# Patient Record
Sex: Female | Born: 1955 | Race: White | Hispanic: No | Smoking: Former smoker
Health system: Southern US, Community
[De-identification: ages and names within clinical notes are randomized; demographics above are authoritative.]

## PROBLEM LIST (undated history)

## (undated) DIAGNOSIS — I10 Essential (primary) hypertension: Secondary | ICD-10-CM

## (undated) DIAGNOSIS — K219 Gastro-esophageal reflux disease without esophagitis: Secondary | ICD-10-CM

## (undated) DIAGNOSIS — E538 Deficiency of other specified B group vitamins: Secondary | ICD-10-CM

## (undated) DIAGNOSIS — I5181 Takotsubo syndrome: Secondary | ICD-10-CM

## (undated) DIAGNOSIS — C539 Malignant neoplasm of cervix uteri, unspecified: Secondary | ICD-10-CM

## (undated) DIAGNOSIS — M1990 Unspecified osteoarthritis, unspecified site: Secondary | ICD-10-CM

## (undated) DIAGNOSIS — G43909 Migraine, unspecified, not intractable, without status migrainosus: Secondary | ICD-10-CM

## (undated) DIAGNOSIS — F419 Anxiety disorder, unspecified: Secondary | ICD-10-CM

## (undated) DIAGNOSIS — K5090 Crohn's disease, unspecified, without complications: Secondary | ICD-10-CM

## (undated) DIAGNOSIS — F329 Major depressive disorder, single episode, unspecified: Secondary | ICD-10-CM

## (undated) DIAGNOSIS — G459 Transient cerebral ischemic attack, unspecified: Secondary | ICD-10-CM

## (undated) DIAGNOSIS — F32A Depression, unspecified: Secondary | ICD-10-CM

## (undated) DIAGNOSIS — A630 Anogenital (venereal) warts: Secondary | ICD-10-CM

## (undated) DIAGNOSIS — L409 Psoriasis, unspecified: Secondary | ICD-10-CM

## (undated) DIAGNOSIS — K760 Fatty (change of) liver, not elsewhere classified: Secondary | ICD-10-CM

## (undated) DIAGNOSIS — Z8619 Personal history of other infectious and parasitic diseases: Secondary | ICD-10-CM

## (undated) DIAGNOSIS — Z720 Tobacco use: Secondary | ICD-10-CM

## (undated) DIAGNOSIS — Z862 Personal history of diseases of the blood and blood-forming organs and certain disorders involving the immune mechanism: Secondary | ICD-10-CM

## (undated) DIAGNOSIS — H903 Sensorineural hearing loss, bilateral: Secondary | ICD-10-CM

## (undated) HISTORY — DX: Transient cerebral ischemic attack, unspecified: G45.9

## (undated) HISTORY — DX: Unspecified osteoarthritis, unspecified site: M19.90

## (undated) HISTORY — DX: Anxiety disorder, unspecified: F41.9

## (undated) HISTORY — DX: Anogenital (venereal) warts: A63.0

## (undated) HISTORY — DX: Psoriasis, unspecified: L40.9

## (undated) HISTORY — DX: Personal history of other infectious and parasitic diseases: Z86.19

## (undated) HISTORY — DX: Sensorineural hearing loss, bilateral: H90.3

## (undated) HISTORY — DX: Malignant neoplasm of cervix uteri, unspecified: C53.9

## (undated) HISTORY — DX: Major depressive disorder, single episode, unspecified: F32.9

## (undated) HISTORY — DX: Deficiency of other specified B group vitamins: E53.8

## (undated) HISTORY — DX: Essential (primary) hypertension: I10

## (undated) HISTORY — DX: Migraine, unspecified, not intractable, without status migrainosus: G43.909

## (undated) HISTORY — DX: Fatty (change of) liver, not elsewhere classified: K76.0

## (undated) HISTORY — DX: Gastro-esophageal reflux disease without esophagitis: K21.9

## (undated) HISTORY — DX: Takotsubo syndrome: I51.81

## (undated) HISTORY — PX: KNEE SURGERY: SHX244

## (undated) HISTORY — DX: Personal history of diseases of the blood and blood-forming organs and certain disorders involving the immune mechanism: Z86.2

## (undated) HISTORY — DX: Depression, unspecified: F32.A

## (undated) HISTORY — DX: Crohn's disease, unspecified, without complications: K50.90

---

## 1996-08-29 HISTORY — PX: APPENDECTOMY: SHX54

## 1998-08-29 DIAGNOSIS — K5090 Crohn's disease, unspecified, without complications: Secondary | ICD-10-CM

## 1998-08-29 HISTORY — PX: RIGHT COLECTOMY: SHX853

## 1998-08-29 HISTORY — DX: Crohn's disease, unspecified, without complications: K50.90

## 2006-06-21 ENCOUNTER — Emergency Department (HOSPITAL_COMMUNITY): Admission: EM | Admit: 2006-06-21 | Discharge: 2006-06-22 | Payer: Self-pay | Admitting: Emergency Medicine

## 2007-08-10 ENCOUNTER — Encounter: Admission: RE | Admit: 2007-08-10 | Discharge: 2007-08-10 | Payer: Self-pay | Admitting: Gastroenterology

## 2007-09-30 ENCOUNTER — Emergency Department (HOSPITAL_COMMUNITY): Admission: EM | Admit: 2007-09-30 | Discharge: 2007-09-30 | Payer: Self-pay | Admitting: Emergency Medicine

## 2007-10-04 ENCOUNTER — Encounter: Admission: RE | Admit: 2007-10-04 | Discharge: 2007-10-04 | Payer: Self-pay | Admitting: Gastroenterology

## 2007-12-31 ENCOUNTER — Inpatient Hospital Stay (HOSPITAL_COMMUNITY): Admission: EM | Admit: 2007-12-31 | Discharge: 2008-01-06 | Payer: Self-pay | Admitting: Emergency Medicine

## 2008-04-03 ENCOUNTER — Encounter: Admission: RE | Admit: 2008-04-03 | Discharge: 2008-04-03 | Payer: Self-pay | Admitting: Family Medicine

## 2008-10-27 HISTORY — PX: COLONOSCOPY: SHX174

## 2008-10-27 HISTORY — PX: CERVICAL BIOPSY  W/ LOOP ELECTRODE EXCISION: SUR135

## 2008-11-20 ENCOUNTER — Ambulatory Visit: Payer: Self-pay | Admitting: Gynecology

## 2008-11-26 ENCOUNTER — Ambulatory Visit: Payer: Self-pay | Admitting: Gynecology

## 2008-11-26 ENCOUNTER — Encounter: Payer: Self-pay | Admitting: Gynecology

## 2008-12-09 ENCOUNTER — Ambulatory Visit: Payer: Self-pay | Admitting: Gynecology

## 2008-12-23 ENCOUNTER — Ambulatory Visit: Payer: Self-pay | Admitting: Gynecology

## 2008-12-26 ENCOUNTER — Ambulatory Visit: Payer: Self-pay | Admitting: Gynecology

## 2008-12-27 DIAGNOSIS — C539 Malignant neoplasm of cervix uteri, unspecified: Secondary | ICD-10-CM

## 2008-12-27 HISTORY — PX: VAGINAL HYSTERECTOMY: SUR661

## 2008-12-27 HISTORY — DX: Malignant neoplasm of cervix uteri, unspecified: C53.9

## 2009-01-02 ENCOUNTER — Ambulatory Visit: Payer: Self-pay | Admitting: Gynecology

## 2009-01-06 ENCOUNTER — Ambulatory Visit: Payer: Self-pay | Admitting: Gynecology

## 2009-01-06 ENCOUNTER — Encounter: Payer: Self-pay | Admitting: Gynecology

## 2009-01-06 ENCOUNTER — Ambulatory Visit (HOSPITAL_BASED_OUTPATIENT_CLINIC_OR_DEPARTMENT_OTHER): Admission: RE | Admit: 2009-01-06 | Discharge: 2009-01-07 | Payer: Self-pay | Admitting: Gynecology

## 2009-01-21 ENCOUNTER — Ambulatory Visit: Payer: Self-pay | Admitting: Gynecology

## 2009-02-19 ENCOUNTER — Ambulatory Visit: Payer: Self-pay | Admitting: Gynecology

## 2009-03-17 ENCOUNTER — Ambulatory Visit: Payer: Self-pay | Admitting: Gynecology

## 2009-05-20 ENCOUNTER — Encounter: Admission: RE | Admit: 2009-05-20 | Discharge: 2009-05-20 | Payer: Self-pay | Admitting: Internal Medicine

## 2009-09-21 ENCOUNTER — Emergency Department (HOSPITAL_COMMUNITY): Admission: EM | Admit: 2009-09-21 | Discharge: 2009-09-21 | Payer: Self-pay | Admitting: Family Medicine

## 2010-01-08 ENCOUNTER — Ambulatory Visit: Payer: Self-pay | Admitting: Gynecology

## 2010-05-29 DIAGNOSIS — G459 Transient cerebral ischemic attack, unspecified: Secondary | ICD-10-CM

## 2010-05-29 HISTORY — DX: Transient cerebral ischemic attack, unspecified: G45.9

## 2010-05-29 HISTORY — PX: OTHER SURGICAL HISTORY: SHX169

## 2010-05-29 HISTORY — PX: US ECHOCARDIOGRAPHY: HXRAD669

## 2010-06-09 ENCOUNTER — Inpatient Hospital Stay (HOSPITAL_COMMUNITY): Admission: EM | Admit: 2010-06-09 | Discharge: 2010-06-11 | Payer: Self-pay | Admitting: Emergency Medicine

## 2010-06-10 ENCOUNTER — Encounter (INDEPENDENT_AMBULATORY_CARE_PROVIDER_SITE_OTHER): Payer: Self-pay | Admitting: Internal Medicine

## 2010-08-25 ENCOUNTER — Other Ambulatory Visit
Admission: RE | Admit: 2010-08-25 | Discharge: 2010-08-25 | Payer: Self-pay | Source: Home / Self Care | Admitting: Gynecology

## 2010-08-25 ENCOUNTER — Ambulatory Visit: Payer: Self-pay | Admitting: Gynecology

## 2010-09-16 ENCOUNTER — Ambulatory Visit: Admit: 2010-09-16 | Payer: Self-pay | Admitting: Gynecology

## 2010-11-11 LAB — CBC
HCT: 32.7 % — ABNORMAL LOW (ref 36.0–46.0)
Hemoglobin: 11.3 g/dL — ABNORMAL LOW (ref 12.0–15.0)
Hemoglobin: 11.3 g/dL — ABNORMAL LOW (ref 12.0–15.0)
Hemoglobin: 11.3 g/dL — ABNORMAL LOW (ref 12.0–15.0)
MCH: 31.9 pg (ref 26.0–34.0)
MCH: 32.1 pg (ref 26.0–34.0)
MCHC: 33.7 g/dL (ref 30.0–36.0)
MCHC: 34.1 g/dL (ref 30.0–36.0)
MCV: 92.9 fL (ref 78.0–100.0)
Platelets: 146 10*3/uL — ABNORMAL LOW (ref 150–400)
Platelets: 151 10*3/uL (ref 150–400)
Platelets: 167 10*3/uL (ref 150–400)
RBC: 3.45 MIL/uL — ABNORMAL LOW (ref 3.87–5.11)
RBC: 3.52 MIL/uL — ABNORMAL LOW (ref 3.87–5.11)
WBC: 3.9 10*3/uL — ABNORMAL LOW (ref 4.0–10.5)

## 2010-11-11 LAB — RETICULOCYTES: Retic Ct Pct: 0.9 % (ref 0.4–3.1)

## 2010-11-11 LAB — BASIC METABOLIC PANEL
CO2: 23 mEq/L (ref 19–32)
CO2: 26 mEq/L (ref 19–32)
Calcium: 8.3 mg/dL — ABNORMAL LOW (ref 8.4–10.5)
Chloride: 109 mEq/L (ref 96–112)
Chloride: 110 mEq/L (ref 96–112)
Creatinine, Ser: 0.72 mg/dL (ref 0.4–1.2)
Creatinine, Ser: 0.78 mg/dL (ref 0.4–1.2)
GFR calc Af Amer: >60 mL/min (ref >60)
GFR calc non Af Amer: >60 mL/min (ref >60)
Potassium: 3.5 mEq/L (ref 3.5–5.1)
Potassium: 4.1 mEq/L (ref 3.5–5.1)

## 2010-11-11 LAB — COMPREHENSIVE METABOLIC PANEL
ALT: 26 U/L (ref 0–35)
AST: 29 U/L (ref 0–37)
Albumin: 3 g/dL — ABNORMAL LOW (ref 3.5–5.2)
Albumin: 3 g/dL — ABNORMAL LOW (ref 3.5–5.2)
Alkaline Phosphatase: 62 U/L (ref 39–117)
BUN: 6 mg/dL (ref 6–23)
Calcium: 8 mg/dL — ABNORMAL LOW (ref 8.4–10.5)
Creatinine, Ser: 0.69 mg/dL (ref 0.4–1.2)
Creatinine, Ser: 0.7 mg/dL (ref 0.4–1.2)
GFR calc Af Amer: >60 mL/min (ref >60)
Glucose, Bld: 90 mg/dL (ref 70–99)
Sodium: 139 mEq/L (ref 135–145)
Total Bilirubin: 0.6 mg/dL (ref 0.3–1.2)
Total Protein: 5.7 g/dL — ABNORMAL LOW (ref 6.0–8.3)
Total Protein: 5.8 g/dL — ABNORMAL LOW (ref 6.0–8.3)

## 2010-11-11 LAB — TROPONIN I: Troponin I: 0.01 ng/mL (ref 0.00–0.06)

## 2010-11-11 LAB — URINALYSIS, ROUTINE W REFLEX MICROSCOPIC
Glucose, UA: NEGATIVE mg/dL
Hgb urine dipstick: NEGATIVE
Ketones, ur: NEGATIVE mg/dL
Protein, ur: NEGATIVE mg/dL
pH: 7.5 (ref 5.0–8.0)

## 2010-11-11 LAB — IRON AND TIBC
Saturation Ratios: 30 % (ref 20–55)
UIBC: 191 ug/dL

## 2010-11-11 LAB — DIFFERENTIAL
Eosinophils Absolute: 0.1 10*3/uL (ref 0.0–0.7)
Eosinophils Relative: 1 % (ref 0–5)
Lymphocytes Relative: 35 % (ref 12–46)
Lymphs Abs: 1.4 10*3/uL (ref 0.7–4.0)
Monocytes Absolute: 0.4 10*3/uL (ref 0.1–1.0)
Monocytes Relative: 10 % (ref 3–12)

## 2010-11-11 LAB — PROTIME-INR: Prothrombin Time: 14.4 seconds (ref 11.6–15.2)

## 2010-11-11 LAB — LIPID PANEL
LDL Cholesterol: 88 mg/dL (ref 0–99)
Total CHOL/HDL Ratio: 3.1 RATIO
Triglycerides: 65 mg/dL (ref <150)
VLDL: 13 mg/dL (ref 0–40)

## 2010-11-11 LAB — FOLATE: Folate: 15.5 ng/mL

## 2010-11-11 LAB — CK TOTAL AND CKMB (NOT AT ARMC)
CK, MB: 1.1 ng/mL (ref 0.3–4.0)
Total CK: 88 U/L (ref 7–177)

## 2010-11-11 LAB — HEMOGLOBIN A1C
Hgb A1c MFr Bld: 5.2 % (ref <5.7)
Mean Plasma Glucose: 103 mg/dL (ref <117)

## 2010-11-11 LAB — VITAMIN B12: Vitamin B-12: 634 pg/mL (ref 211–911)

## 2010-11-11 LAB — FERRITIN: Ferritin: 31 ng/mL (ref 10–291)

## 2010-11-11 LAB — MAGNESIUM: Magnesium: 1.7 mg/dL (ref 1.5–2.5)

## 2010-11-11 LAB — APTT: aPTT: 32 seconds (ref 24–37)

## 2010-11-11 LAB — HEMOCCULT GUIAC POC 1CARD (OFFICE): Fecal Occult Bld: NEGATIVE

## 2010-12-07 LAB — CBC
MCV: 100.8 fL — ABNORMAL HIGH (ref 78.0–100.0)
RBC: 3 MIL/uL — ABNORMAL LOW (ref 3.87–5.11)
WBC: 3.8 10*3/uL — ABNORMAL LOW (ref 4.0–10.5)

## 2010-12-07 LAB — DIFFERENTIAL
Eosinophils Absolute: 0 10*3/uL (ref 0.0–0.7)
Lymphocytes Relative: 24 % (ref 12–46)
Lymphs Abs: 0.9 10*3/uL (ref 0.7–4.0)
Monocytes Relative: 7 % (ref 3–12)
Neutrophils Relative %: 68 % (ref 43–77)

## 2011-01-10 ENCOUNTER — Other Ambulatory Visit: Payer: Self-pay | Admitting: Gastroenterology

## 2011-01-10 DIAGNOSIS — K5090 Crohn's disease, unspecified, without complications: Secondary | ICD-10-CM

## 2011-01-11 NOTE — Discharge Summary (Signed)
Sharon [name], Sharon [name]              ACCOUNT NO.:  [id number]   MEDICAL RECORD NO.:  [id number]          PATIENT TYPE:  INP   LOCATION:  2703                         FACILITY:  [facility]   PHYSICIAN:  Barbette Merino, M.D.      DATE OF BIRTH:  1956-08-13   DATE OF ADMISSION:  12/31/2007  DATE OF DISCHARGE:  01/06/2008                               DISCHARGE SUMMARY   PRIMARY CARE PHYSICIAN:  Chipper Herb, MD   GASTROENTEROLOGIST:  Joyice Faster. [name], MD   DISCHARGE DIAGNOSES:  1. Crohn disease exacerbation.  2. Hypertension.  3. Anxiety disorder.  4. Depression.  5. Gastroesophageal reflux disease.  6. Transient hypoglycemia, steroid induced.   DISCHARGE MEDICATIONS:  1. Asacol 1200 mg p.o. b.i.d.  2. Celexa 40 mg daily.  3. Prevacid 30 mg daily.  4. Entocort 9 mg tablet, 1 tablet daily.  5. 6-mercaptopurine 1/2 tablets daily.  6. MiraLax 17 grams in 8 ounces of water daily.  7. Ativan 0.5 mg q.8 h. p.r.n.   DISPOSITION:  The patient will be discharged home.  She is to follow  with Dr. Laurence Spates as an outpatient and further treatment will be  instituted then.   PROCEDURES PERFORMED:  An abdominal x-ray performed on May 4 shows no  active cardiopulmonary disease, abdominal gas pattern most consistent  with nonobstructive ileus.  CT abdomen and pelvis performed on May 4  consistent with wall thickening involving the distal small bowel and  duodenal bulb findings, probably representing an exacerbation of Crohn  disease but no evidence for obstruction.  There is a tiny amount of  fluid in the right lower quadrant of the abdomen.  A small-bowel follow-  through on Jan 04, 2008 showed findings consistent with Crohn disease  involving distal small bowel without fistula identified.  A KUB  performed on May 9 showed contrast administered prior to this all the  way to the rectum.  An EGD performed by Dr. Teena Irani on Jan 02, 2008  showed a normal study.   CONSULTATIONS:  John C.  Amedeo Plenty, MD, gastroenterologist.   BRIEF HISTORY AND PHYSICAL:  Please refer the dictated history and  physical on admission by Dr. Donia Ast.  In short, however, this is a  55 year old female with known history of Crohn disease status post  resection.  She is being followed by Tanna Furry given with abdominal  pain, nausea, and vomiting for about a day.  The patient was sent to the  ED.  At that time, her blood pressure was 171/86.  She has guaiac  positive stools but her hemoglobin was 10.4.  Otherwise, no other  significant findings.   HOSPITAL COURSE:  1. Acute flare of Crohn's.  The patient's evaluation was consistent      with Crohn disease exacerbation.  She was started on high-dose IV      steroids and studies were conducted as indicated above.      Ultimately, the diagnosis of Crohn's flare was made.  She improved      on IV steroids.  Later on, she was titrated and switched back  to      oral steroids and Entocort which she will continue on.  Other      medications included 6-mercaptopurine that were continued as well.  2. Constipation.  The patient had transient constipation after her      Crohn's flare was resolved.  Currently, that has resolved.  3. Hypertension.  Blood pressure has improved on home regimen      therapies.  She will probably require further treatment as an      outpatient, may be with an ACE inhibitor.  4. Hypoglycemia.  This was transient also after IV steroids were      withdrawn.  The patient's sugar is currently stable.  Otherwise,      the patient is doing okay and we will proceed with discharge home.      Barbette Merino, M.D.  Electronically Signed     LG/MEDQ  D:  01/06/2008  T:  01/06/2008  Job:  [id number]

## 2011-01-11 NOTE — H&P (Signed)
Sharon [name], Sharon [name]              ACCOUNT NO.:  [id number]   MEDICAL RECORD NO.:  [id number]          PATIENT TYPE:  AMB   LOCATION:  [facility]                         FACILITY:  [facility]   PHYSICIAN:  Juan H. Toney Rakes, M.D.DATE OF BIRTH:  1956/08/01   DATE OF ADMISSION:  01/06/2009  DATE OF DISCHARGE:                              HISTORY & PHYSICAL   The patient is scheduled for surgery Tuesday, May 11th, at 7:30 a.m., at  [facility]   CHIEF COMPLAINT:  1. Postmenopausal bleeding.  2. 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   HISTORY:  The patient is a 55 year old gravida 4, para 3, AB 1 who was  evaluated because of an abnormal Pap smear that had been done in her  family nurse practitioner's office whereby her Pap smear had  demonstrated atypical squamous cells of undetermined significance.  Her  Pap smear had been repeated on November 03, 2008, demonstrated low-grade  squamous epithelial lesion with HPV viral changes.  The patient had  subsequently undergone a colposcopic evaluation in the office on March  25th with no gross lesion noted, an ECC had demonstrated atypical  squamous cells and endocervical cells with acute inflammation.  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  The patient was quite anxious as a result of this high-grade dysplasia  extending into the cervical canal and also a few days later had  postmenopausal bleeding and underwent an endometrial biopsy which  demonstrated benign endometrium with extensive breakdown and difficult  to evaluate for the presence of hyperplasia.  The patient is scheduled  to undergo a  laparoscopic-assisted hysterectomy with bilateral salpingo-  oophorectomy and the laparoscopic approach is being undertaken due to  the fact that she has a history of regional enteritis of the small  intestine, with large intestine as well, and has a history of pernicious  anemia.  She also has had Crohn's disease and resulting in ileocecal  resection and had been followed by her gastroenterologist, Dr. [name],  who had performed a colonoscopy in March 2010.  He had noted on his  report that the anastomotic site looked a bit inflamed but biopsies that  were undertaken had shown no evidence of active disease.  So, because of  her history of bowel resection and appendectomy in the past and history  of Crohn's disease, this is the reason that we are proceeding with a  laparoscopic-assisted vaginal hysterectomy and bilateral salpingo-  oophorectomy.   PAST MEDICAL HISTORY:  1. She is allergic to:      a.     COD.      b.     Flagyl.  2. She has a history of herpes type 2.  3. She had history of CIN-1, CIN-2.  4. Crohn's disease.   SURGERIES:  Have consisted of:  1. Appendectomy  in 1988.  2. Bowel resection in 2000.  3. LEEP cervical conization in March 2010.   MEDICATIONS:  Consist of Asacol, Celexa, Entocort, lorazepam, Valtrex,  Ambien p.r.n. and 6-MP.   FAMILY HISTORY:  Mother with hypertension and sister with either ovarian  or uterine cancer reported.   PHYSICAL EXAMINATION:  The patient weighs 134 pounds, she is 5 feet 2  inches tall.  Blood pressure 130/80.  HEENT:  Unremarkable.  NECK:  Supple.  Trachea midline.  No carotid bruits, no thyromegaly.  LUNGS:  Clear to auscultation.  No rhonchi or wheezes.  HEART:  Regular rate and rhythm.  No murmurs or gallop.  BREASTS;  Done March of this year with no palpable masses or tenderness,  there was no supraclavicular or axillary lymphadenopathy.  ABDOMEN:  Soft, nontender.  No rebound or guarding.  PELVIC:  Bartholin, urethra,  Skene's within normal limits.  VAGINA/CERVIX:  No gross lesions on inspection.  Evidence of prior  cervical conization was noted.  Uterus anteverted, upper limits of  normal.  Adnexa without any palpable masses or tenderness.  RECTAL:  Deferred.   ASSESSMENT:  A 55 year old gravida 4, para 3, AB 1 with postmenopausal  bleeding and high-grade dysplasia extending into the underlying  endocervical canal.  The patient with a history of Crohn's disease,  history of bowel resection in the past and appendectomy.  Scheduled to  undergo a laparoscopic-assisted hysterectomy with bilateral salpingo-  oophorectomy.  The risks, benefits and pros and cons of the operation  were discussed with the patient.  She will have PSA stockings for deep  vein thrombosis prophylaxis.  We discussed the risk of infection and for  this reason she will have prophylaxis antibiotic as well.  Also, there  is risk of trauma or injury to internal organs requiring open laparotomy  and emergency exploration and correction of any injury to the bowel,  bladder or blood vessels.  Also in the event of any technical difficulty  that the operation cannot be able to be completed laparoscopically, we  will proceed then with an open abdominal approach.  The patient is fully  aware of all of these risks in the event of any uncontrollable  hemorrhage, if she were to need blood or blood products.  She is fully  aware also of the risk of anaphylactic reaction, hepatitis and AIDS from  donor blood.  All of these issues were discussed in detail with the  patient, all questions were answered and will follow accordingly.   The patient is scheduled for laparoscopic-assisted vaginal hysterectomy  Tuesday, May 11th, at 7:30 a.m., at [facility]. Please  have history and physical available.      Juan H. Toney Rakes, M.D.  Electronically Signed     JHF/MEDQ  D:  01/05/2009  T:  01/05/2009  Job:  [id number]

## 2011-01-11 NOTE — Op Note (Signed)
Sharon [name], Sharon [name]              ACCOUNT NO.:  [id number]   MEDICAL RECORD NO.:  [id number]          PATIENT TYPE:  INP   LOCATION:  5531                         FACILITY:  [facility]   PHYSICIAN:  John C. Amedeo Plenty, M.D.    DATE OF BIRTH:  1956/07/30   DATE OF PROCEDURE:  01/02/2008  DATE OF DISCHARGE:                               OPERATIVE REPORT   INDICATIONS FOR PROCEDURE:  This is a patient with Crohn disease who has  had a flare of abdominal pain, nausea, vomiting, diarrhea with CT scan  showing thickened duodenum as well as some distal small bowel  thickening.  She has used considerable amounts of nonsteroidal anti-  inflammatory drugs in the past.   PROCEDURE:  The patient was placed in the left lateral decubitus  position and placed on the pulse monitor with continuous low-flow oxygen  delivered via nasal cannula.  She was sedated with 100 mcg IV fentanyl  and 10 mg IV Versed.  The Olympus video endoscope was advanced under  direct vision into the oropharynx and esophagus.  The esophagus was  straight of normal caliber with squamocolumnar line at 38 cm.  There was  no visible hiatal hernia, ring, stricture, or other abnormalities of the  GE junction.  The stomach was entered and a small amount of liquid  secretions were suctioned from the fundus.  Retroflex view of the cardia  was unremarkable.  The fundus, body, antrum, and pylorus all appeared  normal.  The duodenum was entered.  Both bulb and second portion well  inspected and appeared to be within normal limits.  Scope was then  withdrawn and the patient returned to the recovery room in stable  condition.  She tolerated the procedure well.  There were no immediate  complications.   IMPRESSION:  Normal study.   PLAN:  We will try advancing diet and if symptoms do not improve, we  will consider small bowel series or CT enterography at the end of the  course of corticosteroids.           ______________________________  Elyse Jarvis Amedeo Plenty, M.D.     JCH/MEDQ  D:  01/02/2008  T:  01/03/2008  Job:  [id number]   cc:   Jeneen Rinks L. Rolla Flatten., M.D.

## 2011-01-11 NOTE — Op Note (Signed)
[name], [name]              ACCOUNT NO.:  [id number]   MEDICAL RECORD NO.:  [id number]          PATIENT TYPE:  AMB   LOCATION:  [facility]                         FACILITY:  [facility]   PHYSICIAN:  Sharon [name], M.D.DATE OF BIRTH:  19-Aug-1956   DATE OF PROCEDURE:  DATE OF DISCHARGE:                               OPERATIVE REPORT   FIRST ASSISTANT:  Timothy P. Fontaine, M.D.   INDICATIONS FOR OPERATION:  This 56 year old gravida 4, para 3, AB 1  with postmenopausal bleeding and high-grade cervical dysplasia extending  into the endocervical canal.   PREOPERATIVE DIAGNOSES:  1. Postmenopausal bleeding.  2. 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   POSTOPERATIVE DIAGNOSIS:  1. Postmenopausal bleeding.  2. 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   ANESTHESIA:  General endotracheal anesthesia.   PROCEDURE PERFORMED:  Laparoscopic-assisted vaginal hysterectomy  bilateral salpingo-oophorectomy.   FINDINGS:  Normal uterus, cervix, tubes, ovary, and normal-appearing  pelvis.   DESCRIPTION OF OPERATION:  After the patient was adequately counseled,  she was taken to the operating room where she underwent successful  general endotracheal anesthesia for DVT prophylaxis.  The patient had  PSA stockings and also for infection prophylaxis she received a gram of  cefoxitin IV.  The abdomen and vagina were prepped and draped in usual  sterile fashion.  Bimanual examination demonstrated anteverted uterus.  A Hulka tenaculum was placed for manipulation of the uterus to the  laparoscopic portion of the procedure and a Foley catheter was inserted  to monitor urinary output.  After the drapes were in place, a small  vertical incision was made in the umbilicus followed by insertion of the  10-mm OptiVu trocar a pneumoperitoneum was established with  approximately 2.5 to 3 liters of carbon dioxide.  The patient was placed  in Trendelenburg position to  additional 5-mm portion of placed in the  right and left respectively of the patient's lower abdomen under  laparoscopic guidance.  Attention was placed in the entire pelvis for a  systematic inspection and the anterior procedure cul-de-sac with no  endometriosis or adhesions.  Both tube and ovaries appeared to be  normal, although slightly atrophic.  No other abnormality was noted.  The right ureter was identified.  The right tube and ovary were placed  on detention.  The right infundibulopelvic ligament was coapted in two  areas close to the ovary and then transected with the harmonic scalpel.  This was undertaken all the way to completely transect the right  infundibulopelvic ligament through the mesosalpinx and coapting and  transecting the round ligament.  The broad ligament was coapted and  transected all the way down to the cardinal ligament.  The bladder flap  was established again using the sharp end of the harmonic scalpel to the  midline at the lower uterine segment.  Similar procedure was carried out  on the contralateral side.  Attention was then focused on the second  part, the operation where we went transvaginally.  The patient's legs  were placed in high lithotomy position and the Hulka tenaculum was  removed.  The short weighted  speculum was placed in the vaginal vault.  Two Lahey clamps were placed, one on the anterior and one at posterior  cervical lip, and 2% lidocaine with 1:100,000 epinephrine was  infiltrated cervicovaginal fold circumferentially for approximately 15  mL in a circumferential fashion through the cervicovaginal fold.  An  incision was made circumferentially.  The posterior cul-de-sac was  entered.  The short weighted bill speculum was changed for a long-  weighted bill speculum.  Both uterosacral ligaments were serially  clamped, cut, and suture ligated with 6-0 Vicryl suture intact.  The  peritoneum was peeled off the lower uterine segment and the  anterior cul-  de-sac was entered safely.  The deeper retractor was inserted.  The  remaining cardinal and broad ligaments were serially clamped, cut, and  suture ligated with 0 Vicryl suture and the remaining pedicles on each  side were clamped and cut and the uterus, cervix, tubes, and ovaries  were passed off the operative field.  The remaining pedicles were free  tied with 0 Vicryl suture.  The pelvis was copiously irrigated with  normal saline solution.  The posterior vaginal cuff was run with a  running stitch of 0 Vicryl suture and the vaginal cuff was then closed  with interrupted sutures of 0 Vicryl suture.  The vagina was copiously  irrigated with normal saline solution once again.  The legs were then  brought down and the pneumoperitoneum was reestablished.  Inspection of  the pelvic cavity demonstrated both infundibulopelvic ligament pedicles  were dry.  The pelvic cavity was copiously irrigated with normal saline  solution.  Adequate hemostasis was evident for additional hemostasis.  Surgicel was placed at the vaginal cuff.  Pre and post procedure  pictures were obtained.  The pneumoperitoneum was removed.  No  additional depression.  No bleeding was noted.  Once the  pneumoperitoneum was removed, the subumbilical fascia was closed with a  figure-of-eight of 0 Vicryl suture and all three port site was  reapproximated with Dermabond glue and for postoperative analgesia 0.25%  Marcaine was infiltrated all three port for a total 10 mL.  The patient  was extubated and transferred to recovery with stable vital signs.  Blood loss was 100 mL.  IV fluids 1500 mL of lactated Ringer's.  Urine  output 100 mL.      Sharon [name], M.D.  Electronically Signed     JHF/MEDQ  D:  01/06/2009  T:  01/07/2009  Job:  [id number]

## 2011-01-11 NOTE — H&P (Signed)
Sharon [name], RABON              ACCOUNT NO.:  [id number]   MEDICAL RECORD NO.:  [id number]          PATIENT TYPE:  INP   LOCATION:  5009                         FACILITY:  [facility]   PHYSICIAN:  Hind I Elsaid, MD      DATE OF BIRTH:  1956-05-31   DATE OF ADMISSION:  12/31/2007  DATE OF DISCHARGE:                              HISTORY & PHYSICAL   PRIMARY CARE PHYSICIAN:  Chipper Herb, M.D.   PRIMARY GASTROENTEROLOGIST:  Joyice Faster. [name], M.D.   CHIEF COMPLAINT:  Abdominal pain, nausea, and vomiting for 1 day.   HISTORY OF PRESENT ILLNESS:  This is a 55 year old Caucasian female who  has a history of Crohn disease since 1999, Crohn's being followed by Dr.  Laurence Spates, presented today with crampy abdominal pain mainly at the  epigastric area.  Pain was 9/10, crampy in nature, not radiating,  associated with vomiting and diarrhea.  The patient almost vomits 6  times mainly yellow bile fluid.  Condition also associated with watery  diarrhea about 6 times.  The patient denies any tenesmus, but denies any  bloody diarrhea and denies any hematemesis or hemoptysis.  The patient  denies any fever.  The patient denies any chest pain or shortness of  breath.   PAST MEDICAL HISTORY:  1. Crohn disease.  2. Anxiety disorder.  3. Hypertension.   ALLERGIES:  CODEINE.   MEDICATIONS:  1. Celexa 40 mg p.o. daily.  2. Asacol 1200 mg twice daily.  3. Prevacid 30 mg daily.  4. Entocort, dose unknown, but every other day.  5. Mercaptopurine 1-1/2 dose.  Placed on medication reconciliation.      Tried to contact the patient's pharmacy, but pharmacy closed at      this time.   PAST SURGICAL HISTORY:  1. Status post bowel resection.  2. Appendectomy.   REVIEW OF SYSTEMS:  The patient denies any chest pain or palpitation or  shortness of breath.  The patient complains of headache.  No numbness or  weakness of extremity.  No burning micturition.  No abnormal skin rash.   SOCIAL HISTORY:   The patient is single.  Has 3 grown children.  She  works as a Pharmacist, hospital.  Denies any smoking, denies any alcohol abuse, and  denies any IV drug abuse.   PHYSICAL EXAMINATION:  VITAL SIGNS:  Temperature 98.8, blood pressure  171/86, pulse rate 92, respiratory rate 20, and saturating 100% on room  air.  HEENT:  Normocephalic, atraumatic.  Pupils equal, reactive to light and  accommodation, and extraocular muscle movements within normal.  NECK:  Supple.  No JVD.  No thyromegaly.  HEART:  S1 and S2 with no added sound.  LUNG:  Normal with a clear breathing with equal air entry.  ABDOMEN:  Soft, tenderness at the epigastric area and the mid umbilicus  area.  Bowel sounds positive.  No organomegaly.  No rebound tenderness  or guarding.  EXTREMITIES:  Peripheral pulses intact.  No cyanosis.  No lower  extremity edema.  CNS:  The patient is alert and oriented x3.  No focal neurological  findings.   LABORATORY DATA:  CT abdomen and pelvis shows wall thickening involving  the distal small bowel and duodenal bulb to present exacerbation of  Crohn disease.  No evidence of obstruction.  There is slight amount of  fluid in the right lower quadrant of the abdomen.  No acute pelvic  findings.   Lipase 27, CMP:  Sodium 142, potassium 3.3, chloride 108, CO2 23,  glucose 119, BUN 10, and creatinine 0.67.  Total bilirubin 1.1, alkaline  phosphatase 66, AST 28, ALT 16, total protein 6.9, albumin is 3.8, and  calcium 9.1.  White blood cell 7.4, hemoglobin 13.4, hematocrit 37.9,  and platelets 250.   ASSESSMENT:  1. Crohn exacerbation.  2. Hypokalemia.   PLAN:  1. The patient would be admitted to med/surg floor, keep n.p.o. as an      outpatient on IV fluid.  We will start the patient on IV Solu-      Medrol and Asacol.  We will consult Gastroenterology in the      morning.  We will start the patient on IV Protonix and Carafate.  2. Hypokalemia replacement, DVT and GI prophylaxis.   Further  recommendation to be addressed as hospital course progresses.      Hind Franco Collet, MD  Electronically Signed     HIE/MEDQ  D:  12/31/2007  T:  01/01/2008  Job:  [id number]

## 2011-01-14 ENCOUNTER — Other Ambulatory Visit: Payer: Self-pay

## 2011-01-14 ENCOUNTER — Ambulatory Visit
Admission: RE | Admit: 2011-01-14 | Discharge: 2011-01-14 | Disposition: A | Discharge status: Home / Self Care | Payer: BC Managed Care – PPO | Source: Ambulatory Visit | Attending: Gastroenterology | Admitting: Gastroenterology

## 2011-01-14 DIAGNOSIS — K5090 Crohn's disease, unspecified, without complications: Secondary | ICD-10-CM

## 2011-01-14 MED ORDER — IOHEXOL 300 MG/ML  SOLN
100.0000 mL | Freq: Once | INTRAMUSCULAR | Status: AC | PRN
  Administered 2011-01-14: 100 mL via INTRAVENOUS

## 2011-03-09 ENCOUNTER — Emergency Department (HOSPITAL_COMMUNITY): Payer: BC Managed Care – PPO

## 2011-03-09 ENCOUNTER — Emergency Department (HOSPITAL_COMMUNITY)
Admission: EM | Admit: 2011-03-09 | Discharge: 2011-03-09 | Disposition: A | Discharge status: Home / Self Care | Payer: BC Managed Care – PPO | Attending: Emergency Medicine | Admitting: Emergency Medicine

## 2011-03-09 DIAGNOSIS — J40 Bronchitis, not specified as acute or chronic: Secondary | ICD-10-CM | POA: Insufficient documentation

## 2011-03-09 DIAGNOSIS — Z79899 Other long term (current) drug therapy: Secondary | ICD-10-CM | POA: Insufficient documentation

## 2011-03-09 DIAGNOSIS — F411 Generalized anxiety disorder: Secondary | ICD-10-CM | POA: Insufficient documentation

## 2011-03-09 DIAGNOSIS — K5090 Crohn's disease, unspecified, without complications: Secondary | ICD-10-CM | POA: Insufficient documentation

## 2011-03-09 DIAGNOSIS — F17200 Nicotine dependence, unspecified, uncomplicated: Secondary | ICD-10-CM | POA: Insufficient documentation

## 2011-03-09 DIAGNOSIS — R109 Unspecified abdominal pain: Secondary | ICD-10-CM | POA: Insufficient documentation

## 2011-03-09 LAB — BASIC METABOLIC PANEL
BUN: 7 mg/dL (ref 6–23)
CO2: 28 mEq/L (ref 19–32)
Chloride: 101 mEq/L (ref 96–112)
Glucose, Bld: 83 mg/dL (ref 70–99)
Potassium: 3.3 mEq/L — ABNORMAL LOW (ref 3.5–5.1)

## 2011-03-09 LAB — DIFFERENTIAL
Eosinophils Relative: 1 % (ref 0–5)
Lymphocytes Relative: 18 % (ref 12–46)
Lymphs Abs: 1 10*3/uL (ref 0.7–4.0)
Monocytes Absolute: 0.4 10*3/uL (ref 0.1–1.0)
Monocytes Relative: 7 % (ref 3–12)

## 2011-03-09 LAB — CBC
HCT: 32.7 % — ABNORMAL LOW (ref 36.0–46.0)
Hemoglobin: 11.8 g/dL — ABNORMAL LOW (ref 12.0–15.0)
MCHC: 36.1 g/dL — ABNORMAL HIGH (ref 30.0–36.0)
MCV: 94 fL (ref 78.0–100.0)
RDW: 15.6 % — ABNORMAL HIGH (ref 11.5–15.5)

## 2011-03-09 LAB — URINALYSIS, ROUTINE W REFLEX MICROSCOPIC
Glucose, UA: NEGATIVE mg/dL
Hgb urine dipstick: NEGATIVE
Ketones, ur: NEGATIVE mg/dL
Protein, ur: NEGATIVE mg/dL
pH: 6.5 (ref 5.0–8.0)

## 2011-03-09 MED ORDER — IOHEXOL 300 MG/ML  SOLN: 80.0000 mL | Freq: Once | INTRAMUSCULAR | Status: DC | PRN

## 2011-04-30 DIAGNOSIS — K760 Fatty (change of) liver, not elsewhere classified: Secondary | ICD-10-CM | POA: Insufficient documentation

## 2011-04-30 HISTORY — DX: Fatty (change of) liver, not elsewhere classified: K76.0

## 2011-04-30 HISTORY — PX: OTHER SURGICAL HISTORY: SHX169

## 2011-05-16 ENCOUNTER — Encounter: Payer: Self-pay | Admitting: Family Medicine

## 2011-05-16 ENCOUNTER — Ambulatory Visit (INDEPENDENT_AMBULATORY_CARE_PROVIDER_SITE_OTHER): Payer: BC Managed Care – PPO | Admitting: Family Medicine

## 2011-05-16 DIAGNOSIS — I6350 Cerebral infarction due to unspecified occlusion or stenosis of unspecified cerebral artery: Secondary | ICD-10-CM

## 2011-05-16 DIAGNOSIS — Z0000 Encounter for general adult medical examination without abnormal findings: Secondary | ICD-10-CM

## 2011-05-16 DIAGNOSIS — F329 Major depressive disorder, single episode, unspecified: Secondary | ICD-10-CM | POA: Insufficient documentation

## 2011-05-16 DIAGNOSIS — I10 Essential (primary) hypertension: Secondary | ICD-10-CM | POA: Insufficient documentation

## 2011-05-16 DIAGNOSIS — F32A Depression, unspecified: Secondary | ICD-10-CM

## 2011-05-16 DIAGNOSIS — K5090 Crohn's disease, unspecified, without complications: Secondary | ICD-10-CM

## 2011-05-16 DIAGNOSIS — IMO0001 Reserved for inherently not codable concepts without codable children: Secondary | ICD-10-CM

## 2011-05-16 DIAGNOSIS — Z87891 Personal history of nicotine dependence: Secondary | ICD-10-CM | POA: Insufficient documentation

## 2011-05-16 DIAGNOSIS — G43909 Migraine, unspecified, not intractable, without status migrainosus: Secondary | ICD-10-CM

## 2011-05-16 DIAGNOSIS — F17200 Nicotine dependence, unspecified, uncomplicated: Secondary | ICD-10-CM

## 2011-05-16 DIAGNOSIS — F341 Dysthymic disorder: Secondary | ICD-10-CM

## 2011-05-16 DIAGNOSIS — G459 Transient cerebral ischemic attack, unspecified: Secondary | ICD-10-CM | POA: Insufficient documentation

## 2011-05-16 DIAGNOSIS — I639 Cerebral infarction, unspecified: Secondary | ICD-10-CM

## 2011-05-16 DIAGNOSIS — F419 Anxiety disorder, unspecified: Secondary | ICD-10-CM | POA: Insufficient documentation

## 2011-05-16 DIAGNOSIS — R030 Elevated blood-pressure reading, without diagnosis of hypertension: Secondary | ICD-10-CM

## 2011-05-16 MED ORDER — TRAMADOL HCL 50 MG PO TABS: 50.0000 mg | ORAL_TABLET | Freq: Four times a day (QID) | ORAL | Status: AC | PRN

## 2011-05-16 MED ORDER — ASPIRIN EC 81 MG PO TBEC
81.0000 mg | DELAYED_RELEASE_TABLET | Freq: Every day | ORAL | Status: DC
Start: 2011-05-16 — End: 2012-05-15

## 2011-05-16 NOTE — Patient Instructions (Addendum)
Call to schedule mammogram. Start baby aspirin, enteric coated.  Restart celexa. Good to meet you today, call us with questions. I'd like you to return fasting for blood work and afterwards for a physical. Look into counseling or being able to talk with someone about anxiety. Get flu shot and let us know if you receive it. return for physical at your convenience in next few weeks. If any stroke symptoms, go to the hospital.

## 2011-05-16 NOTE — Assessment & Plan Note (Addendum)
Stable today. Continue to monitor. Await records.

## 2011-05-16 NOTE — Progress Notes (Signed)
Subjective:    Patient ID: Sharon [name], female    DOB: [birth year], 55 y.o.   MRN: [id number]  HPI CC: new pt establish  Previously saw Delia Chimes, wanted change.  H/o anxiety and depression - on celexa since 1999, significant improvement since starting.  Also on lorazepam.  Anxiety worsening.  Worsening headaches.  Started in last several weeks.  Off celexa for last 3 days.  Pt is a Research officer, trade [employer].  Significant amt stress recently - work, Museum/gallery curator, son and daughter live with her, granddaughter who she is caring for a large amt. H/o crohn's, followed by [facility]. 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  Was started on aspirin regimen which upset stomach.  Not currently on.  Also stopped estrogen afterwards. Smoker - 1/4-1/2 ppd.  Restarted 3 yrs ago, started due to anxiety.  Has been placed on diuretic in past, but didn't help.  Last CPE - unsure.  Does get well woman exams with OBGYN Dr. Toney Rakes, has had one in last year.  Due for mammogram.  Does see GI.  Medications and allergies reviewed and updated in chart.  Past histories reviewed and updated if relevant as below. There is no problem list on file for this patient.  Past Medical History  Diagnosis Date  . Elevated blood pressure   . Migraines     and frequent other headaches (sinus or stress)  . Crohn's   . Cervical cancer 12/2008    s/p hysterectomy  . History of chicken pox   . Anxiety and depression   . History of anemia     attributed to crohn's  . Arthritis   . Genital warts   . History of syphilis 1980s  . Smoker    Past Surgical History  Procedure Date  . Appendectomy 1998  . Bowel resection 2000    crohn's disease  . Vaginal hysterectomy 2010    complete, ovaries out   History  Substance Use Topics  . Smoking status: Current Everyday Smoker -- 0.5 packs/day    Types: Cigarettes  .  Smokeless tobacco: Never Used  . Alcohol Use: No     remote EtOh use   Family History  Problem Relation Age of Onset  . Crohn's disease Daughter     crohn's, ulcerative colitis  . Stroke Mother   . Hypertension Mother   . Hyperlipidemia Mother   . Hypertension Father   . Crohn's disease Father   . Crohn's disease Sister   . Cancer Sister     ovarian  . Crohn's disease Paternal Aunt   . Crohn's disease Paternal Uncle   . Diabetes Neg Hx    Allergies  Allergen Reactions  . Codeine Nausea And Vomiting   No current outpatient prescriptions on file prior to visit.   Review of Systems  Constitutional: Positive for appetite change and unexpected weight change (weight down, appetite down). Negative for fever, chills, activity change and fatigue.  HENT: Negative for hearing loss and neck pain.   Eyes: Negative for visual disturbance.  Respiratory: Positive for cough, chest tightness and shortness of breath. Negative for wheezing.   Cardiovascular: Negative for chest pain, palpitations and leg swelling.  Gastrointestinal: Positive for nausea, vomiting, abdominal pain and diarrhea. Negative for constipation, blood in stool and abdominal distention.  Genitourinary: Negative for hematuria and difficulty urinating.  Musculoskeletal: Negative for myalgias and arthralgias.  Skin: Negative for  rash.  Neurological: Positive for dizziness, light-headedness and headaches. Negative for seizures and syncope.  Hematological: Does not bruise/bleed easily.  Psychiatric/Behavioral: Positive for dysphoric mood. The patient is nervous/anxious.        Objective:   Physical Exam  Nursing note and vitals reviewed. Constitutional: She is oriented to person, place, and time. She appears well-developed and well-nourished. No distress.  HENT:  Head: Normocephalic and atraumatic.  Right Ear: External ear normal.  Left Ear: External ear normal.  Nose: Nose normal.  Mouth/Throat: Oropharynx is clear and  moist.  Eyes: Conjunctivae and EOM are normal. Pupils are equal, round, and reactive to light.  Neck: Normal range of motion. Neck supple. No thyromegaly present.  Cardiovascular: Normal rate, regular rhythm, normal heart sounds and intact distal pulses.   No murmur heard. Pulses:      Radial pulses are 2+ on the right side, and 2+ on the left side.  Pulmonary/Chest: Effort normal and breath sounds normal. No respiratory distress. She has no wheezes. She has no rales.  Abdominal: Soft. Bowel sounds are normal. She exhibits no distension and no mass. There is no tenderness. There is no rebound and no guarding.  Musculoskeletal: Normal range of motion. She exhibits no edema.  Lymphadenopathy:    She has no cervical adenopathy.  Neurological: She is alert and oriented to person, place, and time.       CN grossly intact, station and gait intact  Skin: Skin is warm and dry. No rash noted.  Psychiatric: She has a normal mood and affect. Her behavior is normal. Judgment and thought content normal.          Assessment & Plan:

## 2011-05-16 NOTE — Assessment & Plan Note (Addendum)
Discussed worsening anxiety. Discussed importance of healthy coping strategies for anxiety. No changes for now, continue lorazepam and celexa. stopped celexa cold [place], possibly contributing to ill feeling recently, rec restart and reassess. Await records

## 2011-05-16 NOTE — Assessment & Plan Note (Signed)
Followed by GI.

## 2011-05-16 NOTE — Assessment & Plan Note (Signed)
Encouraged smoking cessation

## 2011-05-16 NOTE — Assessment & Plan Note (Signed)
Recommended start enteric coated baby aspirin.

## 2011-05-17 ENCOUNTER — Other Ambulatory Visit: Payer: Self-pay | Admitting: Family Medicine

## 2011-05-17 ENCOUNTER — Other Ambulatory Visit: Payer: Self-pay | Admitting: Gastroenterology

## 2011-05-17 DIAGNOSIS — Z1231 Encounter for screening mammogram for malignant neoplasm of breast: Secondary | ICD-10-CM

## 2011-05-18 ENCOUNTER — Other Ambulatory Visit (INDEPENDENT_AMBULATORY_CARE_PROVIDER_SITE_OTHER): Payer: BC Managed Care – PPO

## 2011-05-18 ENCOUNTER — Encounter: Payer: Self-pay | Admitting: Family Medicine

## 2011-05-18 DIAGNOSIS — K5090 Crohn's disease, unspecified, without complications: Secondary | ICD-10-CM

## 2011-05-18 DIAGNOSIS — Z0000 Encounter for general adult medical examination without abnormal findings: Secondary | ICD-10-CM

## 2011-05-18 LAB — COMPREHENSIVE METABOLIC PANEL
ALT: 20 U/L (ref 0–35)
AST: 34 U/L (ref 0–37)
CO2: 27 mEq/L (ref 19–32)
Chloride: 107 mEq/L (ref 96–112)
GFR: 77.37 mL/min (ref >60.00)
Sodium: 141 mEq/L (ref 135–145)
Total Bilirubin: 0.8 mg/dL (ref 0.3–1.2)
Total Protein: 7 g/dL (ref 6.0–8.3)

## 2011-05-18 LAB — LIPID PANEL
LDL Cholesterol: 86 mg/dL (ref 0–99)
Total CHOL/HDL Ratio: 3

## 2011-05-18 LAB — CBC WITH DIFFERENTIAL/PLATELET
Basophils Absolute: 0 10*3/uL (ref 0.0–0.1)
Basophils Relative: 1 % (ref 0.0–3.0)
Eosinophils Absolute: 0 10*3/uL (ref 0.0–0.7)
Hemoglobin: 11.8 g/dL — ABNORMAL LOW (ref 12.0–15.0)
Lymphocytes Relative: 27.6 % (ref 12.0–46.0)
Lymphs Abs: 1 10*3/uL (ref 0.7–4.0)
MCHC: 33.5 g/dL (ref 30.0–36.0)
MCV: 102.6 fl — ABNORMAL HIGH (ref 78.0–100.0)
Monocytes Absolute: 0.1 10*3/uL (ref 0.1–1.0)
Neutro Abs: 2.6 10*3/uL (ref 1.4–7.7)
RBC: 3.43 Mil/uL — ABNORMAL LOW (ref 3.87–5.11)
RDW: 14.4 % (ref 11.5–14.6)

## 2011-05-19 ENCOUNTER — Ambulatory Visit: Payer: BC Managed Care – PPO

## 2011-05-19 LAB — FOLATE: Folate: 12.4 ng/mL (ref >5.9)

## 2011-05-20 ENCOUNTER — Other Ambulatory Visit: Payer: BC Managed Care – PPO

## 2011-05-20 LAB — URINALYSIS, ROUTINE W REFLEX MICROSCOPIC
Glucose, UA: NEGATIVE
Protein, ur: 30 — AB
Specific Gravity, Urine: 1.029
Urobilinogen, UA: 1

## 2011-05-20 LAB — DIFFERENTIAL
Basophils Relative: 1
Lymphocytes Relative: 8 — ABNORMAL LOW
Monocytes Absolute: 0.3
Monocytes Relative: 3
Neutro Abs: 10.2 — ABNORMAL HIGH
Neutrophils Relative %: 88 — ABNORMAL HIGH

## 2011-05-20 LAB — COMPREHENSIVE METABOLIC PANEL
Albumin: 4.3
Alkaline Phosphatase: 88
BUN: 12
Calcium: 9.8
Creatinine, Ser: 0.9
Glucose, Bld: 127 — ABNORMAL HIGH
Potassium: 3.4 — ABNORMAL LOW
Total Protein: 8

## 2011-05-20 LAB — URINE MICROSCOPIC-ADD ON

## 2011-05-20 LAB — CBC
HCT: 45.5
Hemoglobin: 15.1 — ABNORMAL HIGH
MCHC: 33.2
MCV: 92.4
Platelets: 327
RDW: 16.1 — ABNORMAL HIGH

## 2011-05-22 ENCOUNTER — Encounter: Payer: Self-pay | Admitting: Family Medicine

## 2011-05-23 ENCOUNTER — Ambulatory Visit
Admission: RE | Admit: 2011-05-23 | Discharge: 2011-05-23 | Disposition: A | Discharge status: Home / Self Care | Payer: BC Managed Care – PPO | Source: Ambulatory Visit | Attending: Family Medicine | Admitting: Family Medicine

## 2011-05-23 DIAGNOSIS — Z1231 Encounter for screening mammogram for malignant neoplasm of breast: Secondary | ICD-10-CM

## 2011-05-24 ENCOUNTER — Ambulatory Visit
Admission: RE | Admit: 2011-05-24 | Discharge: 2011-05-24 | Disposition: A | Discharge status: Home / Self Care | Payer: BC Managed Care – PPO | Source: Ambulatory Visit | Attending: Gastroenterology | Admitting: Gastroenterology

## 2011-05-24 ENCOUNTER — Encounter: Payer: Self-pay | Admitting: *Deleted

## 2011-05-26 ENCOUNTER — Encounter: Payer: BC Managed Care – PPO | Admitting: Family Medicine

## 2011-05-26 DIAGNOSIS — Z0289 Encounter for other administrative examinations: Secondary | ICD-10-CM

## 2011-05-30 HISTORY — PX: COLONOSCOPY: SHX174

## 2011-05-30 HISTORY — PX: ESOPHAGOGASTRODUODENOSCOPY: SHX1529

## 2011-06-05 ENCOUNTER — Encounter: Payer: Self-pay | Admitting: Family Medicine

## 2011-06-05 ENCOUNTER — Emergency Department (HOSPITAL_COMMUNITY)
Admission: EM | Admit: 2011-06-05 | Discharge: 2011-06-05 | Disposition: A | Discharge status: Home / Self Care | Payer: BC Managed Care – PPO | Attending: Emergency Medicine | Admitting: Emergency Medicine

## 2011-06-05 ENCOUNTER — Telehealth: Payer: Self-pay | Admitting: Family Medicine

## 2011-06-05 ENCOUNTER — Emergency Department (HOSPITAL_COMMUNITY): Payer: BC Managed Care – PPO

## 2011-06-05 DIAGNOSIS — M545 Low back pain, unspecified: Secondary | ICD-10-CM | POA: Insufficient documentation

## 2011-06-05 DIAGNOSIS — K5900 Constipation, unspecified: Secondary | ICD-10-CM | POA: Insufficient documentation

## 2011-06-05 DIAGNOSIS — Z79899 Other long term (current) drug therapy: Secondary | ICD-10-CM | POA: Insufficient documentation

## 2011-06-05 DIAGNOSIS — K921 Melena: Secondary | ICD-10-CM | POA: Insufficient documentation

## 2011-06-05 DIAGNOSIS — R109 Unspecified abdominal pain: Secondary | ICD-10-CM | POA: Insufficient documentation

## 2011-06-05 DIAGNOSIS — K5090 Crohn's disease, unspecified, without complications: Secondary | ICD-10-CM | POA: Insufficient documentation

## 2011-06-05 DIAGNOSIS — Z7982 Long term (current) use of aspirin: Secondary | ICD-10-CM | POA: Insufficient documentation

## 2011-06-05 DIAGNOSIS — K644 Residual hemorrhoidal skin tags: Secondary | ICD-10-CM | POA: Insufficient documentation

## 2011-06-05 DIAGNOSIS — R112 Nausea with vomiting, unspecified: Secondary | ICD-10-CM | POA: Insufficient documentation

## 2011-06-05 LAB — URINALYSIS, ROUTINE W REFLEX MICROSCOPIC
Bilirubin Urine: NEGATIVE
Hgb urine dipstick: NEGATIVE
Ketones, ur: NEGATIVE mg/dL
Specific Gravity, Urine: 1.007 (ref 1.005–1.030)
pH: 5.5 (ref 5.0–8.0)

## 2011-06-05 LAB — DIFFERENTIAL
Lymphocytes Relative: 42 % (ref 12–46)
Lymphs Abs: 1 10*3/uL (ref 0.7–4.0)
Monocytes Absolute: 0.4 10*3/uL (ref 0.1–1.0)
Monocytes Relative: 17 % — ABNORMAL HIGH (ref 3–12)
Neutro Abs: 0.9 10*3/uL — ABNORMAL LOW (ref 1.7–7.7)

## 2011-06-05 LAB — LIPASE, BLOOD: Lipase: 40 U/L (ref 11–59)

## 2011-06-05 LAB — CBC
HCT: 33.4 % — ABNORMAL LOW (ref 36.0–46.0)
Hemoglobin: 11.9 g/dL — ABNORMAL LOW (ref 12.0–15.0)
MCH: 34 pg (ref 26.0–34.0)
MCV: 95.4 fL (ref 78.0–100.0)
Platelets: 240 10*3/uL (ref 150–400)
RBC: 3.5 MIL/uL — ABNORMAL LOW (ref 3.87–5.11)
WBC: 2.3 10*3/uL — ABNORMAL LOW (ref 4.0–10.5)

## 2011-06-05 LAB — COMPREHENSIVE METABOLIC PANEL
Albumin: 4 g/dL (ref 3.5–5.2)
Alkaline Phosphatase: 119 U/L — ABNORMAL HIGH (ref 39–117)
BUN: 10 mg/dL (ref 6–23)
CO2: 27 mEq/L (ref 19–32)
Chloride: 109 mEq/L (ref 96–112)
GFR calc non Af Amer: >90 mL/min (ref >90)
Glucose, Bld: 89 mg/dL (ref 70–99)
Potassium: 3.4 mEq/L — ABNORMAL LOW (ref 3.5–5.1)
Total Bilirubin: 0.4 mg/dL (ref 0.3–1.2)

## 2011-06-05 LAB — OCCULT BLOOD, POC DEVICE: Fecal Occult Bld: POSITIVE

## 2011-06-05 NOTE — Telephone Encounter (Signed)
Can we call to notify of recent blood work - pt missed CPE appt. Folate normal. Chol levels all good. Kidneys, liver, sugar, thyroid, ferritin normal. Slight anemia, slightly low white count, borderline B12. May recommend supplementation.

## 2011-06-06 NOTE — Telephone Encounter (Signed)
Patient notified and CPX rescheduled.

## 2011-06-06 NOTE — Telephone Encounter (Signed)
Message left for patient to return my call.  

## 2011-06-22 ENCOUNTER — Encounter: Payer: BC Managed Care – PPO | Admitting: Family Medicine

## 2011-06-29 ENCOUNTER — Other Ambulatory Visit: Payer: Self-pay | Admitting: Gastroenterology

## 2011-07-13 ENCOUNTER — Encounter: Payer: Self-pay | Admitting: Family Medicine

## 2011-07-14 ENCOUNTER — Other Ambulatory Visit: Payer: Self-pay | Admitting: Family Medicine

## 2011-07-25 ENCOUNTER — Encounter: Payer: BC Managed Care – PPO | Admitting: Family Medicine

## 2011-07-25 DIAGNOSIS — Z0289 Encounter for other administrative examinations: Secondary | ICD-10-CM

## 2011-07-26 ENCOUNTER — Encounter: Payer: Self-pay | Admitting: Family Medicine

## 2011-08-11 ENCOUNTER — Other Ambulatory Visit: Payer: Self-pay | Admitting: Gastroenterology

## 2011-08-11 ENCOUNTER — Ambulatory Visit
Admission: RE | Admit: 2011-08-11 | Discharge: 2011-08-11 | Disposition: A | Discharge status: Home / Self Care | Payer: BC Managed Care – PPO | Source: Ambulatory Visit | Attending: Gastroenterology | Admitting: Gastroenterology

## 2011-08-11 DIAGNOSIS — Z111 Encounter for screening for respiratory tuberculosis: Secondary | ICD-10-CM

## 2011-08-15 ENCOUNTER — Other Ambulatory Visit: Payer: Self-pay | Admitting: Family Medicine

## 2011-08-16 ENCOUNTER — Encounter: Payer: Self-pay | Admitting: Family Medicine

## 2011-08-16 ENCOUNTER — Ambulatory Visit (INDEPENDENT_AMBULATORY_CARE_PROVIDER_SITE_OTHER): Payer: BC Managed Care – PPO | Admitting: Family Medicine

## 2011-08-16 VITALS — BP 170/104 | HR 79 | Temp 98.2°F | Resp 16 | Ht 61.0 in | Wt 132.8 lb

## 2011-08-16 DIAGNOSIS — D72819 Decreased white blood cell count, unspecified: Secondary | ICD-10-CM | POA: Insufficient documentation

## 2011-08-16 DIAGNOSIS — F341 Dysthymic disorder: Secondary | ICD-10-CM

## 2011-08-16 DIAGNOSIS — E538 Deficiency of other specified B group vitamins: Secondary | ICD-10-CM

## 2011-08-16 DIAGNOSIS — F419 Anxiety disorder, unspecified: Secondary | ICD-10-CM

## 2011-08-16 DIAGNOSIS — I639 Cerebral infarction, unspecified: Secondary | ICD-10-CM

## 2011-08-16 DIAGNOSIS — Z0000 Encounter for general adult medical examination without abnormal findings: Secondary | ICD-10-CM

## 2011-08-16 DIAGNOSIS — F32A Depression, unspecified: Secondary | ICD-10-CM

## 2011-08-16 DIAGNOSIS — I10 Essential (primary) hypertension: Secondary | ICD-10-CM

## 2011-08-16 DIAGNOSIS — I6350 Cerebral infarction due to unspecified occlusion or stenosis of unspecified cerebral artery: Secondary | ICD-10-CM

## 2011-08-16 DIAGNOSIS — K5090 Crohn's disease, unspecified, without complications: Secondary | ICD-10-CM

## 2011-08-16 DIAGNOSIS — D72829 Elevated white blood cell count, unspecified: Secondary | ICD-10-CM | POA: Insufficient documentation

## 2011-08-16 MED ORDER — CYANOCOBALAMIN 1000 MCG/ML IJ SOLN
1000.0000 ug | Freq: Once | INTRAMUSCULAR | Status: AC
  Administered 2011-08-16: 1000 ug via INTRAMUSCULAR

## 2011-08-16 MED ORDER — MIRTAZAPINE 15 MG PO TABS: 15.0000 mg | ORAL_TABLET | Freq: Every day | ORAL | Status: DC

## 2011-08-16 MED ORDER — METOPROLOL SUCCINATE ER 25 MG PO TB24: 25.0000 mg | ORAL_TABLET | Freq: Every day | ORAL | Status: DC

## 2011-08-16 NOTE — Assessment & Plan Note (Signed)
Deteriorating. On max dose celexa. rec start remeron to hopefully help with sleep. rtc 1 mo for f/u. Continue lorazepam.

## 2011-08-16 NOTE — Patient Instructions (Addendum)
Check on when last tetanus shot was. Start metoprolol once a day for blood pressure. Check at home a few times a week and keep log for next visit. Return in 1 month for follow up blood pressure. b12 shot today - once a week for 1 month then monthly indefinitely, indication crohn's and B12 deficiency We will recheck white blood count in 2-3 months to ensure coming up. Check with GI about pneumonia shot.   We will discuss checking bone density for osteoporosis if you stay on entocort. Start remeron for mood.

## 2011-08-16 NOTE — Assessment & Plan Note (Signed)
Recently increased activity, followed by GI, considering biological agent, continued entocort and 6MP

## 2011-08-16 NOTE — Progress Notes (Signed)
Subjective:    Patient ID: Sharon [name], female    DOB: 06-04-1956, 55 y.o.   MRN: [id number]  HPI CC: CPE today  Has had issues with crohn's disease.  Continued inflammation s/p colonoscopy recently.  Started on entocort by GI.  Considering biological agent.  Not feeling well for last several months.  Nauseated (improved on entocort), shakey, headaches.  Some confusion intermittently (calling daughter wrong name).  Increased anxiety recently.  No motivation, no energy.  No SI/HI.  + anhedonia.  Doesn't feel celexa controlling depression/anxiety.  BP elevated - states elevated in past, but every time rechecked has been back to normal.  Endorsing headaches. BP Readings from Last 3 Encounters:  08/16/11 160/110  05/16/11 122/86   Preventative: Last CPE - unsure.  Does get well woman exams with OBGYN Dr. Toney Rakes, has had one in last year (breast and pap).  H/o cervical cancer s/p hysterectomy. Mammogram WNL 04/2010 Flu at work. Will check at school about tetanus. Never had PNA shot - will check with GI. Thinks has had dexa scan, told normal.  Will check with GI  Medications and allergies reviewed and updated in chart.  Past histories reviewed and updated if relevant as below. Patient Active Problem List  Diagnoses  . HTN (hypertension)  . Migraines  . Crohn's  . Anxiety and depression  . Smoker  . CVA (cerebral infarction)   Past Medical History  Diagnosis Date  . Elevated blood pressure   . Migraines     and frequent other headaches (sinus or stress)  . Crohn's disease     h/o stenotic crohn's, active started entocort, considering biological. PPD neg ([facility] Dr. [name])  . Cervical cancer 12/2008    s/p hysterectomy  . History of chicken pox   . Anxiety and depression   . History of anemia     attributed to crohn's  . Arthritis   . Genital warts   . History of syphilis 1980s  . Smoker   . CVA (cerebral infarction) 05/2010    TIA vs complex migraine  .  Pancytopenia     mild, found in hospital  . Hepatic steatosis 04/2011    diffuse on CT, 52m gallbladder polyp   Past Surgical History  Procedure Date  . Appendectomy 1998  . Right colectomy 2000    crohn's disease, ileocecal resection  . Vaginal hysterectomy 2010    complete, ovaries out  . Hospitalization 05/2010    TIA vs complex migraine - w/u negative - head CT, MRI/MRA, carotid dopplers, echo.  treated with ASA and tramadol  . [place] 05/[place]    nl LV fxn ,EF 60%  . Ct abd wo & pelvis w cm 02/2011    nothing acute, chronic terminal ileitis  . Abd u[place]9/[place]    diffuse hepatic steatosis, 564mpolyp in gallbaldder fundus, no stones, mod abd ath  . Colonoscopy 10/2008    ileo-colonic anastomosis, ielocolonic crohn's  . Colonoscopy 05/2011    ileo-colonic anastomosis normal, normal mucosa  . Esophagogastroduodenoscopy 05/2011    nl esophagus, gastritis, nl duodenum   History  Substance Use Topics  . Smoking status: Current Everyday Smoker -- 0.5 packs/day    Types: Cigarettes  . Smokeless tobacco: Never Used  . Alcohol Use: No     remote EtOh use   Family History  Problem Relation Age of Onset  . Crohn's disease Daughter     crohn's, ulcerative colitis  . Stroke Mother   . Hypertension  Mother   . Hyperlipidemia Mother   . Hypertension Father   . Crohn's disease Father   . Crohn's disease Sister   . Cancer Sister     ovarian  . Crohn's disease Paternal Aunt   . Crohn's disease Paternal Uncle   . Diabetes Neg Hx    Allergies  Allergen Reactions  . Codeine Nausea And Vomiting   Current Outpatient Prescriptions on File Prior to Visit  Medication Sig Dispense Refill  . aspirin EC 81 MG tablet Take 1 tablet (81 mg total) by mouth daily.      . citalopram (CELEXA) 40 MG tablet TAKE 1 TABLET BY MOUTH DAILY  30 tablet  3  . LORazepam (ATIVAN) 1 MG tablet Take 1 mg by mouth 2 (two) times daily as needed.        . mercaptopurine (PURINETHOL) 50 MG tablet Take  1.5 mg/kg by mouth daily. Give on an empty stomach 1 hour before or 2 hours after meals. Caution: Chemotherapy.       Marland Kitchen omeprazole (PRILOSEC) 40 MG capsule Take 40 mg by mouth 2 (two) times daily.        . valACYclovir (VALTREX) 500 MG tablet Take 500 mg by mouth daily.         No current facility-administered medications on file prior to visit.   Review of Systems  Constitutional: Positive for chills. Negative for fever, activity change, appetite change, fatigue and unexpected weight change.  HENT: Negative for hearing loss and neck pain.   Eyes: Negative for visual disturbance.  Respiratory: Positive for shortness of breath. Negative for cough, chest tightness and wheezing.   Cardiovascular: Negative for chest pain, palpitations and leg swelling.  Gastrointestinal: Positive for nausea, diarrhea, constipation and blood in stool. Negative for vomiting, abdominal pain and abdominal distention.  Genitourinary: Negative for hematuria and difficulty urinating.  Musculoskeletal: Negative for myalgias and arthralgias.  Skin: Negative for rash.  Neurological: Positive for headaches. Negative for dizziness, seizures and syncope.  Hematological: Does not bruise/bleed easily.  Psychiatric/Behavioral: Negative for dysphoric mood. The patient is not nervous/anxious.        Objective:   Physical Exam  Nursing note and vitals reviewed. Constitutional: She is oriented to person, place, and time. She appears well-developed and well-nourished. No distress.  HENT:  Head: Normocephalic and atraumatic.  Right Ear: External ear normal.  Left Ear: External ear normal.  Nose: Nose normal.  Mouth/Throat: Oropharynx is clear and moist. No oropharyngeal exudate.  Eyes: Conjunctivae and EOM are normal. Pupils are equal, round, and reactive to light. No scleral icterus.  Neck: Normal range of motion. Neck supple. No thyromegaly present.  Cardiovascular: Normal rate, regular rhythm, normal heart sounds and  intact distal pulses.   No murmur heard. Pulses:      Radial pulses are 2+ on the right side, and 2+ on the left side.  Pulmonary/Chest: Effort normal and breath sounds normal. No respiratory distress. She has no wheezes. She has no rales.  Abdominal: Soft. Bowel sounds are normal. She exhibits no distension and no mass. There is no tenderness. There is no rebound and no guarding.  Musculoskeletal: Normal range of motion. She exhibits no edema.  Lymphadenopathy:    She has no cervical adenopathy.  Neurological: She is alert and oriented to person, place, and time.       CN grossly intact, station and gait intact  Skin: Skin is warm and dry. No rash noted.  Psychiatric: She has a normal mood and  affect. Her behavior is normal. Judgment and thought content normal.      Assessment & Plan:

## 2011-08-16 NOTE — Assessment & Plan Note (Signed)
Compliant with baby aspirin daily.

## 2011-08-16 NOTE — Assessment & Plan Note (Signed)
Reviewed preventative protocols. Pt will check at work about tetanus. Receives well woman at [facility]. Will discuss rpt dexa if stays on entocort. utd mammo 04/2010 Birads 1.

## 2011-08-16 NOTE — Assessment & Plan Note (Signed)
Could account for some of her sxs.  Will need longterm replacement given h/o crohn's. Start weekly B12 shots x 1 month then monthly indefinitely. Recheck CBC after a few months of replacement.

## 2011-08-16 NOTE — Assessment & Plan Note (Signed)
Has had this for prolonged period of time, replace vit B12 and if not improving, consider referral to hematology.

## 2011-08-16 NOTE — Assessment & Plan Note (Addendum)
Several elevated readings in past, rpt reading today elevated. Start toprol XL generic. Discussed to monitor HR and if running low to stop and let me know if running <60 consistently. EKG- NSR at rate 64, normal axis, intervals, no ST/T changes.

## 2011-08-24 ENCOUNTER — Ambulatory Visit: Payer: BC Managed Care – PPO

## 2011-08-24 DIAGNOSIS — E538 Deficiency of other specified B group vitamins: Secondary | ICD-10-CM

## 2011-08-24 MED ORDER — CYANOCOBALAMIN 1000 MCG/ML IJ SOLN
1000.0000 ug | Freq: Once | INTRAMUSCULAR | Status: AC
  Administered 2011-08-24: 1000 ug via INTRAMUSCULAR

## 2011-08-31 ENCOUNTER — Ambulatory Visit: Payer: BC Managed Care – PPO

## 2011-09-01 ENCOUNTER — Ambulatory Visit (INDEPENDENT_AMBULATORY_CARE_PROVIDER_SITE_OTHER): Payer: BC Managed Care – PPO | Admitting: *Deleted

## 2011-09-01 DIAGNOSIS — E538 Deficiency of other specified B group vitamins: Secondary | ICD-10-CM

## 2011-09-01 MED ORDER — CYANOCOBALAMIN 1000 MCG/ML IJ SOLN
1000.0000 ug | Freq: Once | INTRAMUSCULAR | Status: AC
  Administered 2011-09-01: 1000 ug via INTRAMUSCULAR

## 2011-09-08 ENCOUNTER — Ambulatory Visit (INDEPENDENT_AMBULATORY_CARE_PROVIDER_SITE_OTHER): Payer: BC Managed Care – PPO

## 2011-09-08 DIAGNOSIS — E538 Deficiency of other specified B group vitamins: Secondary | ICD-10-CM

## 2011-09-08 MED ORDER — CYANOCOBALAMIN 1000 MCG/ML IJ SOLN
1000.0000 ug | Freq: Once | INTRAMUSCULAR | Status: AC
  Administered 2011-09-08: 1000 ug via INTRAMUSCULAR

## 2011-09-15 ENCOUNTER — Other Ambulatory Visit: Payer: Self-pay

## 2011-09-15 MED ORDER — LORAZEPAM 1 MG PO TABS: 1.0000 mg | ORAL_TABLET | Freq: Two times a day (BID) | ORAL | Status: DC | PRN

## 2011-09-15 NOTE — Telephone Encounter (Signed)
Pt is going out of town and has Lorazepam to last until Sat. Pt had been getting med from Dr Manuela Schwartz Apple until pt started coming to Dr Danise Mina. Pt said she needs Lorazepam 1 mg taking 1 tab twice a day as needed called to [facility]. Pt can be reached at [phone].

## 2011-09-15 NOTE — Telephone Encounter (Signed)
plz phone in and notify pt.

## 2011-09-15 NOTE — Telephone Encounter (Signed)
Rx called in as directed and message left notifying patient.

## 2011-10-11 LAB — DIFFERENTIAL
Basophil #: 0 10*3/uL (ref 0.0–0.1)
Basophil %: 0.4 %
Eosinophil #: 0.2 10*3/uL (ref 0.0–0.7)
Eosinophil %: 3.5 %
Lymphocyte #: 1.5 10*3/uL (ref 1.0–3.6)
Monocyte #: 0.3 10*3/uL (ref 0.0–0.7)
Monocyte %: 6.8 %
Neutrophil #: 2.8 10*3/uL (ref 1.4–6.5)

## 2011-10-11 LAB — BASIC METABOLIC PANEL
Anion Gap: 11 (ref 7–16)
BUN: 7 mg/dL (ref 7–18)
EGFR (African American): >60
EGFR (Non-African Amer.): >60
Glucose: 104 mg/dL — ABNORMAL HIGH (ref 65–99)
Osmolality: 283 (ref 275–301)
Potassium: 3.9 mmol/L (ref 3.5–5.1)
Sodium: 143 mmol/L (ref 136–145)

## 2011-10-11 LAB — CBC
HGB: 11.8 g/dL — ABNORMAL LOW (ref 12.0–16.0)
MCH: 35.8 pg — ABNORMAL HIGH (ref 26.0–34.0)
MCV: 105 fL — ABNORMAL HIGH (ref 80–100)
Platelet: 266 10*3/uL (ref 150–440)
RBC: 3.31 10*6/uL — ABNORMAL LOW (ref 3.80–5.20)
WBC: 4.8 10*3/uL (ref 3.6–11.0)

## 2011-10-11 LAB — RAPID INFLUENZA A&B ANTIGENS

## 2011-10-12 ENCOUNTER — Inpatient Hospital Stay: Payer: Self-pay | Admitting: Internal Medicine

## 2011-10-13 ENCOUNTER — Other Ambulatory Visit: Payer: Self-pay

## 2011-10-13 ENCOUNTER — Ambulatory Visit (INDEPENDENT_AMBULATORY_CARE_PROVIDER_SITE_OTHER): Payer: BC Managed Care – PPO | Admitting: Family Medicine

## 2011-10-13 ENCOUNTER — Encounter: Payer: Self-pay | Admitting: Family Medicine

## 2011-10-13 VITALS — BP 118/84 | HR 76 | Temp 98.4°F | Wt 140.8 lb

## 2011-10-13 DIAGNOSIS — G459 Transient cerebral ischemic attack, unspecified: Secondary | ICD-10-CM

## 2011-10-13 DIAGNOSIS — F32A Depression, unspecified: Secondary | ICD-10-CM

## 2011-10-13 DIAGNOSIS — F341 Dysthymic disorder: Secondary | ICD-10-CM

## 2011-10-13 DIAGNOSIS — F419 Anxiety disorder, unspecified: Secondary | ICD-10-CM

## 2011-10-13 DIAGNOSIS — R51 Headache: Secondary | ICD-10-CM

## 2011-10-13 DIAGNOSIS — F410 Panic disorder [episodic paroxysmal anxiety] without agoraphobia: Secondary | ICD-10-CM

## 2011-10-13 DIAGNOSIS — R519 Headache, unspecified: Secondary | ICD-10-CM | POA: Insufficient documentation

## 2011-10-13 DIAGNOSIS — E538 Deficiency of other specified B group vitamins: Secondary | ICD-10-CM

## 2011-10-13 MED ORDER — AMITRIPTYLINE HCL 25 MG PO TABS: 25.0000 mg | ORAL_TABLET | Freq: Every day | ORAL | Status: DC

## 2011-10-13 MED ORDER — CYANOCOBALAMIN 1000 MCG/ML IJ SOLN
1000.0000 ug | Freq: Once | INTRAMUSCULAR | Status: AC
  Administered 2011-10-13: 1000 ug via INTRAMUSCULAR

## 2011-10-13 MED ORDER — LORAZEPAM 1 MG PO TABS: 1.0000 mg | ORAL_TABLET | Freq: Three times a day (TID) | ORAL | Status: DC | PRN

## 2011-10-13 NOTE — Assessment & Plan Note (Signed)
b12 shot today - will need lifelong, h/o crohn's

## 2011-10-13 NOTE — Assessment & Plan Note (Addendum)
A total of 30 minutes were spent face-to-face with the patient during this encounter and over half of that time was spent on counseling and coordination of care Does sound consistent with anxiety attacks worsening headaches. Reviewed records from [facility] as well as Dr. Carlis Abbott neuro consult note - asked to scan note into chart. Discussed effect of stress on recent issues - discussed importance of healthy stress-coping strategies. Pt interested in referral for counseling - will begin here. Placed referral for counseling which will help with anxity. Did not change current meds - continue celexa 51m and lorazepam 152mprn (with increase to TID).

## 2011-10-13 NOTE — Assessment & Plan Note (Addendum)
Dx in hospital by neurology (Dr. Carlis Abbott) was stress reaction with muscle spasm and mild L cervical radiculitis Given experiencing more frequent headaches, h/o migraines, I wonder if complex migraine picture in effect here. Discussed ppx medication with pt, she is interested in starting this - will start amitriptyline 62m 1/2 to 1 pill nightly for prophylaxis of headaches. rtc 1 mo for f/u. Consider addition of muscle relaxant as abortive therapy.

## 2011-10-13 NOTE — Telephone Encounter (Signed)
Pt left v/m that her Lorazepam had not been called in to [facility] and a call back # of [phone].Marland Kitchen Maudie Mercury said she would call in now. I tried to call pt at [phone] to let her know med was being called in  And I got recording person at [phone] could not accept calls at this time.

## 2011-10-13 NOTE — Patient Instructions (Signed)
B12 shot today. Start amitriptyline 69m nightly to help prevent headaches. [facility] today. We need to work on healthy stress relieving mechanisms. Return in 1 month for follow up. Pass by Marion's office for referral to psychology/counseling.

## 2011-10-13 NOTE — Telephone Encounter (Signed)
[facility]

## 2011-10-13 NOTE — Progress Notes (Signed)
  Subjective:    Patient ID: Sharon [name], female    DOB: [birth year], 56 y.o.   MRN: [id number]  HPI CC: not feeling well  Had anxiety attack 2d ago, went to [facility] ER.  Has been having worsening headaches last few months.  Tylenol would ease them but then would return at work.  HA described as throbbing bilateral temples as well as frontal pain.  Tried tylenol and tramadol at home but tramadol makes her nauseated and feel loopy.  Trouble focusing at work, felt anxiety peaking at work.  Shaking and HA when awakening, mind racing and body tingling.  Felt this was anxiety but then HA worsening and pain going down left neck.  Some trouble saying words she wanted to say.  L sided body weakness as well as chest pain (pressure and tightness in chest).  Went to [facility] ER - have requested records.  Per pt, normal EKG.  ?small lesion R brain on CT, admitted and had MRI done.  Still feeling some weak on left side.  Saw neurologist Dr. Carlis Abbott at [facility].  Thought stress related and possible migraines.  Dilaudid and zofran helped but in 2 hours pain would return.  Lorazepam tid prn which helped.  Tried cutting back on 3rd lorazepam but this worsened anxiety.  H/o TIA vs complex migraines 05/2010 with admission to [facility] that presented similarly.  Felt better this morning, but still shaky.  Prior h/o migraines, about 6-7 yrs ago, was on ppx med but unsure what this was.  --> RECORDS RECEIVED AND REVIEWED: EKG - NSR 62, Hgb 11.8, MCV 105, plt 266, BMP WNL x glu 104, TnI <0.02 CXR - WNL Head CT w/o contrast - ?62m lesion R post parieto-occipital lobe, changes of sinusitis MRI  w/ w/o contrast - no focal abnormality eval by neuro Dr. CCarlis Abbott- consistent with stress reaction with muscle spasm and L cervical radiculitis.  Review of Systems Per HPI    Objective:   Physical Exam  Nursing note and vitals reviewed. Constitutional: She is oriented to person, place, and time. She appears well-developed and  well-nourished. No distress.  HENT:  Head: Normocephalic and atraumatic.  Mouth/Throat: Oropharynx is clear and moist. No oropharyngeal exudate.  Eyes: Conjunctivae and EOM are normal. Pupils are equal, round, and reactive to light. No scleral icterus.  Neck: Normal range of motion. Neck supple. Carotid bruit is not present.  Cardiovascular: Normal rate, regular rhythm, normal heart sounds and intact distal pulses.   No murmur heard. Pulmonary/Chest: Effort normal and breath sounds normal. No respiratory distress. She has no wheezes. She has no rales.  Lymphadenopathy:    She has no cervical adenopathy.  Neurological: She is alert and oriented to person, place, and time. She has normal strength. No cranial nerve deficit or sensory deficit. She exhibits normal muscle tone. Coordination and gait normal.       Mildly positive romberg. Normal finger to nose, heel to shin.  No dysdiadokokinesia  Skin: Skin is warm and dry. No rash noted.       Assessment & Plan:

## 2011-10-14 ENCOUNTER — Ambulatory Visit: Payer: BC Managed Care – PPO

## 2011-10-17 ENCOUNTER — Telehealth: Payer: Self-pay

## 2011-10-17 NOTE — Telephone Encounter (Signed)
Patient was seen on Thursday for a problem she was having.  She missed work  On Friday and needs a doctors excuse.  Is this okay to give?

## 2011-10-17 NOTE — Telephone Encounter (Signed)
Ok to do. Thanks.  

## 2011-10-18 ENCOUNTER — Encounter: Payer: Self-pay | Admitting: *Deleted

## 2011-10-18 NOTE — Telephone Encounter (Signed)
Letter printed and placed up front for patient to pick up. Patient notified.

## 2011-10-24 LAB — CBC: platelet count: 217

## 2011-10-24 LAB — COMPREHENSIVE METABOLIC PANEL
ALT: 119 U/L — AB (ref 7–35)
AST: 85 U/L
Creat: 0.82
Potassium: 5 mmol/L
Sodium: 142 mmol/L (ref 137–147)
Total Protein: 7.6 g/dL

## 2011-10-25 ENCOUNTER — Encounter: Payer: Self-pay | Admitting: Family Medicine

## 2011-10-26 ENCOUNTER — Ambulatory Visit (INDEPENDENT_AMBULATORY_CARE_PROVIDER_SITE_OTHER): Payer: BC Managed Care – PPO | Admitting: Psychology

## 2011-10-26 DIAGNOSIS — F331 Major depressive disorder, recurrent, moderate: Secondary | ICD-10-CM

## 2011-11-01 ENCOUNTER — Encounter: Payer: Self-pay | Admitting: Family Medicine

## 2011-11-09 ENCOUNTER — Ambulatory Visit (INDEPENDENT_AMBULATORY_CARE_PROVIDER_SITE_OTHER): Payer: BC Managed Care – PPO | Admitting: Psychology

## 2011-11-09 DIAGNOSIS — F331 Major depressive disorder, recurrent, moderate: Secondary | ICD-10-CM

## 2011-11-10 ENCOUNTER — Encounter: Payer: Self-pay | Admitting: Family Medicine

## 2011-11-10 ENCOUNTER — Ambulatory Visit (INDEPENDENT_AMBULATORY_CARE_PROVIDER_SITE_OTHER): Payer: BC Managed Care – PPO | Admitting: Family Medicine

## 2011-11-10 ENCOUNTER — Telehealth: Payer: Self-pay | Admitting: *Deleted

## 2011-11-10 VITALS — BP 126/84 | HR 72 | Temp 98.0°F | Wt 143.8 lb

## 2011-11-10 DIAGNOSIS — E538 Deficiency of other specified B group vitamins: Secondary | ICD-10-CM

## 2011-11-10 DIAGNOSIS — F341 Dysthymic disorder: Secondary | ICD-10-CM

## 2011-11-10 DIAGNOSIS — F32A Depression, unspecified: Secondary | ICD-10-CM

## 2011-11-10 DIAGNOSIS — F17200 Nicotine dependence, unspecified, uncomplicated: Secondary | ICD-10-CM

## 2011-11-10 DIAGNOSIS — Z23 Encounter for immunization: Secondary | ICD-10-CM

## 2011-11-10 DIAGNOSIS — F419 Anxiety disorder, unspecified: Secondary | ICD-10-CM

## 2011-11-10 DIAGNOSIS — R51 Headache: Secondary | ICD-10-CM

## 2011-11-10 MED ORDER — CYANOCOBALAMIN 1000 MCG/ML IJ SOLN
1000.0000 ug | Freq: Once | INTRAMUSCULAR | Status: AC
  Administered 2011-11-10: 1000 ug via INTRAMUSCULAR

## 2011-11-10 NOTE — Progress Notes (Signed)
Addended by: Royann Shivers A on: 11/10/2011 09:58 AM   Modules accepted: Orders

## 2011-11-10 NOTE — Assessment & Plan Note (Signed)
encourage cessation

## 2011-11-10 NOTE — Assessment & Plan Note (Signed)
Stress rxn with muscle spasm/mild L cervical radiculitis. Continue amitriptylin 12.63m nightly. update [place] HA worsening

## 2011-11-10 NOTE — Assessment & Plan Note (Signed)
On cimzia biological. Tdap and pneumonia shot today. Has f/u with GI next week. ?nonhealing ulcer left nare - continue neosporin.  rec quit smoking.

## 2011-11-10 NOTE — Assessment & Plan Note (Signed)
b12 shot today.

## 2011-11-10 NOTE — Patient Instructions (Addendum)
No changes today.  Good to see you, call us with questions. Return as needed or in 6 months. Tdap and pneumonia today. Work on quitting smoking!

## 2011-11-10 NOTE — Telephone Encounter (Signed)
Spoke with patient and advised to see how she felt tomorrow and if not able to go to work, then will provide note. She verbalized understanding. She did request the phenergan to have on hand just in case she needs it. Called into [facility].

## 2011-11-10 NOTE — Progress Notes (Signed)
  Subjective:    Patient ID: Sharon [name], female    DOB: Sep 19, 1955, 56 y.o.   MRN: [id number]  HPI CC: 1 mo f/u HA and anxiety/stress  Seen here last month as hosp f/u for ?complex migraine vs stress reaction with muscle spasm and mild L cervical radiculitis with normal MRI during hospitalization.  Last visit did not change meds but did refer for CBT as well as started ppx amitriptyline for more frequent headaches.  Has seen Dr. Rexene Edison twice.  Feels doing well with anxiety.  Takes celexa 68m daily as well as lorazepam pretty regularly tid.  On cimzia for crohn's, controlling crohn's but notes spots on skin, face swelling with injections, elevated liver function.  Has appt with GI next week.  Amitriptyline 278mmade too groggy next am so changed to 1/2 pill nightly however has only been taking prn.  When gets headaches, takes tylenol which does help as well.  Has tramadol but that makes her feel off - discussed interaction with tramadol and SSRI.  Smoking - 1/2 pack/day.  Review of Systems Per HPI    Objective:   Physical Exam  Nursing note and vitals reviewed. Constitutional: She appears well-developed and well-nourished. No distress.  HENT:  Mouth/Throat: Oropharynx is clear and moist. No oropharyngeal exudate.       Left lateral ala with nonealing sore, nose  Eyes: Conjunctivae and EOM are normal. Pupils are equal, round, and reactive to light.  Neck: Normal range of motion. Neck supple.  Cardiovascular: Normal rate, regular rhythm, normal heart sounds and intact distal pulses.   No murmur heard. Pulmonary/Chest: Effort normal and breath sounds normal. No respiratory distress. She has no wheezes. She has no rales.  Musculoskeletal: She exhibits no edema.  Skin: Skin is warm and dry.  Psychiatric: She has a normal mood and affect.       Assessment & Plan:

## 2011-11-10 NOTE — Telephone Encounter (Signed)
Ok to do but I'd like to wait and see if she's feeling better tomorrow first.  If feels better, can go to work.  If not, may write out of work. Does she need medicine for nausea?  If so can send in phenergan 74m 1 PO Q6 PRN nausea, #30, RF0

## 2011-11-10 NOTE — Telephone Encounter (Signed)
Patient called and said she has been feeling nauseated off and on this week and now especially since the vaccines this morning. She said she feels like she needs a day off work and is asking for a note to cover her tomorrow. Is this ok?

## 2011-11-10 NOTE — Assessment & Plan Note (Signed)
Stable. No changes today. Continue to f/u with Dr. Rexene Edison. Discussed trying to back off lorazepam. rtc 6 mo

## 2011-11-11 ENCOUNTER — Telehealth: Payer: Self-pay | Admitting: *Deleted

## 2011-11-11 ENCOUNTER — Other Ambulatory Visit: Payer: Self-pay | Admitting: *Deleted

## 2011-11-11 MED ORDER — LORAZEPAM 1 MG PO TABS: 1.0000 mg | ORAL_TABLET | Freq: Three times a day (TID) | ORAL | Status: DC | PRN

## 2011-11-11 NOTE — Telephone Encounter (Signed)
Rx called in as directed.   

## 2011-11-11 NOTE — Telephone Encounter (Signed)
Patient called to request a doctors note for today because she didn't go to work.  Please advise.

## 2011-11-11 NOTE — Telephone Encounter (Signed)
Spoke with Dr. Darnell Level about this yesterday. Work note provided and message left notifying patient. Note placed up front for pick up.

## 2011-11-11 NOTE — Telephone Encounter (Signed)
plz phone in. 

## 2011-11-14 ENCOUNTER — Encounter (HOSPITAL_COMMUNITY): Payer: Self-pay | Admitting: Emergency Medicine

## 2011-11-14 ENCOUNTER — Emergency Department (HOSPITAL_COMMUNITY)
Admission: EM | Admit: 2011-11-14 | Discharge: 2011-11-15 | Disposition: A | Discharge status: Home / Self Care | Payer: BC Managed Care – PPO | Attending: Emergency Medicine | Admitting: Emergency Medicine

## 2011-11-14 DIAGNOSIS — M129 Arthropathy, unspecified: Secondary | ICD-10-CM | POA: Insufficient documentation

## 2011-11-14 DIAGNOSIS — Z8541 Personal history of malignant neoplasm of cervix uteri: Secondary | ICD-10-CM | POA: Insufficient documentation

## 2011-11-14 DIAGNOSIS — R109 Unspecified abdominal pain: Secondary | ICD-10-CM | POA: Insufficient documentation

## 2011-11-14 DIAGNOSIS — F341 Dysthymic disorder: Secondary | ICD-10-CM | POA: Insufficient documentation

## 2011-11-14 DIAGNOSIS — Z7982 Long term (current) use of aspirin: Secondary | ICD-10-CM | POA: Insufficient documentation

## 2011-11-14 DIAGNOSIS — Z79899 Other long term (current) drug therapy: Secondary | ICD-10-CM | POA: Insufficient documentation

## 2011-11-14 DIAGNOSIS — Z8673 Personal history of transient ischemic attack (TIA), and cerebral infarction without residual deficits: Secondary | ICD-10-CM | POA: Insufficient documentation

## 2011-11-14 DIAGNOSIS — K5090 Crohn's disease, unspecified, without complications: Secondary | ICD-10-CM | POA: Insufficient documentation

## 2011-11-14 DIAGNOSIS — R112 Nausea with vomiting, unspecified: Secondary | ICD-10-CM | POA: Insufficient documentation

## 2011-11-14 LAB — CBC
Hemoglobin: 15.3 g/dL — ABNORMAL HIGH (ref 12.0–15.0)
MCH: 34.7 pg — ABNORMAL HIGH (ref 26.0–34.0)
MCV: 94.1 fL (ref 78.0–100.0)
RBC: 4.41 MIL/uL (ref 3.87–5.11)

## 2011-11-14 MED ORDER — HYDROMORPHONE HCL PF 1 MG/ML IJ SOLN
1.0000 mg | Freq: Once | INTRAMUSCULAR | Status: AC
  Administered 2011-11-14: 1 mg via INTRAVENOUS
  Filled 2011-11-14: qty 1

## 2011-11-14 MED ORDER — SODIUM CHLORIDE 0.9 % IV BOLUS (SEPSIS)
1000.0000 mL | Freq: Once | INTRAVENOUS | Status: AC
  Administered 2011-11-14: 1000 mL via INTRAVENOUS

## 2011-11-14 MED ORDER — ONDANSETRON HCL 4 MG/2ML IJ SOLN
4.0000 mg | Freq: Once | INTRAMUSCULAR | Status: AC
  Administered 2011-11-14: 4 mg via INTRAVENOUS
  Filled 2011-11-14: qty 2

## 2011-11-14 NOTE — ED Notes (Addendum)
Pt to ED for eval abd pain described as stabbing a/w n/v started this evening about 6pm; pain is located in RLQ and LUQ; pt also reports SOB, dizziness, headache, and difficult time having BM; pt has hx of crohns and symptoms are consistent with her severe flare ups which she reports has been a few years; pt reports that he PCP checked liver enzymes and they were elevated

## 2011-11-15 ENCOUNTER — Emergency Department (HOSPITAL_COMMUNITY): Payer: BC Managed Care – PPO

## 2011-11-15 LAB — COMPREHENSIVE METABOLIC PANEL
CO2: 22 mEq/L (ref 19–32)
Calcium: 9.9 mg/dL (ref 8.4–10.5)
Creatinine, Ser: 0.69 mg/dL (ref 0.50–1.10)
GFR calc Af Amer: >90 mL/min (ref >90)
GFR calc non Af Amer: >90 mL/min (ref >90)
Glucose, Bld: 120 mg/dL — ABNORMAL HIGH (ref 70–99)

## 2011-11-15 LAB — URINALYSIS, ROUTINE W REFLEX MICROSCOPIC
Protein, ur: NEGATIVE mg/dL
Urobilinogen, UA: 1 mg/dL (ref 0.0–1.0)

## 2011-11-15 LAB — URINE MICROSCOPIC-ADD ON

## 2011-11-15 MED ORDER — PROMETHAZINE HCL 25 MG PO TABS: 25.0000 mg | ORAL_TABLET | Freq: Four times a day (QID) | ORAL | Status: DC | PRN

## 2011-11-15 MED ORDER — OXYCODONE-ACETAMINOPHEN 5-325 MG PO TABS: 2.0000 | ORAL_TABLET | ORAL | Status: AC | PRN

## 2011-11-15 MED ORDER — ONDANSETRON HCL 4 MG/2ML IJ SOLN
4.0000 mg | Freq: Once | INTRAMUSCULAR | Status: AC
  Administered 2011-11-15: 4 mg via INTRAVENOUS
  Filled 2011-11-15: qty 2

## 2011-11-15 MED ORDER — IOHEXOL 300 MG/ML  SOLN
80.0000 mL | Freq: Once | INTRAMUSCULAR | Status: AC | PRN
  Administered 2011-11-15: 80 mL via INTRAVENOUS

## 2011-11-15 NOTE — ED Notes (Signed)
Pt attempt to call daughter for a ride, no responses, pt stated " I  Might have to call cab for a ride".

## 2011-11-15 NOTE — ED Notes (Signed)
Drinking second cup of contrast now, tolerating it well.

## 2011-11-15 NOTE — ED Notes (Signed)
Patient returned from CT

## 2011-11-15 NOTE — Discharge Instructions (Signed)

## 2011-11-15 NOTE — ED Notes (Signed)
Patient resting with NAD at this time.

## 2011-11-15 NOTE — ED Provider Notes (Signed)
History     CSN: [id number]  Arrival date & time 11/14/11  2201   First MD Initiated Contact with Patient 11/14/11 2301      Chief Complaint  Patient presents with  . Abdominal Pain    (Consider location/radiation/quality/duration/timing/severity/associated sxs/prior treatment) HPI Abdominal pain located left upper quadrant and right lower quadrant. No radiation of pain. Quality is sharp in nature. Onset today and feels like a typical Crohn's flare with history of same. Has had multiple abdominal surgeries. Denies any diarrhea or bloody bowel movements. Has had associated nausea and vomiting without bloody emesis or bilious emesis. No fevers or chills. Moderate in severity. No known aggravating or alleviating factors. Past Medical History  Diagnosis Date  . Elevated blood pressure   . Migraines     and frequent other headaches (sinus or stress)  . Crohn's disease     h/o stenotic crohn's, active in TI, started [place], PPD neg ([facility] Dr. [name])  . Cervical cancer 12/2008    s/p hysterectomy  . History of chicken pox   . Anxiety and depression   . History of anemia     attributed to crohn's  . Arthritis   . Genital warts   . History of syphilis 1980s  . Smoker   . TIA (transient ischemic attack) 05/2010    TIA vs complex migraine (w/u negative - carotids, echo, TC doppler, and MRI normal)  . Pancytopenia     mild, found in hospital  . Hepatic steatosis 04/2011    diffuse on CT, 81m gallbladder polyp  . Vitamin B12 deficiency     Past Surgical History  Procedure Date  . Appendectomy 1998  . Right colectomy 2000    crohn's disease, ileocecal resection  . Vaginal hysterectomy 2010    complete, ovaries out  . Hospitalization 05/2010    TIA vs complex migraine - w/u negative - head CT, MRI/MRA, carotid dopplers, echo.  treated with ASA and tramadol  . [place] 05/2010    nl LV fxn ,EF 60%  . Ct abd wo & pelvis w cm 02/2011    nothing acute, chronic  terminal ileitis  . [place]    diffuse hepatic steatosis, 53mpolyp in gallbaldder fundus, no stones, mod abd ath  . Colonoscopy 10/2008    ileo-colonic anastomosis, ielocolonic crohn's  . Colonoscopy 05/2011    ileo-colonic anastomosis normal, normal mucosa  . Esophagogastroduodenoscopy 05/2011    nl esophagus, gastritis, nl duodenum    Family History  Problem Relation Age of Onset  . Crohn's disease Daughter     crohn's, ulcerative colitis  . Stroke Mother   . Hypertension Mother   . Hyperlipidemia Mother   . Hypertension Father   . Crohn's disease Father   . Crohn's disease Sister   . Cancer Sister     ovarian  . Crohn's disease Paternal Aunt   . Crohn's disease Paternal Uncle   . Diabetes Neg Hx     History  Substance Use Topics  . Smoking status: Current Everyday Smoker -- 0.5 packs/day    Types: Cigarettes  . Smokeless tobacco: Never Used  . Alcohol Use: No     remote EtOh use    OB History    Grav Para Term Preterm Abortions TAB SAB Ect Mult Living                  Review of Systems  Constitutional: Negative for fever and chills.  HENT: Negative for neck  pain and neck stiffness.   Eyes: Negative for pain.  Respiratory: Negative for shortness of breath.   Cardiovascular: Negative for chest pain.  Gastrointestinal: Positive for nausea, vomiting and abdominal pain.  Genitourinary: Negative for dysuria.  Musculoskeletal: Negative for back pain.  Skin: Negative for rash.  Neurological: Negative for headaches.  All other systems reviewed and are negative.    Allergies  Codeine and Remeron  Home Medications   Current Outpatient Rx  Name Route Sig Dispense Refill  . ACETAMINOPHEN 500 MG PO TABS Oral Take 1,000 mg by mouth every 6 (six) hours as needed. For pain    . AMITRIPTYLINE HCL 25 MG PO TABS Oral Take 12.5 mg by mouth at bedtime. For headache prevention    . ASPIRIN EC 81 MG PO TBEC Oral Take 1 tablet (81 mg total) by mouth daily.    Marland Kitchen  CIMZIA [place] Subcutaneous Inject into the skin every 30 (thirty) days.    Marland Kitchen CITALOPRAM HYDROBROMIDE 40 MG PO TABS Oral Take 40 mg by mouth daily.    . CYANOCOBALAMIN 1000 MCG/ML IJ SOLN Intramuscular Inject 1,000 mcg into the muscle every 30 (thirty) days.      Marland Kitchen LORAZEPAM 1 MG PO TABS Oral Take 1 tablet (1 mg total) by mouth 3 (three) times daily as needed for anxiety. 90 tablet 0  . METOPROLOL SUCCINATE ER 25 MG PO TB24 Oral Take 1 tablet (25 mg total) by mouth daily. 30 tablet 6  . OMEPRAZOLE 40 MG PO CPDR Oral Take 40 mg by mouth 2 (two) times daily.      Marland Kitchen PROMETHAZINE HCL 25 MG PO TABS Oral Take 25 mg by mouth every 6 (six) hours as needed. For upset stomach    . VALACYCLOVIR HCL 500 MG PO TABS Oral Take 500 mg by mouth daily.        BP 111/74  Pulse 83  Temp(Src) 97.9 F (36.6 C) (Oral)  Resp 22  SpO2 100%  Physical Exam  Constitutional: She is oriented to person, place, and time. She appears well-developed and well-nourished.  HENT:  Head: Normocephalic and atraumatic.  Eyes: Conjunctivae and EOM are normal. Pupils are equal, round, and reactive to light.  Neck: Trachea normal. Neck supple. No thyromegaly present.  Cardiovascular: Normal rate, regular rhythm, S1 normal, S2 normal and normal pulses.     No systolic murmur is present   No diastolic murmur is present  Pulses:      Radial pulses are 2+ on the right side, and 2+ on the left side.  Pulmonary/Chest: Effort normal and breath sounds normal. She has no wheezes. She has no rhonchi. She has no rales. She exhibits no tenderness.  Abdominal: Soft. Normal appearance and bowel sounds are normal. She exhibits no distension. There is no rebound, no guarding, no CVA tenderness and negative Murphy's sign.       Diffuse tenderness more so right lower quadrant without peritonitis  Musculoskeletal:       BLE:s Calves nontender, no cords or erythema, negative Homans sign  Neurological: She is alert and oriented to person, place, and  time. She has normal strength. No cranial nerve deficit or sensory deficit. GCS eye subscore is 4. GCS verbal subscore is 5. GCS motor subscore is 6.  Skin: Skin is warm and dry. No rash noted. She is not diaphoretic.  Psychiatric: Her speech is normal.       Cooperative and appropriate    ED Course  Procedures (including critical care time)  Labs Reviewed  CBC - Abnormal; Notable for the following:    WBC 12.5 (*)    Hemoglobin 15.3 (*)    MCH 34.7 (*)    MCHC 36.9 (*)    All other components within normal limits  COMPREHENSIVE METABOLIC PANEL - Abnormal; Notable for the following:    Glucose, Bld 120 (*)    Total Protein 8.4 (*)    AST 57 (*) HEMOLYSIS AT THIS LEVEL MAY AFFECT RESULT   All other components within normal limits  URINALYSIS, ROUTINE W REFLEX MICROSCOPIC - Abnormal; Notable for the following:    Color, Urine AMBER (*) BIOCHEMICALS MAY BE AFFECTED BY COLOR   Hgb urine dipstick TRACE (*)    Bilirubin Urine SMALL (*)    Ketones, ur 15 (*)    Leukocytes, UA SMALL (*)    All other components within normal limits  LIPASE, BLOOD  URINE MICROSCOPIC-ADD ON   Ct Abdomen Pelvis W Contrast  11/15/2011  *RADIOLOGY REPORT*  Clinical Data: Stabbing abdominal pain; right lower quadrant and left upper quadrant pain.  Nausea and vomiting.  Shortness of breath, dizziness and headache.  History of Crohn's disease.  CT ABDOMEN AND PELVIS WITH CONTRAST  Technique:  Multidetector CT imaging of the abdomen and pelvis was performed following the standard protocol during bolus administration of intravenous contrast.  Contrast: 57m OMNIPAQUE IOHEXOL 300 MG/ML IJ SOLN  Comparison: CT of the abdomen and pelvis performed 03/09/2011, and abdominal ultrasound performed 05/24/2011  Findings: Minimal bibasilar atelectasis is noted.  The liver and spleen are unremarkable in appearance.  The gallbladder is within normal limits.  The pancreas and adrenal glands are unremarkable.  The kidneys are  unremarkable in appearance.  There is no evidence of hydronephrosis.  No renal or ureteral stones are seen.  No perinephric stranding is appreciated.  There is mild diffuse inflammation about the mid to distal small bowel, which is filled with fluid.  This is particularly prominent along the remaining distal ileum, with associated wall thickening along the distal ileum just proximal to the ileocolic anastomosis. Trace associated free fluid is noted tracking about small bowel loops.  The duodenum and the jejunum are grossly unremarkable in appearance.  Findings are compatible with exacerbation of the patient's Crohn's disease.  The stomach is filled with contrast and is within normal limits. No acute vascular abnormalities are seen.  Scattered calcification is noted along the abdominal aorta and its branches.  The patient is status post resection of the cecum and appendix. The remaining colon is unremarkable in appearance.  The bladder is decompressed and not well assessed.  The patient is status post hysterectomy.  No suspicious adnexal masses are seen. Trace free fluid is noted within the pelvis.  No inguinal lymphadenopathy is seen.  No acute osseous abnormalities are identified.  IMPRESSION:  1.  Mild diffuse inflammation about the mid to distal small bowel, which is filled with fluid; this is particularly prominent along the remaining distal ileum, with mild associated ileal wall thickening.  Trace free fluid tracking about small bowel loops. Findings compatible with exacerbation of the patient's Crohn's disease. 2.  Scattered calcification along the abdominal aorta and its branches. 3.  Ileocolic anastomosis grossly unremarkable in appearance. 4.  Trace free fluid noted within the pelvis.  Original Report Authenticated By: JSanta Lighter M.D.    IV fluids. Dilaudid IV. Zofran IV. Labs obtained and reviewed as above. CT scan obtained and reviewed as above.  Recheck at 3:05 AM feels much improved  and is  requesting to be discharged home. MDM   Abdominal pain with Crohn's flare and workup as above. Patient is followed by GI Dr. [name] and plan discharge home with pain medications and close GI followup for further recommendations and evaluation. Patient stable for discharge home at this time. She is a reliable historian and verbalizes understanding abdominal pain precautions.        Teressa Lower, MD 11/15/11 [phone]

## 2011-11-23 ENCOUNTER — Ambulatory Visit: Payer: BC Managed Care – PPO | Admitting: Psychology

## 2011-12-09 ENCOUNTER — Ambulatory Visit (INDEPENDENT_AMBULATORY_CARE_PROVIDER_SITE_OTHER): Payer: BC Managed Care – PPO | Admitting: *Deleted

## 2011-12-09 DIAGNOSIS — E538 Deficiency of other specified B group vitamins: Secondary | ICD-10-CM

## 2011-12-09 MED ORDER — CYANOCOBALAMIN 1000 MCG/ML IJ SOLN
1000.0000 ug | Freq: Once | INTRAMUSCULAR | Status: AC
  Administered 2011-12-09: 1000 ug via INTRAMUSCULAR

## 2011-12-12 ENCOUNTER — Other Ambulatory Visit: Payer: Self-pay | Admitting: Family Medicine

## 2011-12-15 ENCOUNTER — Encounter: Payer: Self-pay | Admitting: Gynecology

## 2011-12-15 ENCOUNTER — Ambulatory Visit (INDEPENDENT_AMBULATORY_CARE_PROVIDER_SITE_OTHER): Payer: BC Managed Care – PPO | Admitting: Gynecology

## 2011-12-15 ENCOUNTER — Other Ambulatory Visit (HOSPITAL_COMMUNITY)
Admission: RE | Admit: 2011-12-15 | Discharge: 2011-12-15 | Disposition: A | Discharge status: Home / Self Care | Payer: BC Managed Care – PPO | Source: Ambulatory Visit | Attending: Gynecology | Admitting: Gynecology

## 2011-12-15 VITALS — BP 130/78 | Ht 61.75 in | Wt 147.0 lb

## 2011-12-15 DIAGNOSIS — N951 Menopausal and female climacteric states: Secondary | ICD-10-CM

## 2011-12-15 DIAGNOSIS — Z01419 Encounter for gynecological examination (general) (routine) without abnormal findings: Secondary | ICD-10-CM

## 2011-12-15 DIAGNOSIS — N898 Other specified noninflammatory disorders of vagina: Secondary | ICD-10-CM

## 2011-12-15 LAB — WET PREP FOR TRICH, YEAST, CLUE: Clue Cells Wet Prep HPF POC: NONE SEEN

## 2011-12-15 MED ORDER — CLINDAMYCIN PHOSPHATE 2 % VA CREA: 1.0000 | TOPICAL_CREAM | Freq: Every day | VAGINAL | Status: AC

## 2011-12-15 NOTE — Progress Notes (Signed)
Ashia Dehner Mcnease [birth year] [id number]   History:    56 y.o.  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 She has a history of LAVH BSO in 2010 with no evidence of invasive disease on final pathology report. Patient had been on ethinyl estradiol 1.25 mg daily in the past and is no longer on hormonal replacement therapy and is having minimal vasomotor symptoms. She does have history of chronic smoking where she smokes one pack cigarette per day and stated that she has used different treatment options and support groups in the past but that family members that live with her daily smoke as well making it difficult for her to quit. 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 She also has a past history of syphilis. Patient is long overdue for her bone density study as well. Previous studies done at another facility. Patient does not recall the result. Patient has history of Crohn's disease and her last colonoscopy was in 2012 by Dr. [name]. She was complaining of slight vaginal discharge today. Patient not sexually active.  Past medical history,surgical history, family history and social history were all reviewed and documented in the EPIC chart.  Gynecologic History No LMP recorded. Patient has had a hysterectomy. Contraception: none Last Pap: 2011. Results were: normal Last mammogram: September 2012. Results were: normal  Obstetric History OB History    Grav Para Term Preterm Abortions TAB SAB Ect Mult Living   4 3   1  1   3      # Outc Date GA Lbr Len/2nd Wgt Sex Del Anes PTL Lv   1 PAR            2 PAR            3 PAR            4 SAB                ROS:  Was performed and pertinent positives and negatives are included in the history.  Exam: chaperone present  BP 130/78  Ht 5' 1.75" (1.568 m)  Wt 147 lb (66.679 kg)  BMI 27.10 kg/m2  Body mass index is 27.10 kg/(m^2).  General appearance : Well developed well nourished female. No  acute distress HEENT: Neck supple, trachea midline, no carotid bruits, no thyroidmegaly Lungs: Clear to auscultation, no rhonchi or wheezes, or rib retractions  Heart: Regular rate and rhythm, no murmurs or gallops Breast:Examined in sitting and supine position were symmetrical in appearance, no palpable masses or tenderness,  no skin retraction, no nipple inversion, no nipple discharge, no skin discoloration, no axillary or supraclavicular lymphadenopathy Abdomen: no palpable masses or tenderness, no rebound or guarding Extremities: no edema or skin discoloration or tenderness  Pelvic:  Bartholin, Urethra, Skene Glands: Within normal limits             Vagina: No gross lesions or discharge  Cervix: Absent  Uterus absent Adnexa  Without masses or tenderness  Anus and perineum  normal   Rectovaginal  normal sphincter tone without palpated masses or tenderness             Hemoccult cards presented to the patient to submit to the office for testing.   Wet prep: Few WBCs many bacteria  Assessment/Plan:  56 y.o. female for annual exam with past history of CIN-3 no invasion on pathology report from hysterectomy specimen. Patient has had normal Pap smear  since then. Patient no longer hormone replacement therapy. She will continue to have Pap smears on a yearly basis because of her past history of high-grade dysplasia. She was instructed to do her monthly self breast examination and to schedule her mammogram this September. A requisition was provided for her to schedule her bone density study here in the office in the next few weeks. For her BV she will be given a prescription Cleocin vaginal cream to apply each bedtime for 5-7 nights. Her primary physician has done her lab work within a year so no blood work be drawn today. We discussed importance of calcium and vitamin D for osteoporosis prevention as well as regular exercise program. Literature information on anti-smoking techniques and support groups  was provided as well.    Terrance Mass MD, 3:41 PM 12/15/2011

## 2011-12-15 NOTE — Patient Instructions (Addendum)
Smoking Cessation This document explains the best ways for you to quit smoking and new treatments to help. It lists new medicines that can double or triple your chances of quitting and quitting for good. It also considers ways to avoid relapses and concerns you may have about quitting, including weight gain. NICOTINE: A POWERFUL ADDICTION If you have tried to quit smoking, you know how hard it can be. It is hard because nicotine is a very addictive drug. For some people, it can be as addictive as heroin or cocaine. Usually, people make 2 or 3 tries, or more, before finally being able to quit. Each time you try to quit, you can learn about what helps and what hurts. Quitting takes hard work and a lot of effort, but you can quit smoking. QUITTING SMOKING IS ONE OF THE MOST IMPORTANT THINGS YOU WILL EVER DO.  You will live longer, feel better, and live better.   The impact on your body of quitting smoking is felt almost immediately:   Within 20 minutes, blood pressure decreases. Pulse returns to its normal level.   After 8 hours, carbon monoxide levels in the blood return to normal. Oxygen level increases.   After 24 hours, chance of heart attack starts to decrease. Breath, hair, and body stop smelling like smoke.   After 48 hours, damaged nerve endings begin to recover. Sense of taste and smell improve.   After 72 hours, the body is virtually free of nicotine. Bronchial tubes relax and breathing becomes easier.   After 2 to 12 weeks, lungs can hold more air. Exercise becomes easier and circulation improves.   Quitting will reduce your risk of having a heart attack, stroke, cancer, or lung disease:   After 1 year, the risk of coronary heart disease is cut in half.   After 5 years, the risk of stroke falls to the same as a nonsmoker.   After 10 years, the risk of lung cancer is cut in half and the risk of other cancers decreases significantly.   After 15 years, the risk of coronary heart  disease drops, usually to the level of a nonsmoker.   If you are pregnant, quitting smoking will improve your chances of having a healthy baby.   The people you live with, especially your children, will be healthier.   You will have extra money to spend on things other than cigarettes.  FIVE KEYS TO QUITTING Studies have shown that these 5 steps will help you quit smoking and quit for good. You have the best chances of quitting if you use them together: 1. Get ready.  2. Get support and encouragement.  3. Learn new skills and behaviors.  4. Get medicine to reduce your nicotine addiction and use it correctly.  5. Be prepared for relapse or difficult situations. Be determined to continue trying to quit, even if you do not succeed at first.  1. GET READY  Set a quit date.   Change your environment.   Get rid of ALL cigarettes, ashtrays, matches, and lighters in your home, car, and place of work.   Do not let people smoke in your home.   Review your past attempts to quit. Think about what worked and what did not.   Once you quit, do not smoke. NOT EVEN A PUFF!  2. GET SUPPORT AND ENCOURAGEMENT Studies have shown that you have a better chance of being successful if you have help. You can get support in many ways.  Tell  your family, friends, and coworkers that you are going to quit and need their support. Ask them not to smoke around you.   Talk to your caregivers (doctor, dentist, nurse, pharmacist, psychologist, and/or smoking counselor).   Get individual, group, or telephone counseling and support. The more counseling you have, the better your chances are of quitting. Programs are available at [employer] and health centers. Call your local health department for information about programs in your area.   Spiritual beliefs and practices may help some smokers quit.   Quit meters are Insurance underwriter that keep track of quit statistics, such as amount  of "quit-time," cigarettes not smoked, and money saved.   Many smokers find one or more of the many self-help books available useful in helping them quit and stay off tobacco.  3. LEARN NEW SKILLS AND BEHAVIORS  Try to distract yourself from urges to smoke. Talk to someone, go for a walk, or occupy your time with a task.   When you first try to quit, change your routine. Take a different route to work. Drink tea instead of coffee. Eat breakfast in a different place.   Do something to reduce your stress. Take a hot bath, exercise, or read a book.   Plan something enjoyable to do every day. Reward yourself for not smoking.   Explore interactive web-based programs that specialize in helping you quit.  4. GET MEDICINE AND USE IT CORRECTLY Medicines can help you stop smoking and decrease the urge to smoke. Combining medicine with the above behavioral methods and support can quadruple your chances of successfully quitting smoking. The [place] Food and Drug Administration (FDA) has approved 7 medicines to help you quit smoking. These medicines fall into 3 categories.  Nicotine replacement therapy (delivers nicotine to your body without the negative effects and risks of smoking):   Nicotine gum: Available over-the-counter.   Nicotine lozenges: Available over-the-counter.   Nicotine inhaler: Available by prescription.   Nicotine nasal spray: Available by prescription.   Nicotine skin patches (transdermal): Available by prescription and over-the-counter.   Antidepressant medicine (helps people abstain from smoking, but how this works is unknown):   Bupropion sustained-release (SR) tablets: Available by prescription.   Nicotinic receptor partial agonist (simulates the effect of nicotine in your brain):   Varenicline tartrate tablets: Available by prescription.   Ask your caregiver for advice about which medicines to use and how to use them. Carefully read the information on the package.    Everyone who is trying to quit may benefit from using a medicine. If you are pregnant or trying to become pregnant, nursing an infant, you are under age 74, or you smoke fewer than 10 cigarettes per day, talk to your caregiver before taking any nicotine replacement medicines.   You should stop using a nicotine replacement product and call your caregiver if you experience nausea, dizziness, weakness, vomiting, fast or irregular heartbeat, mouth problems with the lozenge or gum, or redness or swelling of the skin around the patch that does not go away.   Do not use any other product containing nicotine while using a nicotine replacement product.   Talk to your caregiver before using these products if you have diabetes, heart disease, asthma, stomach ulcers, you had a recent heart attack, you have high blood pressure that is not controlled with medicine, a history of irregular heartbeat, or you have been prescribed medicine to help you quit smoking.  5. BE PREPARED FOR RELAPSE OR  DIFFICULT SITUATIONS  Most relapses occur within the first 3 months after quitting. Do not be discouraged if you start smoking again. Remember, most people try several times before they finally quit.   You may have symptoms of withdrawal because your body is used to nicotine. You may crave cigarettes, be irritable, feel very hungry, cough often, get headaches, or have difficulty concentrating.   The withdrawal symptoms are only temporary. They are strongest when you first quit, but they will go away within 10 to 14 days.  Here are some difficult situations to watch for:  Alcohol. Avoid drinking alcohol. Drinking lowers your chances of successfully quitting.   Caffeine. Try to reduce the amount of caffeine you consume. It also lowers your chances of successfully quitting.   Other smokers. Being around smoking can make you want to smoke. Avoid smokers.   Weight gain. Many smokers will gain weight when they quit, usually  less than 10 pounds. Eat a healthy diet and stay active. Do not let weight gain distract you from your main goal, quitting smoking. Some medicines that help you quit smoking may also help delay weight gain. You can always lose the weight gained after you quit.   Bad mood or depression. There are a lot of ways to improve your mood other than smoking.  If you are having problems with any of these situations, talk to your caregiver. SPECIAL SITUATIONS AND CONDITIONS Studies suggest that everyone can quit smoking. Your situation or condition can give you a special reason to quit.  Pregnant women/new mothers: By quitting, you protect your baby's health and your own.   Hospitalized patients: By quitting, you reduce health problems and help healing.   Heart attack patients: By quitting, you reduce your risk of a second heart attack.   Lung, head, and neck cancer patients: By quitting, you reduce your chance of a second cancer.   Parents of children and adolescents: By quitting, you protect your children from illnesses caused by secondhand smoke.  QUESTIONS TO THINK ABOUT Think about the following questions before you try to stop smoking. You may want to talk about your answers with your caregiver.  Why do you want to quit?   If you tried to quit in the past, what helped and what did not?   What will be the most difficult situations for you after you quit? How will you plan to handle them?   Who can help you through the tough times? Your family? Friends? Caregiver?   What pleasures do you get from smoking? What ways can you still get pleasure if you quit?  Here are some questions to ask your caregiver:  How can you help me to be successful at quitting?   What medicine do you think would be best for me and how should I take it?   What should I do if I need more help?   What is smoking withdrawal like? How can I get information on withdrawal?  Quitting takes hard work and a lot of effort,  but you can quit smoking. FOR MORE INFORMATION  Smokefree.gov (Inrails.tn) provides free, accurate, evidence-based information and professional assistance to help support the immediate and long-term needs of people trying to quit smoking. Document Released: 08/09/2001 Document Revised: 08/04/2011 Document Reviewed: 06/01/2009 [facility] Patient Information 2012 [place].  Bacterial Vaginosis Bacterial vaginosis (BV) is a vaginal infection where the normal balance of bacteria in the vagina is disrupted. The normal balance is then replaced by an overgrowth of  certain bacteria. There are several different kinds of bacteria that can cause BV. BV is the most common vaginal infection in women of childbearing age. CAUSES   The cause of BV is not fully understood. BV develops when there is an increase or imbalance of harmful bacteria.   Some activities or behaviors can upset the normal balance of bacteria in the vagina and put women at increased risk including:   Having a new sex partner or multiple sex partners.   Douching.   Using an intrauterine device (IUD) for contraception.   It is not clear what role sexual activity plays in the development of BV. However, women that have never had sexual intercourse are rarely infected with BV.  Women do not get BV from toilet seats, bedding, swimming pools or from touching objects around them.  SYMPTOMS   Grey vaginal discharge.   A fish-like odor with discharge, especially after sexual intercourse.   Itching or burning of the vagina and vulva.   Burning or pain with urination.   Some women have no signs or symptoms at all.  DIAGNOSIS  Your caregiver must examine the vagina for signs of BV. Your caregiver will perform lab tests and look at the sample of vaginal fluid through a microscope. They will look for bacteria and abnormal cells (clue cells), a pH test higher than 4.5, and a positive amine test all associated with BV.    RISKS AND COMPLICATIONS   Pelvic inflammatory disease (PID).   Infections following gynecology surgery.   Developing HIV.   Developing herpes virus.  TREATMENT  Sometimes BV will clear up without treatment. However, all women with symptoms of BV should be treated to avoid complications, especially if gynecology surgery is planned. Female partners generally do not need to be treated. However, BV may spread between female sex partners so treatment is helpful in preventing a recurrence of BV.   BV may be treated with antibiotics. The antibiotics come in either pill or vaginal cream forms. Either can be used with nonpregnant or pregnant women, but the recommended dosages differ. These antibiotics are not harmful to the baby.   BV can recur after treatment. If this happens, a second round of antibiotics will often be prescribed.   Treatment is important for pregnant women. If not treated, BV can cause a premature delivery, especially for a pregnant woman who had a premature birth in the past. All pregnant women who have symptoms of BV should be checked and treated.   For chronic reoccurrence of BV, treatment with a type of prescribed gel vaginally twice a week is helpful.  HOME CARE INSTRUCTIONS   Finish all medication as directed by your caregiver.   Do not have sex until treatment is completed.   Tell your sexual partner that you have a vaginal infection. They should see their caregiver and be treated if they have problems, such as a mild rash or itching.   Practice safe sex. Use condoms. Only have 1 sex partner.  PREVENTION  Basic prevention steps can help reduce the risk of upsetting the natural balance of bacteria in the vagina and developing BV:  Do not have sexual intercourse (be abstinent).   Do not douche.   Use all of the medicine prescribed for treatment of BV, even if the signs and symptoms go away.   Tell your sex partner if you have BV. That way, they can be treated, if  needed, to prevent reoccurrence.  SEEK MEDICAL CARE IF:  Your symptoms are not improving after 3 days of treatment.   You have increased discharge, pain, or fever.  MAKE SURE YOU:   Understand these instructions.   Will watch your condition.   Will get help right away if you are not doing well or get worse.  FOR MORE INFORMATION  Division of STD Prevention (DSTDP), Centers for Disease Control and Prevention: [employer] [place] (ASHA): www.ashastd.org  Document Released: 08/15/2005 Document Revised: 08/04/2011 Document Reviewed: 02/05/2009 [facility] Patient Information 2012 [employer].

## 2011-12-16 ENCOUNTER — Ambulatory Visit: Payer: BC Managed Care – PPO

## 2011-12-16 ENCOUNTER — Other Ambulatory Visit: Payer: Self-pay | Admitting: *Deleted

## 2011-12-16 MED ORDER — LORAZEPAM 1 MG PO TABS: 1.0000 mg | ORAL_TABLET | Freq: Three times a day (TID) | ORAL | Status: DC | PRN

## 2011-12-16 NOTE — Telephone Encounter (Signed)
Ok to call in

## 2011-12-16 NOTE — Telephone Encounter (Signed)
Faxed request from [facility] [address], no last filled date given.

## 2011-12-16 NOTE — Telephone Encounter (Signed)
Rx called in as directed.   

## 2011-12-22 ENCOUNTER — Telehealth: Payer: Self-pay | Admitting: *Deleted

## 2011-12-22 NOTE — Telephone Encounter (Signed)
Pt informed of normal recent pap results.

## 2012-01-03 ENCOUNTER — Ambulatory Visit (INDEPENDENT_AMBULATORY_CARE_PROVIDER_SITE_OTHER): Payer: BC Managed Care – PPO

## 2012-01-03 DIAGNOSIS — N951 Menopausal and female climacteric states: Secondary | ICD-10-CM

## 2012-01-03 DIAGNOSIS — Z13820 Encounter for screening for osteoporosis: Secondary | ICD-10-CM

## 2012-01-12 ENCOUNTER — Other Ambulatory Visit: Payer: Self-pay | Admitting: Family Medicine

## 2012-01-13 ENCOUNTER — Ambulatory Visit: Payer: BC Managed Care – PPO

## 2012-01-13 ENCOUNTER — Ambulatory Visit (INDEPENDENT_AMBULATORY_CARE_PROVIDER_SITE_OTHER): Payer: BC Managed Care – PPO | Admitting: Family Medicine

## 2012-01-13 ENCOUNTER — Inpatient Hospital Stay (HOSPITAL_COMMUNITY)
Admission: EM | Admit: 2012-01-13 | Discharge: 2012-01-16 | DRG: 552 | Disposition: A | Discharge status: Home / Self Care | Payer: BC Managed Care – PPO | Attending: Internal Medicine | Admitting: Internal Medicine

## 2012-01-13 ENCOUNTER — Emergency Department (HOSPITAL_COMMUNITY): Payer: BC Managed Care – PPO

## 2012-01-13 ENCOUNTER — Encounter: Payer: Self-pay | Admitting: Family Medicine

## 2012-01-13 ENCOUNTER — Encounter: Payer: Self-pay | Admitting: *Deleted

## 2012-01-13 ENCOUNTER — Ambulatory Visit (INDEPENDENT_AMBULATORY_CARE_PROVIDER_SITE_OTHER)
Admission: RE | Admit: 2012-01-13 | Discharge: 2012-01-13 | Disposition: A | Discharge status: Home / Self Care | Payer: BC Managed Care – PPO | Source: Ambulatory Visit | Attending: Family Medicine | Admitting: Family Medicine

## 2012-01-13 ENCOUNTER — Encounter (HOSPITAL_COMMUNITY): Payer: Self-pay

## 2012-01-13 ENCOUNTER — Telehealth: Payer: Self-pay | Admitting: Family Medicine

## 2012-01-13 VITALS — BP 118/78 | HR 88 | Temp 98.6°F | Wt 153.4 lb

## 2012-01-13 DIAGNOSIS — K5010 Crohn's disease of large intestine without complications: Principal | ICD-10-CM | POA: Diagnosis present

## 2012-01-13 DIAGNOSIS — J189 Pneumonia, unspecified organism: Secondary | ICD-10-CM | POA: Diagnosis present

## 2012-01-13 DIAGNOSIS — B951 Streptococcus, group B, as the cause of diseases classified elsewhere: Secondary | ICD-10-CM | POA: Diagnosis present

## 2012-01-13 DIAGNOSIS — K56600 Partial intestinal obstruction, unspecified as to cause: Secondary | ICD-10-CM

## 2012-01-13 DIAGNOSIS — Z8673 Personal history of transient ischemic attack (TIA), and cerebral infarction without residual deficits: Secondary | ICD-10-CM

## 2012-01-13 DIAGNOSIS — K5090 Crohn's disease, unspecified, without complications: Secondary | ICD-10-CM

## 2012-01-13 DIAGNOSIS — R109 Unspecified abdominal pain: Secondary | ICD-10-CM | POA: Insufficient documentation

## 2012-01-13 DIAGNOSIS — F17200 Nicotine dependence, unspecified, uncomplicated: Secondary | ICD-10-CM | POA: Diagnosis present

## 2012-01-13 DIAGNOSIS — M129 Arthropathy, unspecified: Secondary | ICD-10-CM | POA: Diagnosis present

## 2012-01-13 DIAGNOSIS — Z87891 Personal history of nicotine dependence: Secondary | ICD-10-CM | POA: Diagnosis present

## 2012-01-13 DIAGNOSIS — R112 Nausea with vomiting, unspecified: Secondary | ICD-10-CM

## 2012-01-13 DIAGNOSIS — Z7982 Long term (current) use of aspirin: Secondary | ICD-10-CM

## 2012-01-13 DIAGNOSIS — E538 Deficiency of other specified B group vitamins: Secondary | ICD-10-CM | POA: Diagnosis present

## 2012-01-13 DIAGNOSIS — R1031 Right lower quadrant pain: Secondary | ICD-10-CM | POA: Diagnosis present

## 2012-01-13 DIAGNOSIS — N390 Urinary tract infection, site not specified: Secondary | ICD-10-CM | POA: Diagnosis present

## 2012-01-13 DIAGNOSIS — Z8541 Personal history of malignant neoplasm of cervix uteri: Secondary | ICD-10-CM

## 2012-01-13 DIAGNOSIS — F341 Dysthymic disorder: Secondary | ICD-10-CM | POA: Diagnosis present

## 2012-01-13 DIAGNOSIS — K5669 Other intestinal obstruction: Secondary | ICD-10-CM | POA: Diagnosis present

## 2012-01-13 DIAGNOSIS — Z79899 Other long term (current) drug therapy: Secondary | ICD-10-CM

## 2012-01-13 LAB — CBC WITH DIFFERENTIAL/PLATELET
Basophils Absolute: 0 10*3/uL (ref 0.0–0.1)
Eosinophils Absolute: 0 10*3/uL (ref 0.0–0.7)
Hemoglobin: 14.1 g/dL (ref 12.0–15.0)
Lymphocytes Relative: 19.5 % (ref 12.0–46.0)
Lymphs Abs: 2.1 10*3/uL (ref 0.7–4.0)
MCHC: 33.3 g/dL (ref 30.0–36.0)
MCV: 97.9 fl (ref 78.0–100.0)
Monocytes Absolute: 0.8 10*3/uL (ref 0.1–1.0)
Neutro Abs: 7.7 10*3/uL (ref 1.4–7.7)
RDW: 13.6 % (ref 11.5–14.6)

## 2012-01-13 LAB — URINALYSIS, ROUTINE W REFLEX MICROSCOPIC
Bilirubin Urine: NEGATIVE
Protein, ur: NEGATIVE mg/dL
Urobilinogen, UA: 0.2 mg/dL (ref 0.0–1.0)

## 2012-01-13 LAB — URINE MICROSCOPIC-ADD ON

## 2012-01-13 LAB — COMPREHENSIVE METABOLIC PANEL
ALT: 21 U/L (ref 0–35)
ALT: 21 U/L (ref 0–35)
AST: 29 U/L (ref 0–37)
AST: 24 U/L (ref 0–37)
Alkaline Phosphatase: 89 U/L (ref 39–117)
CO2: 28 mEq/L (ref 19–32)
Calcium: 8.9 mg/dL (ref 8.4–10.5)
Chloride: 104 mEq/L (ref 96–112)
Chloride: 102 mEq/L (ref 96–112)
Creatinine, Ser: 0.8 mg/dL (ref 0.4–1.2)
GFR calc non Af Amer: 82 mL/min — ABNORMAL LOW (ref >90)
Potassium: 4.2 mEq/L (ref 3.5–5.1)
Sodium: 140 mEq/L (ref 135–145)
Total Bilirubin: 0.6 mg/dL (ref 0.3–1.2)

## 2012-01-13 LAB — DIFFERENTIAL
Basophils Absolute: 0 10*3/uL (ref 0.0–0.1)
Lymphocytes Relative: 26 % (ref 12–46)
Neutro Abs: 5.9 10*3/uL (ref 1.7–7.7)

## 2012-01-13 LAB — CBC
HCT: 40.6 % (ref 36.0–46.0)
Platelets: 203 10*3/uL (ref 150–400)
RDW: 12.9 % (ref 11.5–15.5)
WBC: 8.9 10*3/uL (ref 4.0–10.5)

## 2012-01-13 MED ORDER — ALUM & MAG HYDROXIDE-SIMETH 200-200-20 MG/5ML PO SUSP: 30.0000 mL | Freq: Four times a day (QID) | ORAL | Status: DC | PRN

## 2012-01-13 MED ORDER — ONDANSETRON HCL 4 MG PO TABS: 4.0000 mg | ORAL_TABLET | Freq: Four times a day (QID) | ORAL | Status: DC | PRN

## 2012-01-13 MED ORDER — ACETAMINOPHEN 325 MG PO TABS: 650.0000 mg | ORAL_TABLET | Freq: Four times a day (QID) | ORAL | Status: DC | PRN

## 2012-01-13 MED ORDER — SODIUM CHLORIDE 0.9 % IV SOLN
INTRAVENOUS | Status: DC
  Administered 2012-01-13 – 2012-01-15 (×2): via INTRAVENOUS

## 2012-01-13 MED ORDER — ONDANSETRON HCL 4 MG/2ML IJ SOLN
4.0000 mg | Freq: Once | INTRAMUSCULAR | Status: AC
  Administered 2012-01-13: 4 mg via INTRAVENOUS
  Filled 2012-01-13: qty 2

## 2012-01-13 MED ORDER — IOHEXOL 300 MG/ML  SOLN
80.0000 mL | Freq: Once | INTRAMUSCULAR | Status: AC | PRN
  Administered 2012-01-13: 80 mL via INTRAVENOUS

## 2012-01-13 MED ORDER — ENOXAPARIN SODIUM 40 MG/0.4ML ~~LOC~~ SOLN
40.0000 mg | SUBCUTANEOUS | Status: DC
  Administered 2012-01-13 – 2012-01-15 (×3): 40 mg via SUBCUTANEOUS
  Filled 2012-01-13 (×4): qty 0.4

## 2012-01-13 MED ORDER — ONDANSETRON HCL 4 MG/2ML IJ SOLN
4.0000 mg | Freq: Four times a day (QID) | INTRAMUSCULAR | Status: DC | PRN
  Administered 2012-01-13 – 2012-01-16 (×7): 4 mg via INTRAVENOUS
  Filled 2012-01-13 (×7): qty 2

## 2012-01-13 MED ORDER — METRONIDAZOLE IN NACL 5-0.79 MG/ML-% IV SOLN
500.0000 mg | Freq: Three times a day (TID) | INTRAVENOUS | Status: DC
  Administered 2012-01-13 – 2012-01-16 (×8): 500 mg via INTRAVENOUS
  Filled 2012-01-13 (×12): qty 100

## 2012-01-13 MED ORDER — METHYLPREDNISOLONE SODIUM SUCC 125 MG IJ SOLR
125.0000 mg | Freq: Two times a day (BID) | INTRAMUSCULAR | Status: AC
  Administered 2012-01-13 – 2012-01-14 (×2): 125 mg via INTRAVENOUS
  Filled 2012-01-13 (×2): qty 2

## 2012-01-13 MED ORDER — PROMETHAZINE HCL 25 MG/ML IJ SOLN
25.0000 mg | Freq: Once | INTRAMUSCULAR | Status: AC
  Administered 2012-01-13: 25 mg via INTRAMUSCULAR

## 2012-01-13 MED ORDER — ACETAMINOPHEN 650 MG RE SUPP: 650.0000 mg | Freq: Four times a day (QID) | RECTAL | Status: DC | PRN

## 2012-01-13 MED ORDER — HYDROMORPHONE HCL PF 1 MG/ML IJ SOLN
1.0000 mg | Freq: Once | INTRAMUSCULAR | Status: AC
  Administered 2012-01-13: 1 mg via INTRAVENOUS
  Filled 2012-01-13: qty 1

## 2012-01-13 MED ORDER — SODIUM CHLORIDE 0.9 % IV SOLN
INTRAVENOUS | Status: AC
  Administered 2012-01-13: 22:00:00 via INTRAVENOUS

## 2012-01-13 MED ORDER — HYDROMORPHONE HCL PF 1 MG/ML IJ SOLN
0.5000 mg | INTRAMUSCULAR | Status: DC | PRN
  Administered 2012-01-14 – 2012-01-16 (×7): 1 mg via INTRAVENOUS
  Filled 2012-01-13 (×7): qty 1

## 2012-01-13 MED ORDER — SODIUM CHLORIDE 0.9 % IV SOLN: INTRAVENOUS | Status: DC

## 2012-01-13 MED ORDER — CYANOCOBALAMIN 1000 MCG/ML IJ SOLN
1000.0000 ug | Freq: Once | INTRAMUSCULAR | Status: AC
  Administered 2012-01-13: 1000 ug via INTRAMUSCULAR

## 2012-01-13 MED ORDER — CIPROFLOXACIN IN D5W 400 MG/200ML IV SOLN
400.0000 mg | Freq: Two times a day (BID) | INTRAVENOUS | Status: DC
  Administered 2012-01-13 – 2012-01-16 (×6): 400 mg via INTRAVENOUS
  Filled 2012-01-13 (×8): qty 200

## 2012-01-13 MED ORDER — SODIUM CHLORIDE 0.9 % IV BOLUS (SEPSIS)
1000.0000 mL | Freq: Once | INTRAVENOUS | Status: AC
  Administered 2012-01-13: 1000 mL via INTRAVENOUS

## 2012-01-13 MED ORDER — ZOLPIDEM TARTRATE 5 MG PO TABS
5.0000 mg | ORAL_TABLET | Freq: Every evening | ORAL | Status: DC | PRN
  Administered 2012-01-13 – 2012-01-15 (×3): 5 mg via ORAL
  Filled 2012-01-13 (×3): qty 1

## 2012-01-13 NOTE — ED Notes (Signed)
Patient drinking oral contrast at this time, patient states no nausea at this time.

## 2012-01-13 NOTE — ED Provider Notes (Signed)
Patient sent here by PCP to ED to ro bowel obstruction. Pt originally seen by Dr. Eulis Foster and placed in CDU to wait for scan results.   Sharon [name] is a 56 y.o. female with a h/o chron's disease who presents to the Emergency Department complaining of 2 to 3 weeks of RLQ abdominal pain that radiates to all other quadrants with associated 2 weeks of bloating and intermittent chills. She also c/o nausea and emesis that started yesterday. She reports two episodes of emesis described as bile contents today. She reports that she has been having smaller and softer than normal BMs that are occasioanlly diarrhea described as watery and that she is not going for 2 to 3 days at a time. She states that she normally goes 4 to 5 times a time. Pt was sent from her PCP office due to the symptoms being from a possible blockage and pt states that she needs an endoscopy. Pt states that she had one abdomen x-ray done and had blood drawn at her PCP appointment today but was told that the blood results would not be in until Monday. She denies hematochezia, diaphoresis, cough, and chest pain. She has a h/o cervical CA, TIA, arthritis and syphilis. She is a current everyday smoker but denies alcohol use.   Results for orders placed during the hospital encounter of 01/13/12  CBC      Component Value Range   WBC 8.9  4.0 - 10.5 (K/uL)   RBC 4.27  3.87 - 5.11 (MIL/uL)   Hemoglobin 13.8  12.0 - 15.0 (g/dL)   HCT 40.6  36.0 - 46.0 (%)   MCV 95.1  78.0 - 100.0 (fL)   MCH 32.3  26.0 - 34.0 (pg)   MCHC 34.0  30.0 - 36.0 (g/dL)   RDW 12.9  11.5 - 15.5 (%)   Platelets 203  150 - 400 (K/uL)  DIFFERENTIAL      Component Value Range   Neutrophils Relative 66  43 - 77 (%)   Neutro Abs 5.9  1.7 - 7.7 (K/uL)   Lymphocytes Relative 26  12 - 46 (%)   Lymphs Abs 2.3  0.7 - 4.0 (K/uL)   Monocytes Relative 8  3 - 12 (%)   Monocytes Absolute 0.7  0.1 - 1.0 (K/uL)   Eosinophils Relative 0  0 - 5 (%)   Eosinophils Absolute 0.0  0.0 -  0.7 (K/uL)   Basophils Relative 0  0 - 1 (%)   Basophils Absolute 0.0  0.0 - 0.1 (K/uL)  COMPREHENSIVE METABOLIC PANEL      Component Value Range   Sodium 140  135 - 145 (mEq/L)   Potassium 4.3  3.5 - 5.1 (mEq/L)   Chloride 102  96 - 112 (mEq/L)   CO2 28  19 - 32 (mEq/L)   Glucose, Bld 89  70 - 99 (mg/dL)   BUN 9  6 - 23 (mg/dL)   Creatinine, Ser 0.80  0.50 - 1.10 (mg/dL)   Calcium 9.1  8.4 - 10.5 (mg/dL)   Total Protein 7.2  6.0 - 8.3 (g/dL)   Albumin 3.6  3.5 - 5.2 (g/dL)   AST 24  0 - 37 (U/L)   ALT 21  0 - 35 (U/L)   Alkaline Phosphatase 97  39 - 117 (U/L)   Total Bilirubin 0.6  0.3 - 1.2 (mg/dL)   GFR calc non Af Amer 82 (*) >90 (mL/min)   GFR calc Af Amer >90  >90 (mL/min)  OCCULT BLOOD, POC DEVICE      Component Value Range   Fecal Occult Bld NEGATIVE     Dg Abd Acute W/chest  01/13/2012  *RADIOLOGY REPORT*  Clinical Data: Abdominal pain, concern for obstruction  ACUTE ABDOMEN SERIES (ABDOMEN 2 VIEW & CHEST 1 VIEW)  Comparison: 06/05/2011  Findings: Cardiomediastinal silhouette is stable.  No acute infiltrate or pleural effusion.  No pulmonary edema.  Mild distended small bowel loops mid abdomen with air-fluid level suspicious for early bowel obstruction or ileus.  No free abdominal air.  IMPRESSION: Mild distended small bowel loops mid abdomen with air-fluid level suspicious for early bowel obstruction or ileus.  Original Report Authenticated By: Lahoma Crocker, M.D.    Patients labs done at PCP so they were not drawn in ED. No leukocytosis, fecal occult negative. Currently awaiting results of abd/pelv CT for further management and treatment. PT currently in NAD and VSS.    5:27 PM    Patients CT shows Crohn's flair with partial bowel obstruction. Pt is agreeable to being admitted. Still having significant pain. 7:58 PM  Patient admitted to [facility] as an unassigned patient. Team 8, inpatient, medsurg, Dr. Sarajane Jews. IV Flagyl and cipro has been initiated. 8:30PM  Linus Mako, PA 01/13/12 2030

## 2012-01-13 NOTE — H&P (Addendum)
DATE OF ADMISSION:  01/13/2012  PCP:    Ria Bush, MD, MD   Chief Complaint: ABD Pain   HPI: Sharon [name] is an 56 y.o. female with a history of Crohn's Disease since the year 2000 who presents to the Ed with complaints of severe unremitting RLQ ABD Pain for the past 2-3 weeks which worsened over the past 24 hours.  She reports having nausea and vomiting  Today. She denies any bloody stools.  She describes the pain as being crampy, and rates the intensity of the pain as an 8 out of 10.  She denies any fevers or chills.  In the ED a CT scan of the ABD was performed and she was found to have evidence of Crohn's Colitis and a partial SBO.  She was referred for admission.  Her Gastroenterologist is Dr. [name].    Past Medical History  Diagnosis Date  . Elevated blood pressure   . Migraines     and frequent other headaches (sinus or stress)  . Crohn's disease     h/o stenotic crohn's, active in TI, started [place], PPD neg ([facility] Dr. [name])  . Cervical cancer 12/2008    s/p hysterectomy  . History of chicken pox   . Anxiety and depression   . History of anemia     attributed to crohn's  . Arthritis   . Genital warts   . History of syphilis 1980s  . Smoker   . TIA (transient ischemic attack) 05/2010    TIA vs complex migraine (w/u negative - carotids, echo, TC doppler, and MRI normal)  . Pancytopenia     mild, found in hospital  . Hepatic steatosis 04/2011    diffuse on CT, 21m gallbladder polyp  . Vitamin B12 deficiency     Past Surgical History  Procedure Date  . Appendectomy 1998  . Right colectomy 2000    crohn's disease, ileocecal resection  . Hospitalization 05/2010    TIA vs complex migraine - w/u negative - head CT, MRI/MRA, carotid dopplers, echo.  treated with ASA and tramadol  . [place] 05[place]    nl LV fxn ,EF 60%  . Ct abd wo & pelvis w cm 02/2011    nothing acute, chronic terminal ileitis  . [place]    diffuse hepatic steatosis,  561mpolyp in gallbaldder fundus, no stones, mod abd ath  . Colonoscopy 10/2008    ileo-colonic anastomosis, ielocolonic crohn's  . Colonoscopy 05/2011    ileo-colonic anastomosis normal, normal mucosa  . Esophagogastroduodenoscopy 05/2011    nl esophagus, gastritis, nl duodenum  . Cervical biopsy  w/ loop electrode excision 10/2008  . Vaginal hysterectomy 5/`13/2010    LAVH/BSO    Medications:  HOME MEDS: Prior to Admission medications   Medication Sig Start Date End Date Taking? Authorizing Provider  acetaminophen (TYLENOL) 500 MG tablet Take 1,000 mg by mouth every 6 (six) hours as needed. For pain   Yes Historical Provider, MD  amitriptyline (ELAVIL) 25 MG tablet Take 12.5 mg by mouth at bedtime.   Yes Historical Provider, MD  aspirin EC 81 MG tablet Take 1 tablet (81 mg total) by mouth daily. 05/16/11 05/15/12 Yes JaRia BushMD  Certolizumab Pegol (CIMZIA [place]) Inject 200 mg into the skin every 30 (thirty) days. Takes every 4th Saturday.  Next dose due 01/21/2012.   Yes Historical Provider, MD  citalopram (CELEXA) 40 MG tablet Take 40 mg by mouth daily.   Yes Historical Provider, MD  cyanocobalamin (,VITAMIN B-12,) 1000 MCG/ML injection Inject 1,000 mcg into the muscle every 30 (thirty) days. Last dose 01/13/2012.   Yes Historical Provider, MD  LORazepam (ATIVAN) 1 MG tablet Take 1 mg by mouth 3 (three) times daily as needed. For anxiety.   Yes Historical Provider, MD  metoprolol succinate (TOPROL-XL) 25 MG 24 hr tablet Take 25 mg by mouth daily.   Yes Historical Provider, MD  omeprazole (PRILOSEC) 40 MG capsule Take 40 mg by mouth 2 (two) times daily.     Yes Historical Provider, MD  valACYclovir (VALTREX) 500 MG tablet TAKE 1 TABLET BY MOUTH EVERY DAY 01/12/12  Yes Ria Bush, MD    Allergies:  Allergies  Allergen Reactions  . Codeine Nausea And Vomiting  . Remeron (Mirtazapine)     Knocked out    Social History:   reports that she has been smoking Cigarettes.  She has  a 5 pack-year smoking history. She has never used smokeless tobacco. She reports that she does not drink alcohol or use illicit drugs.  Family History: Family History  Problem Relation Age of Onset  . Crohn's disease Daughter     crohn's, ulcerative colitis  . Ulcerative colitis Daughter   . Stroke Mother   . Hypertension Mother   . Hyperlipidemia Mother   . Hypertension Father   . Crohn's disease Father   . Crohn's disease Sister   . Cancer Sister     ovarian  . Crohn's disease Paternal Aunt   . Crohn's disease Paternal Uncle   . Diabetes Neg Hx     Review of Systems:  The patient denies anorexia, fever, weight loss, vision loss, decreased hearing, hoarseness, chest pain, syncope, dyspnea on exertion, peripheral edema, balance deficits, hemoptysis, abdominal pain, melena, hematochezia, severe indigestion/heartburn, hematuria, incontinence, genital sores, muscle weakness, suspicious skin lesions, transient blindness, difficulty walking, depression, unusual weight change, abnormal bleeding, enlarged lymph nodes, angioedema, and breast masses.   Physical Exam:  GEN:  Pleasant 56 year old Obese Caucasian Female examined  and in discomfort but no acute distress; cooperative with exam Filed Vitals:   01/13/12 1358 01/13/12 1835  BP: 125/80 123/54  Pulse: 86 74  Temp: 98.3 F (36.8 C)   TempSrc: Oral   Resp: 20 18  SpO2: 98% 96%   Blood pressure 123/54, pulse 74, temperature 98.3 F (36.8 C), temperature source Oral, resp. rate 18, SpO2 96.00%. PSYCH: She is alert and oriented x4; does not appear anxious does not appear depressed; affect is normal HEENT: Normocephalic and Atraumatic, Mucous membranes pink; PERRLA; EOM intact; Fundi:  Benign;  No scleral icterus, Nares: Patent, Oropharynx: Clear, Fair Dentition, Neck:  FROM, no cervical lymphadenopathy nor thyromegaly or carotid bruit; no JVD; Breasts:: Not examined CHEST WALL: No tenderness CHEST: Normal respiration, clear to  auscultation bilaterally HEART: Regular rate and rhythm; no murmurs rubs or gallops BACK: No kyphosis or scoliosis; no CVA tenderness ABDOMEN: Positive Bowel Sounds, Scaphoid, Obese, soft non-tender; no masses, no organomegaly.   Rectal Exam: Not done EXTREMITIES: No bone or joint deformity; age-appropriate arthropathy of the hands and knees; no cyanosis, clubbing or edema; no ulcerations. Genitalia: not examined PULSES: 2+ and symmetric SKIN: Normal hydration no rash or ulceration CNS: Cranial nerves 2-12 grossly intact no focal neurologic deficit   Labs & Imaging Results for orders placed during the hospital encounter of 01/13/12 (from the past 48 hour(s))  CBC     Status: Normal   Collection Time   01/13/12  3:11 PM  Component Value Range Comment   WBC 8.9  4.0 - 10.5 (K/uL)    RBC 4.27  3.87 - 5.11 (MIL/uL)    Hemoglobin 13.8  12.0 - 15.0 (g/dL)    HCT 40.6  36.0 - 46.0 (%)    MCV 95.1  78.0 - 100.0 (fL)    MCH 32.3  26.0 - 34.0 (pg)    MCHC 34.0  30.0 - 36.0 (g/dL)    RDW 12.9  11.5 - 15.5 (%)    Platelets 203  150 - 400 (K/uL)   DIFFERENTIAL     Status: Normal   Collection Time   01/13/12  3:11 PM      Component Value Range Comment   Neutrophils Relative 66  43 - 77 (%)    Neutro Abs 5.9  1.7 - 7.7 (K/uL)    Lymphocytes Relative 26  12 - 46 (%)    Lymphs Abs 2.3  0.7 - 4.0 (K/uL)    Monocytes Relative 8  3 - 12 (%)    Monocytes Absolute 0.7  0.1 - 1.0 (K/uL)    Eosinophils Relative 0  0 - 5 (%)    Eosinophils Absolute 0.0  0.0 - 0.7 (K/uL)    Basophils Relative 0  0 - 1 (%)    Basophils Absolute 0.0  0.0 - 0.1 (K/uL)   COMPREHENSIVE METABOLIC PANEL     Status: Abnormal   Collection Time   01/13/12  3:11 PM      Component Value Range Comment   Sodium 140  135 - 145 (mEq/L)    Potassium 4.3  3.5 - 5.1 (mEq/L)    Chloride 102  96 - 112 (mEq/L)    CO2 28  19 - 32 (mEq/L)    Glucose, Bld 89  70 - 99 (mg/dL)    BUN 9  6 - 23 (mg/dL)    Creatinine, Ser 0.80  0.50 -  1.10 (mg/dL)    Calcium 9.1  8.4 - 10.5 (mg/dL)    Total Protein 7.2  6.0 - 8.3 (g/dL)    Albumin 3.6  3.5 - 5.2 (g/dL)    AST 24  0 - 37 (U/L)    ALT 21  0 - 35 (U/L)    Alkaline Phosphatase 97  39 - 117 (U/L)    Total Bilirubin 0.6  0.3 - 1.2 (mg/dL)    GFR calc non Af Amer 82 (*) >90 (mL/min)    GFR calc Af Amer >90  >90 (mL/min)   OCCULT BLOOD, POC DEVICE     Status: Normal   Collection Time   01/13/12  3:19 PM      Component Value Range Comment   Fecal Occult Bld NEGATIVE     URINALYSIS, ROUTINE W REFLEX MICROSCOPIC     Status: Abnormal   Collection Time   01/13/12  5:05 PM      Component Value Range Comment   Color, Urine YELLOW  YELLOW     APPearance HAZY (*) CLEAR     Specific Gravity, Urine 1.006  1.005 - 1.030     pH 6.0  5.0 - 8.0     Glucose, UA NEGATIVE  NEGATIVE (mg/dL)    Hgb urine dipstick TRACE (*) NEGATIVE     Bilirubin Urine NEGATIVE  NEGATIVE     Ketones, ur NEGATIVE  NEGATIVE (mg/dL)    Protein, ur NEGATIVE  NEGATIVE (mg/dL)    Urobilinogen, UA 0.2  0.0 - 1.0 (mg/dL)    Nitrite NEGATIVE  NEGATIVE     Leukocytes, UA LARGE (*) NEGATIVE    URINE MICROSCOPIC-ADD ON     Status: Abnormal   Collection Time   01/13/12  5:05 PM      Component Value Range Comment   Squamous Epithelial / LPF MANY (*) RARE     WBC, UA 21-50  <3 (WBC/hpf)    Bacteria, UA FEW (*) RARE     Ct Abdomen Pelvis W Contrast  01/13/2012  *RADIOLOGY REPORT*  Clinical Data: Abdominal pain.  History of Crohn disease.  Status post appendectomy and bowel resections.  History of hysterectomy.  CT ABDOMEN AND PELVIS WITH CONTRAST  Technique:  Multidetector CT imaging of the abdomen and pelvis was performed following the standard protocol during bolus administration of intravenous contrast.  Contrast: 90m OMNIPAQUE IOHEXOL 300 MG/ML  SOLN  Comparison: CT of abdomen and pelvis 11/15/2011.  Findings:  Lung Bases: Minimal dependent atelectasis in the lower lobes of the lungs bilaterally.  Otherwise,  unremarkable.  Abdomen/Pelvis:  The enhanced appearance of the liver, gallbladder, pancreas, spleen, bilateral adrenal glands and bilateral kidneys is unremarkable.  There are numerous mildly dilated loops of small bowel, predominantly in the region of the distal jejunum and distal ileum, measuring up to 3.4 cm in diameter.  These are filled with oral contrast and have air fluid levels present.  The distal ileum appears markedly thickened and there are some surrounding inflammatory changes, likely from an acute exacerbation of the patient's known Crohn disease.  Postoperative changes of appendectomy are noted.  There is a small volume of free fluid within the cul-de-sac, presumably reactive to inflammation.  No larger volume of ascites is noted.  No pneumoperitoneum. Atherosclerotic calcifications are noted throughout the abdominal and pelvic vasculature, without evidence of aneurysm.  The patient is status post hysterectomy.  The ovaries are not well visualized and may either be atrophic or may be surgically removed.  Urinary bladder is unremarkable in appearance.  Musculoskeletal: There are no aggressive appearing lytic or blastic lesions noted in the visualized portions of the skeleton.  IMPRESSION: 1.  Findings, as above, compatible with acute exacerbation of the patient's known Crohn disease with extensive thickening and inflammation of the distal ileum.  At this time, there is mild dilatation of portions of the distal jejunum and ileum, suggesting some partial obstruction related to the inflammatory changes in the distal ileum. 2.  Small volume of free fluid the pelvis is presumably reactive. 3.  Atherosclerosis. 4.  Status post hysterectomy and appendectomy.  Original Report Authenticated By: DEtheleen Mayhew M.D.   Dg Abd Acute W/chest  01/13/2012  *RADIOLOGY REPORT*  Clinical Data: Abdominal pain, concern for obstruction  ACUTE ABDOMEN SERIES (ABDOMEN 2 VIEW & CHEST 1 VIEW)  Comparison: 06/05/2011   Findings: Cardiomediastinal silhouette is stable.  No acute infiltrate or pleural effusion.  No pulmonary edema.  Mild distended small bowel loops mid abdomen with air-fluid level suspicious for early bowel obstruction or ileus.  No free abdominal air.  IMPRESSION: Mild distended small bowel loops mid abdomen with air-fluid level suspicious for early bowel obstruction or ileus.  Original Report Authenticated By: LLahoma Crocker M.D.      Assessment: Present on Admission:  .Crohn's colitis .Abdominal pain, acute, right lower quadrant .Crohn's disease .Smoker Nausea and Vomiting  Plan:      Admit to Med/Surg Bed Pain Control, IV Cipro and Flagyl and IV Steroid Taper, and Clear liquid Diet Pain Control, and Anti-Emetics PRN Reconcile Home medications Tobacco Cessation  counseling.   DVT prophylaxis.   Other plans as per orders.    CODE STATUS:      FULL CODE       Braylon Lemmons C 01/13/2012, 10:02 PM

## 2012-01-13 NOTE — Assessment & Plan Note (Addendum)
H/o stenotic crohn's disease and active disease at terminal ileum. Last BM was Wednesday morning, not able to pass any gas. Blood work today. Checked acute abd series - concern for air fluid levels in small bowels. Significant abd pain and some guarding on exam. Discussed with pt different options - home with Entocort or to hospital.  Pt prefers ER eval if will speed recovery. Will send to ER today for further evaluation of possible early obstruction - may need NGT vs further intervention depending on how process progresses.

## 2012-01-13 NOTE — Progress Notes (Signed)
Subjective:    Patient ID: Sharon [name], female    DOB: [birth year], 56 y.o.   MRN: [id number]  HPI CC: abd pain, diarrhea, bloated  Pleasant 56 yo pt of mine with h/o Crohn's disease (h/o stenotic crohn's, active in TI, started [place], PPD neg ([facility] Dr. [name])) and anxiety presents with concern for crohn's flare.  Sxs started 3d ago - nausea/vomiting, nonbloody, some bile, BM changes over last week - more trouble stooling and when did go - large amt diarrhea, no blood.  + mucous.  Took miralax on Monday with diarrhea afterwards.  Abd pain started yesterday - described as stomach spasm and cramping throughout abdomen.  Very sore LLQ.  Took tylenol #3 at work which did help some but when got home pain worse and tylenol #3 not cutting it.  Felt like prior crohn's flare.  Today pain some better compared to yesterday.  Mainly here requesting stronger pain medicine.  May have had chills last few days.  Feeling nauseated currently.  Able to keep food and fluid down currently.  Thinks staying hydrated.  Last stool was Wednesday morning.  Not passing gas currently.    She is on monthly cimzia for crohn's disease.  Last on entocort 10/2011 for 3 weeks.  Has been off steroids for last 3-4 wks.  Was unable to get in with Dr. [name].  No upcoming appt.  Increased anxiety at work.  Also stressed because starting school on Monday.  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  Taking ativan tid scheduled.  Taking celexa 50m daily.  Taking amitriptyline 12.5646mat bedtime for migraine prevention.  Thinks a lot of this is stress/anxiety.  Medications and allergies reviewed and updated in chart.  Past histories reviewed and updated if relevant as below. Patient Active Problem List  Diagnoses  . HTN (hypertension)  . Migraines  . Crohn's  . Anxiety and depression  . Smoker  . TIA (transient ischemic attack)  . Healthcare maintenance  . Vitamin B12 deficiency  . Leukopenia  . Anxiety attack   . Headache   Past Medical History  Diagnosis Date  . Elevated blood pressure   . Migraines     and frequent other headaches (sinus or stress)  . Crohn's disease     h/o stenotic crohn's, active in [place], [place] ([facility] Dr. EdOletta Lamas . Cervical cancer 12/2008    s/p hysterectomy  . History of chicken pox   . Anxiety and depression   . History of anemia     attributed to crohn's  . Arthritis   . Genital warts   . History of syphilis 1980s  . Smoker   . TIA (transient ischemic attack) 05/2010    TIA vs complex migraine (w/u negative - carotids, echo, TC doppler, and MRI normal)  . Pancytopenia     mild, found in hospital  . Hepatic steatosis 04/2011    diffuse on CT, 46m50mallbladder polyp  . Vitamin B12 deficiency    Past Surgical History  Procedure Date  . Appendectomy 1998  . Right colectomy 2000    crohn's disease, ileocecal resection  . Hospitalization 05/2010    TIA vs complex migraine - w/u negative - head CT, MRI/MRA, carotid dopplers, echo.  treated with ASA and tramadol  . [place] 05[place]    nl LV fxn ,EF 60%  . Ct abd wo & pelvis w cm 02/2011    nothing acute, chronic terminal ileitis  .  Abd [place] 04/2011    diffuse hepatic steatosis, 89m polyp in gallbaldder fundus, no stones, mod abd ath  . Colonoscopy 10/2008    ileo-colonic anastomosis, ielocolonic crohn's  . Colonoscopy 05/2011    ileo-colonic anastomosis normal, normal mucosa  . Esophagogastroduodenoscopy 05/2011    nl esophagus, gastritis, nl duodenum  . Cervical biopsy  w/ loop electrode excision 10/2008  . Vaginal hysterectomy 5/`13/2010    LAVH/BSO   History  Substance Use Topics  . Smoking status: Current Everyday Smoker -- 0.5 packs/day    Types: Cigarettes  . Smokeless tobacco: Never Used  . Alcohol Use: No     remote EtOh use   Family History  Problem Relation Age of Onset  . Crohn's disease Daughter     crohn's, ulcerative colitis  . Ulcerative colitis Daughter     . Stroke Mother   . Hypertension Mother   . Hyperlipidemia Mother   . Hypertension Father   . Crohn's disease Father   . Crohn's disease Sister   . Cancer Sister     ovarian  . Crohn's disease Paternal Aunt   . Crohn's disease Paternal Uncle   . Diabetes Neg Hx    Allergies  Allergen Reactions  . Codeine Nausea And Vomiting  . Remeron (Mirtazapine)     Knocked out   Current Outpatient Prescriptions on File Prior to Visit  Medication Sig Dispense Refill  . acetaminophen (TYLENOL) 500 MG tablet Take 1,000 mg by mouth every 6 (six) hours as needed. For pain      . amitriptyline (ELAVIL) 25 MG tablet Take 12.5 mg by mouth at bedtime. For headache prevention      . Certolizumab Pegol (CIMZIA [place]) Inject into the skin every 30 (thirty) days.      . citalopram (CELEXA) 40 MG tablet TAKE 1 TABLET BY MOUTH DAILY  30 tablet  3  . cyanocobalamin (,VITAMIN B-12,) 1000 MCG/ML injection Inject 1,000 mcg into the muscle every 30 (thirty) days.        .Marland KitchenLORazepam (ATIVAN) 1 MG tablet Take 1 tablet (1 mg total) by mouth 3 (three) times daily as needed for anxiety.  90 tablet  0  . metoprolol succinate (TOPROL-XL) 25 MG 24 hr tablet Take 1 tablet (25 mg total) by mouth daily.  30 tablet  6  . omeprazole (PRILOSEC) 40 MG capsule Take 40 mg by mouth 2 (two) times daily.        . valACYclovir (VALTREX) 500 MG tablet TAKE 1 TABLET BY MOUTH EVERY DAY  30 tablet  6  . aspirin EC 81 MG tablet Take 1 tablet (81 mg total) by mouth daily.         Review of Systems Per HPI    Objective:   Physical Exam  Nursing note and vitals reviewed. Constitutional: She appears well-developed and well-nourished. No distress.  HENT:  Head: Normocephalic and atraumatic.  Mouth/Throat: Oropharynx is clear and moist. No oropharyngeal exudate.       Left mouth ulcer  Eyes: Conjunctivae and EOM are normal. Pupils are equal, round, and reactive to light. No scleral icterus.  Neck: Normal range of motion. Neck supple.   Cardiovascular: Normal rate, regular rhythm, normal heart sounds and intact distal pulses.   No murmur heard. Pulmonary/Chest: Effort normal and breath sounds normal. No respiratory distress. She has no wheezes. She has no rales.  Abdominal: She exhibits distension. She exhibits no mass. Bowel sounds are decreased. There is no hepatosplenomegaly. There is tenderness (moderate)  in the left lower quadrant. There is guarding. There is no rigidity, no rebound, no CVA tenderness and negative Murphy's sign.  Musculoskeletal: She exhibits no edema.  Lymphadenopathy:    She has no cervical adenopathy.  Skin: Skin is warm and dry. No rash noted.  Psychiatric: She has a normal mood and affect.       Assessment & Plan:

## 2012-01-13 NOTE — Patient Instructions (Addendum)
Xray today. Blood work today. We will send you to ER for further evaluation of possible early bowel obstruction. Let us know how you are doing.   Once feeling better, I recommend increasing amitriptyline to 29m daily

## 2012-01-13 NOTE — Telephone Encounter (Signed)
Caller: Sindee/Patient; PCP: Ria Bush; CB#: [phone]; ; ; Call regarding Stomach Cramping, Nausea and Constipated.; states has Crohns disease and believes she is having a flare.  States has appt 1015 for B12 shot, but wants appt with Dr. Danise Mina.  Denies bleeding, severe pain, or other emergent symptoms per protocol; appt changed to 1115 01/13/12 with Dr. Danise Mina; B12 shot appt cancelled in system, as patient may receive her injection at the visit.

## 2012-01-13 NOTE — Telephone Encounter (Signed)
Will see then.

## 2012-01-13 NOTE — ED Notes (Signed)
Pt complains of pain in abd, and bloating and distention, sent from md office for ? Possible blockage and crohns

## 2012-01-14 ENCOUNTER — Encounter (HOSPITAL_COMMUNITY): Payer: Self-pay | Admitting: Gastroenterology

## 2012-01-14 LAB — BASIC METABOLIC PANEL
CO2: 24 mEq/L (ref 19–32)
Chloride: 103 mEq/L (ref 96–112)
Creatinine, Ser: 0.72 mg/dL (ref 0.50–1.10)
GFR calc Af Amer: >90 mL/min (ref >90)
Potassium: 4.3 mEq/L (ref 3.5–5.1)
Sodium: 138 mEq/L (ref 135–145)

## 2012-01-14 LAB — CBC
HCT: 37.6 % (ref 36.0–46.0)
Hemoglobin: 12.6 g/dL (ref 12.0–15.0)
RBC: 3.92 MIL/uL (ref 3.87–5.11)
RDW: 13 % (ref 11.5–15.5)
WBC: 6.4 10*3/uL (ref 4.0–10.5)

## 2012-01-14 MED ORDER — AMITRIPTYLINE HCL 25 MG PO TABS
12.5000 mg | ORAL_TABLET | Freq: Every day | ORAL | Status: DC
  Administered 2012-01-14 – 2012-01-15 (×2): 12.5 mg via ORAL
  Filled 2012-01-14 (×3): qty 0.5

## 2012-01-14 MED ORDER — LORAZEPAM 1 MG PO TABS
1.0000 mg | ORAL_TABLET | Freq: Three times a day (TID) | ORAL | Status: DC | PRN
  Administered 2012-01-14 – 2012-01-16 (×4): 1 mg via ORAL
  Filled 2012-01-14 (×4): qty 1

## 2012-01-14 MED ORDER — VALACYCLOVIR HCL 500 MG PO TABS
500.0000 mg | ORAL_TABLET | Freq: Every day | ORAL | Status: DC
  Administered 2012-01-14 – 2012-01-16 (×3): 500 mg via ORAL
  Filled 2012-01-14 (×3): qty 1

## 2012-01-14 MED ORDER — CITALOPRAM HYDROBROMIDE 40 MG PO TABS
40.0000 mg | ORAL_TABLET | Freq: Every day | ORAL | Status: DC
  Administered 2012-01-14 – 2012-01-16 (×3): 40 mg via ORAL
  Filled 2012-01-14 (×3): qty 1

## 2012-01-14 MED ORDER — METOPROLOL SUCCINATE ER 25 MG PO TB24
25.0000 mg | ORAL_TABLET | Freq: Every day | ORAL | Status: DC
  Administered 2012-01-14 – 2012-01-16 (×3): 25 mg via ORAL
  Filled 2012-01-14 (×3): qty 1

## 2012-01-14 MED ORDER — CYANOCOBALAMIN 1000 MCG/ML IJ SOLN: 1000.0000 ug | INTRAMUSCULAR | Status: DC

## 2012-01-14 MED ORDER — PANTOPRAZOLE SODIUM 40 MG PO TBEC
40.0000 mg | DELAYED_RELEASE_TABLET | Freq: Every day | ORAL | Status: DC
  Administered 2012-01-14 – 2012-01-16 (×3): 40 mg via ORAL
  Filled 2012-01-14 (×3): qty 1

## 2012-01-14 MED ORDER — ASPIRIN EC 81 MG PO TBEC
81.0000 mg | DELAYED_RELEASE_TABLET | Freq: Every day | ORAL | Status: DC
  Administered 2012-01-14 – 2012-01-16 (×3): 81 mg via ORAL
  Filled 2012-01-14 (×3): qty 1

## 2012-01-14 NOTE — Consult Note (Signed)
Reason for Consult: History of Crohn's disease Referring Physician: Hospital team  Sharon [name] is an 56 y.o. female.  HPI: Patient well known to my partner with long-standing Crohn's disease who missed a few appointments lately due to finances which we discussed and she was started on simzia  in December which has helped a little and was on 6-MP in the past but she does not know the dose but that was stopped when she started this new medicine and she had been on Pentasa in the past as well but doesn't remember why that was stopped and was recently put on Entocort because she refuses to take prednisone although I am not sure if she's ever been on prednisolone which might be better tolerated and although she did not have significant side effects on Entocort she's not sure if it help and recently went to her primary who repeated the CT scan and based on some increased abdominal pain she was admitted for further workup and plans both her hospital chart and her office chart were reviewed and I had a long talk with the patient about the options Past Medical History  Diagnosis Date  . Elevated blood pressure   . Migraines     and frequent other headaches (sinus or stress)  . Crohn's disease     h/o stenotic crohn's, active in TI, started [place], PPD neg ([facility] Dr. [name])  . Cervical cancer 12/2008    s/p hysterectomy  . History of chicken pox   . Anxiety and depression   . History of anemia     attributed to crohn's  . Arthritis   . Genital warts   . History of syphilis 1980s  . Smoker   . TIA (transient ischemic attack) 05/2010    TIA vs complex migraine (w/u negative - carotids, echo, TC doppler, and MRI normal)  . Pancytopenia     mild, found in hospital  . Hepatic steatosis 04/2011    diffuse on CT, 29m gallbladder polyp  . Vitamin B12 deficiency     Past Surgical History  Procedure Date  . Appendectomy 1998  . Right colectomy 2000    crohn's disease, ileocecal resection  .  Hospitalization 05/2010    TIA vs complex migraine - w/u negative - head CT, MRI/MRA, carotid dopplers, echo.  treated with ASA and tramadol  . [place] 05/2010    nl LV fxn ,EF 60%  . Ct abd wo & pelvis w cm 02/2011    nothing acute, chronic terminal ileitis  . [place]    diffuse hepatic steatosis, 545mpolyp in gallbaldder fundus, no stones, mod abd ath  . Colonoscopy 10/2008    ileo-colonic anastomosis, ielocolonic crohn's  . Colonoscopy 05/2011    ileo-colonic anastomosis normal, normal mucosa  . Esophagogastroduodenoscopy 05/2011    nl esophagus, gastritis, nl duodenum  . Cervical biopsy  w/ loop electrode excision 10/2008  . Vaginal hysterectomy 5/`13/2010    LAVH/BSO    Family History  Problem Relation Age of Onset  . Crohn's disease Daughter     crohn's, ulcerative colitis  . Ulcerative colitis Daughter   . Stroke Mother   . Hypertension Mother   . Hyperlipidemia Mother   . Hypertension Father   . Crohn's disease Father   . Crohn's disease Sister   . Cancer Sister     ovarian  . Crohn's disease Paternal Aunt   . Crohn's disease Paternal Uncle   . Diabetes Neg Hx  Social History:  reports that she has been smoking Cigarettes.  She has a 5 pack-year smoking history. She has never used smokeless tobacco. She reports that she does not drink alcohol or use illicit drugs.  Allergies:  Allergies  Allergen Reactions  . Codeine Nausea And Vomiting  . Remeron (Mirtazapine)     Knocked out    Medications: I have reviewed the patient's current medications.  Results for orders placed during the hospital encounter of 01/13/12 (from the past 48 hour(s))  CBC     Status: Normal   Collection Time   01/13/12  3:11 PM      Component Value Range Comment   WBC 8.9  4.0 - 10.5 (K/uL)    RBC 4.27  3.87 - 5.11 (MIL/uL)    Hemoglobin 13.8  12.0 - 15.0 (g/dL)    HCT 40.6  36.0 - 46.0 (%)    MCV 95.1  78.0 - 100.0 (fL)    MCH 32.3  26.0 - 34.0 (pg)    MCHC 34.0   30.0 - 36.0 (g/dL)    RDW 12.9  11.5 - 15.5 (%)    Platelets 203  150 - 400 (K/uL)   DIFFERENTIAL     Status: Normal   Collection Time   01/13/12  3:11 PM      Component Value Range Comment   Neutrophils Relative 66  43 - 77 (%)    Neutro Abs 5.9  1.7 - 7.7 (K/uL)    Lymphocytes Relative 26  12 - 46 (%)    Lymphs Abs 2.3  0.7 - 4.0 (K/uL)    Monocytes Relative 8  3 - 12 (%)    Monocytes Absolute 0.7  0.1 - 1.0 (K/uL)    Eosinophils Relative 0  0 - 5 (%)    Eosinophils Absolute 0.0  0.0 - 0.7 (K/uL)    Basophils Relative 0  0 - 1 (%)    Basophils Absolute 0.0  0.0 - 0.1 (K/uL)   COMPREHENSIVE METABOLIC PANEL     Status: Abnormal   Collection Time   01/13/12  3:11 PM      Component Value Range Comment   Sodium 140  135 - 145 (mEq/L)    Potassium 4.3  3.5 - 5.1 (mEq/L)    Chloride 102  96 - 112 (mEq/L)    CO2 28  19 - 32 (mEq/L)    Glucose, Bld 89  70 - 99 (mg/dL)    BUN 9  6 - 23 (mg/dL)    Creatinine, Ser 0.80  0.50 - 1.10 (mg/dL)    Calcium 9.1  8.4 - 10.5 (mg/dL)    Total Protein 7.2  6.0 - 8.3 (g/dL)    Albumin 3.6  3.5 - 5.2 (g/dL)    AST 24  0 - 37 (U/L)    ALT 21  0 - 35 (U/L)    Alkaline Phosphatase 97  39 - 117 (U/L)    Total Bilirubin 0.6  0.3 - 1.2 (mg/dL)    GFR calc non Af Amer 82 (*) >90 (mL/min)    GFR calc Af Amer >90  >90 (mL/min)   OCCULT BLOOD, POC DEVICE     Status: Normal   Collection Time   01/13/12  3:19 PM      Component Value Range Comment   Fecal Occult Bld NEGATIVE     URINALYSIS, ROUTINE W REFLEX MICROSCOPIC     Status: Abnormal   Collection Time   01/13/12  5:05 PM  Component Value Range Comment   Color, Urine YELLOW  YELLOW     APPearance HAZY (*) CLEAR     Specific Gravity, Urine 1.006  1.005 - 1.030     pH 6.0  5.0 - 8.0     Glucose, UA NEGATIVE  NEGATIVE (mg/dL)    Hgb urine dipstick TRACE (*) NEGATIVE     Bilirubin Urine NEGATIVE  NEGATIVE     Ketones, ur NEGATIVE  NEGATIVE (mg/dL)    Protein, ur NEGATIVE  NEGATIVE (mg/dL)     Urobilinogen, UA 0.2  0.0 - 1.0 (mg/dL)    Nitrite NEGATIVE  NEGATIVE     Leukocytes, UA LARGE (*) NEGATIVE    URINE MICROSCOPIC-ADD ON     Status: Abnormal   Collection Time   01/13/12  5:05 PM      Component Value Range Comment   Squamous Epithelial / LPF MANY (*) RARE     WBC, UA 21-50  <3 (WBC/hpf)    Bacteria, UA FEW (*) RARE    BASIC METABOLIC PANEL     Status: Abnormal   Collection Time   01/14/12  5:00 AM      Component Value Range Comment   Sodium 138  135 - 145 (mEq/L)    Potassium 4.3  3.5 - 5.1 (mEq/L)    Chloride 103  96 - 112 (mEq/L)    CO2 24  19 - 32 (mEq/L)    Glucose, Bld 158 (*) 70 - 99 (mg/dL)    BUN 8  6 - 23 (mg/dL)    Creatinine, Ser 0.72  0.50 - 1.10 (mg/dL)    Calcium 8.9  8.4 - 10.5 (mg/dL)    GFR calc non Af Amer >90  >90 (mL/min)    GFR calc Af Amer >90  >90 (mL/min)   CBC     Status: Normal   Collection Time   01/14/12  5:00 AM      Component Value Range Comment   WBC 6.4  4.0 - 10.5 (K/uL)    RBC 3.92  3.87 - 5.11 (MIL/uL)    Hemoglobin 12.6  12.0 - 15.0 (g/dL)    HCT 37.6  36.0 - 46.0 (%)    MCV 95.9  78.0 - 100.0 (fL)    MCH 32.1  26.0 - 34.0 (pg)    MCHC 33.5  30.0 - 36.0 (g/dL)    RDW 13.0  11.5 - 15.5 (%)    Platelets 181  150 - 400 (K/uL)     Ct Abdomen Pelvis W Contrast  01/13/2012  *RADIOLOGY REPORT*  Clinical Data: Abdominal pain.  History of Crohn disease.  Status post appendectomy and bowel resections.  History of hysterectomy.  CT ABDOMEN AND PELVIS WITH CONTRAST  Technique:  Multidetector CT imaging of the abdomen and pelvis was performed following the standard protocol during bolus administration of intravenous contrast.  Contrast: 63m OMNIPAQUE IOHEXOL 300 MG/ML  SOLN  Comparison: CT of abdomen and pelvis 11/15/2011.  Findings:  Lung Bases: Minimal dependent atelectasis in the lower lobes of the lungs bilaterally.  Otherwise, unremarkable.  Abdomen/Pelvis:  The enhanced appearance of the liver, gallbladder, pancreas, spleen, bilateral  adrenal glands and bilateral kidneys is unremarkable.  There are numerous mildly dilated loops of small bowel, predominantly in the region of the distal jejunum and distal ileum, measuring up to 3.4 cm in diameter.  These are filled with oral contrast and have air fluid levels present.  The distal ileum appears markedly thickened and there are some  surrounding inflammatory changes, likely from an acute exacerbation of the patient's known Crohn disease.  Postoperative changes of appendectomy are noted.  There is a small volume of free fluid within the cul-de-sac, presumably reactive to inflammation.  No larger volume of ascites is noted.  No pneumoperitoneum. Atherosclerotic calcifications are noted throughout the abdominal and pelvic vasculature, without evidence of aneurysm.  The patient is status post hysterectomy.  The ovaries are not well visualized and may either be atrophic or may be surgically removed.  Urinary bladder is unremarkable in appearance.  Musculoskeletal: There are no aggressive appearing lytic or blastic lesions noted in the visualized portions of the skeleton.  IMPRESSION: 1.  Findings, as above, compatible with acute exacerbation of the patient's known Crohn disease with extensive thickening and inflammation of the distal ileum.  At this time, there is mild dilatation of portions of the distal jejunum and ileum, suggesting some partial obstruction related to the inflammatory changes in the distal ileum. 2.  Small volume of free fluid the pelvis is presumably reactive. 3.  Atherosclerosis. 4.  Status post hysterectomy and appendectomy.  Original Report Authenticated By: Etheleen Mayhew, M.D.   Dg Abd Acute W/chest  01/13/2012  *RADIOLOGY REPORT*  Clinical Data: Abdominal pain, concern for obstruction  ACUTE ABDOMEN SERIES (ABDOMEN 2 VIEW & CHEST 1 VIEW)  Comparison: 06/05/2011  Findings: Cardiomediastinal silhouette is stable.  No acute infiltrate or pleural effusion.  No pulmonary edema.   Mild distended small bowel loops mid abdomen with air-fluid level suspicious for early bowel obstruction or ileus.  No free abdominal air.  IMPRESSION: Mild distended small bowel loops mid abdomen with air-fluid level suspicious for early bowel obstruction or ileus.  Original Report Authenticated By: Lahoma Crocker, M.D.    ROS able to tolerate clear liquids without nausea or vomiting and passing gas today with a little loose stool but no bleeding but multiple family members have Crohn's disease not mentioned above Blood pressure 123/78, pulse 80, temperature 98 F (36.7 C), temperature source Oral, resp. rate 18, height 5' 1"  (1.549 m), weight 69.6 kg (153 lb 7 oz), SpO2 93.00%. Physical Exam vital signs stable afebrile no acute distress although anxious and opinionated abdomen is soft occasional bowel sounds no guarding or rebound slight tenderness throughout labs reviewed  Assessment/Plan: Long-standing Crohn's disease and multiple other problems Plan: Continue doing what you are doing and probably re\re discharge on Entocort although he should try to convince her to try prednisolone just to see if she can avoid some of the prednisone side effects and she'll need to followup with my partner Dr. [name] within one week of discharge which we discussed and you can call me this weekend if any further question or problem or call Dr. [name] in the office next week if you need any further assistance and I would plan to restart her 6-MP as well and possibly even Pentasa and she may want a second opinion and we discussed [facility] where she's been before or even [facility] and I feel like both of those universities have better inflammatory bowel disease departments then [facility] and she might even want to seek a different gastroenterologist in town [facility] E 01/14/2012, 1:28 PM

## 2012-01-14 NOTE — Progress Notes (Addendum)
Subjective: Patient seen and examined, reported improvement in her abdominal pain however stated that she hadn't had a bowel movement for 3 days. She denies any nausea or vomiting.  Objective: Vital signs in last 24 hours: Temp:  [97.4 F (36.3 C)-98.6 F (37 C)] 98 F (36.7 C) (05/18 1004) Pulse Rate:  [70-88] 80  (05/18 1004) Resp:  [18-20] 18  (05/18 1004) BP: (101-125)/(54-80) 123/78 mmHg (05/18 1004) SpO2:  [91 %-98 %] 93 % (05/18 1004) FiO2 (%):  [21 %] 21 % (05/17 2130) Weight:  [69.599 kg (153 lb 7 oz)-69.6 kg (153 lb 7 oz)] 69.6 kg (153 lb 7 oz) (05/17 2100) Weight change:     Intake/Output from previous day: 05/17 0701 - 05/18 0700 In: 901.7 [P.O.:200; I.V.:701.7] Out: -  Total I/O In: 240 [P.O.:240] Out: -    Physical Exam: General: Alert, awake, oriented x3, in no acute distress. Heart: Regular rate and rhythm, without murmurs, rubs, gallops. Lungs: Clear to auscultation bilaterally. Abdomen: Distended, Soft, right lower quadrant atenderness on deep palpation , sluggish bowel sounds. Extremities: No clubbing cyanosis or edema with positive pedal pulses. Neuro: Grossly intact, nonfocal.    Lab Results: Results for orders placed during the hospital encounter of 01/13/12 (from the past 24 hour(s))  CBC     Status: Normal   Collection Time   01/13/12  3:11 PM      Component Value Range   WBC 8.9  4.0 - 10.5 (K/uL)   RBC 4.27  3.87 - 5.11 (MIL/uL)   Hemoglobin 13.8  12.0 - 15.0 (g/dL)   HCT 40.6  36.0 - 46.0 (%)   MCV 95.1  78.0 - 100.0 (fL)   MCH 32.3  26.0 - 34.0 (pg)   MCHC 34.0  30.0 - 36.0 (g/dL)   RDW 12.9  11.5 - 15.5 (%)   Platelets 203  150 - 400 (K/uL)  DIFFERENTIAL     Status: Normal   Collection Time   01/13/12  3:11 PM      Component Value Range   Neutrophils Relative 66  43 - 77 (%)   Neutro Abs 5.9  1.7 - 7.7 (K/uL)   Lymphocytes Relative 26  12 - 46 (%)   Lymphs Abs 2.3  0.7 - 4.0 (K/uL)   Monocytes Relative 8  3 - 12 (%)   Monocytes  Absolute 0.7  0.1 - 1.0 (K/uL)   Eosinophils Relative 0  0 - 5 (%)   Eosinophils Absolute 0.0  0.0 - 0.7 (K/uL)   Basophils Relative 0  0 - 1 (%)   Basophils Absolute 0.0  0.0 - 0.1 (K/uL)  COMPREHENSIVE METABOLIC PANEL     Status: Abnormal   Collection Time   01/13/12  3:11 PM      Component Value Range   Sodium 140  135 - 145 (mEq/L)   Potassium 4.3  3.5 - 5.1 (mEq/L)   Chloride 102  96 - 112 (mEq/L)   CO2 28  19 - 32 (mEq/L)   Glucose, Bld 89  70 - 99 (mg/dL)   BUN 9  6 - 23 (mg/dL)   Creatinine, Ser 0.80  0.50 - 1.10 (mg/dL)   Calcium 9.1  8.4 - 10.5 (mg/dL)   Total Protein 7.2  6.0 - 8.3 (g/dL)   Albumin 3.6  3.5 - 5.2 (g/dL)   AST 24  0 - 37 (U/L)   ALT 21  0 - 35 (U/L)   Alkaline Phosphatase 97  39 - 117 (U/L)  Total Bilirubin 0.6  0.3 - 1.2 (mg/dL)   GFR calc non Af Amer 82 (*) >90 (mL/min)   GFR calc Af Amer >90  >90 (mL/min)  OCCULT BLOOD, POC DEVICE     Status: Normal   Collection Time   01/13/12  3:19 PM      Component Value Range   Fecal Occult Bld NEGATIVE    URINALYSIS, ROUTINE W REFLEX MICROSCOPIC     Status: Abnormal   Collection Time   01/13/12  5:05 PM      Component Value Range   Color, Urine YELLOW  YELLOW    APPearance HAZY (*) CLEAR    Specific Gravity, Urine 1.006  1.005 - 1.030    pH 6.0  5.0 - 8.0    Glucose, UA NEGATIVE  NEGATIVE (mg/dL)   Hgb urine dipstick TRACE (*) NEGATIVE    Bilirubin Urine NEGATIVE  NEGATIVE    Ketones, ur NEGATIVE  NEGATIVE (mg/dL)   Protein, ur NEGATIVE  NEGATIVE (mg/dL)   Urobilinogen, UA 0.2  0.0 - 1.0 (mg/dL)   Nitrite NEGATIVE  NEGATIVE    Leukocytes, UA LARGE (*) NEGATIVE   URINE MICROSCOPIC-ADD ON     Status: Abnormal   Collection Time   01/13/12  5:05 PM      Component Value Range   Squamous Epithelial / LPF MANY (*) RARE    WBC, UA 21-50  <3 (WBC/hpf)   Bacteria, UA FEW (*) RARE   BASIC METABOLIC PANEL     Status: Abnormal   Collection Time   01/14/12  5:00 AM      Component Value Range   Sodium 138  135  - 145 (mEq/L)   Potassium 4.3  3.5 - 5.1 (mEq/L)   Chloride 103  96 - 112 (mEq/L)   CO2 24  19 - 32 (mEq/L)   Glucose, Bld 158 (*) 70 - 99 (mg/dL)   BUN 8  6 - 23 (mg/dL)   Creatinine, Ser 0.72  0.50 - 1.10 (mg/dL)   Calcium 8.9  8.4 - 10.5 (mg/dL)   GFR calc non Af Amer >90  >90 (mL/min)   GFR calc Af Amer >90  >90 (mL/min)  CBC     Status: Normal   Collection Time   01/14/12  5:00 AM      Component Value Range   WBC 6.4  4.0 - 10.5 (K/uL)   RBC 3.92  3.87 - 5.11 (MIL/uL)   Hemoglobin 12.6  12.0 - 15.0 (g/dL)   HCT 37.6  36.0 - 46.0 (%)   MCV 95.9  78.0 - 100.0 (fL)   MCH 32.1  26.0 - 34.0 (pg)   MCHC 33.5  30.0 - 36.0 (g/dL)   RDW 13.0  11.5 - 15.5 (%)   Platelets 181  150 - 400 (K/uL)    Studies/Results: Ct Abdomen Pelvis W Contrast  01/13/2012  *RADIOLOGY REPORT*  Clinical Data: Abdominal pain.  History of Crohn disease.  Status post appendectomy and bowel resections.  History of hysterectomy.  CT ABDOMEN AND PELVIS WITH CONTRAST  Technique:  Multidetector CT imaging of the abdomen and pelvis was performed following the standard protocol during bolus administration of intravenous contrast.  Contrast: 23m OMNIPAQUE IOHEXOL 300 MG/ML  SOLN  Comparison: CT of abdomen and pelvis 11/15/2011.  Findings:  Lung Bases: Minimal dependent atelectasis in the lower lobes of the lungs bilaterally.  Otherwise, unremarkable.  Abdomen/Pelvis:  The enhanced appearance of the liver, gallbladder, pancreas, spleen, bilateral adrenal glands and bilateral  kidneys is unremarkable.  There are numerous mildly dilated loops of small bowel, predominantly in the region of the distal jejunum and distal ileum, measuring up to 3.4 cm in diameter.  These are filled with oral contrast and have air fluid levels present.  The distal ileum appears markedly thickened and there are some surrounding inflammatory changes, likely from an acute exacerbation of the patient's known Crohn disease.  Postoperative changes of  appendectomy are noted.  There is a small volume of free fluid within the cul-de-sac, presumably reactive to inflammation.  No larger volume of ascites is noted.  No pneumoperitoneum. Atherosclerotic calcifications are noted throughout the abdominal and pelvic vasculature, without evidence of aneurysm.  The patient is status post hysterectomy.  The ovaries are not well visualized and may either be atrophic or may be surgically removed.  Urinary bladder is unremarkable in appearance.  Musculoskeletal: There are no aggressive appearing lytic or blastic lesions noted in the visualized portions of the skeleton.  IMPRESSION: 1.  Findings, as above, compatible with acute exacerbation of the patient's known Crohn disease with extensive thickening and inflammation of the distal ileum.  At this time, there is mild dilatation of portions of the distal jejunum and ileum, suggesting some partial obstruction related to the inflammatory changes in the distal ileum. 2.  Small volume of free fluid the pelvis is presumably reactive. 3.  Atherosclerosis. 4.  Status post hysterectomy and appendectomy.  Original Report Authenticated By: Etheleen Mayhew, M.D.   Dg Abd Acute W/chest  01/13/2012  *RADIOLOGY REPORT*  Clinical Data: Abdominal pain, concern for obstruction  ACUTE ABDOMEN SERIES (ABDOMEN 2 VIEW & CHEST 1 VIEW)  Comparison: 06/05/2011  Findings: Cardiomediastinal silhouette is stable.  No acute infiltrate or pleural effusion.  No pulmonary edema.  Mild distended small bowel loops mid abdomen with air-fluid level suspicious for early bowel obstruction or ileus.  No free abdominal air.  IMPRESSION: Mild distended small bowel loops mid abdomen with air-fluid level suspicious for early bowel obstruction or ileus.  Original Report Authenticated By: Lahoma Crocker, M.D.    Medications:    . sodium chloride   Intravenous STAT  . ciprofloxacin  400 mg Intravenous Q12H  . enoxaparin  40 mg Subcutaneous Q24H  .  HYDROmorphone  (DILAUDID) injection  1 mg Intravenous Once  .  HYDROmorphone (DILAUDID) injection  1 mg Intravenous Once  . methylPREDNISolone (SOLU-MEDROL) injection  125 mg Intravenous Q12H  . metronidazole  500 mg Intravenous Q8H  . ondansetron (ZOFRAN) IV  4 mg Intravenous Once  . ondansetron  4 mg Intravenous Once  . sodium chloride  1,000 mL Intravenous Once    acetaminophen, acetaminophen, alum & mag hydroxide-simeth, HYDROmorphone (DILAUDID) injection, iohexol, ondansetron (ZOFRAN) IV, ondansetron, zolpidem     . sodium chloride    . sodium chloride 100 mL/hr at 01/13/12 2208    Assessment/Plan:  Principal Problem:  *Crohn's colitis/ Partial small bowel obstruction -Continue that with steroids IV, Flagyl and Cipro. Advance diet to clear liquids. She was on Cimzia subcutaneous injections as an outpatient  -- Dr Watt Climes with GI was consulted Active Problems: Hypertension:  Controlled, continue metoprolol.  UTI:  -Continue ciprofloxacin, follow cultures Tobacco abuse:  -counseled on cessation History of depression:  -Stable, Continue meds  DVT prophylaxis:  -Subcutaneous Lovenox      LOS: 1 day   Malaki Koury 01/14/2012, 10:05 AM

## 2012-01-15 ENCOUNTER — Inpatient Hospital Stay (HOSPITAL_COMMUNITY): Payer: BC Managed Care – PPO

## 2012-01-15 LAB — URINE CULTURE
Colony Count: >=100000
Culture  Setup Time: 201305180159

## 2012-01-15 MED ORDER — BUDESONIDE 3 MG PO CP24
9.0000 mg | ORAL_CAPSULE | Freq: Every day | ORAL | Status: DC
  Administered 2012-01-15 – 2012-01-16 (×2): 9 mg via ORAL
  Filled 2012-01-15 (×2): qty 3

## 2012-01-15 NOTE — ED Provider Notes (Addendum)
  Cc: Abdominal pain  History of present illness: Sharon [name] is a 56 y.o. female , who has had abdominal pain, with bloating, and distention for several days. She saw her primary care doctor today, and was sent here for further evaluation. She denies fever, chills, cough, chest pain, back pain, weakness, or dizziness. She's been unable to tolerate her usual medications at home.    past medical history : Reviewed and agree with nursing notes. Pertinent positive is Crohn's disease   past surgical history : Reviewed and agree with nursing notes . Pertinent positive is colectomy    medications : Reviewed and agree with nursing notes. The patient has not tolerated her medications    Allergies: : Noted. Nursing notes.    Social history:: Daily. Smoker, no alcohol, or current drug use.  Physical examination: Admission vital signs are normal.  HEENT: Normocephalic and atraumatic. Pupils equal, round, and to light and accommodation. External ears are normal. Oropharynx is moist without oral lesions. Neck: Supple. Normal range of motion. Heart regular rate and rhythm. No murmur. Lungs clear to auscultation bilaterally. No wheezes, rales, or rhonchi. Abdomen normal bowel sounds, soft. Mild distention. Mild, diffuse tenderness. No palpable mass or organomegaly. No appreciable hernia. Musculoskeletal normal range of motion of arms, and legs. No focal localized tenderness of the back. Lymph exam: No localized adenopathy of the neck or groin.  Neurologic: Alert and oriented x3. Normal strength and sensation in the arms, and legs bilaterally. Cranial nerve 12 are intact. Psychiatric: Normal behavior and affect. Normal. Thought content. Constitutional: Patient is alert, cooperative, nontoxic in appearance.   Patient placed in CDU for testing. Concern is for small bowel obstruction.   Medical screening examination/treatment/procedure(s) were conducted as a shared visit with non-physician  practitioner(s) and myself.  I personally evaluated the patient during the encounter  Richarda Blade, MD 01/15/12 [place], MD 02/27/12 [phone]

## 2012-01-15 NOTE — Progress Notes (Addendum)
Subjective: Patient seen and examined, feeling much better today, denies any vomiting or abdominal pain. She had a bowel movement yesterday  Objective: Vital signs in last 24 hours: Temp:  [97.8 F (36.6 C)-98.6 F (37 C)] 97.8 F (36.6 C) (05/19 0953) Pulse Rate:  [70-88] 70  (05/19 0953) Resp:  [18] 18  (05/19 0953) BP: (99-139)/(50-76) 137/72 mmHg (05/19 0953) SpO2:  [93 %-96 %] 95 % (05/19 0953) Weight change:     Intake/Output from previous day: 05/18 0701 - 05/19 0700 In: 420 [P.O.:420] Out: -  Total I/O In: 240 [P.O.:240] Out: -    Physical Exam: General: Alert, awake, oriented x3, in no acute distress. Heart: Regular rate and rhythm, without murmurs, rubs, gallops. Lungs: Clear to auscultation bilaterally. Abdomen: Soft, mild diffuse tender, nondistended, positive bowel sounds. Extremities: No clubbing cyanosis or edema with positive pedal pulses. Neuro: Grossly intact, nonfocal.    Lab Results: No results found for this or any previous visit (from the past 24 hour(s)).  Studies/Results: Ct Abdomen Pelvis W Contrast  01/13/2012  *RADIOLOGY REPORT*  Clinical Data: Abdominal pain.  History of Crohn disease.  Status post appendectomy and bowel resections.  History of hysterectomy.  CT ABDOMEN AND PELVIS WITH CONTRAST  Technique:  Multidetector CT imaging of the abdomen and pelvis was performed following the standard protocol during bolus administration of intravenous contrast.  Contrast: 15m OMNIPAQUE IOHEXOL 300 MG/ML  SOLN  Comparison: CT of abdomen and pelvis 11/15/2011.  Findings:  Lung Bases: Minimal dependent atelectasis in the lower lobes of the lungs bilaterally.  Otherwise, unremarkable.  Abdomen/Pelvis:  The enhanced appearance of the liver, gallbladder, pancreas, spleen, bilateral adrenal glands and bilateral kidneys is unremarkable.  There are numerous mildly dilated loops of small bowel, predominantly in the region of the distal jejunum and distal ileum,  measuring up to 3.4 cm in diameter.  These are filled with oral contrast and have air fluid levels present.  The distal ileum appears markedly thickened and there are some surrounding inflammatory changes, likely from an acute exacerbation of the patient's known Crohn disease.  Postoperative changes of appendectomy are noted.  There is a small volume of free fluid within the cul-de-sac, presumably reactive to inflammation.  No larger volume of ascites is noted.  No pneumoperitoneum. Atherosclerotic calcifications are noted throughout the abdominal and pelvic vasculature, without evidence of aneurysm.  The patient is status post hysterectomy.  The ovaries are not well visualized and may either be atrophic or may be surgically removed.  Urinary bladder is unremarkable in appearance.  Musculoskeletal: There are no aggressive appearing lytic or blastic lesions noted in the visualized portions of the skeleton.  IMPRESSION: 1.  Findings, as above, compatible with acute exacerbation of the patient's known Crohn disease with extensive thickening and inflammation of the distal ileum.  At this time, there is mild dilatation of portions of the distal jejunum and ileum, suggesting some partial obstruction related to the inflammatory changes in the distal ileum. 2.  Small volume of free fluid the pelvis is presumably reactive. 3.  Atherosclerosis. 4.  Status post hysterectomy and appendectomy.  Original Report Authenticated By: DEtheleen Mayhew M.D.   Dg Abd Acute W/chest  01/13/2012  *RADIOLOGY REPORT*  Clinical Data: Abdominal pain, concern for obstruction  ACUTE ABDOMEN SERIES (ABDOMEN 2 VIEW & CHEST 1 VIEW)  Comparison: 06/05/2011  Findings: Cardiomediastinal silhouette is stable.  No acute infiltrate or pleural effusion.  No pulmonary edema.  Mild distended small bowel loops mid abdomen  with air-fluid level suspicious for early bowel obstruction or ileus.  No free abdominal air.  IMPRESSION: Mild distended small bowel  loops mid abdomen with air-fluid level suspicious for early bowel obstruction or ileus.  Original Report Authenticated By: Lahoma Crocker, M.D.    Medications:    . amitriptyline  12.5 mg Oral QHS  . aspirin EC  81 mg Oral Daily  . ciprofloxacin  400 mg Intravenous Q12H  . citalopram  40 mg Oral Daily  . enoxaparin  40 mg Subcutaneous Q24H  . methylPREDNISolone (SOLU-MEDROL) injection  125 mg Intravenous Q12H  . metoprolol succinate  25 mg Oral Daily  . metronidazole  500 mg Intravenous Q8H  . pantoprazole  40 mg Oral Q1200  . valACYclovir  500 mg Oral Daily  . DISCONTD: cyanocobalamin  1,000 mcg Intramuscular Q30 days    acetaminophen, acetaminophen, alum & mag hydroxide-simeth, HYDROmorphone (DILAUDID) injection, LORazepam, ondansetron (ZOFRAN) IV, ondansetron, zolpidem     . sodium chloride    . sodium chloride 100 mL/hr at 01/13/12 2208    Assessment/Plan:  Principal Problem:  *Crohn's colitis/ Partial small bowel obstruction  -Improving, will switch IV steroids to  Entocort, questionable if she will need to be on Cipro and Flagyl will discuss with GI. We'll advance diet to soft diet. Repeat KUB abdomen today.appreciated Dr Perley Jain input. Active Problems:  Hypertension:  Controlled, continue metoprolol.  UTI:  -Continue ciprofloxacin, follow cultures  Tobacco abuse:  -counseled on cessation  History of depression:  -Stable, Continue meds  DVT prophylaxis:  -Subcutaneous Lovenox     LOS: 2 days   Sharon [name] 01/15/2012, 10:10 AM

## 2012-01-16 DIAGNOSIS — F17200 Nicotine dependence, unspecified, uncomplicated: Secondary | ICD-10-CM

## 2012-01-16 DIAGNOSIS — R112 Nausea with vomiting, unspecified: Secondary | ICD-10-CM

## 2012-01-16 DIAGNOSIS — K5010 Crohn's disease of large intestine without complications: Secondary | ICD-10-CM

## 2012-01-16 DIAGNOSIS — R1031 Right lower quadrant pain: Secondary | ICD-10-CM

## 2012-01-16 MED ORDER — BUDESONIDE 3 MG PO CP24: 9.0000 mg | ORAL_CAPSULE | Freq: Every day | ORAL | Status: DC

## 2012-01-16 MED ORDER — HYDROMORPHONE HCL 2 MG PO TABS: 2.0000 mg | ORAL_TABLET | Freq: Four times a day (QID) | ORAL | Status: AC | PRN

## 2012-01-16 MED ORDER — LEVOFLOXACIN 750 MG PO TABS: 750.0000 mg | ORAL_TABLET | Freq: Every day | ORAL | Status: DC

## 2012-01-16 MED ORDER — ZOLPIDEM TARTRATE 5 MG PO TABS: 5.0000 mg | ORAL_TABLET | Freq: Every evening | ORAL | Status: DC | PRN

## 2012-01-16 MED ORDER — METRONIDAZOLE 500 MG PO TABS: 500.0000 mg | ORAL_TABLET | Freq: Three times a day (TID) | ORAL | Status: DC

## 2012-01-16 NOTE — Care Management Note (Signed)
    Page 1 of 1   01/16/2012     11:19:05 AM   CARE MANAGEMENT NOTE 01/16/2012  Patient:  Sharon [name], Sharon [name]   Account Number:  [id number]  Date Initiated:  01/16/2012  Documentation initiated by:  Tomi Bamberger  Subjective/Objective Assessment:   dx crohn;s colitis  admit- lives with son.     Action/Plan:   Anticipated DC Date:  01/16/2012   Anticipated DC Plan:  [place]  CM consult      Choice offered to / List presented to:             Status of service:  Completed, signed off Medicare Important Message given?   (If response is "NO", the following Medicare IM given date fields will be blank) Date Medicare IM given:   Date Additional Medicare IM given:    Discharge Disposition:  HOME/SELF CARE  Per UR Regulation:  Reviewed for med. necessity/level of care/duration of stay  If discussed at [place] of Stay Meetings, dates discussed:    Comments:  01/16/12 11:14 Tomi Bamberger RN,BSN [phone] patient lives with son, pta independent.  Patient has medication coverage and transportation.  No needs identified.

## 2012-01-16 NOTE — Discharge Summary (Addendum)
Patient ID: Sharon [name] MRN: [id number] DOB/AGE: 1956/08/22 56 y.o.  Admit date: 01/13/2012 Discharge date: 01/16/2012  Primary Care Physician:  Ria Bush, MD, MD   Discharge Diagnoses:    Present on Admission:  .Crohn's colitis .Abdominal pain, acute, right lower quadrant .Pneumonia .UTI Tobacco abuse   Medication List  As of 01/16/2012  9:56 AM   TAKE these medications         acetaminophen 500 MG tablet   Commonly known as: TYLENOL   Take 1,000 mg by mouth every 6 (six) hours as needed. For pain      amitriptyline 25 MG tablet   Commonly known as: ELAVIL   Take 12.5 mg by mouth at bedtime.      aspirin EC 81 MG tablet   Take 1 tablet (81 mg total) by mouth daily.      budesonide 3 MG 24 hr capsule   Commonly known as: ENTOCORT EC   Take 3 capsules (9 mg total) by mouth daily.      CIMZIA [place]   Inject 200 mg into the skin every 30 (thirty) days. Takes every 4th Saturday.  Next dose due 01/21/2012.      citalopram 40 MG tablet   Commonly known as: CELEXA   Take 40 mg by mouth daily.      cyanocobalamin 1000 MCG/ML injection   Commonly known as: (VITAMIN B-12)   Inject 1,000 mcg into the muscle every 30 (thirty) days. Last dose 01/13/2012.      HYDROmorphone 2 MG tablet   Commonly known as: DILAUDID   Take 1 tablet (2 mg total) by mouth every 6 (six) hours as needed for pain.      levofloxacin 750 MG tablet   Commonly known as: LEVAQUIN   Take 1 tablet (750 mg total) by mouth daily.      LORazepam 1 MG tablet   Commonly known as: ATIVAN   Take 1 mg by mouth 3 (three) times daily as needed. For anxiety.      metoprolol succinate 25 MG 24 hr tablet   Commonly known as: TOPROL-XL   Take 25 mg by mouth daily.      metroNIDAZOLE 500 MG tablet   Commonly known as: FLAGYL   Take 1 tablet (500 mg total) by mouth 3 (three) times daily.      omeprazole 40 MG capsule   Commonly known as: PRILOSEC   Take 40 mg by mouth 2 (two) times daily.     valACYclovir 500 MG tablet   Commonly known as: VALTREX   TAKE 1 TABLET BY MOUTH EVERY DAY            Ambien 5 mg tablet                  Take 1 tablet by mouth each bedtime when necessary for sleep      Consults:  Dr Watt Climes Fabienne Bruns     Significant Diagnostic Studies:  Dg Abd 1 View  01/15/2012  *RADIOLOGY REPORT*  Clinical Data: Follow up partial small bowel obstruction  ABDOMEN - 1 VIEW  Comparison: 01/13/2012  Findings: Oral contrast has entered the colon and rectum.  Negative for bowel obstruction.  Small bowel is not dilated.  No air-fluid levels.  Surgical bowel staples are present in the region of the cecum.    There is right lower lobe consolidation.  IMPRESSION: Contrast has entered the colon and rectum.  Negative for bowel obstruction.  Probable mild ileus.   Marland Kitchen  Original Report Authenticated By: Truett Perna, M.D.   Ct Abdomen Pelvis W Contrast  01/13/2012  *RADIOLOGY REPORT*  Clinical Data: Abdominal pain.  History of Crohn disease.  Status post appendectomy and bowel resections.  History of hysterectomy.  CT ABDOMEN AND PELVIS WITH CONTRAST  Technique:  Multidetector CT imaging of the abdomen and pelvis was performed following the standard protocol during bolus administration of intravenous contrast.  Contrast: 60m OMNIPAQUE IOHEXOL 300 MG/ML  SOLN  Comparison: CT of abdomen and pelvis 11/15/2011.  Findings:  Lung Bases: Minimal dependent atelectasis in the lower lobes of the lungs bilaterally.  Otherwise, unremarkable.  Abdomen/Pelvis:  The enhanced appearance of the liver, gallbladder, pancreas, spleen, bilateral adrenal glands and bilateral kidneys is unremarkable.  There are numerous mildly dilated loops of small bowel, predominantly in the region of the distal jejunum and distal ileum, measuring up to 3.4 cm in diameter.  These are filled with oral contrast and have air fluid levels present.  The distal ileum appears markedly thickened and there are some surrounding inflammatory  changes, likely from an acute exacerbation of the patient's known Crohn disease.  Postoperative changes of appendectomy are noted.  There is a small volume of free fluid within the cul-de-sac, presumably reactive to inflammation.  No larger volume of ascites is noted.  No pneumoperitoneum. Atherosclerotic calcifications are noted throughout the abdominal and pelvic vasculature, without evidence of aneurysm.  The patient is status post hysterectomy.  The ovaries are not well visualized and may either be atrophic or may be surgically removed.  Urinary bladder is unremarkable in appearance.  Musculoskeletal: There are no aggressive appearing lytic or blastic lesions noted in the visualized portions of the skeleton.  IMPRESSION: 1.  Findings, as above, compatible with acute exacerbation of the patient's known Crohn disease with extensive thickening and inflammation of the distal ileum.  At this time, there is mild dilatation of portions of the distal jejunum and ileum, suggesting some partial obstruction related to the inflammatory changes in the distal ileum. 2.  Small volume of free fluid the pelvis is presumably reactive. 3.  Atherosclerosis. 4.  Status post hysterectomy and appendectomy.  Original Report Authenticated By: DEtheleen Mayhew M.D.   Dg Abd Acute W/chest  01/13/2012  *RADIOLOGY REPORT*  Clinical Data: Abdominal pain, concern for obstruction  ACUTE ABDOMEN SERIES (ABDOMEN 2 VIEW & CHEST 1 VIEW)  Comparison: 06/05/2011  Findings: Cardiomediastinal silhouette is stable.  No acute infiltrate or pleural effusion.  No pulmonary edema.  Mild distended small bowel loops mid abdomen with air-fluid level suspicious for early bowel obstruction or ileus.  No free abdominal air.  IMPRESSION: Mild distended small bowel loops mid abdomen with air-fluid level suspicious for early bowel obstruction or ileus.  Original Report Authenticated By: LLahoma Crocker M.D.    Brief H and P: For complete details please refer  to admission H and P, but in brief  Sharon [name] is an 56y.o. female with a history of Crohn's Disease since the year 2000 who presents to the Ed with complaints of severe unremitting RLQ ABD Pain for the past 2-3 weeks which worsened over the past 24 hours. She reports having nausea and vomiting Today. She denies any bloody stools. She describes the pain as being crampy, and rates the intensity of the pain as an 8 out of 10. She denies any fevers or chills. In the ED a CT scan of the ABD was performed and she was found to have evidence of  Crohn's Colitis and a partial SBO. She was referred for admission. Her Gastroenterologist is Dr. [name].    Hospital Course:  Principal Problem:  *Crohn's colitis/ Partial small bowel obstruction  Patient was admitted to the medical floor, she was treated with IV ciprofloxacin ,Flagyl and Solu-Medrol. She was placed on clear liquid diet and treated symptomatically for nausea vomiting. Patient was seen by GI who recommended to switch IV steroids to Entocort on discharge. Diet was advanced and patient tolerated that very well .Marland Kitchen  Repeat KUB abdomen showed no bowel obstruction and probably mild ileus as above. Patient had bowel movements and has no nausea or vomiting today. Active Problems:  Hypertension:  Controlled, continued on metoprolol.  UTI:  -Urine cultures grew group B strep, will discharge patient on Levaquin (patient also has pneumonia on x-ray.) Pneumonia:  Patient admitted that had  Occasional  coughing and her cough is productive of yellowish sputum,she denies any shortness of breath, fever or chills.  x-ray showed right lower lobe consolidation. We'll discharge on Levaquin. Tobacco abuse:  -counseled on cessation  History of depression:  -Stable, Continued on outpatient  meds   Subjective: Patient seen and examined, feeling much better denies any abdominal pain, nausea vomiting.   Filed Vitals:   01/16/12 0545  BP: 125/68  Pulse: 62    Temp: 98 F (36.7 C)  Resp: 18    General: Alert, awake, oriented x3, in no acute distress. Heart: Regular rate and rhythm, without murmurs, rubs, gallops. Lungs: Clear to auscultation bilaterally. Abdomen: Soft,  mild tenderness, nondistended, positive bowel sounds. Extremities: No clubbing cyanosis or edema with positive pedal pulses. Neuro: Grossly intact, nonfocal.   Disposition and Follow-up:  To home Follow with Dr. [name] and PCP ASAP   Time spent on Discharge: Approximately 40 minutes   Signed: Hulan Szumski 01/16/2012, 9:56 AM

## 2012-01-16 NOTE — Progress Notes (Signed)
Pt. discharge to floor,verbalized understanding of discharged instruction,medication,restriction,diet and follow up appointment.Baseline Vitals sign stable,Pt comfortable,no sign and symptom of distress.

## 2012-01-17 ENCOUNTER — Telehealth: Payer: Self-pay

## 2012-01-17 NOTE — Telephone Encounter (Signed)
Pt left v/m with no message only left phone #. I called [phone] and left v/m for pt to call back.

## 2012-01-18 ENCOUNTER — Encounter: Payer: Self-pay | Admitting: Family Medicine

## 2012-01-18 ENCOUNTER — Ambulatory Visit (INDEPENDENT_AMBULATORY_CARE_PROVIDER_SITE_OTHER): Payer: BC Managed Care – PPO | Admitting: Family Medicine

## 2012-01-18 VITALS — BP 168/100 | HR 76 | Temp 98.8°F | Wt 153.0 lb

## 2012-01-18 DIAGNOSIS — K5010 Crohn's disease of large intestine without complications: Secondary | ICD-10-CM

## 2012-01-18 DIAGNOSIS — I10 Essential (primary) hypertension: Secondary | ICD-10-CM

## 2012-01-18 DIAGNOSIS — F341 Dysthymic disorder: Secondary | ICD-10-CM

## 2012-01-18 DIAGNOSIS — Z8701 Personal history of pneumonia (recurrent): Secondary | ICD-10-CM | POA: Insufficient documentation

## 2012-01-18 DIAGNOSIS — F329 Major depressive disorder, single episode, unspecified: Secondary | ICD-10-CM

## 2012-01-18 DIAGNOSIS — F419 Anxiety disorder, unspecified: Secondary | ICD-10-CM

## 2012-01-18 DIAGNOSIS — F32A Depression, unspecified: Secondary | ICD-10-CM

## 2012-01-18 DIAGNOSIS — K5090 Crohn's disease, unspecified, without complications: Secondary | ICD-10-CM

## 2012-01-18 DIAGNOSIS — J189 Pneumonia, unspecified organism: Secondary | ICD-10-CM

## 2012-01-18 MED ORDER — METOPROLOL SUCCINATE ER 25 MG PO TB24: 25.0000 mg | ORAL_TABLET | Freq: Every day | ORAL | Status: DC

## 2012-01-18 MED ORDER — LORAZEPAM 1 MG PO TABS: 1.0000 mg | ORAL_TABLET | Freq: Three times a day (TID) | ORAL | Status: DC | PRN

## 2012-01-18 NOTE — Patient Instructions (Addendum)
I've refilled ativan and metoprolol XL. Pass by Marion's office for referral to [facility] for second opinion on Crohn's. Good to see you today, call us with questions. Once feeling better, increase amitriptyline to 4m nightly

## 2012-01-18 NOTE — Progress Notes (Signed)
  Subjective:    Patient ID: Sharon [name], female    DOB: September 28, 1955, 56 y.o.   MRN: [id number]  HPI VQ:XIHW f/u  Pleasant 56 yo pt of mine with h/o Crohn's disease (h/o stenotic crohn's, active in TI, started [place], PPD neg ([facility] Dr. [name])) and anxiety presents as hospital follow up.  Admitted 5/17-20/2013 with crohn's flare and concern for partial SBO.  Her crohn's flare tends to manifest as RLQ pain with bowel changes.  Initially placed on IV steroids as well as flagyl and cipro IV.  Developed cough productive of yellow mucous during hospitalization, never LUTS.  Found to have >100k GBS UTI as well as PNA by CT scan - treated with levaquin x7 days.  Sent home with flagyl tid as well.    Since discharge, still feeling weak, tired.  abd pain improving.  Bowels moving but watery, passing gas.  No blood.  Tolerating soft regular diet but has backed off this as causes bloating.  Some nausea but overall improving.  No vomiting.  Sent home on entocort as well, with discussion by GI of restarting remicade.  Actually would like second opinion on several issues related to crohn's disease including different medical options.  Has been on cimzia for several months now.  Requests referral to [facility].  Past Medical History  Diagnosis Date  . Elevated blood pressure   . Migraines     and frequent other headaches (sinus or stress)  . Crohn's disease     h/o stenotic crohn's, active in TI, started [place], PPD neg ([facility] Dr. [name])  . Cervical cancer 12/2008    s/p hysterectomy  . History of chicken pox   . Anxiety and depression   . History of anemia     attributed to crohn's  . Arthritis   . Genital warts   . History of syphilis 1980s  . Smoker   . TIA (transient ischemic attack) 05/2010    at [facility] - TIA vs complex migraine (w/u negative - carotids, echo, TC doppler, and MRI normal)  . Hepatic steatosis 04/2011    diffuse on CT, 11m gallbladder polyp  . Vitamin B12 deficiency       Objective:   Physical Exam  Nursing note and vitals reviewed. Constitutional: She appears well-developed and well-nourished. No distress.  HENT:  Head: Normocephalic and atraumatic.  Mouth/Throat: Oropharynx is clear and moist. No oropharyngeal exudate.  Eyes: Conjunctivae and EOM are normal. Pupils are equal, round, and reactive to light.  Cardiovascular: Normal rate, regular rhythm, normal heart sounds and intact distal pulses.   No murmur heard. Pulmonary/Chest: Effort normal and breath sounds normal. No respiratory distress. She has no wheezes. She has no rales.  Abdominal: Soft. Bowel sounds are normal. She exhibits distension. She exhibits no mass. There is tenderness (mild diffuse). There is no rebound and no guarding.  Musculoskeletal: She exhibits no edema.  Skin: Skin is warm and dry. No rash noted.       Assessment & Plan:

## 2012-01-18 NOTE — Assessment & Plan Note (Signed)
By CT scan.  Advised finish course of antibiotic.  Minimal cough.

## 2012-01-18 NOTE — Assessment & Plan Note (Signed)
Elevated today.  Given previous values have been normal, advised pt to continue to monitor.

## 2012-01-18 NOTE — Assessment & Plan Note (Signed)
Recent crohn's flare, slowly resolving.  As continued watery diarrhea, did recommend finish Flagyl course. Will refer to [facility] per pt request for second opinion on Crohn's and medication options.

## 2012-01-18 NOTE — Assessment & Plan Note (Signed)
Again recommended backing off ativan as able. On amitriptyline 12.72m at bedtime for headache ppx.   Recommended once feeling better, to increase amitriptyline to 240mat bedtime to see if better control of anxiety as well as headaches. Continue celexa 4047m

## 2012-01-19 ENCOUNTER — Ambulatory Visit: Payer: BC Managed Care – PPO | Admitting: Family Medicine

## 2012-01-19 ENCOUNTER — Ambulatory Visit: Payer: BC Managed Care – PPO | Admitting: Psychology

## 2012-01-20 NOTE — Telephone Encounter (Signed)
Left v/m pt call back.

## 2012-01-23 ENCOUNTER — Encounter: Payer: Self-pay | Admitting: Family Medicine

## 2012-01-25 ENCOUNTER — Telehealth: Payer: Self-pay | Admitting: Family Medicine

## 2012-01-25 ENCOUNTER — Ambulatory Visit: Payer: BC Managed Care – PPO | Admitting: Family Medicine

## 2012-01-25 NOTE — Telephone Encounter (Signed)
Pt dropped off a form to be filled out. I put it in your inbox on your desk.

## 2012-01-25 NOTE — Telephone Encounter (Addendum)
H/o headaches, migraines. crohn's. I don't think she needs ER evaluation.  May see tomorrow, or if significantly bad today and we cannot get her into office this afternoon (i'm out), recommend urgent care evaluation. has she increased amitriptyline nightly?

## 2012-01-25 NOTE — Telephone Encounter (Signed)
Caller: Sharon [name]/Patient; PCP: Sharon [name]; CB#: [phone]; ; ; Call regarding BP Is High; states has been feeling poorly and checked her BP.  Current reading 157/98.  States has "worse than usual" headache today.  States having vision changes/spots, flashing lights.  Per protocol, advised being seen ED/within an hour; patient refuses to see anyone but Dr. Danise [name].  TC to office; advised ED.  Patient states she just returned to work, but will go to ED after work.

## 2012-01-25 NOTE — Telephone Encounter (Signed)
Spoke with patient. She says she felt fine with waiting until tomorrow. She just could tell her BP was slightly elevated and wanted you to be aware and that was why she had called. She had it checked at pharmacy this afternoon and it was 136/98. She will go to ER or [facility] if she feels worse, but right now she feels fine waiting to be seen tomorrow. Appt scheduled.

## 2012-01-26 ENCOUNTER — Ambulatory Visit (INDEPENDENT_AMBULATORY_CARE_PROVIDER_SITE_OTHER): Payer: BC Managed Care – PPO | Admitting: Family Medicine

## 2012-01-26 ENCOUNTER — Encounter: Payer: Self-pay | Admitting: Family Medicine

## 2012-01-26 VITALS — BP 148/100 | HR 72 | Temp 98.1°F | Wt 152.0 lb

## 2012-01-26 DIAGNOSIS — I10 Essential (primary) hypertension: Secondary | ICD-10-CM

## 2012-01-26 DIAGNOSIS — G43909 Migraine, unspecified, not intractable, without status migrainosus: Secondary | ICD-10-CM

## 2012-01-26 NOTE — Telephone Encounter (Signed)
Pt has appt to see Dr Danise Mina today.

## 2012-01-26 NOTE — Progress Notes (Signed)
  Subjective:    Patient ID: Sharon [name], female    DOB: [birth year], 56 y.o.   MRN: [id number]  HPI CC: elevated bp and HA  Sharon [name] presents today with recently elevated blood pressures and headache over last few days.  Noticed increasing headaches and blood pressure since getting out of hospital.  Last night went home, took medicine, and drank tea.  Took ambien and slept really well.  HA resolved but continued elevated blood pressure.  H/o migraines.  HA described as constant dull throbbing ache.  Worse with noise.  Not associated with nausea  Yesterday bp at home 130-150/90s.  Only on metoprolol xl 76m daily for h/o HTN.  BP Readings from Last 3 Encounters:  01/26/12 148/100  01/18/12 168/100  01/16/12 135/72   Decided to stay with [facility].  Past Medical History  Diagnosis Date  . HTN (hypertension)   . Migraines     and frequent other headaches (sinus or stress)  . Crohn's disease     h/o stenotic crohn's, active in TI, PPD neg ([facility] Dr. EOletta Lamas; started cimzia, ?failure given flare despite treatment, consdering remicade  . Cervical cancer 12/2008    s/p hysterectomy  . History of chicken pox   . Anxiety and depression   . History of anemia     attributed to crohn's  . Arthritis   . Genital warts   . History of syphilis 1980s  . Smoker   . TIA (transient ischemic attack) 05/2010    [facility]- [facility] vs complex migraine (w/u negative - carotids, echo, TC doppler, and MRI normal)  . Hepatic steatosis 04/2011    diffuse on CT, 524mgallbladder polyp  . Vitamin B12 deficiency      Review of Systems Per HPI    Objective:   Physical Exam  Nursing note and vitals reviewed. Constitutional: She is oriented to person, place, and time. She appears well-developed and well-nourished. No distress.  HENT:  Head: Normocephalic and atraumatic.  Mouth/Throat: Oropharynx is clear and moist. No oropharyngeal exudate.  Eyes: Conjunctivae and EOM are normal. Pupils are  equal, round, and reactive to light. No scleral icterus.  Neck: Normal range of motion. Neck supple. Carotid bruit is not present.  Cardiovascular: Normal rate, regular rhythm, normal heart sounds and intact distal pulses.   No murmur heard. Pulmonary/Chest: Effort normal and breath sounds normal. No respiratory distress. She has no wheezes. She has no rales.  Lymphadenopathy:    She has no cervical adenopathy.  Neurological: She is alert and oriented to person, place, and time. She has normal strength. No cranial nerve deficit or sensory deficit. Coordination normal.       Normal FTN/HTS  Skin: Skin is warm and dry. No rash noted.  Psychiatric: She has a normal mood and affect.       Assessment & Plan:

## 2012-01-26 NOTE — Patient Instructions (Signed)
Increase amitriptyline to 40m nightly for headache prevnetion Increase metoprolol to 530mnightly (2 pills) new dose will be 5038m1 pill). Call us [place] update on home blood pressures in 2 weeks.

## 2012-01-26 NOTE — Assessment & Plan Note (Signed)
nonfocal exam today. Increase amitriptyline to 50m nightly.

## 2012-01-26 NOTE — Assessment & Plan Note (Signed)
Start with increase in metoprolol XL to 56m daily Call me in 2 wks with home bp update.

## 2012-01-31 ENCOUNTER — Encounter: Payer: Self-pay | Admitting: Family Medicine

## 2012-02-02 ENCOUNTER — Ambulatory Visit: Payer: BC Managed Care – PPO | Admitting: Psychology

## 2012-02-07 ENCOUNTER — Telehealth: Payer: Self-pay

## 2012-02-07 MED ORDER — METOPROLOL SUCCINATE ER 50 MG PO TB24: 50.0000 mg | ORAL_TABLET | Freq: Every day | ORAL | Status: DC

## 2012-02-07 NOTE — Telephone Encounter (Signed)
Patient notified

## 2012-02-07 NOTE — Telephone Encounter (Signed)
Pt request metoprolol 50 mg to [facility]. 02/02/12 BP 154/86 pulse 72;   02/07/12 BP 156/88 pulse 76. Pt feels better; less frequent h/a. Pt not taking Cimzia; will start Remacade on 02/12/12.

## 2012-02-07 NOTE — Telephone Encounter (Signed)
Noted. Thanks.  Sent in. To keep eye on bp, if consistently >140/90, to let me know and will need further titration of med.

## 2012-02-10 ENCOUNTER — Ambulatory Visit: Payer: BC Managed Care – PPO

## 2012-02-10 ENCOUNTER — Other Ambulatory Visit (HOSPITAL_COMMUNITY): Payer: Self-pay | Admitting: *Deleted

## 2012-02-11 ENCOUNTER — Encounter: Payer: Self-pay | Admitting: Family Medicine

## 2012-02-13 ENCOUNTER — Encounter (HOSPITAL_COMMUNITY)
Admission: RE | Admit: 2012-02-13 | Discharge: 2012-02-13 | Disposition: A | Discharge status: Home / Self Care | Payer: BC Managed Care – PPO | Source: Ambulatory Visit | Attending: Gastroenterology | Admitting: Gastroenterology

## 2012-02-13 DIAGNOSIS — K5090 Crohn's disease, unspecified, without complications: Secondary | ICD-10-CM | POA: Insufficient documentation

## 2012-02-13 MED ORDER — SODIUM CHLORIDE 0.9 % IV SOLN
INTRAVENOUS | Status: DC
  Administered 2012-02-13: 11:00:00 via INTRAVENOUS

## 2012-02-13 MED ORDER — ACETAMINOPHEN 500 MG PO TABS
1000.0000 mg | ORAL_TABLET | ORAL | Status: DC
  Administered 2012-02-13: 1000 mg via ORAL
  Filled 2012-02-13: qty 2

## 2012-02-13 MED ORDER — SODIUM CHLORIDE 0.9 % IV SOLN
400.0000 mg | INTRAVENOUS | Status: DC
  Administered 2012-02-13: 400 mg via INTRAVENOUS
  Filled 2012-02-13: qty 40

## 2012-02-20 ENCOUNTER — Other Ambulatory Visit: Payer: Self-pay

## 2012-02-20 NOTE — Telephone Encounter (Signed)
Pt request refill on Lorazepam. [facility] has 3 refills waiting. Pt to contact [facility].

## 2012-02-21 ENCOUNTER — Ambulatory Visit: Payer: BC Managed Care – PPO

## 2012-02-28 ENCOUNTER — Ambulatory Visit (INDEPENDENT_AMBULATORY_CARE_PROVIDER_SITE_OTHER): Payer: BC Managed Care – PPO | Admitting: *Deleted

## 2012-02-28 DIAGNOSIS — E538 Deficiency of other specified B group vitamins: Secondary | ICD-10-CM

## 2012-02-28 MED ORDER — CYANOCOBALAMIN 1000 MCG/ML IJ SOLN
1000.0000 ug | Freq: Once | INTRAMUSCULAR | Status: AC
Start: 2012-02-28 — End: 2012-02-28
  Administered 2012-02-28: 1000 ug via INTRAMUSCULAR

## 2012-03-09 ENCOUNTER — Other Ambulatory Visit: Payer: Self-pay | Admitting: Internal Medicine

## 2012-03-12 ENCOUNTER — Telehealth: Payer: Self-pay | Admitting: Family Medicine

## 2012-03-12 NOTE — Telephone Encounter (Signed)
Spoke with patient. She is still having shoulder pain. No more chest pain-she really felt that was anxiety related. I went ahead and scheduled her for tomorrow to be checked out. She has had some changes in meds and isn't sure if symptoms are coming from that or from something else.

## 2012-03-12 NOTE — Telephone Encounter (Signed)
Triage Record Num: [id number] Operator: Prentice Docker [place] Patient Name: Sharon [name] Call Date & Time: 03/09/2012 7:01:24PM Patient Phone: [phone] PCP: Ria Bush Patient Gender: Female PCP Fax : [phone] Patient DOB: 15-May-1956 Practice Name: [facility] Reason for Call: Caller: Kristi/Patient; PCP: Ria Bush; CB#: [phone]; Call regarding Knee and back pain; Onset not feeling well this week. Yesterday and today worse. States she has fallen yesterday and this morning. Hard to walk, weakness, at times head spinning. Hurts to breathe. Sick to stomach, took omeprazole this am. "Tightness in chest, like she may pass out, May be hyperventilation" " Has felt tightness in chest today". Emergent S&S idetified per Chest pain Protocol Adult, " New or worsening S&S that may indicate shock." Advised 911. Caller [place] and may go there to get her vitals checked. I advised her again to call 911 again. Unknown at this time if she will call 911. Protocol(s) Used: Chest Pain Recommended Outcome per Protocol: Activate EMS 911 Reason for Outcome: New or worsening signs and symptoms that may indicate shock Care Advice: ~ Protect the patient from falling or other harm. ~ An adult should stay with the patient, preferably one trained in CPR. Lay the person down and elevate legs at least 12 inches (30 cm) above level of heart. Cover to help maintain body temperature. ~ ~ IMMEDIATE ACTION Write down provider's name. List or place the following in a bag for transport with the patient: current prescription and/or nonprescription medications; alternative treatments, therapies and medications; and street drugs. ~ 03/09/2012 7:12:07PM Page 1 of 1 [id number]

## 2012-03-12 NOTE — Telephone Encounter (Signed)
Can we call for an update on how she's feeling?  Thanks.

## 2012-03-12 NOTE — Telephone Encounter (Signed)
Will see tomorrow

## 2012-03-13 ENCOUNTER — Encounter: Payer: Self-pay | Admitting: Family Medicine

## 2012-03-13 ENCOUNTER — Ambulatory Visit (INDEPENDENT_AMBULATORY_CARE_PROVIDER_SITE_OTHER): Payer: BC Managed Care – PPO | Admitting: Family Medicine

## 2012-03-13 ENCOUNTER — Ambulatory Visit (INDEPENDENT_AMBULATORY_CARE_PROVIDER_SITE_OTHER)
Admission: RE | Admit: 2012-03-13 | Discharge: 2012-03-13 | Disposition: A | Discharge status: Home / Self Care | Payer: BC Managed Care – PPO | Source: Ambulatory Visit | Attending: Family Medicine | Admitting: Family Medicine

## 2012-03-13 VITALS — BP 110/70 | HR 84 | Temp 98.0°F | Wt 156.8 lb

## 2012-03-13 DIAGNOSIS — M25519 Pain in unspecified shoulder: Secondary | ICD-10-CM

## 2012-03-13 DIAGNOSIS — I10 Essential (primary) hypertension: Secondary | ICD-10-CM

## 2012-03-13 DIAGNOSIS — R3915 Urgency of urination: Secondary | ICD-10-CM

## 2012-03-13 DIAGNOSIS — M25511 Pain in right shoulder: Secondary | ICD-10-CM | POA: Insufficient documentation

## 2012-03-13 MED ORDER — CYCLOBENZAPRINE HCL 10 MG PO TABS: 10.0000 mg | ORAL_TABLET | Freq: Two times a day (BID) | ORAL | Status: AC | PRN

## 2012-03-13 MED ORDER — DICLOFENAC SODIUM 1 % TD GEL: 1.0000 "application " | Freq: Three times a day (TID) | TRANSDERMAL | Status: DC | PRN

## 2012-03-13 NOTE — Progress Notes (Signed)
  Subjective:    Patient ID: Sharon [name], female    DOB: 1956/02/03, 56 y.o.   MRN: [id number]  HPI CC: joint pains  R shoulder pain for last 1 1/2 wks.  Saturday got really bad.  Points to anterior shoulder joint.  Worse with lifting bag or driving.  Tried tylenol for pain as well as narcotic pain pills she had at home (recent tooth pulled).  Tylenol helps some.  Has had recent falls.  Latest walking up stairs about 2 wks ago.  Did hit arm and shoulder with fall.  Denies weakness in hands.  No neck pain.  + numbness in hands.   Lab Results  Component Value Date   VITAMINB12 263 05/18/2011   Anxiety - takes lorazepam 33m tid.  Also with knee pain.  Continues to have lower back pain, urgency, frequency.  No dysuria, hematuria, n/v, fevers/chills, abd pain.  Stopped entocort and cimzia.  Started remicade recently per GI - 02/11/2012 was first infusion.  Feels improved overall with new med.  BP significantly better of cimzia.  Feels retaining fluid.  Wt Readings from Last 3 Encounters:  03/13/12 156 lb 12 oz (71.101 kg)  02/13/12 150 lb (68.04 kg)  01/26/12 152 lb (68.947 kg)    Review of Systems Per HPI    Objective:   Physical Exam  Nursing note and vitals reviewed. Constitutional: She appears well-developed and well-nourished. No distress.  Cardiovascular:  Pulses:      Radial pulses are 2+ on the right side, and 2+ on the left side.  Musculoskeletal:       FROM at neck, neg spurling. No midline spine tenderness. Tight left thoracic paraspinous mm and right horizontal trapezius belly - tender to palpation. Tender to palpation at subacromial bursa as well as with palpation along clavicle and acromion. No significant pain/weakness with testing RTC except positive empty can sign. No significant pain or catching with [facility]. Neg crossover test.  Neurological:       Sensation and strength intact  Skin: Skin is warm and dry. No rash noted. No  erythema.  Psychiatric: She has a normal mood and affect.      Assessment & Plan:

## 2012-03-13 NOTE — Assessment & Plan Note (Signed)
Much better off cimzia, continue to monitor.

## 2012-03-13 NOTE — Patient Instructions (Addendum)
xrays today looked ok. I think you have shoulder bursitis leading to this pain - do stretching exercises provided. I've sent in a topical anti inflammatory to try on shoulder (may need prior authorization). Will also prescribe muscle relaxant for significant muscle tightness you currently have. If not better, we may discuss orthopedic referral.

## 2012-03-13 NOTE — Assessment & Plan Note (Signed)
Anticipate subacromial bursitis.  Given pain worse since fall, will check R shoulder xray to further evaluate sxs - ok on my read, no fracture.  ?mild acromial arthritis, will await radiology read. Unable to tolerate NSAIDs. Will treat with continued tylenol, start flexeril and voltaren gel for R shoulder.   If not better, consider subacromial bursitis injection and/or ortho referral. Provided today with SM pt advisor handout on subacromial bursitis

## 2012-03-13 NOTE — Assessment & Plan Note (Signed)
I did want to check UA given sxs, however pt left prior to collecting sample.  I will ask kim to have pt return to drop off sample.

## 2012-03-19 ENCOUNTER — Telehealth: Payer: Self-pay

## 2012-03-19 NOTE — Telephone Encounter (Signed)
Prior auth needed for Voltaren Gel. Given phone approval Case ID# [id number]. Approval date 02/18/12 thru 03/19/13. Approval letter to be faxed at later time. [facility] notified by phone.

## 2012-03-20 ENCOUNTER — Other Ambulatory Visit: Payer: Self-pay | Admitting: Family Medicine

## 2012-03-20 DIAGNOSIS — R3915 Urgency of urination: Secondary | ICD-10-CM

## 2012-03-20 LAB — POCT URINALYSIS DIPSTICK
Ketones, UA: NEGATIVE
Protein, UA: 30
Spec Grav, UA: 1.025

## 2012-03-20 MED ORDER — CIPROFLOXACIN HCL 250 MG PO TABS: 250.0000 mg | ORAL_TABLET | Freq: Two times a day (BID) | ORAL | Status: AC

## 2012-03-20 NOTE — Addendum Note (Signed)
Addended by: Royann Shivers A on: 03/20/2012 05:28 PM   Modules accepted: Orders

## 2012-03-20 NOTE — Telephone Encounter (Signed)
PA letter of approval received sent to Dr Danise Mina for signature and scanning. Letter faxed [facility].

## 2012-03-21 NOTE — Addendum Note (Signed)
Addended by: Royann Shivers A on: 03/21/2012 09:55 AM   Modules accepted: Orders

## 2012-03-23 LAB — URINE CULTURE: Colony Count: NO GROWTH

## 2012-03-26 ENCOUNTER — Encounter (HOSPITAL_COMMUNITY)
Admission: RE | Admit: 2012-03-26 | Discharge: 2012-03-26 | Disposition: A | Discharge status: Home / Self Care | Payer: BC Managed Care – PPO | Source: Ambulatory Visit | Attending: Gastroenterology | Admitting: Gastroenterology

## 2012-03-26 ENCOUNTER — Ambulatory Visit (INDEPENDENT_AMBULATORY_CARE_PROVIDER_SITE_OTHER): Payer: BC Managed Care – PPO | Admitting: Family Medicine

## 2012-03-26 ENCOUNTER — Ambulatory Visit: Payer: BC Managed Care – PPO | Admitting: Family Medicine

## 2012-03-26 ENCOUNTER — Telehealth: Payer: Self-pay | Admitting: Family Medicine

## 2012-03-26 ENCOUNTER — Encounter: Payer: Self-pay | Admitting: Family Medicine

## 2012-03-26 VITALS — BP 118/64 | HR 84 | Temp 98.2°F | Ht 61.5 in | Wt 158.2 lb

## 2012-03-26 DIAGNOSIS — I10 Essential (primary) hypertension: Secondary | ICD-10-CM | POA: Insufficient documentation

## 2012-03-26 DIAGNOSIS — F341 Dysthymic disorder: Secondary | ICD-10-CM | POA: Insufficient documentation

## 2012-03-26 DIAGNOSIS — G43909 Migraine, unspecified, not intractable, without status migrainosus: Secondary | ICD-10-CM | POA: Insufficient documentation

## 2012-03-26 DIAGNOSIS — R319 Hematuria, unspecified: Secondary | ICD-10-CM

## 2012-03-26 DIAGNOSIS — Z8673 Personal history of transient ischemic attack (TIA), and cerebral infarction without residual deficits: Secondary | ICD-10-CM | POA: Insufficient documentation

## 2012-03-26 DIAGNOSIS — F17200 Nicotine dependence, unspecified, uncomplicated: Secondary | ICD-10-CM | POA: Insufficient documentation

## 2012-03-26 DIAGNOSIS — M25519 Pain in unspecified shoulder: Secondary | ICD-10-CM

## 2012-03-26 DIAGNOSIS — M25511 Pain in right shoulder: Secondary | ICD-10-CM

## 2012-03-26 DIAGNOSIS — K5090 Crohn's disease, unspecified, without complications: Secondary | ICD-10-CM | POA: Insufficient documentation

## 2012-03-26 LAB — POCT URINALYSIS DIPSTICK
Glucose, UA: NEGATIVE
Nitrite, UA: NEGATIVE
Spec Grav, UA: 1.01
Urobilinogen, UA: 0.2

## 2012-03-26 MED ORDER — ACETAMINOPHEN 500 MG PO TABS
1000.0000 mg | ORAL_TABLET | ORAL | Status: DC
  Administered 2012-03-26: 1000 mg via ORAL
  Filled 2012-03-26: qty 2

## 2012-03-26 MED ORDER — SODIUM CHLORIDE 0.9 % IV SOLN
400.0000 mg | INTRAVENOUS | Status: DC
  Administered 2012-03-26: 400 mg via INTRAVENOUS
  Filled 2012-03-26: qty 40

## 2012-03-26 MED ORDER — SODIUM CHLORIDE 0.9 % IV SOLN
INTRAVENOUS | Status: AC
  Administered 2012-03-26: 12:00:00 via INTRAVENOUS

## 2012-03-26 NOTE — Progress Notes (Signed)
  Subjective:    Patient ID: Sharon [name], female    DOB: 20-Oct-1955, 56 y.o.   MRN: [id number]  HPI CC: f/u R shoulder pain  Seen here 03/13/2012 for eval of R shoulder pain after fall with normal R shoulder xrays.  Thought had subacromial bursitis, treated with stretching exercises, tylenol, flexeril and voltaren gel.  Unable to tolerate NSAIDs 2/2 crohn's.   Discussed if not better to return for possible steroid injection. Over weekend worse shoulder pain.  Propped up shoulder with pillows overnight and stopped using arm as much, pain better today.  Tylenol/flexeril not really helping.  Denies weakness in hands.  No neck pain.  Some tingling in hands.  Past Medical History  Diagnosis Date  . HTN (hypertension)   . Migraines     and frequent other headaches (sinus or stress)  . Crohn's disease     h/o stenotic crohn's, active in TI, PPD neg ([facility] Dr. [name]); started cimzia, ?failure given flare despite treatment, consdering remicade  . Cervical cancer 12/2008    s/p hysterectomy  . History of chicken pox   . Anxiety and depression   . History of anemia     attributed to crohn's  . Arthritis   . Genital warts   . History of syphilis 1980s  . Smoker   . TIA (transient ischemic attack) 05/2010    at [facility] - TIA vs complex migraine (w/u negative - carotids, echo, TC doppler, and MRI normal)  . Hepatic steatosis 04/2011    diffuse on CT, 32m gallbladder polyp  . B12 deficiency     pernicious anemia - monthly b12 shots    Review of Systems Per HPI    Objective:   Physical Exam  Nursing note and vitals reviewed. Constitutional: She appears well-developed and well-nourished. No distress.  Musculoskeletal: She exhibits no edema.       No midline spine tenderness. FROM at neck. Tender to palpation right shoulder at acromion, AC joint, and at deltoid muscle as well as at back at right lateral trap muscle.   No significant pain with int/ext rotation at shoulder. FROM  at shoulders although pain present with abduction. Neg empty can sign, neg yergason and speed test. + neer sign on right.  + crossover test on right.  Skin: Skin is warm and dry. No rash noted.  Psychiatric: She has a normal mood and affect.       Assessment & Plan:

## 2012-03-26 NOTE — Telephone Encounter (Signed)
Patient Name: Sharon [name] Call Date & Time: 03/24/2012 2:13:03PM Patient Phone: [phone] PCP: Ria Bush Patient Gender: Female PCP Fax : [phone] Patient DOB: October 26, 1955 Practice Name: [facility] Reason for Call: Caller: Ave/Patient; PCP: Ria Bush; CB#: [phone]; Call regarding Arm pain; onset 2wks ago; seen in the office 03/19/12; by Dr.Gutierrez and dx with bursitis; using Voltaren cream, but pain is increasing; unable to take Ibuprofen; has appt sched for 03/26/12; was told that a shot could be given if pain not resolved and has moved into the elbow; All emergent sxs of Arm Non-Injury protocol r/o except "unbearable pain"; disp see ED; sent to [facility] Protocol(s) Used: Arm Non-Injury Recommended Outcome per Protocol: See ED Immediately Reason for Outcome: Unbearable pain Care Advice: ~ Another adult should drive. ~ Do not give the patient anything to eat or drink. ~ Remove any rings on the fingers of the affected hand, if possible. ~ IMMEDIATE ACTION Write down provider's name. List or place the following in a bag for transport with the patient: current prescription and/or nonprescription medications; alternative treatments, therapies and medications; and street drugs. ~ ~ Support part in position of comfort to reduce pain and swelling; avoid unnecessary movement. 07/

## 2012-03-26 NOTE — Patient Instructions (Signed)
We'll call you with results of urine. Let's give this shoulder more time to heal. This is likely combination of several different things going on - bursitis, possible rotator cuff injury, and AC joint inflammation, all after fall. Continue voltaren gel on right shoulder.  If not better in 3-4 weeks, we will discuss steroid injection.

## 2012-03-27 NOTE — Telephone Encounter (Signed)
I saw pt yesterday.

## 2012-03-27 NOTE — Assessment & Plan Note (Addendum)
Touched base with Dr. Loletha Grayer. Anticipate several issues going on - including subacromial bursitis, RTC tendinitis and possible small tear, and AC injury from fall Recommended continued ROM exercises.  Will give this injury more time to heal. As occurred after fall, I don't think injection would be prudent at this point, but discussed if not better in 3-4 weeks, consider returning for possible subacromial injection.   If worsening, return sooner.

## 2012-03-30 ENCOUNTER — Ambulatory Visit (INDEPENDENT_AMBULATORY_CARE_PROVIDER_SITE_OTHER): Payer: BC Managed Care – PPO | Admitting: *Deleted

## 2012-03-30 DIAGNOSIS — E538 Deficiency of other specified B group vitamins: Secondary | ICD-10-CM

## 2012-03-30 MED ORDER — CYANOCOBALAMIN 1000 MCG/ML IJ SOLN
1000.0000 ug | Freq: Once | INTRAMUSCULAR | Status: AC
  Administered 2012-03-30: 1000 ug via INTRAMUSCULAR

## 2012-04-14 ENCOUNTER — Other Ambulatory Visit: Payer: Self-pay | Admitting: Family Medicine

## 2012-04-16 ENCOUNTER — Telehealth: Payer: Self-pay

## 2012-04-16 MED ORDER — ZOLPIDEM TARTRATE 5 MG PO TABS: 5.0000 mg | ORAL_TABLET | Freq: Every evening | ORAL | Status: DC | PRN

## 2012-04-16 NOTE — Telephone Encounter (Addendum)
Before pt refills metoprolol; pt wants to know if needs to continue taking metoprolol last BP in office 02/2012 was normal range.pt has not taken at home.Please advise.+ Pt also request refill Ambien to [facility]. Please advise.

## 2012-04-16 NOTE — Telephone Encounter (Signed)
i assume she has been taking over last few months. I would continue for now.

## 2012-04-16 NOTE — Telephone Encounter (Signed)
Ok to send in [place].

## 2012-04-16 NOTE — Telephone Encounter (Signed)
Patient notified as instructed by telephone.Medication phoned to [place] as instructed.

## 2012-04-19 ENCOUNTER — Other Ambulatory Visit: Payer: Self-pay | Admitting: Family Medicine

## 2012-04-27 ENCOUNTER — Ambulatory Visit (INDEPENDENT_AMBULATORY_CARE_PROVIDER_SITE_OTHER): Payer: BC Managed Care – PPO | Admitting: Family Medicine

## 2012-04-27 ENCOUNTER — Encounter: Payer: Self-pay | Admitting: Family Medicine

## 2012-04-27 VITALS — BP 122/84 | HR 68 | Temp 98.4°F | Wt 157.2 lb

## 2012-04-27 DIAGNOSIS — M25511 Pain in right shoulder: Secondary | ICD-10-CM

## 2012-04-27 DIAGNOSIS — R21 Rash and other nonspecific skin eruption: Secondary | ICD-10-CM

## 2012-04-27 DIAGNOSIS — M25519 Pain in unspecified shoulder: Secondary | ICD-10-CM

## 2012-04-27 NOTE — Assessment & Plan Note (Addendum)
Anticipate RTC tendinitis as well as subacromial bursitis. Discussed pros/cons of steroid injection including risk of tendon rupture, infection. Pt opts for injection today. If not improved after injection, will refer to ortho for further evaluation.  continue ROM exercises.  shoulder steroid injection:  IC obtained and in chart.  Landmarks palpated and area cleaned with EtOH wipe.  Using posterior lateral approach, steroid injection today - 1cc depo medrol, 4cc lidocaine using 22g 1.5in needle.  Pt tolerated well.

## 2012-04-27 NOTE — Assessment & Plan Note (Signed)
Consistent with psoriatic rash.  ?remicade contributing.  Asked her to check with GI.

## 2012-04-27 NOTE — Progress Notes (Addendum)
Subjective:    Patient ID: Sharon [name], female    DOB: 04-21-1956, 56 y.o.   MRN: [id number]  HPI CC: f/u shoulder, new rash  Seen here 03/13/2012 for eval of R shoulder pain after fall with normal R shoulder xrays.  Seen again on 7/29 with anticipated subacromial bursitis, RTC tendinitis and possible small tear, and AC injury from fall.  Treated with stretching exercises as well as voltaren gel.  Presents today for f/u.  Continuous ache.  Able to function at work.  Continued pain radiating down to elbow.  Lateral shoulder  Rash - on back of legs and on back.  Now a few spots breaking out anterior lower legs.  Rash starting to dry up.  Going on for last month.  Not itchy or tender.  Thinks may be rash from remicade.  No new lotions, detergents, soaps, shampoos.  H/o psoriasis.  No new medicines, foods.  Also having knee aches.  R>L.    Medications and allergies reviewed and updated in chart.  Past histories reviewed and updated if relevant as below. Patient Active Problem List  Diagnosis  . HTN (hypertension)  . Migraines  . Crohn's disease  . Anxiety and depression  . Smoker  . TIA (transient ischemic attack)  . Healthcare maintenance  . Vitamin B12 deficiency  . Leukopenia  . Anxiety attack  . Headache  . Abdominal  pain, other specified site  . Crohn's colitis  . Partial bowel obstruction  . Pneumonia  . Right shoulder pain  . Urgency of urination  . Skin rash   Past Medical History  Diagnosis Date  . HTN (hypertension)   . Migraines     and frequent other headaches (sinus or stress)  . Crohn's disease     h/o stenotic crohn's, active in TI, PPD neg ([facility] Dr. [name]); started cimzia, ?failure given flare despite treatment, consdering remicade  . Cervical cancer 12/2008    s/p hysterectomy  . History of chicken pox   . Anxiety and depression   . History of anemia     attributed to crohn's  . Arthritis   . Genital warts   . History of syphilis 1980s  .  Smoker   . TIA (transient ischemic attack) 05/2010    at [facility] - TIA vs complex migraine (w/u negative - carotids, echo, TC doppler, and MRI normal)  . Hepatic steatosis 04/2011    diffuse on CT, 28m gallbladder polyp  . B12 deficiency     pernicious anemia - monthly b12 shots  . Scalp psoriasis    Past Surgical History  Procedure Date  . Appendectomy 1998  . Right colectomy 2000    crohn's disease, ileocecal resection  . Hospitalization 05/2010    TIA vs complex migraine - w/u negative - head CT, MRI/MRA, carotid dopplers, echo.  treated with ASA and tramadol  . [place] 05/2010    nl LV fxn ,EF 60%  . [place]    diffuse hepatic steatosis, 540mpolyp in gallbaldder fundus, no stones, mod abd ath  . Colonoscopy 10/2008    ileo-colonic anastomosis, ielocolonic crohn's  . Colonoscopy 05/2011    ileo-colonic anastomosis normal, normal mucosa  . Esophagogastroduodenoscopy 05/2011    nl esophagus, gastritis, nl duodenum  . Cervical biopsy  w/ loop electrode excision 10/2008  . Vaginal hysterectomy 12/2008    LAVH/BSO   History  Substance Use Topics  . Smoking status: Current Every Day Smoker -- 0.5 packs/day for 10  years    Types: Cigarettes  . Smokeless tobacco: Never Used  . Alcohol Use: No     remote EtOh use   Family History  Problem Relation Age of Onset  . Crohn's disease Daughter     crohn's, ulcerative colitis  . Ulcerative colitis Daughter   . Stroke Mother   . Hypertension Mother   . Hyperlipidemia Mother   . Hypertension Father   . Crohn's disease Father   . Crohn's disease Sister   . Cancer Sister     ovarian  . Crohn's disease Paternal Aunt   . Crohn's disease Paternal Uncle   . Diabetes Neg Hx    Allergies  Allergen Reactions  . Codeine Nausea And Vomiting  . Remeron (Mirtazapine)     Knocked out   Current Outpatient Prescriptions on File Prior to Visit  Medication Sig Dispense Refill  . acetaminophen (TYLENOL) 500 MG tablet Take  1,000 mg by mouth every 6 (six) hours as needed. For pain      . amitriptyline (ELAVIL) 25 MG tablet TAKE 1 TABLET BY MOUTH AT BEDTIME AS NEEDED FOR HEADACHE PREVENTION  30 tablet  11  . citalopram (CELEXA) 40 MG tablet TAKE 1 TABLET BY MOUTH DAILY  30 tablet  11  . cyanocobalamin (,VITAMIN B-12,) 1000 MCG/ML injection Inject 1,000 mcg into the muscle every 30 (thirty) days. Last dose 01/13/2012.      Marland Kitchen inFLIXimab (REMICADE) 100 MG injection Inject 100 mg into the vein every 6 (six) weeks.      Marland Kitchen LORazepam (ATIVAN) 1 MG tablet Take 1 tablet (1 mg total) by mouth 3 (three) times daily as needed. For anxiety.  90 tablet  3  . metoprolol succinate (TOPROL-XL) 50 MG 24 hr tablet Take 1 tablet (50 mg total) by mouth daily.  30 tablet  11  . omeprazole (PRILOSEC) 40 MG capsule Take 40 mg by mouth 2 (two) times daily.        . valACYclovir (VALTREX) 500 MG tablet TAKE 1 TABLET BY MOUTH EVERY DAY  30 tablet  6  . zolpidem (AMBIEN) 5 MG tablet Take 1 tablet (5 mg total) by mouth at bedtime as needed for sleep (insomnia).  30 tablet  0  . aspirin EC 81 MG tablet Take 1 tablet (81 mg total) by mouth daily.      . diclofenac sodium (VOLTAREN) 1 % GEL Apply 1 application topically 3 (three) times daily as needed.  100 g  1     Review of Systems Per HPI    Objective:   Physical Exam  Nursing note and vitals reviewed. Constitutional: She appears well-developed and well-nourished. No distress.  Musculoskeletal:       FROM at shoulders. Tender to palpation at lateral shoulder joint around deltoid. Tenderness especially with empty can sign and int rotation against resistance at shoulder. Improved neer sign compared to last month.  Skin: Skin is warm and dry. Rash noted.       Small erythematous silver scaly plaques throughout legs, trunk, older lesions just dry scaly       Assessment & Plan:

## 2012-04-27 NOTE — Patient Instructions (Addendum)
Steroid injection into right shoulder today. If worsening pain, redness, or warmth, please seek urgent care. Continue stretching exercises. If not better in next few weeks, please let us know for referral to orthopedist. Check with GI about skin rash.

## 2012-05-03 ENCOUNTER — Ambulatory Visit (INDEPENDENT_AMBULATORY_CARE_PROVIDER_SITE_OTHER): Payer: BC Managed Care – PPO

## 2012-05-03 DIAGNOSIS — E538 Deficiency of other specified B group vitamins: Secondary | ICD-10-CM

## 2012-05-03 MED ORDER — CYANOCOBALAMIN 1000 MCG/ML IJ SOLN
1000.0000 ug | Freq: Once | INTRAMUSCULAR | Status: AC
  Administered 2012-05-03: 1000 ug via INTRAMUSCULAR

## 2012-05-04 ENCOUNTER — Other Ambulatory Visit (HOSPITAL_COMMUNITY): Payer: Self-pay | Admitting: *Deleted

## 2012-05-04 MED ORDER — ACETAMINOPHEN 500 MG PO TABS: 1000.0000 mg | ORAL_TABLET | ORAL | Status: DC

## 2012-05-04 MED ORDER — SODIUM CHLORIDE 0.9 % IV SOLN: INTRAVENOUS | Status: DC

## 2012-05-04 MED ORDER — SODIUM CHLORIDE 0.9 % IV SOLN: 400.0000 mg | INTRAVENOUS | Status: DC

## 2012-05-07 ENCOUNTER — Encounter (HOSPITAL_COMMUNITY)
Admission: RE | Admit: 2012-05-07 | Discharge: 2012-05-07 | Disposition: A | Discharge status: Home / Self Care | Payer: BC Managed Care – PPO | Source: Ambulatory Visit | Attending: Gastroenterology | Admitting: Gastroenterology

## 2012-05-07 DIAGNOSIS — F341 Dysthymic disorder: Secondary | ICD-10-CM | POA: Insufficient documentation

## 2012-05-07 DIAGNOSIS — K5090 Crohn's disease, unspecified, without complications: Secondary | ICD-10-CM | POA: Insufficient documentation

## 2012-05-07 DIAGNOSIS — F17200 Nicotine dependence, unspecified, uncomplicated: Secondary | ICD-10-CM | POA: Insufficient documentation

## 2012-05-07 DIAGNOSIS — Z8673 Personal history of transient ischemic attack (TIA), and cerebral infarction without residual deficits: Secondary | ICD-10-CM | POA: Insufficient documentation

## 2012-05-07 DIAGNOSIS — I10 Essential (primary) hypertension: Secondary | ICD-10-CM | POA: Insufficient documentation

## 2012-05-07 DIAGNOSIS — G43909 Migraine, unspecified, not intractable, without status migrainosus: Secondary | ICD-10-CM | POA: Insufficient documentation

## 2012-05-07 MED ORDER — SODIUM CHLORIDE 0.9 % IV SOLN
5.0000 mg/kg | INTRAVENOUS | Status: DC
  Administered 2012-05-07: 400 mg via INTRAVENOUS
  Filled 2012-05-07: qty 40

## 2012-05-07 MED ORDER — ACETAMINOPHEN 500 MG PO TABS
1000.0000 mg | ORAL_TABLET | ORAL | Status: DC
  Administered 2012-05-07: 1000 mg via ORAL
  Filled 2012-05-07: qty 2

## 2012-05-07 MED ORDER — SODIUM CHLORIDE 0.9 % IV SOLN: 400.0000 mg/kg | INTRAVENOUS | Status: DC

## 2012-05-07 MED ORDER — SODIUM CHLORIDE 0.9 % IV SOLN
INTRAVENOUS | Status: DC
  Administered 2012-05-07: 09:00:00 via INTRAVENOUS

## 2012-05-08 ENCOUNTER — Ambulatory Visit: Payer: BC Managed Care – PPO | Admitting: Family Medicine

## 2012-05-08 DIAGNOSIS — Z0289 Encounter for other administrative examinations: Secondary | ICD-10-CM

## 2012-05-14 ENCOUNTER — Telehealth: Payer: Self-pay | Admitting: *Deleted

## 2012-05-14 DIAGNOSIS — M25511 Pain in right shoulder: Secondary | ICD-10-CM

## 2012-05-14 NOTE — Telephone Encounter (Signed)
Placed order in chart. plz send last 2 office notes and xray report.

## 2012-05-14 NOTE — Telephone Encounter (Signed)
Patient calling asking what should she do about her continued shoulder pain, pt states its not any better and she's still having problems, per pt was told by Dr.G he would send her to a "specialist" please advise on referral.

## 2012-05-15 ENCOUNTER — Encounter: Payer: Self-pay | Admitting: Family Medicine

## 2012-05-15 ENCOUNTER — Ambulatory Visit (INDEPENDENT_AMBULATORY_CARE_PROVIDER_SITE_OTHER): Payer: BC Managed Care – PPO | Admitting: Family Medicine

## 2012-05-15 VITALS — BP 130/90 | HR 84 | Temp 98.1°F | Wt 158.2 lb

## 2012-05-15 DIAGNOSIS — M25519 Pain in unspecified shoulder: Secondary | ICD-10-CM

## 2012-05-15 DIAGNOSIS — M25511 Pain in right shoulder: Secondary | ICD-10-CM

## 2012-05-15 NOTE — Patient Instructions (Signed)
No copay today. Pass by Mairon's office for referral to orthopedist.

## 2012-05-15 NOTE — Telephone Encounter (Signed)
i see pt has made appt for today for arm pain - but I do want to refer to ortho - plz let her know this referral is underway and doesn't need OV, unless pt desires to come in.

## 2012-05-15 NOTE — Telephone Encounter (Signed)
Unfortunately I didn't get this until after patient arrived, so I couldn't cancel her appt. I'm sorry!

## 2012-05-15 NOTE — Progress Notes (Signed)
See prior note for details.  No change in pain med.  Steroid inj did not help.  Will refer to ortho.  Pt did not receive message so made appt today.  Did not need to come in.  No change in therapy.

## 2012-05-16 ENCOUNTER — Other Ambulatory Visit: Payer: Self-pay | Admitting: Dermatology

## 2012-05-22 ENCOUNTER — Telehealth: Payer: Self-pay

## 2012-05-22 NOTE — Telephone Encounter (Signed)
Pt left v/m requesting to speak with Dr Danise Mina CMA. Left v/m for pt to call back.

## 2012-05-23 ENCOUNTER — Emergency Department (HOSPITAL_COMMUNITY)
Admission: EM | Admit: 2012-05-23 | Discharge: 2012-05-23 | Disposition: A | Discharge status: Home / Self Care | Payer: BC Managed Care – PPO | Attending: Emergency Medicine | Admitting: Emergency Medicine

## 2012-05-23 ENCOUNTER — Encounter (HOSPITAL_COMMUNITY): Payer: Self-pay | Admitting: Emergency Medicine

## 2012-05-23 ENCOUNTER — Other Ambulatory Visit: Payer: Self-pay | Admitting: Orthopedic Surgery

## 2012-05-23 ENCOUNTER — Ambulatory Visit
Admission: RE | Admit: 2012-05-23 | Discharge: 2012-05-23 | Disposition: A | Discharge status: Home / Self Care | Payer: BC Managed Care – PPO | Source: Ambulatory Visit | Attending: Orthopedic Surgery | Admitting: Orthopedic Surgery

## 2012-05-23 DIAGNOSIS — Z8673 Personal history of transient ischemic attack (TIA), and cerebral infarction without residual deficits: Secondary | ICD-10-CM | POA: Insufficient documentation

## 2012-05-23 DIAGNOSIS — Z8041 Family history of malignant neoplasm of ovary: Secondary | ICD-10-CM | POA: Insufficient documentation

## 2012-05-23 DIAGNOSIS — F341 Dysthymic disorder: Secondary | ICD-10-CM | POA: Insufficient documentation

## 2012-05-23 DIAGNOSIS — M25519 Pain in unspecified shoulder: Secondary | ICD-10-CM

## 2012-05-23 DIAGNOSIS — Z885 Allergy status to narcotic agent status: Secondary | ICD-10-CM | POA: Insufficient documentation

## 2012-05-23 DIAGNOSIS — M25511 Pain in right shoulder: Secondary | ICD-10-CM

## 2012-05-23 DIAGNOSIS — Z8541 Personal history of malignant neoplasm of cervix uteri: Secondary | ICD-10-CM | POA: Insufficient documentation

## 2012-05-23 DIAGNOSIS — Z8489 Family history of other specified conditions: Secondary | ICD-10-CM | POA: Insufficient documentation

## 2012-05-23 DIAGNOSIS — Z888 Allergy status to other drugs, medicaments and biological substances status: Secondary | ICD-10-CM | POA: Insufficient documentation

## 2012-05-23 DIAGNOSIS — Z823 Family history of stroke: Secondary | ICD-10-CM | POA: Insufficient documentation

## 2012-05-23 DIAGNOSIS — Z8249 Family history of ischemic heart disease and other diseases of the circulatory system: Secondary | ICD-10-CM | POA: Insufficient documentation

## 2012-05-23 DIAGNOSIS — I10 Essential (primary) hypertension: Secondary | ICD-10-CM | POA: Insufficient documentation

## 2012-05-23 DIAGNOSIS — F17200 Nicotine dependence, unspecified, uncomplicated: Secondary | ICD-10-CM | POA: Insufficient documentation

## 2012-05-23 DIAGNOSIS — Z833 Family history of diabetes mellitus: Secondary | ICD-10-CM | POA: Insufficient documentation

## 2012-05-23 MED ORDER — HYDROCODONE-ACETAMINOPHEN 5-325 MG PO TABS
1.0000 | ORAL_TABLET | Freq: Once | ORAL | Status: AC
  Administered 2012-05-23: 1 via ORAL
  Filled 2012-05-23: qty 1

## 2012-05-23 MED ORDER — HYDROCODONE-ACETAMINOPHEN 5-325 MG PO TABS: 1.0000 | ORAL_TABLET | ORAL | Status: DC | PRN

## 2012-05-23 MED ORDER — ONDANSETRON 4 MG PO TBDP
8.0000 mg | ORAL_TABLET | Freq: Once | ORAL | Status: AC
  Administered 2012-05-23: 8 mg via ORAL
  Filled 2012-05-23: qty 2

## 2012-05-23 NOTE — ED Provider Notes (Signed)
History     CSN: [id number]  Arrival date & time 05/23/12  2011   First MD Initiated Contact with Patient 05/23/12 2248      Chief Complaint  Patient presents with  . Shoulder Pain   HPI  History provided by the patient. Patient is a 56 year old female who presents with complaints of relief right shoulder pain. Patient reports having shoulder pains for the past several months that are currently being worked up by her primary care provider and orthopedic specialist, Dr. Tamera Punt. Patient states she was scheduled for an MRI today and while trying to perform an MRI she could not position her arm and shoulder to have an adequate evaluation. She was told by the radiology staff to inform her doctor so she may have treatment for pain and be able to perform the evaluation. Patient attempted a call Dr. Bettina Gavia office and spoke with one of the physician assistants who was unable to prescribe any pain medications over the phone and recommended patient come to urgent care Center or emergency room. Patient has rescheduled MRI for tomorrow and is requesting treatment for her pain symptoms. Pain is worse with any movements. She denies any deformities, skin changes to the shoulder. She denies any neck pains or fever chills and sweats.   Past Medical History  Diagnosis Date  . HTN (hypertension)   . Migraines     and frequent other headaches (sinus or stress)  . Crohn's disease     h/o stenotic crohn's, active in TI, PPD neg ([facility] Dr. [name]); started cimzia, ?failure given flare despite treatment, consdering remicade  . Cervical cancer 12/2008    s/p hysterectomy  . History of chicken pox   . Anxiety and depression   . History of anemia     attributed to crohn's  . Arthritis   . Genital warts   . History of syphilis 1980s  . Smoker   . TIA (transient ischemic attack) 05/2010    at [facility] - TIA vs complex migraine (w/u negative - carotids, echo, TC doppler, and MRI normal)  . Hepatic  steatosis 04/2011    diffuse on CT, 74m gallbladder polyp  . B12 deficiency     pernicious anemia - monthly b12 shots  . Scalp psoriasis     Past Surgical History  Procedure Date  . Appendectomy 1998  . Right colectomy 2000    crohn's disease, ileocecal resection  . Hospitalization 05/2010    TIA vs complex migraine - w/u negative - head CT, MRI/MRA, carotid dopplers, echo.  treated with ASA and tramadol  . [place] 05/[place]    nl LV fxn ,EF 60%  . [place]    diffuse hepatic steatosis, 545mpolyp in gallbaldder fundus, no stones, mod abd ath  . Colonoscopy 10/2008    ileo-colonic anastomosis, ielocolonic crohn's  . Colonoscopy 05/2011    ileo-colonic anastomosis normal, normal mucosa  . Esophagogastroduodenoscopy 05/2011    nl esophagus, gastritis, nl duodenum  . Cervical biopsy  w/ loop electrode excision 10/2008  . Vaginal hysterectomy 12/2008    LAVH/BSO    Family History  Problem Relation Age of Onset  . Crohn's disease Daughter     crohn's, ulcerative colitis  . Ulcerative colitis Daughter   . Stroke Mother   . Hypertension Mother   . Hyperlipidemia Mother   . Hypertension Father   . Crohn's disease Father   . Crohn's disease Sister   . Cancer Sister     ovarian  .  Crohn's disease Paternal Aunt   . Crohn's disease Paternal Uncle   . Diabetes Neg Hx     History  Substance Use Topics  . Smoking status: Current Every Day Smoker -- 0.5 packs/day for 10 years    Types: Cigarettes  . Smokeless tobacco: Never Used  . Alcohol Use: No     remote EtOh use    OB History    Grav Para Term Preterm Abortions TAB SAB Ect Mult Living   4 3   1  1   3       Review of Systems  Constitutional: Negative for fever and chills.  HENT: Negative for neck pain.   Musculoskeletal: Negative for back pain.       Right shoulder pain  Skin: Negative for rash.    Allergies  Codeine; Ibuprofen; and Remeron  Home Medications   Current Outpatient Rx  Name Route  Sig Dispense Refill  . ACETAMINOPHEN 500 MG PO TABS Oral Take 1,000 mg by mouth every 6 (six) hours as needed. For pain    . AMITRIPTYLINE HCL 25 MG PO TABS Oral Take 25 mg by mouth at bedtime.     Marland Kitchen CITALOPRAM HYDROBROMIDE 40 MG PO TABS  TAKE 1 TABLET BY MOUTH DAILY 30 tablet 11  . CYANOCOBALAMIN 1000 MCG/ML IJ SOLN Intramuscular Inject 1,000 mcg into the muscle every 30 (thirty) days. Last dose 01/13/2012.    . INFLIXIMAB 100 MG IV SOLR Intravenous Inject 100 mg into the vein every 6 (six) weeks.    Marland Kitchen LORAZEPAM 1 MG PO TABS Oral Take 1 tablet (1 mg total) by mouth 3 (three) times daily as needed. For anxiety. 90 tablet 3  . METOPROLOL SUCCINATE ER 50 MG PO TB24 Oral Take 1 tablet (50 mg total) by mouth daily. 30 tablet 11  . OMEPRAZOLE 40 MG PO CPDR Oral Take 40 mg by mouth 2 (two) times daily.      Marland Kitchen VALACYCLOVIR HCL 500 MG PO TABS  TAKE 1 TABLET BY MOUTH EVERY DAY 30 tablet 6  . ZOLPIDEM TARTRATE 5 MG PO TABS Oral Take 1 tablet (5 mg total) by mouth at bedtime as needed for sleep (insomnia). 30 tablet 0    BP 130/84  Pulse 87  Temp 98.2 F (36.8 C) (Oral)  Resp 20  SpO2 96%  Physical Exam  Nursing note and vitals reviewed. Constitutional: She is oriented to person, place, and time. She appears well-developed and well-nourished. No distress.  HENT:  Head: Normocephalic.  Cardiovascular: Normal rate and regular rhythm.   Pulmonary/Chest: Effort normal and breath sounds normal. No respiratory distress. She has no rales.  Musculoskeletal:       Reduced range of motion of right shoulder secondary to pain. There is diffuse tenderness over the shoulder especially in the anterior portion. No gross deformities. Normal skin color. Normal distal radial pulses and grip strength in hand. No change in sensation in fingers and hand.  Neurological: She is alert and oriented to person, place, and time.  Skin: Skin is warm and dry. No rash noted. No erythema.  Psychiatric: She has a normal mood and  affect. Her behavior is normal.    ED Course  Procedures      1. Right shoulder pain       MDM  Patient seen and evaluated. Patient appears in moderate discomfort. Patient currently being followed for same symptoms without change. She has rescheduled MRI for tomorrow. Will give prescription for Norco.  Anastasio Champion, [place] 05/23/12 [phone]

## 2012-05-23 NOTE — Progress Notes (Signed)
Orthopedic Tech Progress Note Patient Details:  [name] 12-23-1955 [id number]  Ortho Devices Type of Ortho Device: Sling immobilizer   Katheren Shams 05/23/2012, 11:29 PM

## 2012-05-23 NOTE — ED Notes (Signed)
C/o R shoulder pain x 2 months.  States pain worse over the last week.  Scheduled for MRI tonight but states unable to do MRI due to severe pain.  Pt called PCP and was told to come to ED.

## 2012-05-23 NOTE — ED Notes (Signed)
Alert, NAD, calm, interactive, radial, ulnar and brachial pulse palpable, cap refill <2sec, c/o R shoulder pain with radiation down arm, CMS intact, decreased ROM d/t pain. Pending arrival of orthotech, meds given, pt using phone.

## 2012-05-24 ENCOUNTER — Ambulatory Visit
Admission: RE | Admit: 2012-05-24 | Discharge: 2012-05-24 | Disposition: A | Discharge status: Home / Self Care | Payer: BC Managed Care – PPO | Source: Ambulatory Visit | Attending: Orthopedic Surgery | Admitting: Orthopedic Surgery

## 2012-05-24 NOTE — ED Provider Notes (Signed)
Medical screening examination/treatment/procedure(s) were performed by non-physician practitioner and as supervising physician I was immediately available for consultation/collaboration.   Blanchie Dessert, MD 05/24/12 [phone]

## 2012-05-24 NOTE — Telephone Encounter (Signed)
Spoke with patient. She was needing pain meds for arm to get through MRI, but she was able to get them from ortho.

## 2012-05-25 ENCOUNTER — Other Ambulatory Visit: Payer: Self-pay | Admitting: Orthopedic Surgery

## 2012-05-28 ENCOUNTER — Other Ambulatory Visit: Payer: Self-pay | Admitting: Family Medicine

## 2012-05-28 ENCOUNTER — Encounter (HOSPITAL_BASED_OUTPATIENT_CLINIC_OR_DEPARTMENT_OTHER): Payer: Self-pay | Admitting: *Deleted

## 2012-05-28 NOTE — Telephone Encounter (Signed)
plz phone in. 

## 2012-05-28 NOTE — Telephone Encounter (Signed)
Received refill request electronically from pharmacy. Last office visit 05/15/12; acute. Is it okay to refill medication?

## 2012-05-28 NOTE — Telephone Encounter (Signed)
Rx called to pharmacy as instructed. 

## 2012-05-29 ENCOUNTER — Encounter (HOSPITAL_BASED_OUTPATIENT_CLINIC_OR_DEPARTMENT_OTHER)
Admission: RE | Admit: 2012-05-29 | Discharge: 2012-05-29 | Disposition: A | Discharge status: Home / Self Care | Payer: BC Managed Care – PPO | Source: Ambulatory Visit | Attending: Orthopedic Surgery | Admitting: Orthopedic Surgery

## 2012-05-29 LAB — BASIC METABOLIC PANEL
CO2: 28 mEq/L (ref 19–32)
Calcium: 9.3 mg/dL (ref 8.4–10.5)
GFR calc non Af Amer: >90 mL/min (ref >90)
Potassium: 4.1 mEq/L (ref 3.5–5.1)
Sodium: 138 mEq/L (ref 135–145)

## 2012-05-31 ENCOUNTER — Encounter (HOSPITAL_BASED_OUTPATIENT_CLINIC_OR_DEPARTMENT_OTHER): Payer: Self-pay | Admitting: *Deleted

## 2012-05-31 ENCOUNTER — Encounter (HOSPITAL_BASED_OUTPATIENT_CLINIC_OR_DEPARTMENT_OTHER)
Admission: RE | Disposition: A | Discharge status: Home / Self Care | Payer: Self-pay | Source: Ambulatory Visit | Attending: Orthopedic Surgery

## 2012-05-31 ENCOUNTER — Ambulatory Visit (HOSPITAL_BASED_OUTPATIENT_CLINIC_OR_DEPARTMENT_OTHER): Payer: BC Managed Care – PPO | Admitting: Anesthesiology

## 2012-05-31 ENCOUNTER — Encounter (HOSPITAL_BASED_OUTPATIENT_CLINIC_OR_DEPARTMENT_OTHER): Payer: Self-pay | Admitting: Anesthesiology

## 2012-05-31 ENCOUNTER — Ambulatory Visit (HOSPITAL_BASED_OUTPATIENT_CLINIC_OR_DEPARTMENT_OTHER)
Admission: RE | Admit: 2012-05-31 | Discharge: 2012-05-31 | Disposition: A | Discharge status: Home / Self Care | Payer: BC Managed Care – PPO | Source: Ambulatory Visit | Attending: Orthopedic Surgery | Admitting: Orthopedic Surgery

## 2012-05-31 DIAGNOSIS — M67919 Unspecified disorder of synovium and tendon, unspecified shoulder: Secondary | ICD-10-CM | POA: Insufficient documentation

## 2012-05-31 DIAGNOSIS — M719 Bursopathy, unspecified: Secondary | ICD-10-CM | POA: Insufficient documentation

## 2012-05-31 DIAGNOSIS — Z9889 Other specified postprocedural states: Secondary | ICD-10-CM

## 2012-05-31 DIAGNOSIS — I10 Essential (primary) hypertension: Secondary | ICD-10-CM | POA: Insufficient documentation

## 2012-05-31 HISTORY — PX: SHOULDER ARTHROSCOPY W/ ROTATOR CUFF REPAIR: SHX2400

## 2012-05-31 SURGERY — ARTHROSCOPY, SHOULDER, WITH ROTATOR CUFF REPAIR
Anesthesia: General | Site: Shoulder | Laterality: Right | Wound class: Clean

## 2012-05-31 MED ORDER — OXYCODONE HCL 5 MG/5ML PO SOLN: 5.0000 mg | Freq: Once | ORAL | Status: DC | PRN

## 2012-05-31 MED ORDER — ONDANSETRON HCL 4 MG/2ML IJ SOLN
INTRAMUSCULAR | Status: DC | PRN
  Administered 2012-05-31: 4 mg via INTRAVENOUS

## 2012-05-31 MED ORDER — SODIUM CHLORIDE 0.9 % IR SOLN
Status: DC | PRN
  Administered 2012-05-31: 9000 mL

## 2012-05-31 MED ORDER — BUPIVACAINE-EPINEPHRINE PF 0.5-1:200000 % IJ SOLN
INTRAMUSCULAR | Status: DC | PRN
  Administered 2012-05-31: 25 mL

## 2012-05-31 MED ORDER — HYDROMORPHONE HCL PF 1 MG/ML IJ SOLN
0.2500 mg | INTRAMUSCULAR | Status: DC | PRN
  Administered 2012-05-31: 0.5 mg via INTRAVENOUS

## 2012-05-31 MED ORDER — FENTANYL CITRATE 0.05 MG/ML IJ SOLN
50.0000 ug | INTRAMUSCULAR | Status: DC | PRN
  Administered 2012-05-31: 100 ug via INTRAVENOUS

## 2012-05-31 MED ORDER — CEFAZOLIN SODIUM-DEXTROSE 2-3 GM-% IV SOLR
2.0000 g | INTRAVENOUS | Status: AC
  Administered 2012-05-31: 2 g via INTRAVENOUS

## 2012-05-31 MED ORDER — POVIDONE-IODINE 7.5 % EX SOLN: Freq: Once | CUTANEOUS | Status: DC

## 2012-05-31 MED ORDER — MIDAZOLAM HCL 2 MG/2ML IJ SOLN
1.0000 mg | INTRAMUSCULAR | Status: DC | PRN
  Administered 2012-05-31: 2 mg via INTRAVENOUS

## 2012-05-31 MED ORDER — LACTATED RINGERS IV SOLN
INTRAVENOUS | Status: DC
  Administered 2012-05-31 (×2): via INTRAVENOUS

## 2012-05-31 MED ORDER — PROPOFOL 10 MG/ML IV BOLUS
INTRAVENOUS | Status: DC | PRN
  Administered 2012-05-31: 130 mg via INTRAVENOUS

## 2012-05-31 MED ORDER — EPHEDRINE SULFATE 50 MG/ML IJ SOLN
INTRAMUSCULAR | Status: DC | PRN
  Administered 2012-05-31 (×3): 10 mg via INTRAVENOUS

## 2012-05-31 MED ORDER — OXYCODONE HCL 5 MG PO TABS: 5.0000 mg | ORAL_TABLET | Freq: Once | ORAL | Status: DC | PRN

## 2012-05-31 MED ORDER — DEXAMETHASONE SODIUM PHOSPHATE 4 MG/ML IJ SOLN
INTRAMUSCULAR | Status: DC | PRN
  Administered 2012-05-31: 8 mg via INTRAVENOUS

## 2012-05-31 MED ORDER — LIDOCAINE HCL (CARDIAC) 20 MG/ML IV SOLN
INTRAVENOUS | Status: DC | PRN
  Administered 2012-05-31: 30 mg via INTRAVENOUS

## 2012-05-31 MED ORDER — OXYCODONE-ACETAMINOPHEN 5-325 MG PO TABS: 1.0000 | ORAL_TABLET | ORAL | Status: DC | PRN

## 2012-05-31 MED ORDER — ONDANSETRON HCL 4 MG/2ML IJ SOLN: 4.0000 mg | Freq: Once | INTRAMUSCULAR | Status: DC | PRN

## 2012-05-31 MED ORDER — SUCCINYLCHOLINE CHLORIDE 20 MG/ML IJ SOLN
INTRAMUSCULAR | Status: DC | PRN
  Administered 2012-05-31: 100 mg via INTRAVENOUS

## 2012-05-31 SURGICAL SUPPLY — 97 items
ADH SKN CLS APL DERMABOND .7 (GAUZE/BANDAGES/DRESSINGS)
ANCH SUT 2 FT CRKSW 14.7X5.5 (Anchor) ×1 IMPLANT
ANCH SUT PUSHLCK 24X4.5 STRL (Orthopedic Implant) ×1 IMPLANT
ANCHOR CORKSCREW FIBER 5.5X15 (Anchor) ×1 IMPLANT
APL SKNCLS STERI-STRIP NONHPOA (GAUZE/BANDAGES/DRESSINGS)
BENZOIN TINCTURE PRP APPL 2/3 (GAUZE/BANDAGES/DRESSINGS) IMPLANT
BLADE 4.2CUDA (BLADE) IMPLANT
BLADE CUDA 5.5 (BLADE) IMPLANT
BLADE CUTTER GATOR 3.5 (BLADE) IMPLANT
BLADE GREAT WHITE 4.2 (BLADE) IMPLANT
BLADE SURG 15 STRL LF DISP TIS (BLADE) IMPLANT
BLADE SURG 15 STRL SS (BLADE)
BLADE SURG ROTATE 9660 (MISCELLANEOUS) IMPLANT
BUR 3.5 LG SPHERICAL (BURR) IMPLANT
BUR OVAL 4.0 (BURR) ×2 IMPLANT
BUR OVAL 6.0 (BURR) IMPLANT
BURR 3.5 LG SPHERICAL (BURR)
CANISTER OMNI JUG 16 LITER (MISCELLANEOUS) ×2 IMPLANT
CANISTER SUCTION 2500CC (MISCELLANEOUS) IMPLANT
CANNULA 5.75X71 LONG (CANNULA) ×2 IMPLANT
CANNULA TWIST IN 8.25X7CM (CANNULA) ×1 IMPLANT
CHLORAPREP W/TINT 26ML (MISCELLANEOUS) ×2 IMPLANT
CLOTH BEACON ORANGE TIMEOUT ST (SAFETY) ×2 IMPLANT
DECANTER SPIKE VIAL GLASS SM (MISCELLANEOUS) IMPLANT
DERMABOND ADVANCED (GAUZE/BANDAGES/DRESSINGS)
DERMABOND ADVANCED .7 DNX12 (GAUZE/BANDAGES/DRESSINGS) IMPLANT
DRAPE INCISE IOBAN 66X45 STRL (DRAPES) ×2 IMPLANT
DRAPE STERI 35X30 U-POUCH (DRAPES) ×2 IMPLANT
DRAPE SURG 17X23 STRL (DRAPES) ×2 IMPLANT
DRAPE U 20/CS (DRAPES) ×2 IMPLANT
DRAPE U-SHAPE 47X51 STRL (DRAPES) ×2 IMPLANT
DRAPE U-SHAPE 76X120 STRL (DRAPES) ×4 IMPLANT
DRSG PAD ABDOMINAL 8X10 ST (GAUZE/BANDAGES/DRESSINGS) ×2 IMPLANT
ELECT REM PT RETURN 9FT ADLT (ELECTROSURGICAL)
ELECTRODE REM PT RTRN 9FT ADLT (ELECTROSURGICAL) ×1 IMPLANT
GAUZE SPONGE 4X4 16PLY XRAY LF (GAUZE/BANDAGES/DRESSINGS) IMPLANT
GAUZE XEROFORM 1X8 LF (GAUZE/BANDAGES/DRESSINGS) ×2 IMPLANT
GLOVE BIO SURGEON STRL SZ 6.5 (GLOVE) ×1 IMPLANT
GLOVE BIO SURGEON STRL SZ7 (GLOVE) ×1 IMPLANT
GLOVE BIO SURGEON STRL SZ7.5 (GLOVE) ×4 IMPLANT
GLOVE BIOGEL PI IND STRL 7.0 (GLOVE) IMPLANT
GLOVE BIOGEL PI IND STRL 8 (GLOVE) ×3 IMPLANT
GLOVE BIOGEL PI INDICATOR 7.0 (GLOVE) ×2
GLOVE BIOGEL PI INDICATOR 8 (GLOVE) ×1
GOWN BRE IMP PREV XXLGXLNG (GOWN DISPOSABLE) ×1 IMPLANT
GOWN PREVENTION PLUS XLARGE (GOWN DISPOSABLE) ×5 IMPLANT
NDL 1/2 CIR CATGUT .05X1.09 (NEEDLE) IMPLANT
NDL SCORPION MULTI FIRE (NEEDLE) IMPLANT
NDL SUT 6 .5 CRC .975X.05 MAYO (NEEDLE) IMPLANT
NEEDLE 1/2 CIR CATGUT .05X1.09 (NEEDLE) IMPLANT
NEEDLE MAYO TAPER (NEEDLE)
NEEDLE SCORPION MULTI FIRE (NEEDLE) ×2 IMPLANT
NS IRRIG 1000ML POUR BTL (IV SOLUTION) IMPLANT
PACK ARTHROSCOPY DSU (CUSTOM PROCEDURE TRAY) ×2 IMPLANT
PACK BASIN DAY SURGERY FS (CUSTOM PROCEDURE TRAY) ×2 IMPLANT
PENCIL BUTTON HOLSTER BLD 10FT (ELECTRODE) IMPLANT
PUSHLOCK PEEK 4.5X24 (Orthopedic Implant) ×1 IMPLANT
RESECTOR FULL RADIUS 4.2MM (BLADE) ×2 IMPLANT
RESECTOR FULL RADIUS 4.8MM (BLADE) IMPLANT
SHEET MEDIUM DRAPE 40X70 STRL (DRAPES) ×2 IMPLANT
SLEEVE SCD COMPRESS KNEE MED (MISCELLANEOUS) ×2 IMPLANT
SLING ARM FOAM STRAP LRG (SOFTGOODS) IMPLANT
SLING ARM FOAM STRAP MED (SOFTGOODS) IMPLANT
SLING ARM FOAM STRAP XLG (SOFTGOODS) IMPLANT
SLING ARM IMMOBILIZER LRG (SOFTGOODS) ×1 IMPLANT
SLING ARM IMMOBILIZER MED (SOFTGOODS) IMPLANT
SPONGE GAUZE 4X4 12PLY (GAUZE/BANDAGES/DRESSINGS) ×2 IMPLANT
SPONGE LAP 4X18 X RAY DECT (DISPOSABLE) IMPLANT
STRIP CLOSURE SKIN 1/2X4 (GAUZE/BANDAGES/DRESSINGS) IMPLANT
SUCTION FRAZIER TIP 10 FR DISP (SUCTIONS) IMPLANT
SUPPORT WRAP ARM LG (MISCELLANEOUS) ×1 IMPLANT
SUT 2 FIBERLOOP 20 STRT BLUE (SUTURE)
SUT BONE WAX W31G (SUTURE) IMPLANT
SUT ETHIBOND 2 OS 4 DA (SUTURE) IMPLANT
SUT ETHILON 3 0 PS 1 (SUTURE) ×1 IMPLANT
SUT ETHILON 4 0 PS 2 18 (SUTURE) ×2 IMPLANT
SUT FIBERWIRE #2 38 T-5 BLUE (SUTURE)
SUT FIBERWIRE 2-0 18 17.9 3/8 (SUTURE)
SUT MNCRL AB 3-0 PS2 18 (SUTURE) IMPLANT
SUT MNCRL AB 4-0 PS2 18 (SUTURE) IMPLANT
SUT PDS AB 0 CT 36 (SUTURE) IMPLANT
SUT PROLENE 3 0 PS 2 (SUTURE) ×1 IMPLANT
SUT VIC AB 0 CT1 18XCR BRD 8 (SUTURE) IMPLANT
SUT VIC AB 0 CT1 8-18 (SUTURE)
SUT VIC AB 2-0 SH 18 (SUTURE) IMPLANT
SUTURE 2 FIBERLOOP 20 STRT BLU (SUTURE) IMPLANT
SUTURE FIBERWR #2 38 T-5 BLUE (SUTURE) IMPLANT
SUTURE FIBERWR 2-0 18 17.9 3/8 (SUTURE) IMPLANT
SYR BULB 3OZ (MISCELLANEOUS) IMPLANT
TOWEL OR 17X24 6PK STRL BLUE (TOWEL DISPOSABLE) ×2 IMPLANT
TOWEL OR NON WOVEN STRL DISP B (DISPOSABLE) ×2 IMPLANT
TUBE CONNECTING 20X1/4 (TUBING) ×2 IMPLANT
TUBING ARTHROSCOPY IRRIG 16FT (MISCELLANEOUS) ×2 IMPLANT
VAC PUDDLE GUPPY (MISCELLANEOUS) ×1 IMPLANT
WAND STAR VAC 90 (SURGICAL WAND) ×2 IMPLANT
WATER STERILE IRR 1000ML POUR (IV SOLUTION) IMPLANT
YANKAUER SUCT BULB TIP NO VENT (SUCTIONS) IMPLANT

## 2012-05-31 NOTE — Transfer of Care (Signed)
Immediate Anesthesia Transfer of Care Note  Patient: Sharon [name]  Procedure(s) Performed: Procedure(s) (LRB) with comments: SHOULDER ARTHROSCOPY WITH ROTATOR CUFF REPAIR (Right) - right shoulder arthroscopy with rotator cuff repair and debridement and acromioplasty   Patient Location: PACU  Anesthesia Type: GA combined with regional for post-op pain  Level of Consciousness: awake and alert   Airway & Oxygen Therapy: Patient Spontanous Breathing and Patient connected to face mask oxygen  Post-op Assessment: Report given to PACU RN and Post -op Vital signs reviewed and stable  Post vital signs: Reviewed and stable  Complications: No apparent anesthesia complications

## 2012-05-31 NOTE — Op Note (Signed)
Procedure(s): SHOULDER ARTHROSCOPY WITH ROTATOR CUFF REPAIR Procedure Note  Sharon [name] female 56 y.o. 05/31/2012  Procedure(s) and Anesthesia Type:    * RIGHT SHOULDER ARTHROSCOPY WITH ROTATOR CUFF REPAIR AND SUBACROMIAL DECOMPRESSION, with debridement of partial-thickness upper border subscapularis tear through intra-articular portal- General with preoperative interscalene block  Surgeon(s) and Role:    * Nita Sells, MD - Primary     Surgeon: Nita Sells   Assistants: Jeanmarie Hubert PA-C (Danielle was scrubbed throughout the procedure and was essential in positioning and holding the camera as well as closure)  Anesthesia: General endotracheal anesthesia with preoperative interscalene block    Procedure Detail  SHOULDER ARTHROSCOPY WITH ROTATOR CUFF REPAIR  Estimated Blood Loss: Min         Drains: none  Blood Given: none         Specimens: none        Complications:  * No complications entered in OR log *         Disposition: PACU - hemodynamically stable.         Condition: stable    Procedure:   INDICATIONS FOR SURGERY: The patient is 56 y.o. female who has had greater than a six-month history of right shoulder pain which got worse after a fall about 2 months ago. She is been treated conservatively with injections exercises and topical anti-inflammatories without relief. She had an MRI which showed near full-thickness tear of the supraspinatus tendon. She was indicated for operative treatment to decrease pain restore function. She understood risks benefits alternatives to the procedure including but not limited to the risk of bleeding, infection, damage to neurovascular structures, stiffness, nonhealing, and the potential need for future surgery.  OPERATIVE FINDINGS: Examination under anesthesia: No stiffness or instability. Diagnostic Arthroscopy:  Glenoid articular cartilage: Intact Humeral head articular cartilage:  Intact Labrum: Intact, long head biceps tendon intact Loose bodies: None Synovitis: Minimal Articular sided rotator cuff: 90% high-grade partial tear with 90% of the tuberosity exposed, involving the anterior supraspinatus extending from just posterior to the biceps tendon approximately 1 1/2 cm. She also had partial tearing of the upper border of the subscapularis which was debrided but was not felt necessary for repair. Bursal sided rotator cuff: Intact but thinned Coracoacromial ligament: Frayed, large underlying anterior subacromial spur  DESCRIPTION OF PROCEDURE: The patient was identified in preoperative  holding area where I personally marked the operative site after  verifying site, side, and procedure with the patient. An interscalene block was given by the attending anesthesiologist the holding area.  The patient was taken back to the operating room where general anesthesia was induced without complication and was placed in the beach-chair position with the back  elevated about 60 degrees and all extremities and head and neck carefully padded and  positioned.   The right upper extremity was then prepped and  draped in a standard sterile fashion. The appropriate time-out  procedure was carried out. The patient did receive IV antibiotics  within 30 minutes of incision.   A small posterior portal incision was made and the arthroscope was introduced into the joint. An anterior portal was then established above the subscapularis using needle localization. Small cannula was placed anteriorly. Diagnostic arthroscopy was then carried out with findings as described above.  The subscapularis was probed. There was some partial thickness tearing of a upper border which did not involve more than about 20% of the thickness of the tendon. This was debrided back to healthy appearing tendon.  The remainder the tendon was completely intact and a formal repair was not felt to be necessary. The biceps  tendon was intact. The remainder the joint looked good as described above in findings. Unfortunately she had a  significant high grade partial tear of the articular surface of the supraspinatus with 90% of the greater tuberosity in the footprint of the supraspinatus exposed. There was small amount of tendon still laterally. I used the shaver through the anterior intra-articular portal to debride the undersurface of the supraspinatus and the bare tuberosity. A spinal needle was used percutaneously with a PDS suture to tag the area of concern.  The arthroscope was then introduced into the subacromial space a standard lateral portal was established with needle localization. The shaver was used through the lateral portal to perform extensive bursectomy, she was noted to have extensive hypertrophied bursitis.. Coracoacromial ligament was examined and found to be significantly frayed indicating chronic impingement. The subacromial space was noted to be extremely tight..  The previously placed PDS suture was found on the anterolateral rotator cuff. The cuff in this area was probed and found to be extremely thin. An 11 blade was used through the lateral portal to complete a high grade partial tear for a length of about 1/2 cm. A posterior lateral portal was then established reviewing and the camera was placed here. The shaver and ArthroCare were used to debride the tuberosity and the tendon edge back to healthy tendon. The bur was then used to create a bleeding surface on the tuberosity. A 5.5 mm peak corkscrew anchor was then placed percutaneously off the lateral edge of the acromion just of the the articular margin in the central position of the tear. The sutures were then each passed using a scorpion suture passer in a horizontal mattress configuration through healthy tendon medially. These were each tied down bringing the tendon back down to the prepared tuberosity. Each strand was then placed and one 4.5 mm push  lock anchor which was placed in a lateral row configuration bringing the tendon edge nicely down over the prepared tuberosity. The final repair was viewed from posterior lateral and lateral portals and felt to be excellent.   The coracoacromial ligament was taken down off the anterior acromion with the ArthroCare exposing a large hooked  anterior acromial spur. A high-speed bur was then used through the lateral portal to take down the anterior acromial spur from lateral to medial in a standard acromioplasty.  The acromioplasty was also viewed from the lateral portal and the bur was used as necessary to ensure that the acromion was completely flat from posterior to anterior.  The arthroscopic equipment was removed from the joint and the portals were closed with 3-0 nylon in an interrupted fashion. Sterile dressings were then applied including Xeroform 4 x 4's ABDs and tape. The patient was then allowed to awaken from general anesthesia, placed in a sling, transferred to the stretcher and taken to the recovery room in stable condition.   POSTOPERATIVE PLAN: The patient will be discharged home today and will followup in one week for suture removal and wound check. She will follow the standard cuff protocol.

## 2012-05-31 NOTE — Anesthesia Procedure Notes (Addendum)
Procedure Name: Intubation Performed by: Tawni Millers Pre-anesthesia Checklist: Patient identified, Timeout performed, Emergency Drugs available, Suction available and Patient being monitored Patient Re-evaluated:Patient Re-evaluated prior to inductionOxygen Delivery Method: Circle system utilized Preoxygenation: Pre-oxygenation with 100% oxygen Intubation Type: IV induction Ventilation: Mask ventilation without difficulty Laryngoscope Size: Mac and 3 Grade View: Grade II Tube type: Oral Tube size: 7.0 mm Number of attempts: 1 Airway Equipment and Method: Rigid stylet and LTA kit utilized Placement Confirmation: ETT inserted through vocal cords under direct vision,  breath sounds checked- equal and bilateral and positive ETCO2 Secured at: 18 cm Tube secured with: Tape Dental Injury: Teeth and Oropharynx as per pre-operative assessment    Anesthesia Regional Block:  Interscalene brachial plexus block  Pre-Anesthetic Checklist: ,, timeout performed, Correct Patient, Correct Site, Correct Laterality, Correct Procedure, Correct Position, site marked, Risks and benefits discussed,  Surgical consent,  Pre-op evaluation,  At surgeon's request and post-op pain management  Laterality: Right and Upper  Prep: chloraprep       Needles:  Injection technique: Single-shot  Needle Type: Echogenic Needle     Needle Length: 5cm 5 cm Needle Gauge: 21 and 21 G    Additional Needles:  Procedures: ultrasound guided Interscalene brachial plexus block Narrative:  Start time: 05/31/2012 7:05 AM End time: 05/31/2012 7:17 AM Injection made incrementally with aspirations every 5 mL.  Performed by: Personally  Anesthesiologist: Lorrene Reid  Supraclavicular block

## 2012-05-31 NOTE — Anesthesia Preprocedure Evaluation (Signed)
Anesthesia Evaluation  Patient identified by MRN, date of birth, ID band Patient awake    Reviewed: Allergy & Precautions, H&P , NPO status , Patient's Chart, lab work & pertinent test results, reviewed documented beta blocker date and time   Airway Mallampati: II TM Distance: >3 FB Neck ROM: Full    Dental  (+) Teeth Intact and Dental Advisory Given   Pulmonary  breath sounds clear to auscultation        Cardiovascular hypertension, Rhythm:Regular Rate:Normal     Neuro/Psych    GI/Hepatic   Endo/Other    Renal/GU      Musculoskeletal   Abdominal   Peds  Hematology   Anesthesia Other Findings   Reproductive/Obstetrics                           Anesthesia Physical Anesthesia Plan  ASA: II  Anesthesia Plan: General   Post-op Pain Management:    Induction: Intravenous  Airway Management Planned: Oral ETT  Additional Equipment:   Intra-op Plan:   Post-operative Plan: Extubation in OR  Informed Consent: I have reviewed the patients History and Physical, chart, labs and discussed the procedure including the risks, benefits and alternatives for the proposed anesthesia with the patient or authorized representative who has indicated his/her understanding and acceptance.   Dental advisory given  Plan Discussed with: CRNA, Anesthesiologist and Surgeon  Anesthesia Plan Comments:         Anesthesia Quick Evaluation

## 2012-05-31 NOTE — H&P (Signed)
Sharon [name] is an 56 y.o. female.   Chief Complaint: R shoulder pain HPI: > 17moR shoulder pain with partial thickness RCT failed nonop tx with steroid injections, exercises and topical antiinflammatories.   Past Medical History  Diagnosis Date  . HTN (hypertension)   . Migraines     and frequent other headaches (sinus or stress)  . Crohn's disease     h/o stenotic crohn's, active in TI, PPD neg ([facility] Dr. EOletta [name]; started cimzia, ?failure given flare despite treatment, consdering remicade  . Cervical cancer 12/2008    s/p hysterectomy  . History of chicken pox   . Anxiety and depression   . History of anemia     attributed to crohn's  . Arthritis   . Genital warts   . History of syphilis 1980s  . Smoker   . [facility] (transient ischemic attack) [facility]2010    [facility] (w/u negative - carotids, echo, TC doppler, and MRI normal)  . Hepatic steatosis 04/2011    diffuse on CT, 543mgallbladder polyp  . B12 deficiency     pernicious anemia - monthly b12 shots  . Scalp psoriasis     Past Surgical History  Procedure Date  . Appendectomy 1998  . Right colectomy 2000    crohn's disease, ileocecal resection  . Hospitalization 05/2010    TIA vs complex migraine - w/u negative - head CT, MRI/MRA, carotid dopplers, echo.  treated with ASA and tramadol  . [place] 05/[place]    nl LV fxn ,EF 60%  . Abd [place]    diffuse hepatic steatosis, 34m36molyp in gallbaldder fundus, no stones, mod abd ath  . Colonoscopy 10/2008    ileo-colonic anastomosis, ielocolonic crohn's  . Colonoscopy 05/2011    ileo-colonic anastomosis normal, normal mucosa  . Esophagogastroduodenoscopy 05/2011    nl esophagus, gastritis, nl duodenum  . Cervical biopsy  w/ loop electrode excision 10/2008  . Vaginal hysterectomy 12/2008    LAVH/BSO    Family History  Problem Relation Age of Onset  . Crohn's disease Daughter     crohn's, ulcerative colitis  . Ulcerative colitis  Daughter   . Stroke Mother   . Hypertension Mother   . Hyperlipidemia Mother   . Hypertension Father   . Crohn's disease Father   . Crohn's disease Sister   . Cancer Sister     ovarian  . Crohn's disease Paternal Aunt   . Crohn's disease Paternal Uncle   . Diabetes Neg Hx    Social History:  reports that she has been smoking Cigarettes.  She has a 3.3 pack-year smoking history. She has never used smokeless tobacco. She reports that she does not drink alcohol or use illicit drugs.  Allergies:  Allergies  Allergen Reactions  . Codeine Nausea And Vomiting  . Ibuprofen Other (See Comments)    Pt has crohn's disease  . Remeron (Mirtazapine)     Knocked out    Medications Prior to Admission  Medication Sig Dispense Refill  . amitriptyline (ELAVIL) 25 MG tablet Take 25 mg by mouth at bedtime.       . citalopram (CELEXA) 40 MG tablet TAKE 1 TABLET BY MOUTH DAILY  30 tablet  11  . cyanocobalamin (,VITAMIN B-12,) 1000 MCG/ML injection Inject 1,000 mcg into the muscle every 30 (thirty) days. Last dose 01/13/2012.      . HMarland KitchenDROcodone-acetaminophen (NORCO) 5-325 MG per tablet Take 1-2 tablets by mouth every 4 (four)  hours as needed for pain.  20 tablet  0  . LORazepam (ATIVAN) 1 MG tablet TAKE 1 TABLET BY MOUTH 3 TIMES A DAY AS NEEDED FOR ANXIETY  90 tablet  0  . metoprolol succinate (TOPROL-XL) 50 MG 24 hr tablet Take 1 tablet (50 mg total) by mouth daily.  30 tablet  11  . omeprazole (PRILOSEC) 40 MG capsule Take 40 mg by mouth 2 (two) times daily.       . valACYclovir (VALTREX) 500 MG tablet TAKE 1 TABLET BY MOUTH EVERY DAY  30 tablet  6  . zolpidem (AMBIEN) 5 MG tablet Take 1 tablet (5 mg total) by mouth at bedtime as needed for sleep (insomnia).  30 tablet  0  . acetaminophen (TYLENOL) 500 MG tablet Take 1,000 mg by mouth every 6 (six) hours as needed. For pain      . inFLIXimab (REMICADE) 100 MG injection Inject 100 mg into the vein every 6 (six) weeks.        Results for orders placed  during the hospital encounter of 05/31/12 (from the past 48 hour(s))  BASIC METABOLIC PANEL     Status: Abnormal   Collection Time   05/29/12 10:30 AM      Component Value Range Comment   Sodium 138  135 - 145 mEq/L    Potassium 4.1  3.5 - 5.1 mEq/L    Chloride 100  96 - 112 mEq/L    CO2 28  19 - 32 mEq/L    Glucose, Bld 114 (*) 70 - 99 mg/dL    BUN 8  6 - 23 mg/dL    Creatinine, Ser 0.79  0.50 - 1.10 mg/dL    Calcium 9.3  8.4 - 10.5 mg/dL    GFR calc non Af Amer >90  >90 mL/min    GFR calc Af Amer >90  >90 mL/min    No results found.  Review of Systems  All other systems reviewed and are negative.    Blood pressure 109/55, pulse 63, temperature 98.1 F (36.7 C), temperature source Oral, resp. rate 16, height 5' 1"  (1.549 m), weight 72.122 kg (159 lb), SpO2 99.00%. Physical Exam  Constitutional: She is oriented to person, place, and time. She appears well-nourished.  HENT:  Head: Atraumatic.  Eyes: EOM are normal.  Cardiovascular: Intact distal pulses.   Respiratory: Effort normal.  Musculoskeletal:       Right shoulder: She exhibits decreased range of motion, tenderness and pain.  Neurological: She is alert and oriented to person, place, and time.  Skin: Skin is warm and dry.  Psychiatric: She has a normal mood and affect.     Assessment/Plan  > 85moR shoulder pain  with partial thickness RCT failed nonop tx with steroid injections, exercises and topical antiinflammatories.  Plan arthr debridement vs repair of RCT, acromioplasty Risks / benefits of surgery discussed Consent on chart  NPO for OR Preop antibiotics   Sharon [name] 05/31/2012, 7:15 AM

## 2012-05-31 NOTE — Anesthesia Postprocedure Evaluation (Signed)
  Anesthesia Post-op Note  Patient: Sharon [name]  Procedure(s) Performed: Procedure(s) (LRB) with comments: SHOULDER ARTHROSCOPY WITH ROTATOR CUFF REPAIR (Right) - right shoulder arthroscopy with arthroscopic rotator cuff repair and debridement and acromioplasty and distal clavicle resection  Patient Location: PACU  Anesthesia Type: GA combined with regional for post-op pain  Level of Consciousness: awake, alert  and oriented  Airway and Oxygen Therapy: Patient Spontanous Breathing and Patient connected to face mask oxygen  Post-op Pain: none  Post-op Assessment: Post-op Vital signs reviewed  Post-op Vital Signs: Reviewed  Complications: No apparent anesthesia complications

## 2012-05-31 NOTE — Progress Notes (Signed)
Assisted Dr. Crews with right, ultrasound guided, interscalene  block. Side rails up, monitors on throughout procedure. See vital signs in flow sheet. Tolerated Procedure well. 

## 2012-06-05 ENCOUNTER — Ambulatory Visit (INDEPENDENT_AMBULATORY_CARE_PROVIDER_SITE_OTHER): Payer: BC Managed Care – PPO | Admitting: Family Medicine

## 2012-06-05 ENCOUNTER — Encounter: Payer: Self-pay | Admitting: Family Medicine

## 2012-06-05 VITALS — BP 120/88 | HR 76 | Temp 98.1°F | Resp 20 | Ht 61.0 in | Wt 157.8 lb

## 2012-06-05 DIAGNOSIS — B370 Candidal stomatitis: Secondary | ICD-10-CM | POA: Insufficient documentation

## 2012-06-05 DIAGNOSIS — R319 Hematuria, unspecified: Secondary | ICD-10-CM

## 2012-06-05 DIAGNOSIS — R509 Fever, unspecified: Secondary | ICD-10-CM | POA: Insufficient documentation

## 2012-06-05 LAB — POCT URINALYSIS DIPSTICK
Bilirubin, UA: NEGATIVE
Ketones, UA: NEGATIVE
Spec Grav, UA: 1.015
pH, UA: 7.5

## 2012-06-05 MED ORDER — NYSTATIN 100000 UNIT/ML MT SUSP: 500000.0000 [IU] | Freq: Four times a day (QID) | OROMUCOSAL | Status: DC

## 2012-06-05 MED ORDER — AZITHROMYCIN 250 MG PO TABS: ORAL_TABLET | ORAL | Status: DC

## 2012-06-05 NOTE — Assessment & Plan Note (Signed)
With mild cough.   UA checked - unrevealing. No other focal findings. Wounds looking great, denies increased shoulder pain after surgery. Anticipate bronchitis, possibly viral - monitor for now.  If worsening, provided with script for zpack.  If fever continued despite this, recommended see ortho prior to Friday for wound check.  Pt agrees with plan.

## 2012-06-05 NOTE — Assessment & Plan Note (Signed)
Treat with nystatin swish and swallow.  could be 2/2 remicade, but doubt cauxing current fever.Marland Kitchen

## 2012-06-05 NOTE — Progress Notes (Signed)
Subjective:    Patient ID: Sharon [name], female    DOB: 06/21/1956, 56 y.o.   MRN: [id number]  HPI CC: fever  05/31/2012 - RIGHT SHOULDER ARTHROSCOPY WITH ROTATOR CUFF REPAIR AND SUBACROMIAL DECOMPRESSION, with debridement of partial-thickness upper border subscapularis tear Sharon [name])  Since surgery (6 days ago), running low grade fever and decreased appetite.  Feeling more nauseated as well.  Then yesterday and last night fever up to 101.5.  this am temp 100.6.  Taking pain medicine with tylenol.  Last night called on call physician for ortho, rec take pain med.  Was unable to get through this morning.  Next appt with Dr. Tamera [name] is Friday.  Incisions looking fine, no draining or redness or warmth.  Pain at shoulder getting better each day, not worsening.  Denies congestion, RN, ST, dysuria.  No back pain.  No PNDrainage.  + cough productive of brown sputum for last few days.  Present since surgery, but worsened recently.  Today not as bad.   + mild diarrhea for last 4 days, having abd pain.  + some vomiting recently, NBNB, food.  Past Medical History  Diagnosis Date  . HTN (hypertension)   . Migraines     and frequent other headaches (sinus or stress)  . Crohn's disease     h/o stenotic crohn's, active in TI, PPD neg ([facility] Dr. [name]); started cimzia, ?failure given flare despite treatment, consdering remicade  . Cervical cancer 12/2008    s/p hysterectomy  . History of chicken pox   . Anxiety and depression   . History of anemia     attributed to crohn's  . Arthritis   . Genital warts   . History of syphilis 1980s  . Smoker   . TIA (transient ischemic attack) 05/2010    at [facility] - TIA vs complex migraine (w/u negative - carotids, echo, TC doppler, and MRI normal)  . Hepatic steatosis 04/2011    diffuse on CT, 44m gallbladder polyp  . B12 deficiency     pernicious anemia - monthly b12 shots  . Scalp psoriasis      Review of Systems Per HPI    Objective:   Physical Exam  Nursing note and vitals reviewed. Constitutional: She appears well-developed and well-nourished. No distress.  HENT:  Head: Normocephalic and atraumatic.  Right Ear: Hearing, tympanic membrane, external ear and ear canal normal.  Left Ear: Hearing, tympanic membrane, external ear and ear canal normal.  Nose: No mucosal edema or rhinorrhea. Right sinus exhibits no maxillary sinus tenderness and no frontal sinus tenderness. Left sinus exhibits no maxillary sinus tenderness and no frontal sinus tenderness.  Mouth/Throat: Uvula is midline and oropharynx is clear and moist. No oropharyngeal exudate, posterior oropharyngeal edema, posterior oropharyngeal erythema or tonsillar abscesses.       White patches, not scrapable, on tongue  Eyes: Conjunctivae normal and EOM are normal. Pupils are equal, round, and reactive to light. No scleral icterus.  Neck: Normal range of motion. Neck supple.  Cardiovascular: Normal rate, regular rhythm, normal heart sounds and intact distal pulses.   No murmur heard. Pulmonary/Chest: Effort normal and breath sounds normal. No respiratory distress. She has no wheezes. She has no rales.       Slight coarse  Abdominal: Soft. She exhibits no distension and no mass. There is tenderness (mild diffusely (baseline)). There is no rebound and no guarding.  Musculoskeletal: She exhibits no edema.  Lymphadenopathy:    She has no cervical adenopathy.  Skin:  Incisions on shoulder x4 c/d/i.  2nd anterior incision with slight puffiness surrounding, but no erythema, tenderness, no draining.       Assessment & Plan:

## 2012-06-05 NOTE — Patient Instructions (Addendum)
Urine looking ok today. I'd like for Dr. Tamera Punt to see you tomorrow or the next day if fever continues. I wonder if this is bronchitis causing fever.  Take zpack script and fill of cough worsening or fever continues. Good to see you today, call us with questions. Use nystatin swish and swallow for thrush - use for 7-10 days.

## 2012-06-26 ENCOUNTER — Telehealth: Payer: Self-pay | Admitting: *Deleted

## 2012-06-26 ENCOUNTER — Telehealth: Payer: Self-pay | Admitting: Family Medicine

## 2012-06-26 MED ORDER — PANTOPRAZOLE SODIUM 40 MG PO TBEC: 40.0000 mg | DELAYED_RELEASE_TABLET | Freq: Every day | ORAL | Status: DC

## 2012-06-26 NOTE — Telephone Encounter (Signed)
plz notify that reviewing medications, omeprazole can interact with celexa (citalopram).  I'd like [place] to switch PPI to protonix - sent in.  Try 53m once daily and let me know how reflux sxs are.

## 2012-06-26 NOTE — Telephone Encounter (Signed)
Received prior auth request from pharmacy for Protonix, I called for and received paperwork. Will place in your  inbox.

## 2012-06-26 NOTE — Telephone Encounter (Signed)
Patient notified and will try new medication. She will call with update.

## 2012-06-27 ENCOUNTER — Encounter: Payer: Self-pay | Admitting: *Deleted

## 2012-06-27 ENCOUNTER — Ambulatory Visit (INDEPENDENT_AMBULATORY_CARE_PROVIDER_SITE_OTHER): Payer: BC Managed Care – PPO | Admitting: Family Medicine

## 2012-06-27 ENCOUNTER — Encounter: Payer: Self-pay | Admitting: Family Medicine

## 2012-06-27 VITALS — BP 132/76 | HR 84 | Temp 98.4°F | Wt 158.8 lb

## 2012-06-27 DIAGNOSIS — F419 Anxiety disorder, unspecified: Secondary | ICD-10-CM

## 2012-06-27 DIAGNOSIS — K219 Gastro-esophageal reflux disease without esophagitis: Secondary | ICD-10-CM | POA: Insufficient documentation

## 2012-06-27 DIAGNOSIS — F329 Major depressive disorder, single episode, unspecified: Secondary | ICD-10-CM

## 2012-06-27 DIAGNOSIS — F32A Depression, unspecified: Secondary | ICD-10-CM

## 2012-06-27 DIAGNOSIS — Z8701 Personal history of pneumonia (recurrent): Secondary | ICD-10-CM

## 2012-06-27 DIAGNOSIS — H9193 Unspecified hearing loss, bilateral: Secondary | ICD-10-CM | POA: Insufficient documentation

## 2012-06-27 DIAGNOSIS — E538 Deficiency of other specified B group vitamins: Secondary | ICD-10-CM

## 2012-06-27 DIAGNOSIS — Z0110 Encounter for examination of ears and hearing without abnormal findings: Secondary | ICD-10-CM

## 2012-06-27 DIAGNOSIS — F341 Dysthymic disorder: Secondary | ICD-10-CM

## 2012-06-27 DIAGNOSIS — F410 Panic disorder [episodic paroxysmal anxiety] without agoraphobia: Secondary | ICD-10-CM

## 2012-06-27 DIAGNOSIS — F17200 Nicotine dependence, unspecified, uncomplicated: Secondary | ICD-10-CM

## 2012-06-27 DIAGNOSIS — H9190 Unspecified hearing loss, unspecified ear: Secondary | ICD-10-CM

## 2012-06-27 MED ORDER — CYANOCOBALAMIN 1000 MCG/ML IJ SOLN
1000.0000 ug | Freq: Once | INTRAMUSCULAR | Status: AC
  Administered 2012-06-27: 1000 ug via INTRAMUSCULAR

## 2012-06-27 MED ORDER — AMITRIPTYLINE HCL 50 MG PO TABS: 50.0000 mg | ORAL_TABLET | Freq: Every day | ORAL | Status: DC

## 2012-06-27 NOTE — Assessment & Plan Note (Signed)
Sent in protonix as less interaction than omeprazole with celexa.

## 2012-06-27 NOTE — Telephone Encounter (Signed)
Form faxed

## 2012-06-27 NOTE — Telephone Encounter (Signed)
Form updated by Dr Danise Mina and refaxed as instructed.

## 2012-06-27 NOTE — Assessment & Plan Note (Signed)
12/2011 

## 2012-06-27 NOTE — Assessment & Plan Note (Signed)
Bilateral low frequency hearing loss noted, L>R.  Given sxs endorsed, will refer to audiology for formal hearing evaluation.

## 2012-06-27 NOTE — Assessment & Plan Note (Signed)
Continue to encourage cessation.  Difficult currently 2/2 worsened stress.

## 2012-06-27 NOTE — Assessment & Plan Note (Signed)
A total of 25 minutes were spent face-to-face with the patient during this encounter and over half of that time was spent on counseling and coordination of care Anticipate worsening anxiety with attacks due to work stress. Discussed options, discussed likely will not improve until work situation improved, support provided. Lorazepam seems to be losing effectiveness, concern for tolerance developing. Discussed dependence and tolerability of benzos. Will start with slow titration of TCA to 38m amitriptyline nightly and higher if needed.  If not improved, consider change from lorazepam to klonopin.

## 2012-06-27 NOTE — Telephone Encounter (Signed)
Filled out and placed in Kim's box.

## 2012-06-27 NOTE — Patient Instructions (Addendum)
Let's increase amitriptyline to 46m nightly for next 1-2 weeks to see if any improvement in anxiety. If this isn't helping, let me know to discuss possible switch from lorazepam to clonazepam. Pass by Marion's office to schedule appointment for audiology evaluation. Good to see you today, call [place] questions.  Hang in there!

## 2012-06-27 NOTE — Progress Notes (Signed)
Subjective:    Patient ID: Sharon [name], female    DOB: [birth year], 56 y.o.   MRN: [id number]  HPI CC: anxiety and hearing check  Sharon [name] comes in today to discuss worsening anxiety and attacks - had attack this morning.  Worsening anxiety attacks since school started.  Endorses stress "tripling" at work.  Feeling threatened at work specifically with one co worker since beginning of year.  Last year had similar issues, participated in counseling which did help but had to stop 2/2 finances.    Does not feel supported at work by principal.  Is planning on going to [employer] to further discuss situation.  Finds herself crying at work, heart pounding, headache. Has been sick recently so behind at work, but feels this is manageable stress, however above issues make things even more difficult.  Takes lorazepam 83m regularly bid, occasional 3rd. Also on celexa 452mdaily and amitriptyline at night time 2542m Recently changed omeprazole to pantoprazole 2/2 celexa 28m97mteraction.  Takes PPI for severe GERD, has breakthrough sxs if doesn't take omeprazole 28mg78m.  Hearing loss? - failed hearing screen today.  Noticed worse trouble with hearing at work.  Needs to have ppl looking at her when talking.  She is told she talks loudly at work.  Has to increase volume at TV.  + intermittent tinnitus.  Current Outpatient Prescriptions on File Prior to Visit  Medication Sig Dispense Refill  . acetaminophen (TYLENOL) 500 MG tablet Take 1,000 mg by mouth every 6 (six) hours as needed. For pain      . amitriptyline (ELAVIL) 25 MG tablet Take 25 mg by mouth at bedtime.       . citalopram (CELEXA) 40 MG tablet TAKE 1 TABLET BY MOUTH DAILY  30 tablet  11  . cyanocobalamin (,VITAMIN B-12,) 1000 MCG/ML injection Inject 1,000 mcg into the muscle every 30 (thirty) days. Last dose 01/13/2012.      . inFMarland KitchenIXimab (REMICADE) 100 MG injection Inject 100 mg into the vein every 8 (eight) weeks.       . LORMarland Kitchenzepam  (ATIVAN) 1 MG tablet TAKE 1 TABLET BY MOUTH 3 TIMES A DAY AS NEEDED FOR ANXIETY  90 tablet  0  . metoprolol succinate (TOPROL-XL) 50 MG 24 hr tablet Take 1 tablet (50 mg total) by mouth daily.  30 tablet  11  . nystatin (MYCOSTATIN) 100000 UNIT/ML suspension Take 5 mLs (500,000 Units total) by mouth 4 (four) times daily.  60 mL  0  . oxyCODONE-acetaminophen (ROXICET) 5-325 MG per tablet Take 1-2 tablets by mouth every 4 (four) hours as needed for pain.  60 tablet  0  . valACYclovir (VALTREX) 500 MG tablet TAKE 1 TABLET BY MOUTH EVERY DAY  30 tablet  6  . zolpidem (AMBIEN) 5 MG tablet Take 1 tablet (5 mg total) by mouth at bedtime as needed for sleep (insomnia).  30 tablet  0  . pantoprazole (PROTONIX) 40 MG tablet Take 1 tablet (40 mg total) by mouth daily.  30 tablet  11   No current facility-administered medications on file prior to visit.    Review of Systems Per HPI    Objective:   Physical Exam  Nursing note and vitals reviewed. Constitutional: She appears well-developed and well-nourished. No distress.       R arm in sling  HENT:  Head: Normocephalic and atraumatic.  Right Ear: Hearing, tympanic membrane, external ear and ear canal normal.  Left Ear: Hearing, tympanic membrane, external ear  and ear canal normal.  Neurological:       Normal rinne test. Weber test lateralizes to left  Psychiatric:       Tears with discussion of work stressors       Assessment & Plan:

## 2012-06-28 NOTE — Telephone Encounter (Signed)
Refaxed form as directed.

## 2012-06-28 NOTE — Telephone Encounter (Signed)
Prior auth denied for pantoprazole; letter faxed to [employer]; in Dr Synthia Innocent box for signature and scanning.

## 2012-06-28 NOTE — Telephone Encounter (Addendum)
spoke with pt - has tried H2 blocker in past without success.  Can we re fax this?  Thanks. Placed in [place]' box.

## 2012-06-29 ENCOUNTER — Ambulatory Visit: Payer: BC Managed Care – PPO

## 2012-07-01 ENCOUNTER — Other Ambulatory Visit: Payer: Self-pay | Admitting: Family Medicine

## 2012-07-01 ENCOUNTER — Other Ambulatory Visit: Payer: Self-pay | Admitting: Gynecology

## 2012-07-02 ENCOUNTER — Encounter (HOSPITAL_COMMUNITY)
Admission: RE | Admit: 2012-07-02 | Discharge: 2012-07-02 | Disposition: A | Discharge status: Home / Self Care | Payer: BC Managed Care – PPO | Source: Ambulatory Visit | Attending: Gastroenterology | Admitting: Gastroenterology

## 2012-07-02 ENCOUNTER — Other Ambulatory Visit (HOSPITAL_COMMUNITY): Payer: Self-pay | Admitting: *Deleted

## 2012-07-02 DIAGNOSIS — G43909 Migraine, unspecified, not intractable, without status migrainosus: Secondary | ICD-10-CM | POA: Insufficient documentation

## 2012-07-02 DIAGNOSIS — F341 Dysthymic disorder: Secondary | ICD-10-CM | POA: Insufficient documentation

## 2012-07-02 DIAGNOSIS — K5090 Crohn's disease, unspecified, without complications: Secondary | ICD-10-CM | POA: Insufficient documentation

## 2012-07-02 DIAGNOSIS — Z8673 Personal history of transient ischemic attack (TIA), and cerebral infarction without residual deficits: Secondary | ICD-10-CM | POA: Insufficient documentation

## 2012-07-02 DIAGNOSIS — I10 Essential (primary) hypertension: Secondary | ICD-10-CM | POA: Insufficient documentation

## 2012-07-02 DIAGNOSIS — F17200 Nicotine dependence, unspecified, uncomplicated: Secondary | ICD-10-CM | POA: Insufficient documentation

## 2012-07-02 MED ORDER — PREDNISONE 20 MG PO TABS
40.0000 mg | ORAL_TABLET | Freq: Once | ORAL | Status: AC
  Administered 2012-07-02: 40 mg via ORAL
  Filled 2012-07-02: qty 2

## 2012-07-02 MED ORDER — SODIUM CHLORIDE 0.9 % IV SOLN
INTRAVENOUS | Status: AC
  Administered 2012-07-02: 10:00:00 via INTRAVENOUS

## 2012-07-02 MED ORDER — ACETAMINOPHEN 500 MG PO TABS
ORAL_TABLET | ORAL | Status: AC
  Administered 2012-07-02: 1000 mg via ORAL
  Filled 2012-07-02: qty 2

## 2012-07-02 MED ORDER — SODIUM CHLORIDE 0.9 % IV SOLN
5.0000 mg/kg | INTRAVENOUS | Status: AC
  Administered 2012-07-02: 400 mg via INTRAVENOUS
  Filled 2012-07-02: qty 40

## 2012-07-02 MED ORDER — ACETAMINOPHEN 500 MG PO TABS
1000.0000 mg | ORAL_TABLET | ORAL | Status: AC
  Administered 2012-07-02: 1000 mg via ORAL

## 2012-07-02 NOTE — Telephone Encounter (Deleted)
Pts insurance

## 2012-07-02 NOTE — Telephone Encounter (Signed)
plz phone in. 

## 2012-07-02 NOTE — Telephone Encounter (Signed)
Rx called in as directed.   

## 2012-07-10 ENCOUNTER — Ambulatory Visit: Payer: BC Managed Care – PPO | Admitting: Family Medicine

## 2012-07-11 ENCOUNTER — Ambulatory Visit: Payer: BC Managed Care – PPO | Admitting: Family Medicine

## 2012-07-11 DIAGNOSIS — Z0289 Encounter for other administrative examinations: Secondary | ICD-10-CM

## 2012-07-24 ENCOUNTER — Encounter (HOSPITAL_COMMUNITY): Payer: Self-pay | Admitting: Emergency Medicine

## 2012-07-24 ENCOUNTER — Emergency Department (HOSPITAL_COMMUNITY)
Admission: EM | Admit: 2012-07-24 | Discharge: 2012-07-24 | Disposition: A | Discharge status: Home / Self Care | Payer: BC Managed Care – PPO | Attending: Emergency Medicine | Admitting: Emergency Medicine

## 2012-07-24 ENCOUNTER — Emergency Department (HOSPITAL_COMMUNITY): Payer: BC Managed Care – PPO

## 2012-07-24 DIAGNOSIS — Z79899 Other long term (current) drug therapy: Secondary | ICD-10-CM | POA: Insufficient documentation

## 2012-07-24 DIAGNOSIS — R109 Unspecified abdominal pain: Secondary | ICD-10-CM

## 2012-07-24 DIAGNOSIS — I10 Essential (primary) hypertension: Secondary | ICD-10-CM | POA: Insufficient documentation

## 2012-07-24 DIAGNOSIS — R112 Nausea with vomiting, unspecified: Secondary | ICD-10-CM | POA: Insufficient documentation

## 2012-07-24 DIAGNOSIS — Z862 Personal history of diseases of the blood and blood-forming organs and certain disorders involving the immune mechanism: Secondary | ICD-10-CM | POA: Insufficient documentation

## 2012-07-24 DIAGNOSIS — R1084 Generalized abdominal pain: Secondary | ICD-10-CM | POA: Insufficient documentation

## 2012-07-24 DIAGNOSIS — Z8739 Personal history of other diseases of the musculoskeletal system and connective tissue: Secondary | ICD-10-CM | POA: Insufficient documentation

## 2012-07-24 DIAGNOSIS — F341 Dysthymic disorder: Secondary | ICD-10-CM | POA: Insufficient documentation

## 2012-07-24 DIAGNOSIS — Z8619 Personal history of other infectious and parasitic diseases: Secondary | ICD-10-CM | POA: Insufficient documentation

## 2012-07-24 DIAGNOSIS — K5090 Crohn's disease, unspecified, without complications: Secondary | ICD-10-CM | POA: Insufficient documentation

## 2012-07-24 DIAGNOSIS — Z8673 Personal history of transient ischemic attack (TIA), and cerebral infarction without residual deficits: Secondary | ICD-10-CM | POA: Insufficient documentation

## 2012-07-24 DIAGNOSIS — Z872 Personal history of diseases of the skin and subcutaneous tissue: Secondary | ICD-10-CM | POA: Insufficient documentation

## 2012-07-24 DIAGNOSIS — K219 Gastro-esophageal reflux disease without esophagitis: Secondary | ICD-10-CM | POA: Insufficient documentation

## 2012-07-24 DIAGNOSIS — Z8541 Personal history of malignant neoplasm of cervix uteri: Secondary | ICD-10-CM | POA: Insufficient documentation

## 2012-07-24 DIAGNOSIS — Z8719 Personal history of other diseases of the digestive system: Secondary | ICD-10-CM | POA: Insufficient documentation

## 2012-07-24 DIAGNOSIS — F17200 Nicotine dependence, unspecified, uncomplicated: Secondary | ICD-10-CM | POA: Insufficient documentation

## 2012-07-24 DIAGNOSIS — R197 Diarrhea, unspecified: Secondary | ICD-10-CM | POA: Insufficient documentation

## 2012-07-24 LAB — CBC WITH DIFFERENTIAL/PLATELET
Eosinophils Relative: 2 % (ref 0–5)
Hemoglobin: 13.4 g/dL (ref 12.0–15.0)
Lymphocytes Relative: 34 % (ref 12–46)
Lymphs Abs: 2.5 10*3/uL (ref 0.7–4.0)
MCH: 31.1 pg (ref 26.0–34.0)
MCV: 90.7 fL (ref 78.0–100.0)
Monocytes Relative: 8 % (ref 3–12)
Platelets: 242 10*3/uL (ref 150–400)
RBC: 4.31 MIL/uL (ref 3.87–5.11)
WBC: 7.4 10*3/uL (ref 4.0–10.5)

## 2012-07-24 LAB — COMPREHENSIVE METABOLIC PANEL
ALT: 38 U/L — ABNORMAL HIGH (ref 0–35)
Alkaline Phosphatase: 115 U/L (ref 39–117)
BUN: 7 mg/dL (ref 6–23)
CO2: 21 mEq/L (ref 19–32)
Calcium: 9.2 mg/dL (ref 8.4–10.5)
GFR calc Af Amer: >90 mL/min (ref >90)
GFR calc non Af Amer: >90 mL/min (ref >90)
Glucose, Bld: 94 mg/dL (ref 70–99)
Potassium: 3.4 mEq/L — ABNORMAL LOW (ref 3.5–5.1)
Sodium: 140 mEq/L (ref 135–145)

## 2012-07-24 MED ORDER — MORPHINE SULFATE 4 MG/ML IJ SOLN
4.0000 mg | Freq: Once | INTRAMUSCULAR | Status: AC
  Administered 2012-07-24: 4 mg via INTRAVENOUS
  Filled 2012-07-24: qty 1

## 2012-07-24 MED ORDER — ONDANSETRON HCL 4 MG/2ML IJ SOLN
4.0000 mg | Freq: Once | INTRAMUSCULAR | Status: AC
  Administered 2012-07-24: 4 mg via INTRAVENOUS
  Filled 2012-07-24: qty 2

## 2012-07-24 MED ORDER — ONDANSETRON HCL 4 MG PO TABS: 4.0000 mg | ORAL_TABLET | Freq: Three times a day (TID) | ORAL | Status: DC | PRN

## 2012-07-24 MED ORDER — DEXAMETHASONE SODIUM PHOSPHATE 10 MG/ML IJ SOLN
10.0000 mg | Freq: Once | INTRAMUSCULAR | Status: AC
  Administered 2012-07-24: 10 mg via INTRAVENOUS
  Filled 2012-07-24: qty 1

## 2012-07-24 MED ORDER — PREDNISONE 10 MG PO TABS: 20.0000 mg | ORAL_TABLET | Freq: Two times a day (BID) | ORAL | Status: DC

## 2012-07-24 MED ORDER — OXYCODONE-ACETAMINOPHEN 5-325 MG PO TABS: ORAL_TABLET | ORAL | Status: DC

## 2012-07-24 MED ORDER — SODIUM CHLORIDE 0.9 % IV BOLUS (SEPSIS)
1000.0000 mL | Freq: Once | INTRAVENOUS | Status: AC
  Administered 2012-07-24: 1000 mL via INTRAVENOUS

## 2012-07-24 MED ORDER — IOHEXOL 300 MG/ML  SOLN
100.0000 mL | Freq: Once | INTRAMUSCULAR | Status: AC | PRN
  Administered 2012-07-24: 100 mL via INTRAVENOUS

## 2012-07-24 NOTE — ED Notes (Signed)
Pt tolerated fluids. Drank 3/4 cup of sprite. Reports it hurt her stomach but she is not nauseated.

## 2012-07-24 NOTE — ED Notes (Signed)
Pain and  Rt sided abd pain  And swelling had  Diarrhea x 4 weeks now having vomiting and constipation

## 2012-07-24 NOTE — ED Notes (Signed)
States was sent from dr's office, called dr [name] and told to come here,

## 2012-07-24 NOTE — ED Provider Notes (Signed)
History     CSN: [id number]  Arrival date & time 07/24/12  1435   First MD Initiated Contact with Patient 07/24/12 1522      No chief complaint on file.   (Consider location/radiation/quality/duration/timing/severity/associated sxs/prior treatment) HPI  Sharon [name] is a 56 y.o. female with past medical history significant for Crohn's taking Remicade complaining of 4 weeks of diarrhea, intermittent nausea and vomiting. Patient has newly developed abdominal pain rated at 6/10 it is diffuse and nonlocalized this started 24 hours ago. Pain feels like prior Crohn's flareups. Patient has not had a bowel movement in 2 days. Patient has had one prior bowel resection and an appendectomy. She is followed by [facility] Dr. [name]. Patient denies fever, urinary symptoms, chest pain palpitations or  active emesis    Past Medical History  Diagnosis Date  . HTN (hypertension)   . Migraines     and frequent other headaches (sinus or stress)  . Crohn's disease     h/o stenotic crohn's, active in TI, PPD neg ([facility] Dr. [name]); started cimzia, ?failure given flare despite treatment, consdering remicade  . Cervical cancer 12/2008    s/p hysterectomy  . History of chicken pox   . Anxiety and depression   . History of anemia     attributed to crohn's  . Arthritis   . Genital warts   . History of syphilis 1980s  . Smoker   . TIA (transient ischemic attack) 05/2010    at [facility] - TIA vs complex migraine (w/u negative - carotids, echo, TC doppler, and MRI normal)  . Hepatic steatosis 04/2011    diffuse on CT, 5m gallbladder polyp  . B12 deficiency     pernicious anemia - monthly b12 shots  . Scalp psoriasis   . GERD (gastroesophageal reflux disease)     severe, daily sxs if off PPI    Past Surgical History  Procedure Date  . Appendectomy 1998  . Right colectomy 2000    crohn's disease, ileocecal resection  . Hospitalization 05/2010    TIA vs complex migraine - w/u negative - head  CT, MRI/MRA, carotid dopplers, echo.  treated with ASA and tramadol  . [place] 05/[place]    nl LV fxn ,EF 60%  . [place]    diffuse hepatic steatosis, 527mpolyp in gallbaldder fundus, no stones, mod abd ath  . Colonoscopy 10/2008    ileo-colonic anastomosis, ielocolonic crohn's  . Colonoscopy 05/2011    ileo-colonic anastomosis normal, normal mucosa  . Esophagogastroduodenoscopy 05/2011    nl esophagus, gastritis, nl duodenum  . Cervical biopsy  w/ loop electrode excision 10/2008  . Vaginal hysterectomy 12/2008    LAVH/BSO    Family History  Problem Relation Age of Onset  . Crohn's disease Daughter     crohn's, ulcerative colitis  . Ulcerative colitis Daughter   . Stroke Mother   . Hypertension Mother   . Hyperlipidemia Mother   . Hypertension Father   . Crohn's disease Father   . Crohn's disease Sister   . Cancer Sister     ovarian  . Crohn's disease Paternal Aunt   . Crohn's disease Paternal Uncle   . Diabetes Neg Hx     History  Substance Use Topics  . Smoking status: Current Every Day Smoker -- 0.3 packs/day for 10 years    Types: Cigarettes  . Smokeless tobacco: Never Used  . Alcohol Use: No     Comment: remote EtOh use  OB History    Grav Para Term Preterm Abortions TAB SAB Ect Mult Living   4 3   1  1   3       Review of Systems  Constitutional: Negative for fever.  Respiratory: Negative for shortness of breath.   Cardiovascular: Negative for chest pain.  Gastrointestinal: Negative for nausea, vomiting, abdominal pain and diarrhea.  All other systems reviewed and are negative.    Allergies  Codeine; Ibuprofen; and Remeron  Home Medications   Current Outpatient Rx  Name  Route  Sig  Dispense  Refill  . ACETAMINOPHEN 500 MG PO TABS   Oral   Take 1,000 mg by mouth every 6 (six) hours as needed. For pain         . ACYCLOVIR 5 % EX OINT   Topical   Apply topically every 3 (three) hours.   15 g   4   . AMITRIPTYLINE HCL 50 MG  PO TABS   Oral   Take 1 tablet (50 mg total) by mouth at bedtime.   30 tablet   3   . CITALOPRAM HYDROBROMIDE 40 MG PO TABS      TAKE 1 TABLET BY MOUTH DAILY   30 tablet   11   . CYANOCOBALAMIN 1000 MCG/ML IJ SOLN   Intramuscular   Inject 1,000 mcg into the muscle every 30 (thirty) days. Last dose 01/13/2012.         . INFLIXIMAB 100 MG IV SOLR   Intravenous   Inject 100 mg into the vein every 8 (eight) weeks.          Marland Kitchen LORAZEPAM 1 MG PO TABS      TAKE 1 TABLET BY MOUTH 3 TIMES A DAY AS NEEDED   90 tablet   0   . METOPROLOL SUCCINATE ER 50 MG PO TB24   Oral   Take 1 tablet (50 mg total) by mouth daily.   30 tablet   11   . NYSTATIN 100000 UNIT/ML MT SUSP   Oral   Take 5 mLs (500,000 Units total) by mouth 4 (four) times daily.   60 mL   0   . OXYCODONE-ACETAMINOPHEN 5-325 MG PO TABS   Oral   Take 1-2 tablets by mouth every 4 (four) hours as needed for pain.   60 tablet   0   . PANTOPRAZOLE SODIUM 40 MG PO TBEC   Oral   Take 1 tablet (40 mg total) by mouth daily.   30 tablet   11   . VALACYCLOVIR HCL 500 MG PO TABS      TAKE 1 TABLET BY MOUTH EVERY DAY   30 tablet   6   . ZOLPIDEM TARTRATE 5 MG PO TABS   Oral   Take 1 tablet (5 mg total) by mouth at bedtime as needed for sleep (insomnia).   30 tablet   0     BP 128/92  Pulse 100  Temp 97.4 F (36.3 C)  Resp 16  SpO2 99%  Physical Exam  Nursing note and vitals reviewed. Constitutional: She is oriented to person, place, and time. She appears well-developed and well-nourished. No distress.  HENT:  Head: Normocephalic.  Eyes: Conjunctivae normal and EOM are normal.  Cardiovascular: Normal rate.   Pulmonary/Chest: Effort normal. No stridor.  Abdominal: Soft. Bowel sounds are normal. She exhibits distension. She exhibits no mass. There is tenderness. There is no rebound and no guarding.  Musculoskeletal: Normal range of motion.  Neurological:  She is alert and oriented to person, place, and  time.  Psychiatric: She has a normal mood and affect.    ED Course  Procedures (including critical care time)  Labs Reviewed  COMPREHENSIVE METABOLIC PANEL - Abnormal; Notable for the following:    Potassium 3.4 (*)     AST 41 (*)     ALT 38 (*)     All other components within normal limits  LIPASE, BLOOD  CBC WITH DIFFERENTIAL   Ct Abdomen Pelvis W Contrast  07/24/2012  *RADIOLOGY REPORT*  Clinical Data: Current history of Crohn's disease, presenting with right-sided abdominal pain, abdominal swelling, nausea and vomiting.  Surgical history includes bowel resection and hysterectomy.  CT ABDOMEN AND PELVIS WITH CONTRAST  Technique:  Multidetector CT imaging of the abdomen and pelvis was performed following the standard protocol during bolus administration of intravenous contrast.  Contrast: 175m OMNIPAQUE IOHEXOL 300 MG/ML. Oral contrast was also administered.  Comparison: CT abdomen and pelvis 01/13/2012, 11/15/2011, 03/09/2011, 01/14/2011, 12/31/2007.  Findings: No evidence of bowel inflammation to suggest an acute Crohn's flare at this time.  Fat within the wall of a long segment of the distal ileum, consistent with chronic inflammatory disease. Widely patent ileocolic anastomosis in the right mid abdomen.  No evidence of bowel obstruction.  No visible colonic diverticula. Normal-appearing stomach containing food.  No ascites.  Severe diffuse hepatic steatosis without focal hepatic parenchymal abnormality, unchanged since the most recent examination.  Normal appearing spleen, pancreas, adrenal glands, and kidneys. Gallbladder mildly contracted but otherwise unremarkable.  No biliary ductal dilation.  Marked atherosclerosis involving the abdominal aorta and the iliac arteries without evidence of aneurysm.  No significant lymphadenopathy.  Urinary bladder unremarkable.  Uterus surgically absent.  No adnexal masses or free pelvic fluid.  Phlebolith low in the left side of the pelvis.  Visualized  lung bases clear.  Heart size normal.  Bone window images unremarkable apart from mild facet degenerative changes involve the lower lumbar spine.  IMPRESSION:  1.  No acute abnormalities involving the abdomen or pelvis. 2.  Severe diffuse hepatic steatosis without focal hepatic parenchymal abnormality. 3.  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   Original Report Authenticated By: TEvangeline Dakin M.D.    Dg Abd Acute W/chest  07/24/2012  *RADIOLOGY REPORT*  Clinical Data: Constipation with increased abdominal pain, nausea and vomiting.  History of Crohn's disease.  ACUTE ABDOMEN SERIES (ABDOMEN 2 VIEW & CHEST 1 VIEW)  Comparison: Acute abdominal series 01/13/2012.  Abdominal radiographs 01/15/2012. CT 01/13/2012.  Findings: The heart size and mediastinal contours are normal. The lungs are clear. There is no pleural effusion or pneumothorax. No acute osseous findings are identified.  The abdomen is nearly gasless.  There are several fluid filled loops of bowel with scattered air fluid levels on the erect examination.  There are postsurgical changes within the right lower quadrant.  No free intraperitoneal air or suspicious abdominal calcification is seen.  The osseous structures appear unremarkable.  IMPRESSION:  1.  Nearly gasless abdomen with fluid filled bowel and scattered air fluid levels suspicious for recurrent inflammation and partial bowel obstruction.  No evidence of pneumoperitoneum. 2.  No active cardiopulmonary process.   Original Report Authenticated By: WRichardean Sale M.D.      1. Abdominal pain       MDM  Patient with Crohn disease taking Remicade has had one prior bowel resection. Patient reports nausea vomiting and diarrhea for 4 weeks changing to constipation with no bowel movement over the last  48 hours and new onset of moderate abdominal pain.   Patient will be given morphine Zofran and Decadron  Acute abdominal series shows a nearly gasless abdomen scattered air-fluid  levels and partial SBO with no pneumoperitoneum.    CT abdomen pelvis shows no acute abnormalities with no evidence of bowel inflammation. Incidentally there is severe diffuse hepatic steatosis and advanced aortoiliac atherosclerosis.  Discussed case with attending who agrees with plan and stability to d/c to home.   New Prescriptions   ONDANSETRON (ZOFRAN) 4 MG TABLET    Take 1 tablet (4 mg total) by mouth every 8 (eight) hours as needed for nausea.   OXYCODONE-ACETAMINOPHEN (PERCOCET/ROXICET) 5-325 MG PER TABLET    1 to 2 tabs PO q6hrs  PRN for pain   PREDNISONE (DELTASONE) 10 MG TABLET    Take 2 tablets (20 mg total) by mouth 2 (two) times daily.      Monico Blitz, PA-C 07/24/12 2020

## 2012-07-24 NOTE — ED Provider Notes (Signed)
Medical screening examination/treatment/procedure(s) were performed by non-physician practitioner and as supervising physician I was immediately available for consultation/collaboration.  Veryl Speak, MD 07/24/12 2037

## 2012-07-24 NOTE — ED Notes (Signed)
Patient provided sprite for fluid challenge.

## 2012-07-24 NOTE — ED Notes (Signed)
Pt dc to home with family.  meds given per md order.  Pt states understanding to dc orders.

## 2012-07-29 DIAGNOSIS — I5181 Takotsubo syndrome: Secondary | ICD-10-CM

## 2012-07-29 HISTORY — PX: CARDIAC CATHETERIZATION: SHX172

## 2012-07-29 HISTORY — DX: Takotsubo syndrome: I51.81

## 2012-07-31 ENCOUNTER — Inpatient Hospital Stay (HOSPITAL_COMMUNITY)
Admission: EM | Admit: 2012-07-31 | Discharge: 2012-08-02 | DRG: 125 | Disposition: A | Discharge status: Home / Self Care | Payer: BC Managed Care – PPO | Attending: Cardiovascular Disease | Admitting: Cardiovascular Disease

## 2012-07-31 ENCOUNTER — Telehealth: Payer: Self-pay

## 2012-07-31 ENCOUNTER — Encounter (HOSPITAL_COMMUNITY): Payer: Self-pay | Admitting: *Deleted

## 2012-07-31 ENCOUNTER — Emergency Department (HOSPITAL_COMMUNITY): Payer: BC Managed Care – PPO

## 2012-07-31 ENCOUNTER — Other Ambulatory Visit: Payer: Self-pay

## 2012-07-31 DIAGNOSIS — Z9089 Acquired absence of other organs: Secondary | ICD-10-CM

## 2012-07-31 DIAGNOSIS — F17200 Nicotine dependence, unspecified, uncomplicated: Secondary | ICD-10-CM | POA: Diagnosis present

## 2012-07-31 DIAGNOSIS — M129 Arthropathy, unspecified: Secondary | ICD-10-CM | POA: Diagnosis present

## 2012-07-31 DIAGNOSIS — Z886 Allergy status to analgesic agent status: Secondary | ICD-10-CM

## 2012-07-31 DIAGNOSIS — R0789 Other chest pain: Principal | ICD-10-CM | POA: Diagnosis present

## 2012-07-31 DIAGNOSIS — Z888 Allergy status to other drugs, medicaments and biological substances status: Secondary | ICD-10-CM

## 2012-07-31 DIAGNOSIS — F3289 Other specified depressive episodes: Secondary | ICD-10-CM | POA: Diagnosis present

## 2012-07-31 DIAGNOSIS — Z885 Allergy status to narcotic agent status: Secondary | ICD-10-CM

## 2012-07-31 DIAGNOSIS — K5000 Crohn's disease of small intestine without complications: Secondary | ICD-10-CM | POA: Diagnosis present

## 2012-07-31 DIAGNOSIS — Z8249 Family history of ischemic heart disease and other diseases of the circulatory system: Secondary | ICD-10-CM

## 2012-07-31 DIAGNOSIS — Z8541 Personal history of malignant neoplasm of cervix uteri: Secondary | ICD-10-CM

## 2012-07-31 DIAGNOSIS — K219 Gastro-esophageal reflux disease without esophagitis: Secondary | ICD-10-CM | POA: Diagnosis present

## 2012-07-31 DIAGNOSIS — Z90710 Acquired absence of both cervix and uterus: Secondary | ICD-10-CM

## 2012-07-31 DIAGNOSIS — I10 Essential (primary) hypertension: Secondary | ICD-10-CM | POA: Diagnosis present

## 2012-07-31 DIAGNOSIS — Z8673 Personal history of transient ischemic attack (TIA), and cerebral infarction without residual deficits: Secondary | ICD-10-CM

## 2012-07-31 DIAGNOSIS — I214 Non-ST elevation (NSTEMI) myocardial infarction: Secondary | ICD-10-CM

## 2012-07-31 DIAGNOSIS — R079 Chest pain, unspecified: Secondary | ICD-10-CM

## 2012-07-31 DIAGNOSIS — F411 Generalized anxiety disorder: Secondary | ICD-10-CM | POA: Diagnosis present

## 2012-07-31 DIAGNOSIS — R748 Abnormal levels of other serum enzymes: Secondary | ICD-10-CM | POA: Diagnosis present

## 2012-07-31 DIAGNOSIS — E538 Deficiency of other specified B group vitamins: Secondary | ICD-10-CM | POA: Diagnosis present

## 2012-07-31 DIAGNOSIS — F329 Major depressive disorder, single episode, unspecified: Secondary | ICD-10-CM | POA: Diagnosis present

## 2012-07-31 DIAGNOSIS — Z79899 Other long term (current) drug therapy: Secondary | ICD-10-CM

## 2012-07-31 DIAGNOSIS — IMO0002 Reserved for concepts with insufficient information to code with codable children: Secondary | ICD-10-CM

## 2012-07-31 HISTORY — DX: Tobacco use: Z72.0

## 2012-07-31 LAB — CBC
HCT: 39.4 % (ref 36.0–46.0)
Hemoglobin: 13.5 g/dL (ref 12.0–15.0)
MCH: 30.6 pg (ref 26.0–34.0)
MCV: 89.3 fL (ref 78.0–100.0)
Platelets: 245 10*3/uL (ref 150–400)
RBC: 4.41 MIL/uL (ref 3.87–5.11)

## 2012-07-31 LAB — URINALYSIS, ROUTINE W REFLEX MICROSCOPIC
Bilirubin Urine: NEGATIVE
Ketones, ur: NEGATIVE mg/dL
Nitrite: NEGATIVE
Protein, ur: NEGATIVE mg/dL
Urobilinogen, UA: 0.2 mg/dL (ref 0.0–1.0)

## 2012-07-31 LAB — COMPREHENSIVE METABOLIC PANEL
ALT: 25 U/L (ref 0–35)
Alkaline Phosphatase: 106 U/L (ref 39–117)
CO2: 24 mEq/L (ref 19–32)
Calcium: 9.5 mg/dL (ref 8.4–10.5)
Chloride: 102 mEq/L (ref 96–112)
GFR calc Af Amer: >90 mL/min (ref >90)
GFR calc non Af Amer: >90 mL/min (ref >90)
Glucose, Bld: 100 mg/dL — ABNORMAL HIGH (ref 70–99)
Potassium: 3.5 mEq/L (ref 3.5–5.1)
Sodium: 139 mEq/L (ref 135–145)
Total Bilirubin: 0.3 mg/dL (ref 0.3–1.2)

## 2012-07-31 LAB — POCT I-STAT, CHEM 8
Calcium, Ion: 1.14 mmol/L (ref 1.12–1.23)
Glucose, Bld: 97 mg/dL (ref 70–99)
HCT: 42 % (ref 36.0–46.0)
Hemoglobin: 14.3 g/dL (ref 12.0–15.0)

## 2012-07-31 LAB — URINE MICROSCOPIC-ADD ON

## 2012-07-31 LAB — TROPONIN I: Troponin I: 0.3 ng/mL (ref <0.30)

## 2012-07-31 MED ORDER — ENOXAPARIN SODIUM 40 MG/0.4ML ~~LOC~~ SOLN
70.0000 mg | Freq: Once | SUBCUTANEOUS | Status: AC
  Administered 2012-07-31: 70 mg via SUBCUTANEOUS
  Filled 2012-07-31: qty 0.3

## 2012-07-31 MED ORDER — SODIUM CHLORIDE 0.9 % IV SOLN: 20.0000 mL | INTRAVENOUS | Status: DC

## 2012-07-31 MED ORDER — NITROGLYCERIN 2 % TD OINT
1.0000 [in_us] | TOPICAL_OINTMENT | Freq: Four times a day (QID) | TRANSDERMAL | Status: DC
  Administered 2012-07-31: 1 [in_us] via TOPICAL
  Filled 2012-07-31: qty 1

## 2012-07-31 MED ORDER — NITROGLYCERIN 0.4 MG SL SUBL: 0.4000 mg | SUBLINGUAL_TABLET | SUBLINGUAL | Status: DC | PRN

## 2012-07-31 NOTE — ED Notes (Signed)
EMS called to PT PCP because Pt reported CP during a routine office visit.EMS gave PT 345m ASA and 1 NGT . CP went fron 5/10 to 3/10 on arrival to ed. Pt reports increased stress in her life at this time.

## 2012-07-31 NOTE — ED Provider Notes (Signed)
I saw and evaluated the patient, reviewed the resident's note and I agree with the findings and plan.  Agree with EKG interpretation if present.   Pt with CP, SOB, originally thought to be panic attack but second troponin is positive. NTG paste, lovenox, admit for further eval.   Charles B. Karle Starch, MD 07/31/12 2322

## 2012-07-31 NOTE — ED Notes (Signed)
MD at bedside. Pt reports pain is now 4/10

## 2012-07-31 NOTE — ED Notes (Signed)
Pt back from x-ray.

## 2012-07-31 NOTE — ED Provider Notes (Signed)
History     CSN: [id number]  Arrival date & time 07/31/12  1728   First MD Initiated Contact with Patient 07/31/12 1812     Chief Complaint  Patient presents with  . Chest Pain   HPI: Sharon [name] is a 56 yo CF with history of HTN, chron's disease, anxiety, TIA and tobacco abuse who presents with chest pain. She was DC from our facility last week for chron's flare. She was at her GI physician's office this afternoon for recheck. She began to experience symptoms c/w previous panic attacks including flushing, tachypnea, tingling in hands, but she also experienced substernal chest pain and SOB. Chest pain was described as pressure, radiating to neck and left arm, constant, 5/10 initially. No exacerbating factors. Associated with SOB and nausea but not vomiting. EMS was called, she received ASA and NGT enroute with improvement of her pain to 3/10. On arrival her pain was 1/10, SOB had resolved. She does not normally have chest pain with panic attacks.   Past Medical History  Diagnosis Date  . HTN (hypertension)   . Migraines     and frequent other headaches (sinus or stress)  . Crohn's disease     h/o stenotic crohn's, active in TI, PPD neg ([facility] Dr. [name]); started cimzia, ?failure given flare despite treatment, consdering remicade  . Cervical cancer 12/2008    s/p hysterectomy  . History of chicken pox   . Anxiety and depression   . History of anemia     attributed to crohn's  . Arthritis   . Genital warts   . History of syphilis 1980s  . Smoker   . TIA (transient ischemic attack) 05/2010    at [facility] - TIA vs complex migraine (w/u negative - carotids, echo, TC doppler, and MRI normal)  . Hepatic steatosis 04/2011    diffuse on CT, 71m gallbladder polyp  . B12 deficiency     pernicious anemia - monthly b12 shots  . Scalp psoriasis   . GERD (gastroesophageal reflux disease)     severe, daily sxs if off PPI    Past Surgical History  Procedure Date  . Appendectomy 1998  .  Right colectomy 2000    crohn's disease, ileocecal resection  . Hospitalization 05/2010    TIA vs complex migraine - w/u negative - head CT, MRI/MRA, carotid dopplers, echo.  treated with ASA and tramadol  . [place]ocardiography 05/2010    nl LV fxn ,EF 60%  . [place]    diffuse hepatic steatosis, 592mpolyp in gallbaldder fundus, no stones, mod abd ath  . Colonoscopy 10/2008    ileo-colonic anastomosis, ielocolonic crohn's  . Colonoscopy 05/2011    ileo-colonic anastomosis normal, normal mucosa  . Esophagogastroduodenoscopy 05/2011    nl esophagus, gastritis, nl duodenum  . Cervical biopsy  w/ loop electrode excision 10/2008  . Vaginal hysterectomy 12/2008    LAVH/BSO    Family History  Problem Relation Age of Onset  . Crohn's disease Daughter     crohn's, ulcerative colitis  . Ulcerative colitis Daughter   . Stroke Mother   . Hypertension Mother   . Hyperlipidemia Mother   . Hypertension Father   . Crohn's disease Father   . Crohn's disease Sister   . Cancer Sister     ovarian  . Crohn's disease Paternal Aunt   . Crohn's disease Paternal Uncle   . Diabetes Neg Hx     History  Substance Use Topics  . Smoking  status: Current Every Day Smoker -- 0.3 packs/day for 10 years    Types: Cigarettes  . Smokeless tobacco: Never Used  . Alcohol Use: No     Comment: remote EtOh use    OB History    Grav Para Term Preterm Abortions TAB SAB Ect Mult Living   4 3   1  1   3       Review of Systems  Constitutional: Negative for fever, chills, appetite change and fatigue.  Eyes: Negative for photophobia and visual disturbance.  Respiratory: Positive for shortness of breath. Negative for cough, chest tightness and wheezing.   Cardiovascular: Positive for chest pain. Negative for palpitations.  Gastrointestinal: Positive for nausea. Negative for vomiting and abdominal pain.  Genitourinary: Negative for dysuria, urgency and decreased urine volume.  Musculoskeletal: Negative  for myalgias, back pain and arthralgias.  Skin: Negative for pallor and rash.  Neurological: Positive for numbness (in her hands). Negative for dizziness, speech difficulty and headaches.  Psychiatric/Behavioral: Negative for confusion and agitation.    Allergies  Codeine; Ibuprofen; and Remeron  Home Medications   Current Outpatient Rx  Name  Route  Sig  Dispense  Refill  . ACETAMINOPHEN 500 MG PO TABS   Oral   Take 1,000 mg by mouth every 6 (six) hours as needed. For pain         . AMITRIPTYLINE HCL 50 MG PO TABS   Oral   Take 50 mg by mouth at bedtime.         . BUDESONIDE ER 3 MG PO CP24   Oral   Take 9 mg by mouth every morning.         Marland Kitchen CITALOPRAM HYDROBROMIDE 40 MG PO TABS   Oral   Take 40 mg by mouth every morning.         Marland Kitchen CYANOCOBALAMIN 1000 MCG/ML IJ SOLN   Intramuscular   Inject 1,000 mcg into the muscle every 30 (thirty) days. Last dose 01/13/2012.         . INFLIXIMAB 100 MG IV SOLR   Intravenous   Inject 100 mg into the vein every 8 (eight) weeks.          Marland Kitchen LORAZEPAM 1 MG PO TABS   Oral   Take 1 mg by mouth 3 (three) times daily.         Marland Kitchen METOPROLOL SUCCINATE ER 50 MG PO TB24   Oral   Take 50 mg by mouth at bedtime.         Marland Kitchen ONDANSETRON HCL 4 MG PO TABS   Oral   Take 4 mg by mouth every 8 (eight) hours as needed. For nausea/vomiting         . OXYCODONE-ACETAMINOPHEN 5-325 MG PO TABS   Oral   Take 1-2 tablets by mouth every 6 (six) hours as needed. for pain         . VALACYCLOVIR HCL 500 MG PO TABS   Oral   Take 500 mg by mouth daily.         Marland Kitchen ZOLPIDEM TARTRATE 5 MG PO TABS   Oral   Take 5 mg by mouth at bedtime as needed. For sleep           BP 129/63  Pulse 74  Temp 98.2 F (36.8 C) (Oral)  Resp 19  SpO2 100%  Physical Exam  Nursing note and vitals reviewed. Constitutional: She is oriented to person, place, and time. She appears well-developed and well-nourished. She  is cooperative. She does not  appear ill.  HENT:  Head: Normocephalic and atraumatic.  Mouth/Throat: Oropharynx is clear and moist and mucous membranes are normal.  Eyes: Conjunctivae normal and EOM are normal. Pupils are equal, round, and reactive to light.  Neck: Trachea normal, normal range of motion and full passive range of motion without pain.  Cardiovascular: Normal rate, regular rhythm, S1 normal, S2 normal and normal heart sounds.  Exam reveals no decreased pulses.   Pulmonary/Chest: Effort normal and breath sounds normal. No respiratory distress. She has no decreased breath sounds.  Abdominal: Soft. Normal appearance and bowel sounds are normal. There is no tenderness.  Musculoskeletal: Normal range of motion. She exhibits no edema.  Neurological: She is alert and oriented to person, place, and time.  Skin: Skin is warm and dry.    ED Course  Procedures  Labs Reviewed  CBC - Abnormal; Notable for the following:    WBC 11.4 (*)     All other components within normal limits  COMPREHENSIVE METABOLIC PANEL - Abnormal; Notable for the following:    Glucose, Bld 100 (*)     All other components within normal limits  URINALYSIS, ROUTINE W REFLEX MICROSCOPIC - Abnormal; Notable for the following:    Hgb urine dipstick TRACE (*)     Leukocytes, UA MODERATE (*)     All other components within normal limits  URINE MICROSCOPIC-ADD ON - Abnormal; Notable for the following:    Squamous Epithelial / LPF FEW (*)     All other components within normal limits  TROPONIN I - Abnormal; Notable for the following:    Troponin I 0.30 (*)     All other components within normal limits  POCT I-STAT, CHEM 8  POCT I-STAT TROPONIN I   Dg Chest 2 View  07/31/2012  *RADIOLOGY REPORT*  Clinical Data: Anxiety attack.  Left chest pain and tightness.  CHEST - 2 VIEW  Comparison: Acute abdominal series 07/24/2012 and two-view chest 08/11/2011.  Findings: The heart size and mediastinal contours are normal. The lungs are clear. There is  no pleural effusion or pneumothorax. No acute osseous findings are identified.  IMPRESSION: No active cardiopulmonary process.   Original Report Authenticated By: Richardean Sale, M.D.    1. Non-STEMI (non-ST elevated myocardial infarction)    MDM  56 yo CF with history of HTN, chron's disease, anxiety, TIA and tobacco abuse who presents with chest pain. Afebrile, vital signs stable. Symptoms partially c/w previous panic attacks but normally does not have CP. Due to radiation to neck concern for ACS. Initial ECG without acute ischemic changes. Patient received ASA prior to arrival. Initial troponin normal. Labs: WBC 11.4, Hgb 13.5, normal lytes and cr. CXR without acute pathology. Urine not c/w infection. Patient with HEART score of 3, so initial plan was to repeat troponin and DC home. However, second troponin positive. Shortly after this troponin returned positive she again complained of substernal chest pain, repeat ECG unchanged. Started nitropaste. Discussed with Cardiology who recommended treatment dose Lovenox which was given. Discussed results of work-up with patient and need for admission, she in agreement with plan.   Reviewed imaging, labs and previous medical records, utilized in MDM  Discussed case with Dr. Karle Starch  ECG: SR, rate 73, normal axis, T wave inversion in lead V1. When compared to ECG from 10/2011 no significant change was found.   Clinical Impression 1. NSTEMI 2. Leukocytosis        Louretta Shorten, MD 08/01/12 [phone]

## 2012-07-31 NOTE — Telephone Encounter (Signed)
Pt left v/m at 06/27/12 visit Dr Darnell Level & pt discussed changing lorazepam to clonazepam. Lorazepam is not helping pt at all. Pt request new med sent to [facility] or if pt needs another appt call pt and she will schedule.Please advise.

## 2012-07-31 NOTE — ED Notes (Signed)
Pt in xray

## 2012-07-31 NOTE — ED Notes (Signed)
MD made aware of troponin level.

## 2012-08-01 ENCOUNTER — Encounter (HOSPITAL_COMMUNITY)
Admission: EM | Disposition: A | Discharge status: Home / Self Care | Payer: Self-pay | Source: Home / Self Care | Attending: Cardiovascular Disease

## 2012-08-01 ENCOUNTER — Other Ambulatory Visit: Payer: Self-pay

## 2012-08-01 DIAGNOSIS — R079 Chest pain, unspecified: Secondary | ICD-10-CM

## 2012-08-01 HISTORY — PX: LEFT HEART CATHETERIZATION WITH CORONARY ANGIOGRAM: SHX5451

## 2012-08-01 LAB — CBC
HCT: 39.8 % (ref 36.0–46.0)
Hemoglobin: 13.4 g/dL (ref 12.0–15.0)
RBC: 4.41 MIL/uL (ref 3.87–5.11)

## 2012-08-01 LAB — BASIC METABOLIC PANEL
CO2: 23 mEq/L (ref 19–32)
Chloride: 102 mEq/L (ref 96–112)
Glucose, Bld: 92 mg/dL (ref 70–99)
Potassium: 3.7 mEq/L (ref 3.5–5.1)
Sodium: 137 mEq/L (ref 135–145)

## 2012-08-01 LAB — LIPID PANEL
Cholesterol: 206 mg/dL — ABNORMAL HIGH (ref 0–200)
HDL: 61 mg/dL (ref >39)
Total CHOL/HDL Ratio: 3.4 RATIO
Triglycerides: 171 mg/dL — ABNORMAL HIGH (ref <150)

## 2012-08-01 LAB — TSH: TSH: 3.899 u[IU]/mL (ref 0.350–4.500)

## 2012-08-01 LAB — HEMOGLOBIN A1C: Mean Plasma Glucose: 108 mg/dL (ref <117)

## 2012-08-01 LAB — APTT: aPTT: 36 seconds (ref 24–37)

## 2012-08-01 SURGERY — LEFT HEART CATHETERIZATION WITH CORONARY ANGIOGRAM
Anesthesia: LOCAL

## 2012-08-01 MED ORDER — NITROGLYCERIN 0.4 MG SL SUBL: 0.4000 mg | SUBLINGUAL_TABLET | SUBLINGUAL | Status: DC | PRN

## 2012-08-01 MED ORDER — SODIUM CHLORIDE 0.9 % IJ SOLN
3.0000 mL | Freq: Two times a day (BID) | INTRAMUSCULAR | Status: DC
Start: 2012-08-01 — End: 2012-08-01

## 2012-08-01 MED ORDER — INFLUENZA VIRUS VACC SPLIT PF IM SUSP
0.5000 mL | INTRAMUSCULAR | Status: AC
  Administered 2012-08-02: 10:00:00 0.5 mL via INTRAMUSCULAR
  Filled 2012-08-01: qty 0.5

## 2012-08-01 MED ORDER — ACETAMINOPHEN 325 MG PO TABS: 650.0000 mg | ORAL_TABLET | ORAL | Status: DC | PRN

## 2012-08-01 MED ORDER — SODIUM CHLORIDE 0.9 % IJ SOLN: 3.0000 mL | INTRAMUSCULAR | Status: DC | PRN

## 2012-08-01 MED ORDER — MIDAZOLAM HCL 2 MG/2ML IJ SOLN
INTRAMUSCULAR | Status: AC
  Filled 2012-08-01: qty 2

## 2012-08-01 MED ORDER — BUDESONIDE 3 MG PO CP24
9.0000 mg | ORAL_CAPSULE | ORAL | Status: DC
  Administered 2012-08-02: 10:00:00 9 mg via ORAL
  Filled 2012-08-01 (×3): qty 3

## 2012-08-01 MED ORDER — SODIUM CHLORIDE 0.9 % IV SOLN: 250.0000 mL | INTRAVENOUS | Status: DC

## 2012-08-01 MED ORDER — FENTANYL CITRATE 0.05 MG/ML IJ SOLN
INTRAMUSCULAR | Status: AC
  Filled 2012-08-01: qty 2

## 2012-08-01 MED ORDER — VERAPAMIL HCL 2.5 MG/ML IV SOLN
INTRAVENOUS | Status: AC
  Filled 2012-08-01: qty 2

## 2012-08-01 MED ORDER — SODIUM CHLORIDE 0.9 % IV SOLN
INTRAVENOUS | Status: DC
  Administered 2012-08-01: 03:00:00 via INTRAVENOUS

## 2012-08-01 MED ORDER — HEPARIN (PORCINE) IN NACL 2-0.9 UNIT/ML-% IJ SOLN
INTRAMUSCULAR | Status: AC
  Filled 2012-08-01: qty 1000

## 2012-08-01 MED ORDER — ASPIRIN 81 MG PO CHEW: 324.0000 mg | CHEWABLE_TABLET | ORAL | Status: DC

## 2012-08-01 MED ORDER — CITALOPRAM HYDROBROMIDE 40 MG PO TABS
40.0000 mg | ORAL_TABLET | Freq: Every morning | ORAL | Status: DC
  Administered 2012-08-01 – 2012-08-02 (×2): 40 mg via ORAL
  Filled 2012-08-01 (×2): qty 1

## 2012-08-01 MED ORDER — HEPARIN SODIUM (PORCINE) 1000 UNIT/ML IJ SOLN
INTRAMUSCULAR | Status: AC
  Filled 2012-08-01: qty 1

## 2012-08-01 MED ORDER — VALACYCLOVIR HCL 500 MG PO TABS
500.0000 mg | ORAL_TABLET | Freq: Every day | ORAL | Status: DC
  Administered 2012-08-01 – 2012-08-02 (×2): 500 mg via ORAL
  Filled 2012-08-01 (×3): qty 1

## 2012-08-01 MED ORDER — ZOLPIDEM TARTRATE 5 MG PO TABS: 5.0000 mg | ORAL_TABLET | Freq: Every evening | ORAL | Status: DC | PRN

## 2012-08-01 MED ORDER — METOPROLOL SUCCINATE ER 50 MG PO TB24
50.0000 mg | ORAL_TABLET | Freq: Every day | ORAL | Status: DC
  Administered 2012-08-01: 22:00:00 50 mg via ORAL
  Filled 2012-08-01 (×4): qty 1

## 2012-08-01 MED ORDER — NITROGLYCERIN 0.2 MG/ML ON CALL CATH LAB
INTRAVENOUS | Status: AC
  Filled 2012-08-01: qty 1

## 2012-08-01 MED ORDER — ONDANSETRON HCL 4 MG/2ML IJ SOLN
4.0000 mg | Freq: Four times a day (QID) | INTRAMUSCULAR | Status: DC | PRN
  Administered 2012-08-01: 4 mg via INTRAVENOUS

## 2012-08-01 MED ORDER — SODIUM CHLORIDE 0.9 % IJ SOLN: 3.0000 mL | Freq: Two times a day (BID) | INTRAMUSCULAR | Status: DC

## 2012-08-01 MED ORDER — ACETAMINOPHEN 325 MG PO TABS
650.0000 mg | ORAL_TABLET | ORAL | Status: DC | PRN
  Filled 2012-08-01: qty 2

## 2012-08-01 MED ORDER — ONDANSETRON HCL 4 MG/2ML IJ SOLN: 4.0000 mg | Freq: Four times a day (QID) | INTRAMUSCULAR | Status: DC | PRN

## 2012-08-01 MED ORDER — ONDANSETRON HCL 4 MG/2ML IJ SOLN
INTRAMUSCULAR | Status: AC
  Filled 2012-08-01: qty 2

## 2012-08-01 MED ORDER — OXYCODONE-ACETAMINOPHEN 5-325 MG PO TABS
1.0000 | ORAL_TABLET | Freq: Four times a day (QID) | ORAL | Status: DC | PRN
  Administered 2012-08-01 – 2012-08-02 (×3): 1 via ORAL
  Filled 2012-08-01 (×3): qty 1

## 2012-08-01 MED ORDER — SODIUM CHLORIDE 0.9 % IV SOLN
1.0000 mL/kg/h | INTRAVENOUS | Status: AC
  Administered 2012-08-01: 1 mL/kg/h via INTRAVENOUS

## 2012-08-01 MED ORDER — SODIUM CHLORIDE 0.9 % IV SOLN: 250.0000 mL | INTRAVENOUS | Status: DC | PRN

## 2012-08-01 MED ORDER — DIAZEPAM 5 MG PO TABS
5.0000 mg | ORAL_TABLET | ORAL | Status: AC
  Administered 2012-08-01: 5 mg via ORAL
  Filled 2012-08-01: qty 1

## 2012-08-01 MED ORDER — MORPHINE SULFATE 2 MG/ML IJ SOLN
INTRAMUSCULAR | Status: AC
  Administered 2012-08-01: 2 mg via INTRAVENOUS
  Filled 2012-08-01: qty 1

## 2012-08-01 MED ORDER — LIDOCAINE HCL (PF) 1 % IJ SOLN
INTRAMUSCULAR | Status: AC
  Filled 2012-08-01: qty 30

## 2012-08-01 MED ORDER — ASPIRIN 300 MG RE SUPP
300.0000 mg | RECTAL | Status: DC
  Filled 2012-08-01: qty 1

## 2012-08-01 MED ORDER — AMITRIPTYLINE HCL 50 MG PO TABS
50.0000 mg | ORAL_TABLET | Freq: Every day | ORAL | Status: DC
  Administered 2012-08-01: 22:00:00 50 mg via ORAL
  Filled 2012-08-01 (×3): qty 1

## 2012-08-01 MED ORDER — ASPIRIN EC 81 MG PO TBEC
81.0000 mg | DELAYED_RELEASE_TABLET | Freq: Every day | ORAL | Status: DC
  Administered 2012-08-02: 81 mg via ORAL
  Filled 2012-08-01: qty 1

## 2012-08-01 MED ORDER — ATORVASTATIN CALCIUM 40 MG PO TABS
40.0000 mg | ORAL_TABLET | Freq: Every day | ORAL | Status: DC
  Filled 2012-08-01: qty 1

## 2012-08-01 MED ORDER — ONDANSETRON HCL 4 MG/2ML IJ SOLN
INTRAMUSCULAR | Status: AC
  Administered 2012-08-01: 4 mg
  Filled 2012-08-01: qty 2

## 2012-08-01 MED ORDER — SODIUM CHLORIDE 0.9 % IV SOLN: INTRAVENOUS | Status: DC

## 2012-08-01 MED ORDER — LORAZEPAM 0.5 MG PO TABS
1.0000 mg | ORAL_TABLET | Freq: Three times a day (TID) | ORAL | Status: DC
  Administered 2012-08-01 – 2012-08-02 (×3): 1 mg via ORAL
  Filled 2012-08-01 (×5): qty 1

## 2012-08-01 NOTE — ED Notes (Signed)
Cardiologist at bedside.  

## 2012-08-01 NOTE — Progress Notes (Signed)
Pt was seen in office yesterday PM for f/u of Crohns disease and was having SOB and SS chest pain. Improved in office with O2. Will be available if needed regarding her crohns dis.

## 2012-08-01 NOTE — H&P (Signed)
Cardiology H&P  Primary Care Povider: Ria Bush, MD Primary Cardiologist: none   HPI: Sharon [name] is a 56 y.o.female with Crohn's disease, tobacco abuse and HTN who presents to the ED with chest pain.  She reports intermittent chest pain at rest as well as exertional SOB over the last month. She attributed this pain to GERD.  Today she was in the doctor's office and felt she was having a panic attack.  It started similar to how it normally does but the chest pain was worse and did not go away.  She was brought to the ED and given ASA and lovenox 46m SQ.  She also received SL nitro x 1 with mild relief.  After 218mIV morphine pain was completely gone.  She has no EKG changes.  Her second troponin was found to be mildly elevated at 0.30.  She reports a large amount of stress in her life currently and she as particularly stressed today with issues at work.  She has not had a hisotry of CAD .  Her risk factors include tobacco abuse, HTN, and prior TIA.  PMH:  1. Crohn's Disease: followed by DR. [facility], on remicade and budesonide 2. Migraine 3. HTN 4. TIA vs complex migraine in 2011 5. Anxiety 6. Tobacco abuse 7. Hepatic Steatosis 8. Cervical Cancer s/p total hysterectomy/BSO 9. Pernicious anemia 10.  GERD 11. Appendectomy 12.   FH: CVA - mom       Crohn's daughter        CAD- brother unknown age  Social History:  reports that she has been smoking Cigarettes.  She has a 3.3 pack-year smoking history. She has never used smokeless tobacco. She reports that she does not drink alcohol or use illicit drugs.  Allergies:  Allergies  Allergen Reactions  . Codeine Nausea And Vomiting  . Ibuprofen Other (See Comments)    Pt has crohn's disease  . Remeron (Mirtazapine) Other (See Comments)    Knocked out   Medications reviewed  ROS: A full review of systems is obtained and is negative except as noted in the HPI.  Physical Exam: Blood pressure 136/87, pulse 73, temperature  98.2 F (36.8 C), temperature source Oral, resp. rate 15, SpO2 99.00%.  GENERAL: no acute distress.  EYES: Extra ocular movements are intact. There is no lid lag. Sclera is anicteric.  ENT: Oropharynx is clear. Dentition is within normal limits.  NECK: Supple. The thyroid is not enlarged.  LYMPH: There are no masses or lymphadenopathy present.  HEART: Regular rate and rhythm with no m/g/r.  Normal S1/S2. No JVD LUNGS: Clear to auscultation There are no rales, rhonchi, or wheezes.  ABDOMEN: Soft, non-tender, and non-distended with normoactive bowel sounds. There is no hepatosplenomegaly.  EXTREMITIES: No clubbing, cyanosis, or edema.  PULSES: DP/PT pulses were +2 and equal bilaterally.  SKIN: Warm, dry, and intact.  NEUROLOGIC: The patient was oriented to person, place, and time. No overt neurologic deficits were detected.  PSYCH: Normal judgment and insight, mood is appropriate.   Results: Results for orders placed during the hospital encounter of 07/31/12 (from the past 24 hour(s))  CBC     Status: Abnormal   Collection Time   07/31/12  5:49 PM      Component Value Range   WBC 11.4 (*) 4.0 - 10.5 K/uL   RBC 4.41  3.87 - 5.11 MIL/uL   Hemoglobin 13.5  12.0 - 15.0 g/dL   HCT 39.4  36.0 - 46.0 %  MCV 89.3  78.0 - 100.0 fL   MCH 30.6  26.0 - 34.0 pg   MCHC 34.3  30.0 - 36.0 g/dL   RDW 13.2  11.5 - 15.5 %   Platelets 245  150 - 400 K/uL  COMPREHENSIVE METABOLIC PANEL     Status: Abnormal   Collection Time   07/31/12  5:49 PM      Component Value Range   Sodium 139  135 - 145 mEq/L   Potassium 3.5  3.5 - 5.1 mEq/L   Chloride 102  96 - 112 mEq/L   CO2 24  19 - 32 mEq/L   Glucose, Bld 100 (*) 70 - 99 mg/dL   BUN 8  6 - 23 mg/dL   Creatinine, Ser 0.75  0.50 - 1.10 mg/dL   Calcium 9.5  8.4 - 10.5 mg/dL   Total Protein 7.7  6.0 - 8.3 g/dL   Albumin 3.8  3.5 - 5.2 g/dL   AST 26  0 - 37 U/L   ALT 25  0 - 35 U/L   Alkaline Phosphatase 106  39 - 117 U/L   Total Bilirubin 0.3  0.3 -  1.2 mg/dL   GFR calc non Af Amer >90  >90 mL/min   GFR calc Af Amer >90  >90 mL/min  POCT I-STAT TROPONIN I     Status: Normal   Collection Time   07/31/12  6:05 PM      Component Value Range   Troponin i, poc 0.00  0.00 - 0.08 ng/mL   Comment 3           POCT I-STAT, CHEM 8     Status: Normal   Collection Time   07/31/12  6:06 PM      Component Value Range   Sodium 142  135 - 145 mEq/L   Potassium 3.5  3.5 - 5.1 mEq/L   Chloride 107  96 - 112 mEq/L   BUN 7  6 - 23 mg/dL   Creatinine, Ser 0.90  0.50 - 1.10 mg/dL   Glucose, Bld 97  70 - 99 mg/dL   Calcium, Ion 1.14  1.12 - 1.23 mmol/L   TCO2 23  0 - 100 mmol/L   Hemoglobin 14.3  12.0 - 15.0 g/dL   HCT 42.0  36.0 - 46.0 %  URINALYSIS, ROUTINE W REFLEX MICROSCOPIC     Status: Abnormal   Collection Time   07/31/12  6:47 PM      Component Value Range   Color, Urine YELLOW  YELLOW   APPearance CLEAR  CLEAR   Specific Gravity, Urine 1.006  1.005 - 1.030   pH 8.0  5.0 - 8.0   Glucose, UA NEGATIVE  NEGATIVE mg/dL   Hgb urine dipstick TRACE (*) NEGATIVE   Bilirubin Urine NEGATIVE  NEGATIVE   Ketones, ur NEGATIVE  NEGATIVE mg/dL   Protein, ur NEGATIVE  NEGATIVE mg/dL   Urobilinogen, UA 0.2  0.0 - 1.0 mg/dL   Nitrite NEGATIVE  NEGATIVE   Leukocytes, UA MODERATE (*) NEGATIVE  URINE MICROSCOPIC-ADD ON     Status: Abnormal   Collection Time   07/31/12  6:47 PM      Component Value Range   Squamous Epithelial / LPF FEW (*) RARE   WBC, UA 3-6  <3 WBC/hpf   RBC / HPF 0-2  <3 RBC/hpf   Bacteria, UA RARE  RARE  TROPONIN I     Status: Abnormal   Collection Time   07/31/12  7:59 PM      Component Value Range   Troponin I 0.30 (*) <0.30 ng/mL    EKG: NSR without STT changes CXR: clear  Assessment/Plan: 56 yo WF with multiple risk factors for CAD here with acute coronary syndrome and minimally elevated troponin. Symptoms somewhat atypical and might also be suggestive of Takotsubo given age and life stressors. 1. Chest Pain/ACS -  Aspirin  - given lovenox 101m SQ just before midnight.  Additional dose has not been ordered for tomorrow yet. - continue toprol xl  - check lipids and start statin - follow cardiac markers, if continue to trend up will need LHC tomrorow 2. HTN: currently controlled - continue home meds 3. Anxiety: - continue home meds 4. Crohns: - continue budesonide - getting monthly remicade as well 5. FEN: NS 75cc/hr tonight while NPO   Sharon [name] 08/01/2012, 12:52 AM

## 2012-08-01 NOTE — Progress Notes (Signed)
    SUBJECTIVE: Mild chest discomfort this am but mostly resolved. No SOB this am.   BP 107/70  Pulse 73  Temp 98.4 F (36.9 C) (Oral)  Resp 16  Ht 5' 1.5" (1.562 m)  Wt 159 lb 4.8 oz (72.258 kg)  BMI 29.61 kg/m2  SpO2 94% No intake or output data in the 24 hours ending 08/01/12 0820  PHYSICAL EXAM General: Well developed, well nourished, in no acute distress. Alert and oriented x 3.  Psych:  Good affect, responds appropriately Neck: No JVD. No masses noted.  Lungs: Clear bilaterally with no wheezes or rhonci noted.  Heart: RRR with no murmurs noted. Abdomen: Bowel sounds are present. Soft, non-tender.  Extremities: No lower extremity edema.   LABS: Basic Metabolic Panel:  Basename 08/01/12 0335 07/31/12 1806 07/31/12 1749  NA 137 142 --  K 3.7 3.5 --  CL 102 107 --  CO2 23 -- 24  GLUCOSE 92 97 --  BUN 9 7 --  CREATININE 0.72 0.90 --  CALCIUM 9.0 -- 9.5  MG -- -- --  PHOS -- -- --   CBC:  Basename 08/01/12 0335 07/31/12 1806 07/31/12 1749  WBC 11.2* -- 11.4*  NEUTROABS -- -- --  HGB 13.4 14.3 --  HCT 39.8 42.0 --  MCV 90.2 -- 89.3  PLT 236 -- 245   Cardiac Enzymes:  Basename 08/01/12 0335 07/31/12 1959  CKTOTAL -- --  CKMB -- --  CKMBINDEX -- --  TROPONINI <0.30 0.30*   Fasting Lipid Panel:  Basename 08/01/12 0335  CHOL 206*  HDL 61  LDLCALC 111*  TRIG 171*  CHOLHDL 3.4  LDLDIRECT --    Current Meds:    . amitriptyline  50 mg Oral QHS  . aspirin  324 mg Oral NOW   Or  . aspirin  300 mg Rectal NOW  . aspirin EC  81 mg Oral Daily  . atorvastatin  40 mg Oral q1800  . budesonide  9 mg Oral BH-q7a  . citalopram  40 mg Oral q morning - 10a  . [COMPLETED] enoxaparin  70 mg Subcutaneous Once  . LORazepam  1 mg Oral TID  . metoprolol succinate  50 mg Oral QHS  . [COMPLETED] morphine      . [COMPLETED] ondansetron      . valACYclovir  500 mg Oral Daily  . [DISCONTINUED] nitroGLYCERIN  1 inch Topical Q6H     ASSESSMENT AND PLAN:  56 yo WF  with multiple risk factors for CAD here with chest pain, possible acute coronary syndrome and minimally elevated troponin.   1. Chest Pain/ACS: Clinical picture concerning for unstable angina. Troponin was 0.30 yesterday, less than 0.30 today. Given her risk factors, presentation with chest pain with worsening pain and SOB with exertion over the last month,  I agree with the plan for cath today to exclude obstructive CAD. Discussed with patient. Risks and benefits reviewed. Will d/c Lovenox this am. (last dose last night). Labs ok this am. Will replace potassium. Clear liquid breakfast.  2. HTN: currently controlled. Continue home meds    MCALHANY,CHRISTOPHER  12/4/20138:20 AM

## 2012-08-01 NOTE — Progress Notes (Signed)
TR BAND REMOVAL  LOCATION:    right radial  DEFLATED PER PROTOCOL:    yes  TIME BAND OFF / DRESSING APPLIED:    1900   SITE UPON ARRIVAL:    Level 0  SITE AFTER BAND REMOVAL:    Level 0  REVERSE ALLEN'S TEST:     positive  CIRCULATION SENSATION AND MOVEMENT:    Within Normal Limits   yes  COMMENTS:   Tolerated procedure well

## 2012-08-01 NOTE — Progress Notes (Signed)
Utilization review completed.  

## 2012-08-01 NOTE — CV Procedure (Signed)
   Cardiac Catheterization Procedure Note  Name: Sharon [name] MRN: [id number] DOB: 1956-03-15  Procedure: Left Heart Cath, Selective Coronary Angiography, LV angiography  Indication: Chest pain with borderline cardiac enzymes   Procedural Details: The right wrist was prepped, draped, and anesthetized with 1% lidocaine. Using the modified Seldinger technique, a 5 French sheath was introduced into the right radial artery. 3 mg of verapamil was administered through the sheath, weight-based unfractionated heparin was administered intravenously. Standard Judkins catheters were used for selective coronary angiography and left ventriculography. A 5 Fr MPA catheter was used for imaging of the LCA. Catheter exchanges were performed over an exchange length guidewire. There were no immediate procedural complications. A TR band was used for radial hemostasis at the completion of the procedure.  The patient was transferred to the post catheterization recovery area for further monitoring.  Procedural Findings: Hemodynamics: AO 113/67 LV 115/12  Coronary angiography: Coronary dominance: right  Left mainstem: Widely patent without obstructive disease  Left anterior descending (LAD): Widely patent to the distal anterior wall. First and second diags are patent.  Left circumflex (LCx): Patent with 2 OM branches and no obstructive disease  Right coronary artery (RCA): Large, dominant vessel without obstructive disease. PDA and PLA branches are patent.  Left ventriculography: Left ventricular systolic function is vigorous, LVEF is estimated at 65%, there is no significant mitral regurgitation   Final Conclusions:   1. Widely patent coronary arteries 2. Normal LV function  Recommendations: Suspect noncardiac chest pain.  Sherren Mocha 08/01/2012, 4:38 PM

## 2012-08-02 ENCOUNTER — Encounter (HOSPITAL_COMMUNITY): Payer: Self-pay | Admitting: Cardiology

## 2012-08-02 ENCOUNTER — Telehealth: Payer: Self-pay | Admitting: Family Medicine

## 2012-08-02 DIAGNOSIS — K219 Gastro-esophageal reflux disease without esophagitis: Secondary | ICD-10-CM

## 2012-08-02 DIAGNOSIS — R079 Chest pain, unspecified: Secondary | ICD-10-CM

## 2012-08-02 LAB — BASIC METABOLIC PANEL
CO2: 24 mEq/L (ref 19–32)
Chloride: 105 mEq/L (ref 96–112)
Creatinine, Ser: 0.81 mg/dL (ref 0.50–1.10)

## 2012-08-02 LAB — CBC
HCT: 36.3 % (ref 36.0–46.0)
MCV: 91.7 fL (ref 78.0–100.0)
RDW: 13.5 % (ref 11.5–15.5)
WBC: 7.1 10*3/uL (ref 4.0–10.5)

## 2012-08-02 MED ORDER — CLONAZEPAM 0.5 MG PO TABS: 0.5000 mg | ORAL_TABLET | Freq: Two times a day (BID) | ORAL | Status: DC | PRN

## 2012-08-02 NOTE — Telephone Encounter (Signed)
Noted pt discharged from hospital yesterday.  Normal coronaries, possibly GERD vs stress/anxiety related.  Can we call to see how she's doing post discharge and see if she needs hosp f/u scheduled within 2 wks?  Thanks.

## 2012-08-02 NOTE — Telephone Encounter (Signed)
Medication phoned to [facility] as instructed.Patient notified as instructed by telephone.

## 2012-08-02 NOTE — Progress Notes (Signed)
SUBJECTIVE: No chest pain or SOB. No events.   BP 112/60  Pulse 64  Temp 98.4 F (36.9 C) (Oral)  Resp 19  Ht 5' 1.5" (1.562 m)  Wt 161 lb 13.1 oz (73.4 kg)  BMI 30.08 kg/m2  SpO2 97%  Intake/Output Summary (Last 24 hours) at 08/02/12 0657 Last data filed at 08/02/12 0100  Gross per 24 hour  Intake 902.25 ml  Output      0 ml  Net 902.25 ml    PHYSICAL EXAM General: Well developed, well nourished, in no acute distress. Alert and oriented x 3.  Psych:  Good affect, responds appropriately Neck: No JVD. No masses noted.  Lungs: Clear bilaterally with no wheezes or rhonci noted.  Heart: RRR with no murmurs noted. Abdomen: Bowel sounds are present. Soft, non-tender.  Extremities: No lower extremity edema. Right wrist cath site ok.   LABS: Basic Metabolic Panel:  Basename 08/01/12 0335 07/31/12 1806 07/31/12 1749  NA 137 142 --  K 3.7 3.5 --  CL 102 107 --  CO2 23 -- 24  GLUCOSE 92 97 --  BUN 9 7 --  CREATININE 0.72 0.90 --  CALCIUM 9.0 -- 9.5  MG -- -- --  PHOS -- -- --   CBC:  Basename 08/01/12 0335 07/31/12 1806 07/31/12 1749  WBC 11.2* -- 11.4*  NEUTROABS -- -- --  HGB 13.4 14.3 --  HCT 39.8 42.0 --  MCV 90.2 -- 89.3  PLT 236 -- 245   Cardiac Enzymes:  Basename 08/01/12 1723 08/01/12 0920 08/01/12 0335  CKTOTAL -- -- --  CKMB -- -- --  CKMBINDEX -- -- --  TROPONINI <0.30 <0.30 <0.30   Fasting Lipid Panel:  Basename 08/01/12 0335  CHOL 206*  HDL 61  LDLCALC 111*  TRIG 171*  CHOLHDL 3.4  LDLDIRECT --    Current Meds:    . amitriptyline  50 mg Oral QHS  . aspirin EC  81 mg Oral Daily  . atorvastatin  40 mg Oral q1800  . budesonide  9 mg Oral BH-q7a  . citalopram  40 mg Oral q morning - 10a  . [COMPLETED] diazepam  5 mg Oral On Call  . [COMPLETED] fentaNYL      . [COMPLETED] heparin      . [COMPLETED] heparin      . influenza  inactive virus vaccine  0.5 mL Intramuscular Tomorrow-1000  . [COMPLETED] lidocaine      . LORazepam  1  mg Oral TID  . metoprolol succinate  50 mg Oral QHS  . [COMPLETED] midazolam      . [COMPLETED] midazolam      . [COMPLETED] midazolam      . [COMPLETED] nitroGLYCERIN      . valACYclovir  500 mg Oral Daily  . [COMPLETED] verapamil      . [DISCONTINUED] aspirin  324 mg Oral NOW  . [DISCONTINUED] aspirin  324 mg Oral Pre-Cath  . [DISCONTINUED] aspirin  300 mg Rectal NOW  . [DISCONTINUED] sodium chloride  3 mL Intravenous Q12H  . [DISCONTINUED] sodium chloride  3 mL Intravenous Q12H     ASSESSMENT AND PLAN:  1. Chest pain: Cardiac cath yesterday with normal coronary arteries. Normal LV function. Her chest pain is non-cardiac. Most likely related to GERD.   2. Dispo: D/C home today. She does not need cardiac follow up. She should follow up with her primary care doctor. Follow up on post cath BMET/CBC this am.  Kohner Orlick  12/5/20136:57 AM

## 2012-08-02 NOTE — Discharge Summary (Signed)
See full note this am. cdm

## 2012-08-02 NOTE — Telephone Encounter (Signed)
Ok to do - let's change from lorazepam 71m bid prn to klonopin 0.569mbid.  plz phone in new med and notify pt.

## 2012-08-02 NOTE — Discharge Summary (Signed)
Discharge Summary   Patient ID: Sharon [name] MRN: [id number], DOB/AGE: 54-09-1955 54 y.o.  Primary MD: Ria Bush, MD Primary Cardiologist: Dr. Angelena Form Admit date: 07/31/2012 D/C date:     08/02/2012      Primary Discharge Diagnoses:  1. Noncardiac Chest Pain  - Cath showed normal coronary arteries, nl LV function  - F/u w/ PCP  Secondary Discharge Diagnoses:  . HTN (hypertension)   . Migraines     and frequent other headaches (sinus or stress)  . Crohn's disease     h/o stenotic crohn's, active in TI, PPD neg ([facility] Dr. [name]); started cimzia, ?failure given flare despite treatment, consdering remicade  . Cervical cancer 12/2008    s/p hysterectomy  . History of chicken pox   . Anxiety and depression   . History of anemia     attributed to crohn's  . Arthritis   . Genital warts   . History of syphilis 1980s  . Tobacco abuse   . TIA (transient ischemic attack) 05/2010    at [facility] - TIA vs complex migraine (w/u negative - carotids, echo, TC doppler, and MRI normal)  . Hepatic steatosis 04/2011    diffuse on CT, 68m gallbladder polyp  . B12 deficiency     pernicious anemia - monthly b12 shots  . Scalp psoriasis   . GERD (gastroesophageal reflux disease)     severe, daily sxs if off PPI     Allergies Allergies  Allergen Reactions  . Codeine Nausea And Vomiting  . Ibuprofen Other (See Comments)    Pt has crohn's disease  . Remeron (Mirtazapine) Other (See Comments)    Knocked out    Diagnostic Studies/Procedures:   08/01/12 - Cardiac Cath Hemodynamics:  AO 113/67  LV 115/12  Coronary angiography:  Coronary dominance: right  Left mainstem: Widely patent without obstructive disease  Left anterior descending (LAD): Widely patent to the distal anterior wall. First and second diags are patent.  Left circumflex (LCx): Patent with 2 OM branches and no obstructive disease  Right coronary artery (RCA): Large, dominant vessel without obstructive  disease. PDA and PLA branches are patent.  Left ventriculography: Left ventricular systolic function is vigorous, LVEF is estimated at 65%, there is no significant mitral regurgitation  Final Conclusions:  1. Widely patent coronary arteries  2. Normal LV function  Recommendations: Suspect noncardiac chest pain   History of Present Illness: 56y.o. female w/ the above medical problems [facility] of [facility]  Hospital Course: In the ED, EKG revealed NSR with no acute ST/T changes. CXR was without acute cardiopulmonary abnormalities. Labs were significant for troponin 0.30, WBC 11.4, otherwise unremarkable CBC/CMET. She was admitted for further evaluation and treatment. Cardiac enzymes were cycled with subsequent normal troponin x3. Given her cardiac risk factors and symptoms concerning for unstable angina cardiac cath was performed which demonstrated normal coronary arteries and LV function. She tolerated the procedure well without complications. It was felt her chest pain was noncardiac in nature and possibly related to GERD and/or anxiety. She was able to ambulate without chest pain or sob. Cath site remained stable. She was seen and evaluated by Dr. MAngelena Formwho felt she was stable for discharge home with plans for follow up with his primary care provider.  Discharge Vitals: Blood pressure 120/64, pulse 69, temperature 98.1 F (36.7 C), temperature source Oral, resp. rate 17, height 5' 1.5" (1.562 m), weight 161 lb 13.1 oz (  73.4 kg), SpO2 100.00%.  Labs: Component Value Date   WBC 7.1 08/02/2012   HGB 12.1 08/02/2012   HCT 36.3 08/02/2012   MCV 91.7 08/02/2012   PLT 192 08/02/2012    Lab 08/02/12 0830 07/31/12 1749  NA 139 --  K 4.0 --  CL 105 --  CO2 24 --  BUN 8 --  CREATININE 0.81 --  CALCIUM 8.8 --  PROT -- 7.7  BILITOT -- 0.3  ALKPHOS -- 106  ALT -- 25  AST -- 26  GLUCOSE 88 --   Basename 08/01/12 1723 08/01/12 0920  08/01/12 0335 07/31/12 1959  TROPONINI <0.30 <0.30 <0.30 0.30*   Component Value Date   CHOL 206* 08/01/2012   HDL 61 08/01/2012   LDLCALC 111* 08/01/2012   TRIG 171* 08/01/2012    Discharge Medications     Medication List     As of 08/02/2012 10:11 AM    TAKE these medications         acetaminophen 500 MG tablet   Commonly known as: TYLENOL   Take 1,000 mg by mouth every 6 (six) hours as needed. For pain      amitriptyline 50 MG tablet   Commonly known as: ELAVIL   Take 50 mg by mouth at bedtime.      citalopram 40 MG tablet   Commonly known as: CELEXA   Take 40 mg by mouth every morning.      clonazePAM 0.5 MG tablet   Commonly known as: KLONOPIN   Take 1 tablet (0.5 mg total) by mouth 2 (two) times daily as needed for anxiety.      cyanocobalamin 1000 MCG/ML injection   Commonly known as: (VITAMIN B-12)   Inject 1,000 mcg into the muscle every 30 (thirty) days. Last dose 01/13/2012.      ENTOCORT EC 3 MG 24 hr capsule   Generic drug: budesonide   Take 9 mg by mouth every morning.      LORazepam 1 MG tablet   Commonly known as: ATIVAN   Take 1 mg by mouth 3 (three) times daily.      metoprolol succinate 50 MG 24 hr tablet   Commonly known as: TOPROL-XL   Take 50 mg by mouth at bedtime.      ondansetron 4 MG tablet   Commonly known as: ZOFRAN   Take 4 mg by mouth every 8 (eight) hours as needed. For nausea/vomiting      oxyCODONE-acetaminophen 5-325 MG per tablet   Commonly known as: PERCOCET/ROXICET   Take 1-2 tablets by mouth every 6 (six) hours as needed. for pain      REMICADE 100 MG injection   Generic drug: inFLIXimab   Inject 100 mg into the vein every 8 (eight) weeks.      valACYclovir 500 MG tablet   Commonly known as: VALTREX   Take 500 mg by mouth daily.      zolpidem 5 MG tablet   Commonly known as: AMBIEN   Take 5 mg by mouth at bedtime as needed. For sleep          Disposition   Discharge Orders    Future Appointments: Provider:  Department: Dept Phone: Center:   08/10/2012 9:30 AM [facility] [place] [facility]   08/27/2012 8:00 AM [facility] [place] [phone] None     Future Orders Please Complete By Expires   Diet - low sodium heart healthy  Increase activity slowly      Discharge instructions      Comments:   * KEEP WRIST CATHETERIZATION SITE CLEAN AND DRY. Call the office for any signs of bleeding, pus, swelling, increased pain, or any other concerns. * NO HEAVY LIFTING (>10lbs) OR SEXUAL ACTIVITY X 7 DAYS. * NO DRIVING X 2-3 DAYS. * NO SOAKING BATHS, HOT TUBS, POOLS, ETC., X 7 DAYS.  * There were no findings to suggest your chest pain is coming from your heart. Please follow up with your primary care provider and stop smoking!     Follow-up Information    Follow up with Ria Bush, MD. (As needed)    Contact information:   [place] [address] [phone]           Outstanding Labs/Studies:  None  Duration of Discharge Encounter: Greater than 30 minutes including physician and PA time.  Signed, Zhane Donlan PA-C 08/02/2012, 10:11 AM

## 2012-08-03 NOTE — Telephone Encounter (Signed)
Spoke with patient. She said she is doing much better since dc and since starting klonopin. She said she could tell a world of difference with that med. Follow up scheduled for next week.

## 2012-08-03 NOTE — Telephone Encounter (Signed)
Will see then. Thanks.

## 2012-08-08 ENCOUNTER — Ambulatory Visit (INDEPENDENT_AMBULATORY_CARE_PROVIDER_SITE_OTHER): Payer: BC Managed Care – PPO | Admitting: Family Medicine

## 2012-08-08 ENCOUNTER — Encounter: Payer: Self-pay | Admitting: Family Medicine

## 2012-08-08 VITALS — BP 130/84 | HR 72 | Temp 97.7°F | Wt 164.2 lb

## 2012-08-08 DIAGNOSIS — I5181 Takotsubo syndrome: Secondary | ICD-10-CM | POA: Insufficient documentation

## 2012-08-08 DIAGNOSIS — K5090 Crohn's disease, unspecified, without complications: Secondary | ICD-10-CM

## 2012-08-08 DIAGNOSIS — F419 Anxiety disorder, unspecified: Secondary | ICD-10-CM

## 2012-08-08 DIAGNOSIS — F32A Depression, unspecified: Secondary | ICD-10-CM

## 2012-08-08 DIAGNOSIS — E538 Deficiency of other specified B group vitamins: Secondary | ICD-10-CM

## 2012-08-08 DIAGNOSIS — G43909 Migraine, unspecified, not intractable, without status migrainosus: Secondary | ICD-10-CM

## 2012-08-08 DIAGNOSIS — F341 Dysthymic disorder: Secondary | ICD-10-CM

## 2012-08-08 DIAGNOSIS — F329 Major depressive disorder, single episode, unspecified: Secondary | ICD-10-CM

## 2012-08-08 MED ORDER — CYANOCOBALAMIN 1000 MCG/ML IJ SOLN
1000.0000 ug | Freq: Once | INTRAMUSCULAR | Status: AC
  Administered 2012-08-08: 1000 ug via INTRAMUSCULAR

## 2012-08-08 NOTE — Assessment & Plan Note (Signed)
Monitor for now.  If increased frequency noted, consider increase in amitriptyline.

## 2012-08-08 NOTE — Addendum Note (Signed)
Addended by: Royann Shivers A on: 08/08/2012 08:45 AM   Modules accepted: Orders

## 2012-08-08 NOTE — Assessment & Plan Note (Signed)
b12 shot today.

## 2012-08-08 NOTE — Assessment & Plan Note (Signed)
Reviewed recent hospitalization records. Discussed this possible dx and importance if decreasing stress in life.  Pt has already decided to resign from work which is where most of her stress stems from.

## 2012-08-08 NOTE — Assessment & Plan Note (Signed)
Notes improvement with change from ativan to klonopin.  Discussed taking later in day - ie 10am.  To call me with update if not improved with this.

## 2012-08-08 NOTE — Patient Instructions (Addendum)
Push forward klonopin and let me know how that does. Let's keep an eye on headaches, let me know if becoming more frequent. Good to see you today, call us with quesitons. b12 shot today.

## 2012-08-08 NOTE — Progress Notes (Signed)
  Subjective:    Patient ID: Sharon [name], female    DOB: 07-Feb-1956, 56 y.o.   MRN: [id number]  HPI CC: hosp f/u  Zakeria was seen here late October with worsening work related stress and anxiety.  At that time, amitriptyline was increased to 48m at night time.  This did not significantly help, so we changed her prn ativan to klonopin.  Pt states this has significantly helped anxiety symptoms.  Finds at 3-4 pm notes increasing anxiety at that time.  Last night after lunch felt sick to stomach, nauseated.  Vomited x1 and afterwards had bad headache.  Took pain med and improved.  HA feel overall well controlled despite this.  Takes amitriptyline 556mnightly.  In interim, she was hospitalized 12/3-12/2011 for chest pain she endorsed while seeing her GI doctor for f/u Crohn's disease.  She underwent cardiac catheterization 2/2 minimally elevated troponins revealing overall normal coronary arteries and normal LV function.  Chest pain attributed to stress/anxiety (Takotsubo's?) and possibly GERD.  Work stress - most of her anxiety stems from situation at work.  Pt has decided to turn in resignation.  Will stop working in June 2013.  Already has felt better 2/2 this decision.  Wants to focus on schooling/studies.  Worried about insurance.  Crohn's - on entocort and remicade.  Crohn's seems to be well controlled currently.  [facility] Past Medical History  Diagnosis Date  . HTN (hypertension)   . Migraines     and frequent other headaches (sinus or stress)  . Crohn's disease     h/o stenotic crohn's, active in TI, PPD neg ([facility] Dr. EdOletta Lamas started cimzia, ?failure given flare despite treatment, consdering remicade  . Cervical cancer 12/2008    s/p hysterectomy  . History of chicken pox   . Anxiety and depression   . History of anemia     attributed to crohn's  . Arthritis   . Genital warts   . History of syphilis 1980s  . Tobacco abuse   . TIA (transient ischemic  attack) 05/2010    [facility] vs complex migraine (w/u negative - carotids, echo, TC doppler, and MRI normal)  . Hepatic steatosis 04/2011    diffuse on CT, 82m22mallbladder polyp  . B12 deficiency     pernicious anemia - monthly b12 shots  . Scalp psoriasis   . GERD (gastroesophageal reflux disease)     severe, daily sxs if off PPI  . Normal coronary arteries     cath 08/01/12, LVEF 65%     Review of Systems Per HPI    Objective:   Physical Exam  Nursing note and vitals reviewed. Constitutional: She appears well-developed and well-nourished. No distress.  HENT:  Head: Normocephalic and atraumatic.  Mouth/Throat: Oropharynx is clear and moist. No oropharyngeal exudate.       Raw under nose on left  Neck: Normal range of motion. Neck supple.  Cardiovascular: Normal rate, regular rhythm, normal heart sounds and intact distal pulses.   No murmur heard. Pulmonary/Chest: Effort normal and breath sounds normal. No respiratory distress. She has no wheezes. She has no rales.  Musculoskeletal: She exhibits no edema.  Psychiatric: She has a normal mood and affect.       Calm with recent decision to resign from current job.       Assessment & Plan:

## 2012-08-08 NOTE — Assessment & Plan Note (Signed)
Stable on entocort and remicade.  To f/u with GI.

## 2012-08-10 ENCOUNTER — Ambulatory Visit: Payer: BC Managed Care – PPO

## 2012-08-13 ENCOUNTER — Telehealth: Payer: Self-pay

## 2012-08-13 NOTE — Telephone Encounter (Signed)
Letter faxed and patient notified.

## 2012-08-13 NOTE — Telephone Encounter (Signed)
Pt left v/m needs release faxed to [facility] that pt can restart physical therapy for pt's arm. 2 weeks ago pt was in hospital for heart testing; Pt called cardiologist for release but was told need to get release from PCP.Please advise.

## 2012-08-13 NOTE — Telephone Encounter (Signed)
Letter written and placed in Sharon [name]'s box.  plz notify pt when sent in.

## 2012-08-15 ENCOUNTER — Encounter: Payer: BC Managed Care – PPO | Admitting: Family Medicine

## 2012-08-20 ENCOUNTER — Other Ambulatory Visit: Payer: Self-pay | Admitting: Family Medicine

## 2012-08-27 ENCOUNTER — Encounter (HOSPITAL_COMMUNITY)
Admission: RE | Admit: 2012-08-27 | Discharge: 2012-08-27 | Disposition: A | Discharge status: Home / Self Care | Payer: BC Managed Care – PPO | Source: Ambulatory Visit | Attending: Gastroenterology | Admitting: Gastroenterology

## 2012-08-27 DIAGNOSIS — I10 Essential (primary) hypertension: Secondary | ICD-10-CM | POA: Insufficient documentation

## 2012-08-27 DIAGNOSIS — F17200 Nicotine dependence, unspecified, uncomplicated: Secondary | ICD-10-CM | POA: Insufficient documentation

## 2012-08-27 DIAGNOSIS — G43909 Migraine, unspecified, not intractable, without status migrainosus: Secondary | ICD-10-CM | POA: Insufficient documentation

## 2012-08-27 DIAGNOSIS — F341 Dysthymic disorder: Secondary | ICD-10-CM | POA: Insufficient documentation

## 2012-08-27 DIAGNOSIS — Z8673 Personal history of transient ischemic attack (TIA), and cerebral infarction without residual deficits: Secondary | ICD-10-CM | POA: Insufficient documentation

## 2012-08-27 DIAGNOSIS — K5090 Crohn's disease, unspecified, without complications: Secondary | ICD-10-CM | POA: Insufficient documentation

## 2012-08-27 MED ORDER — METHYLPREDNISOLONE SODIUM SUCC 125 MG IJ SOLR
40.0000 mg | Freq: Once | INTRAMUSCULAR | Status: AC
  Administered 2012-08-27: 40 mg via INTRAVENOUS
  Filled 2012-08-27: qty 2

## 2012-08-27 MED ORDER — DIPHENHYDRAMINE HCL 25 MG PO TABS
25.0000 mg | ORAL_TABLET | Freq: Once | ORAL | Status: AC
  Administered 2012-08-27: 25 mg via ORAL
  Filled 2012-08-27 (×2): qty 1

## 2012-08-27 MED ORDER — SODIUM CHLORIDE 0.9 % IV SOLN
400.0000 mg | INTRAVENOUS | Status: DC
  Administered 2012-08-27: 400 mg via INTRAVENOUS
  Filled 2012-08-27: qty 40

## 2012-08-27 MED ORDER — SODIUM CHLORIDE 0.9 % IV SOLN
INTRAVENOUS | Status: DC
  Administered 2012-08-27: 09:00:00 via INTRAVENOUS

## 2012-08-31 NOTE — Telephone Encounter (Signed)
Pt left v/m that [facility] did not receive release. Pt request release faxed to [facility] dept at [phone].pt request call back when done.

## 2012-08-31 NOTE — Telephone Encounter (Signed)
Letter refaxed to [facility] as requested. Pt notified.

## 2012-09-12 ENCOUNTER — Telehealth: Payer: Self-pay

## 2012-09-12 NOTE — Telephone Encounter (Signed)
Pt left v/m that clonazepam is not working now;helped initially but not now. Pt request a different med  or to try lorazepam again at higher dose.[facility].Please advise.

## 2012-09-13 MED ORDER — CLONAZEPAM 0.5 MG PO TABS: ORAL_TABLET | ORAL | Status: DC

## 2012-09-13 NOTE — Telephone Encounter (Signed)
Patient advises that anxiety is worse in the morning and gradually gets worse throughout the day. She feels that she is exhibiting signs of an upcoming anxiety attack. I advised her of your instructions. How do you want me to call in the refill?

## 2012-09-13 NOTE — Telephone Encounter (Signed)
When is anxiety worse - am or night time? Let's try to increase clonazepam to 80m in am and continue 0.516mat night - to take 2 in am and 1 at night plz phone in refill.

## 2012-09-13 NOTE — Telephone Encounter (Signed)
Placed new med sig in chart.

## 2012-09-13 NOTE — Telephone Encounter (Signed)
Message left for patient to return my call.  

## 2012-09-14 NOTE — Telephone Encounter (Signed)
Rx called in as directed.   

## 2012-09-27 ENCOUNTER — Encounter: Payer: Self-pay | Admitting: Family Medicine

## 2012-10-11 IMAGING — CT CT ABD-PELV W/ CM
2 of 5 series · 13 of 32 positions shown, 18 images · IV contrast (water/omni  & 100ml omni 300)
Comparison: CT of the abdomen and pelvis performed 03/09/2011, and
abdominal ultrasound performed 05/24/2011

CLINICAL DATA: Stabbing abdominal pain; right lower quadrant and
left upper quadrant pain.  Nausea and vomiting.  Shortness of
breath, dizziness and headache.  History of Crohn's disease.

CT ABDOMEN AND PELVIS WITH CONTRAST
TECHNIQUE: Multidetector CT imaging of the abdomen and pelvis was
performed following the standard protocol during bolus
administration of intravenous contrast.
Contrast: 80mL OMNIPAQUE IOHEXOL 300 MG/ML IJ SOLN

[Series 2: routine abdomen · axial · 0.70mm/px · z∈[-423,-78]mm · 6 of 97 slices shown, 11 images]
[im 14/97  soft-tissue]
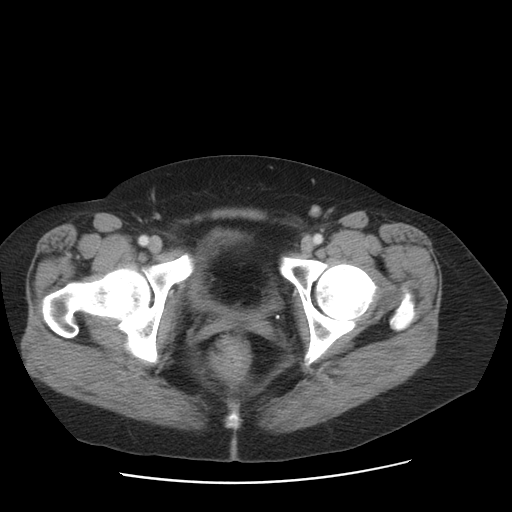
[im 14/97  bone]
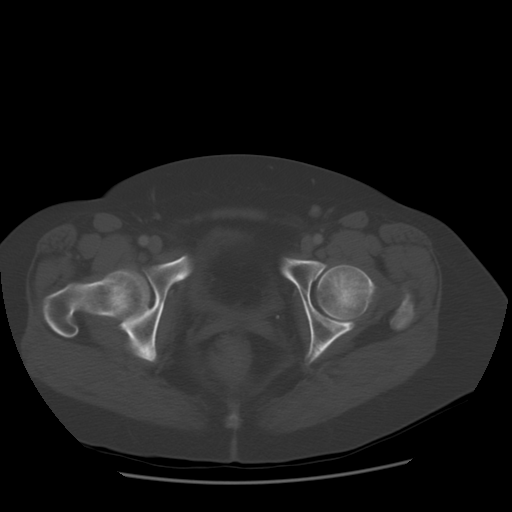
[im 28/97  soft-tissue]
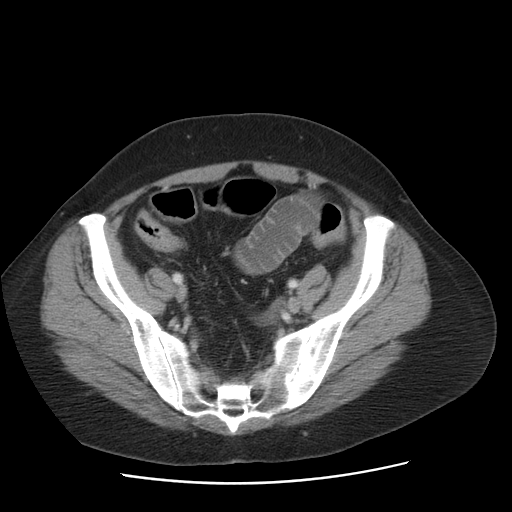
[im 42/97  soft-tissue]
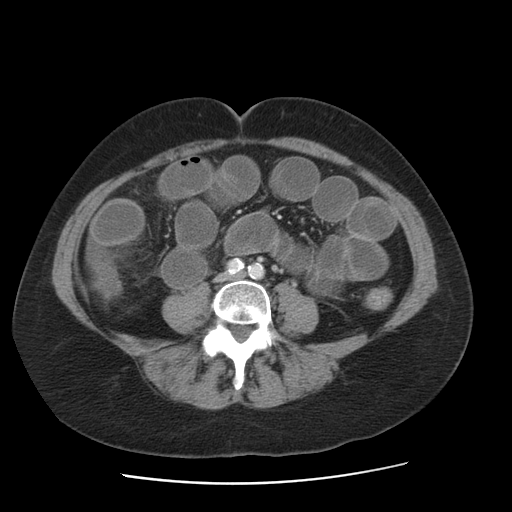
[im 42/97  lung]
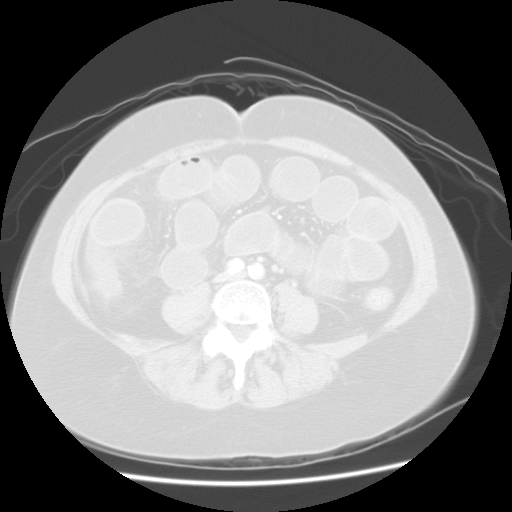
[im 55/97  soft-tissue]
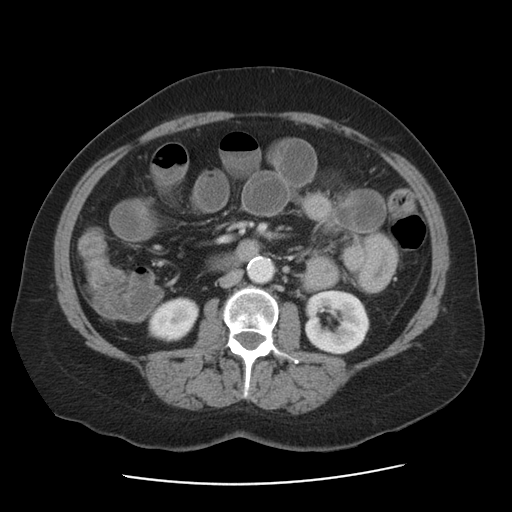
[im 55/97  lung]
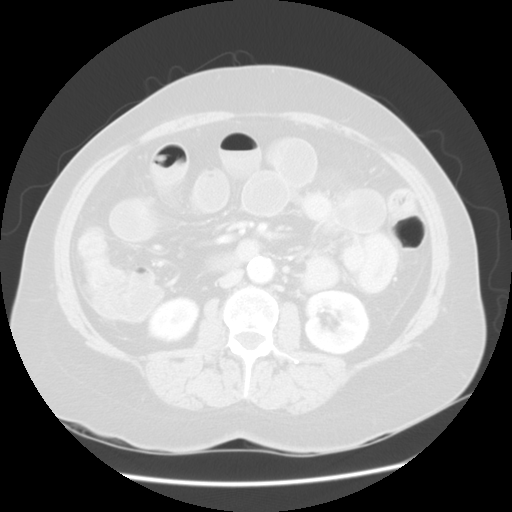
[im 69/97  soft-tissue]
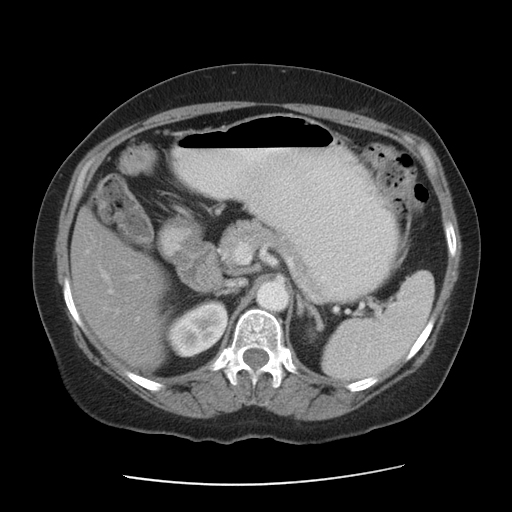
[im 69/97  lung]
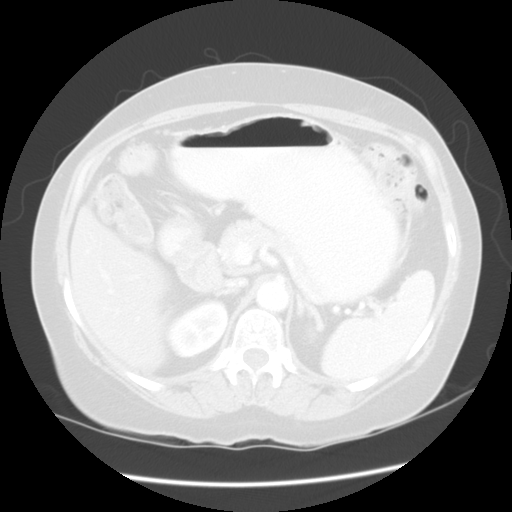
[im 83/97  soft-tissue]
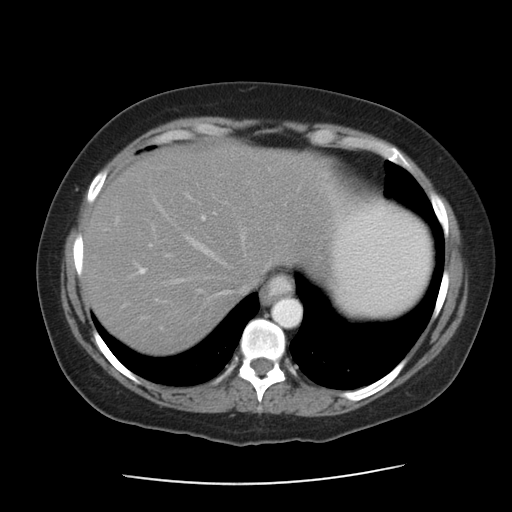
[im 83/97  lung]
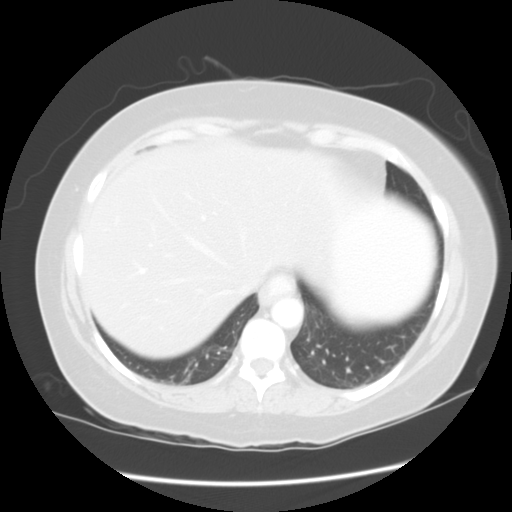

[Series 401: sag · sagittal · 0.96mm/px · 7 of 117 slices shown]
[im 13/117  soft-tissue]
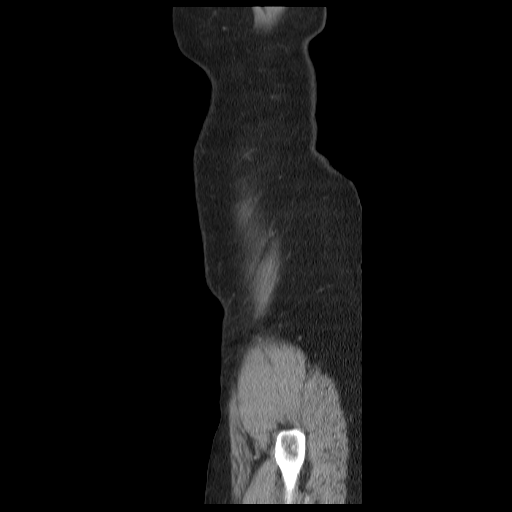
[im 26/117  soft-tissue]
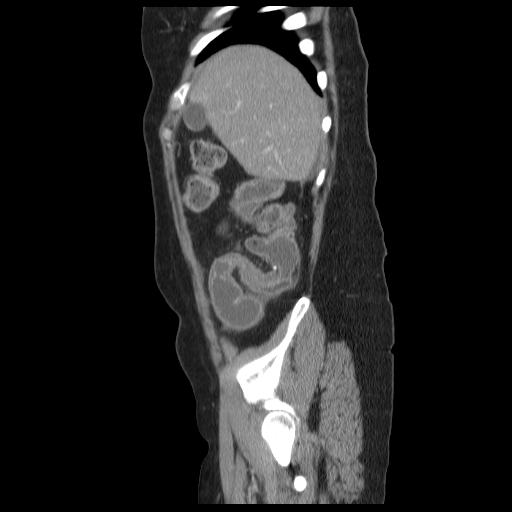
[im 39/117  soft-tissue]
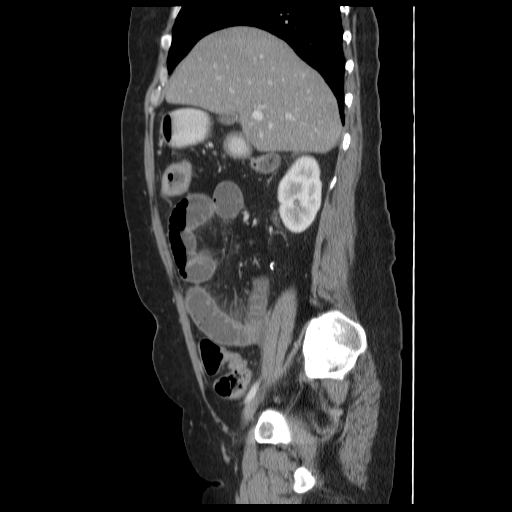
[im 52/117  soft-tissue]
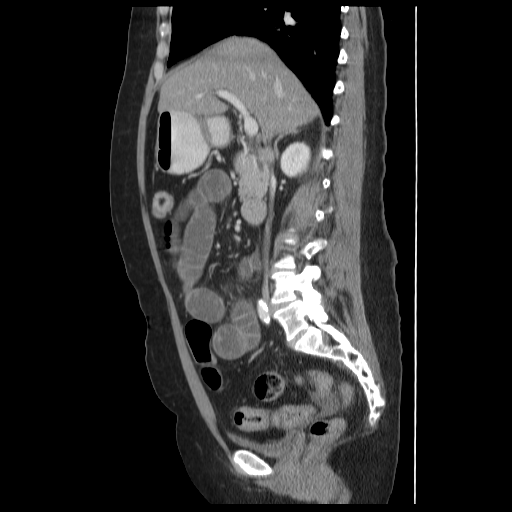
[im 65/117  soft-tissue]
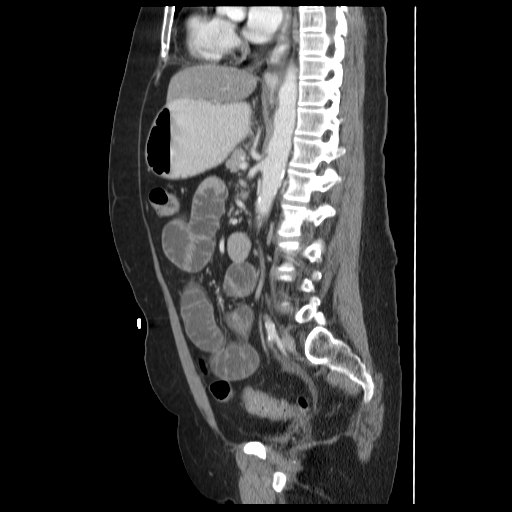
[im 78/117  soft-tissue]
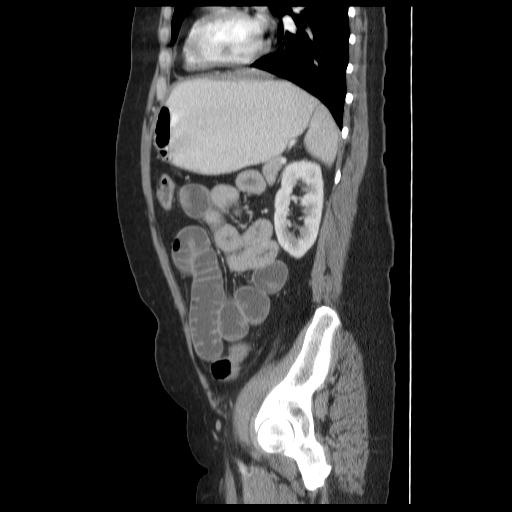
[im 91/117  soft-tissue]
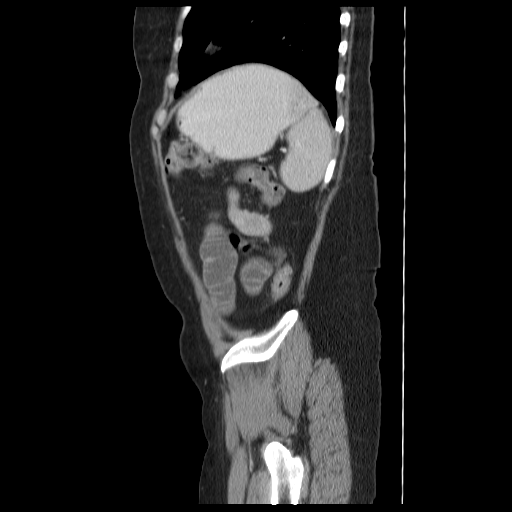

[13 of 32 positions shown; findings below may reference images not displayed]

FINDINGS: Minimal bibasilar atelectasis is noted.

The liver and spleen are unremarkable in appearance.  The
gallbladder is within normal limits.  The pancreas and adrenal
glands are unremarkable.

The kidneys are unremarkable in appearance.  There is no evidence
of hydronephrosis.  No renal or ureteral stones are seen.  No
perinephric stranding is appreciated.

There is mild diffuse inflammation about the mid to distal small
bowel, which is filled with fluid.  This is particularly prominent
along the remaining distal ileum, with associated wall thickening
along the distal ileum just proximal to the ileocolic anastomosis.
Trace associated free fluid is noted tracking about small bowel
loops.  The duodenum and the jejunum are grossly unremarkable in
appearance.  Findings are compatible with exacerbation of the
patient's Crohn's disease.

The stomach is filled with contrast and is within normal limits.
No acute vascular abnormalities are seen.  Scattered calcification
is noted along the abdominal aorta and its branches.

The patient is status post resection of the cecum and appendix.
The remaining colon is unremarkable in appearance.

The bladder is decompressed and not well assessed.  The patient is
status post hysterectomy.  No suspicious adnexal masses are seen.
Trace free fluid is noted within the pelvis.  No inguinal
lymphadenopathy is seen.

No acute osseous abnormalities are identified.
IMPRESSION: 1.  Mild diffuse inflammation about the mid to distal small bowel,
which is filled with fluid; this is particularly prominent along
the remaining distal ileum, with mild associated ileal wall
thickening.  Trace free fluid tracking about small bowel loops.
Findings compatible with exacerbation of the patient's Crohn's
disease.
2.  Scattered calcification along the abdominal aorta and its
branches.
3.  Ileocolic anastomosis grossly unremarkable in appearance.
4.  Trace free fluid noted within the pelvis.

## 2012-10-15 ENCOUNTER — Other Ambulatory Visit: Payer: Self-pay | Admitting: Gastroenterology

## 2012-10-15 HISTORY — PX: COLONOSCOPY: SHX174

## 2012-10-16 ENCOUNTER — Other Ambulatory Visit: Payer: Self-pay | Admitting: Gastroenterology

## 2012-10-16 DIAGNOSIS — K5090 Crohn's disease, unspecified, without complications: Secondary | ICD-10-CM

## 2012-10-19 ENCOUNTER — Other Ambulatory Visit: Payer: Self-pay | Admitting: Family Medicine

## 2012-10-19 ENCOUNTER — Telehealth: Payer: Self-pay

## 2012-10-19 DIAGNOSIS — Z1231 Encounter for screening mammogram for malignant neoplasm of breast: Secondary | ICD-10-CM

## 2012-10-19 NOTE — Telephone Encounter (Signed)
Pt's anxiety med clonazepam is relaxing pt a little bit but not really working well; does not take care of anxiety.  Pt said she could not take Clonazepam 1 mg in the morning at the same time because it made her sleepy. Pt takes 0.5 mg at 8 Am,    0.5 mg at 11 AM and 0.5 mg at bedtime. Pt would like new med called to [facility] and then f/u with  appt 10/23/12 at 9 am to discuss anxiety and sense of worry that pt has.Please advise.

## 2012-10-19 NOTE — Telephone Encounter (Signed)
Will discuss at [facility] next week.  No new meds sent in.

## 2012-10-22 ENCOUNTER — Encounter (HOSPITAL_COMMUNITY): Payer: BC Managed Care – PPO

## 2012-10-23 ENCOUNTER — Ambulatory Visit: Payer: BC Managed Care – PPO | Admitting: Family Medicine

## 2012-10-24 ENCOUNTER — Other Ambulatory Visit: Payer: Self-pay | Admitting: Family Medicine

## 2012-10-25 ENCOUNTER — Ambulatory Visit
Admission: RE | Admit: 2012-10-25 | Discharge: 2012-10-25 | Disposition: A | Discharge status: Home / Self Care | Payer: BC Managed Care – PPO | Source: Ambulatory Visit | Attending: Gastroenterology | Admitting: Gastroenterology

## 2012-10-25 DIAGNOSIS — K5090 Crohn's disease, unspecified, without complications: Secondary | ICD-10-CM

## 2012-10-25 MED ORDER — GADOBENATE DIMEGLUMINE 529 MG/ML IV SOLN
15.0000 mL | Freq: Once | INTRAVENOUS | Status: AC | PRN
  Administered 2012-10-25: 15 mL via INTRAVENOUS

## 2012-10-30 ENCOUNTER — Ambulatory Visit: Payer: BC Managed Care – PPO | Admitting: Family Medicine

## 2012-10-30 ENCOUNTER — Encounter: Payer: Self-pay | Admitting: Family Medicine

## 2012-11-01 ENCOUNTER — Encounter: Payer: Self-pay | Admitting: Family Medicine

## 2012-11-01 ENCOUNTER — Ambulatory Visit (INDEPENDENT_AMBULATORY_CARE_PROVIDER_SITE_OTHER): Payer: BC Managed Care – PPO | Admitting: Family Medicine

## 2012-11-01 VITALS — BP 134/76 | HR 104 | Temp 97.7°F | Wt 169.5 lb

## 2012-11-01 DIAGNOSIS — F341 Dysthymic disorder: Secondary | ICD-10-CM

## 2012-11-01 DIAGNOSIS — F329 Major depressive disorder, single episode, unspecified: Secondary | ICD-10-CM

## 2012-11-01 DIAGNOSIS — F419 Anxiety disorder, unspecified: Secondary | ICD-10-CM

## 2012-11-01 DIAGNOSIS — K5090 Crohn's disease, unspecified, without complications: Secondary | ICD-10-CM

## 2012-11-01 DIAGNOSIS — H60399 Other infective otitis externa, unspecified ear: Secondary | ICD-10-CM

## 2012-11-01 DIAGNOSIS — F32A Depression, unspecified: Secondary | ICD-10-CM

## 2012-11-01 DIAGNOSIS — F17200 Nicotine dependence, unspecified, uncomplicated: Secondary | ICD-10-CM

## 2012-11-01 DIAGNOSIS — H6091 Unspecified otitis externa, right ear: Secondary | ICD-10-CM

## 2012-11-01 DIAGNOSIS — E538 Deficiency of other specified B group vitamins: Secondary | ICD-10-CM

## 2012-11-01 MED ORDER — CIPROFLOXACIN-HYDROCORTISONE 0.2-1 % OT SUSP: 3.0000 [drp] | Freq: Two times a day (BID) | OTIC | Status: DC

## 2012-11-01 MED ORDER — CYANOCOBALAMIN 1000 MCG/ML IJ SOLN
1000.0000 ug | Freq: Once | INTRAMUSCULAR | Status: AC
  Administered 2012-11-01: 1000 ug via INTRAMUSCULAR

## 2012-11-01 MED ORDER — LORAZEPAM 1 MG PO TABS: 1.0000 mg | ORAL_TABLET | Freq: Three times a day (TID) | ORAL | Status: DC | PRN

## 2012-11-01 NOTE — Assessment & Plan Note (Signed)
Continue celexa 53m daily as well as amitriptyline 583mnightly (and for migraine ppx) Will try back on ativan at 66m55mid prn. Pt aware of risks of benzodiazepine use.

## 2012-11-01 NOTE — Patient Instructions (Addendum)
Let's change klonopin to lorazepam 67m three times daily as needed.  Try to space out this med. Continue celexa at 457mdaily , continue amitriptyline 5033maily. When weather gets nicer, start walking to help relieve stress. Good to see you today, call us [place] questions. for ear - use ear drops as prescribed.  If not better in next few days or any worsening, let me know.

## 2012-11-01 NOTE — Assessment & Plan Note (Signed)
b12 shot today.

## 2012-11-01 NOTE — Assessment & Plan Note (Signed)
Currently off meds. Desires referral to  GI to establish - not happy with [facility], however desires to first follow up with Dr. Koleen Distance at [facility].

## 2012-11-01 NOTE — Assessment & Plan Note (Signed)
Continue to encourage cessation.

## 2012-11-01 NOTE — Progress Notes (Signed)
Subjective:    Patient ID: Sharon [name], female    DOB: 06-25-1956, 57 y.o.   MRN: [id number]  HPI CC: anxiety  [name] was seen here late October with worsening work related stress and anxiety. At that time, amitriptyline was increased to 5m at night time. This did not significantly help, so we changed her prn ativan to klonopin. Initially significantly helped anxiety symptoms. Taking 0.529mtid.  However noted that this was causing excess sedation.  On celexa 4042maily as well.  Stays on edge.  Would like to change back to ativan.  Prior on 1mg41md.  Has gone to counseling in past - had 3-4 sessions with Dr. PerrRexene Edisond help in past but currently feels unable to afford.  Work stress - most of her anxiety stems from situation at work. Pt has decided to turn in resignation. Will stop working in June 2014. Already has felt better 2/2 this decision. Wants to focus on schooling/studies. Worried about insurance.  Crohn's - stopped remicade and entocort about 2 wks ago.  Staying bloated around abdomen.  Colonoscopy, MRI looking ok.  To see digestive specialist (Dr. Bloo[place]oleen Distance [facility] opinion.  [place] [facility] [place] remicade (caused rash), symzia, entocort and prednisone, 6-mercaptopurine.  Tired of side effects of biological agent - has decided not to take any medication for crohn's for now.  Smoking - 1/3 ppd.  Working on cutting back.  R earache - present for last 3 days.  occasional draining.  Dry skin.  + chills, congestion ,rhinorrhea and cough.  No fevers, tooth pain.  Wt Readings from Last 3 Encounters:  11/01/12 169 lb 8 oz (76.885 kg)  08/27/12 164 lb (74.39 kg)  08/08/12 164 lb 4 oz (74.503 kg)   Past Medical History  Diagnosis Date  . HTN (hypertension)   . Migraines     and frequent other headaches (sinus or stress)  . Crohn's disease     h/o stenotic crohn's, active in TI,  PPD neg ([facility] Dr. EdwaOletta Lamastarted cimzia, ?failure given flare despite treatment, now on remicade  . Cervical cancer 12/2008    s/p hysterectomy  . History of chicken pox   . Anxiety and depression   . History of anemia     attributed to crohn's  . Arthritis   . Genital warts   . History of syphilis 1980s  . Tobacco abuse   . [facility]    [facility] (w[facility]u negative - carotids, echo, TC doppler, and MRI normal)  . Hepatic steatosis 04/2011    diffuse on CT, 5mm 69mlbladder polyp  . B12 deficiency     pernicious anemia - monthly b12 shots  . Scalp psoriasis   . GERD (gastroesophageal reflux disease)     severe, daily sxs if off PPI  . Takotsubo syndrome     cath 08/01/12, normal coronaries, LVEF 65%    Past Surgical History  Procedure Laterality Date  . Appendectomy  1998  . Right colectomy  2000    crohn's disease, ileocecal resection  . Hospitalization  05/2010    TIA vs complex migraine - w/u negative - head CT, MRI/MRA, carotid dopplers, echo.  treated with ASA and tramadol  . [place] ec[place]ardiography  05/2010    nl LV fxn ,EF 60%  . Abd us  [id number]  diffuse hepatic steatosis, 25m polyp in gallbaldder fundus, no stones, mod abd ath  . Colonoscopy  10/2008    ileo-colonic anastomosis, ielocolonic crohn's  . Colonoscopy  05/2011    ileo-colonic anastomosis normal, normal mucosa  . Esophagogastroduodenoscopy  05/2011    nl esophagus, gastritis, nl duodenum  . Cervical biopsy  w/ loop electrode excision  10/2008  . Vaginal hysterectomy  12/2008    LAVH/BSO  . Cardiac catheterization  07/2012    normal LV fxn, widely patent coronaries  . Colonoscopy  10/15/2012    focal inflammation at anastomosis, no active crohn's, planning MR enterograph ([name]   Review of Systems Per HPI    Objective:   Physical Exam  Nursing note and vitals reviewed. Constitutional: She appears well-developed and well-nourished. No distress.   HENT:  Right Ear: Hearing and tympanic membrane normal.  Left Ear: Hearing, tympanic membrane, external ear and ear canal normal.  R pinna slightly erythematous, weeping, dry caked scales. Ear canal mildly edematous  Psychiatric: She has a normal mood and affect. Her behavior is normal. Judgment and thought content normal.  Calm, good eye contact, pleasant       Assessment & Plan:

## 2012-11-01 NOTE — Assessment & Plan Note (Signed)
No perf. Treat with cipro hc otic. Update if sxs persist or worsen.

## 2012-11-13 ENCOUNTER — Emergency Department (HOSPITAL_COMMUNITY)
Admission: EM | Admit: 2012-11-13 | Discharge: 2012-11-13 | Disposition: A | Discharge status: Home / Self Care | Payer: BC Managed Care – PPO | Attending: Emergency Medicine | Admitting: Emergency Medicine

## 2012-11-13 ENCOUNTER — Encounter: Payer: Self-pay | Admitting: Family Medicine

## 2012-11-13 ENCOUNTER — Ambulatory Visit: Payer: BC Managed Care – PPO

## 2012-11-13 ENCOUNTER — Ambulatory Visit (INDEPENDENT_AMBULATORY_CARE_PROVIDER_SITE_OTHER): Payer: BC Managed Care – PPO | Admitting: Family Medicine

## 2012-11-13 ENCOUNTER — Emergency Department (HOSPITAL_COMMUNITY): Payer: BC Managed Care – PPO

## 2012-11-13 ENCOUNTER — Encounter (HOSPITAL_COMMUNITY): Payer: Self-pay

## 2012-11-13 ENCOUNTER — Telehealth: Payer: Self-pay | Admitting: Family Medicine

## 2012-11-13 VITALS — BP 126/90 | HR 80 | Temp 98.1°F

## 2012-11-13 DIAGNOSIS — Z8739 Personal history of other diseases of the musculoskeletal system and connective tissue: Secondary | ICD-10-CM | POA: Insufficient documentation

## 2012-11-13 DIAGNOSIS — I10 Essential (primary) hypertension: Secondary | ICD-10-CM | POA: Insufficient documentation

## 2012-11-13 DIAGNOSIS — M25561 Pain in right knee: Secondary | ICD-10-CM | POA: Insufficient documentation

## 2012-11-13 DIAGNOSIS — M25569 Pain in unspecified knee: Secondary | ICD-10-CM

## 2012-11-13 DIAGNOSIS — M25469 Effusion, unspecified knee: Secondary | ICD-10-CM | POA: Insufficient documentation

## 2012-11-13 DIAGNOSIS — G43909 Migraine, unspecified, not intractable, without status migrainosus: Secondary | ICD-10-CM | POA: Insufficient documentation

## 2012-11-13 DIAGNOSIS — K5090 Crohn's disease, unspecified, without complications: Secondary | ICD-10-CM

## 2012-11-13 DIAGNOSIS — Z8619 Personal history of other infectious and parasitic diseases: Secondary | ICD-10-CM | POA: Insufficient documentation

## 2012-11-13 DIAGNOSIS — Z8541 Personal history of malignant neoplasm of cervix uteri: Secondary | ICD-10-CM | POA: Insufficient documentation

## 2012-11-13 DIAGNOSIS — Z8673 Personal history of transient ischemic attack (TIA), and cerebral infarction without residual deficits: Secondary | ICD-10-CM | POA: Insufficient documentation

## 2012-11-13 DIAGNOSIS — Z8639 Personal history of other endocrine, nutritional and metabolic disease: Secondary | ICD-10-CM | POA: Insufficient documentation

## 2012-11-13 DIAGNOSIS — F341 Dysthymic disorder: Secondary | ICD-10-CM | POA: Insufficient documentation

## 2012-11-13 DIAGNOSIS — Z8719 Personal history of other diseases of the digestive system: Secondary | ICD-10-CM | POA: Insufficient documentation

## 2012-11-13 DIAGNOSIS — M2390 Unspecified internal derangement of unspecified knee: Secondary | ICD-10-CM | POA: Insufficient documentation

## 2012-11-13 DIAGNOSIS — M2391 Unspecified internal derangement of right knee: Secondary | ICD-10-CM

## 2012-11-13 DIAGNOSIS — F17200 Nicotine dependence, unspecified, uncomplicated: Secondary | ICD-10-CM | POA: Insufficient documentation

## 2012-11-13 DIAGNOSIS — Z79899 Other long term (current) drug therapy: Secondary | ICD-10-CM | POA: Insufficient documentation

## 2012-11-13 DIAGNOSIS — Z9861 Coronary angioplasty status: Secondary | ICD-10-CM | POA: Insufficient documentation

## 2012-11-13 DIAGNOSIS — Z8679 Personal history of other diseases of the circulatory system: Secondary | ICD-10-CM | POA: Insufficient documentation

## 2012-11-13 DIAGNOSIS — Z862 Personal history of diseases of the blood and blood-forming organs and certain disorders involving the immune mechanism: Secondary | ICD-10-CM | POA: Insufficient documentation

## 2012-11-13 DIAGNOSIS — Z872 Personal history of diseases of the skin and subcutaneous tissue: Secondary | ICD-10-CM | POA: Insufficient documentation

## 2012-11-13 MED ORDER — ONDANSETRON 4 MG PO TBDP
4.0000 mg | ORAL_TABLET | Freq: Once | ORAL | Status: AC
  Administered 2012-11-13: 4 mg via ORAL
  Filled 2012-11-13: qty 1

## 2012-11-13 MED ORDER — HYDROCODONE-ACETAMINOPHEN 5-325 MG PO TABS: 1.0000 | ORAL_TABLET | Freq: Four times a day (QID) | ORAL | Status: DC | PRN

## 2012-11-13 MED ORDER — DICLOFENAC SODIUM 1 % TD GEL: 1.0000 "application " | Freq: Three times a day (TID) | TRANSDERMAL | Status: DC

## 2012-11-13 MED ORDER — HYDROCODONE-ACETAMINOPHEN 5-325 MG PO TABS
1.0000 | ORAL_TABLET | Freq: Once | ORAL | Status: AC
  Administered 2012-11-13: 1 via ORAL
  Filled 2012-11-13: qty 1

## 2012-11-13 MED ORDER — OXYCODONE-ACETAMINOPHEN 5-325 MG PO TABS: 1.0000 | ORAL_TABLET | Freq: Three times a day (TID) | ORAL | Status: DC | PRN

## 2012-11-13 NOTE — ED Notes (Signed)
Patient presents via [facility] with c/o right knee pain after falling down 2 steps on Saturday (2 days ago). Patient has had pain since but now complains of swelling as well. Patient able to ambulate but with pain.

## 2012-11-13 NOTE — Assessment & Plan Note (Signed)
Off immunosuppressant or immunomodulant per pt decision.

## 2012-11-13 NOTE — Patient Instructions (Signed)
This could be arthritis due to crohn's or meniscal injury. Elevate leg, ice knee, use voltaren gel and use percocets for breakthrough pain If worsening or any fevers,chills, nausea - return for aspiration. If not improving schedule appointment with [facility] for follow up

## 2012-11-13 NOTE — Assessment & Plan Note (Signed)
Anticipate intrinsic knee injury - meniscal tear vs arthritis - osteo vs crohn's related? Doubt septic joint as no erythema or marked warmth, and improved overnight. Pt knows to return here or seek urgent care for further eval if acutely worsening (would consider aspiration of effusion). Recommended treat with elevation, ice, rest, percocets and voltaren gel (avoid oral NSAIDs 2/2 Crohn's). If not improving with above, discussed referral to ortho.

## 2012-11-13 NOTE — Telephone Encounter (Signed)
[facility] Triage Call Report Triage Record Num: [id number] Operator: Agustina Caroli Patient Name: Sharon [name] Call Date & Time: 11/12/2012 10:30:46PM Patient Phone: [phone] PCP: Ria Bush Patient Gender: Female PCP Fax : [phone] Patient DOB: Mar 23, 1956 Practice Name: [facility] Reason for Call: Caller: Sharlisa/Patient; PCP: Ria Bush; CB#: [phone]; Call regarding right knee swollen; hurts to walk on it; Titilayo states her left knee is swollen twice the size of left knee onset 0930. Unable to walk on leg. Hurts when bending knee. Has been hopping on other foot. States she fell on 11/09/12 and hit foot but twisted her leg. States" knee looks out of place." Per knee injury protocol has 911 disposition due to obvious new deformity. Care advice given. Protocol(s) Used: Knee Injury Recommended Outcome per Protocol: Activate EMS 911 Reason for Outcome: Obvious new deformity (visibly out of place or misshapen) Care Advice: ~ Do not give the patient anything to eat or drink. ~ Apply cloth-covered ice pack or a cool compress to the area while in transit to reduce pain and swelling. ~ IMMEDIATE ACTION ~ DO NOT try to straighten or realign a misshaped bone or joint. Support injured part in position of comfort above and below the injury to reduce pain and prevent further damage; avoid unnecessary movement. ~ 11/12/2012 10:49:13PM Page 1 of 1 [email or web address]

## 2012-11-13 NOTE — Progress Notes (Signed)
Subjective:    Patient ID: Sharon [name], female    DOB: [birth year], 57 y.o.   MRN: [id number]  HPI CC: ER f/u  Sharon [name] was seen at the ER yesterday with R knee pain and swelling (CAN suggested she go to ER). 11/12/2012 - knee pain started - progressively worsening and more swelling. No prior h/o knee pain. Endorses some locking of knee as well as instability. Some body aches.  Records reviewed -  11/13/2012 *RADIOLOGY REPORT* Clinical Data: Knee pain and swelling after fall yesterday. RIGHT KNEE - COMPLETE 4+ VIEW Comparison: None. Findings: Mild medial compartment narrowing and hypertrophic changes consistent with early degenerative change. No significant effusion. No evidence of acute fracture or subluxation. No focal bone lesion or bone destruction. Bone cortex and trabecular architecture appear intact. IMPRESSION: Minimal degenerative change in the right knee. No displaced fractures identified. Original Report Authenticated By: Lucienne Capers, M.D.   Placed on immobilizer and sent home with hydrocodone script however states this causes nausea.  Has done well with percocets in past.  Slept with immobilizer last night, swelling and pain some down today.  Hesitant to f/u with ortho, does not want any more surgery.  To f/u with Dr. Tamera Punt next month for recent RTC repair surgery. Has stopped PT - trouble affording  Past Medical History  Diagnosis Date  . HTN (hypertension)   . Migraines     and frequent other headaches (sinus or stress)  . Crohn's disease     h/o stenotic crohn's, active in TI, PPD neg ([facility] Dr. [name]); has been on cimzia, remicade, mercaptopurine, entocort  . Cervical cancer 12/2008    s/p hysterectomy  . History of chicken pox   . Anxiety and depression   . History of anemia     attributed to crohn's  . Arthritis   . Genital warts   . History of syphilis 1980s  . Tobacco abuse   . TIA (transient ischemic attack) 05/2010    at [facility] - TIA vs complex  migraine (w/u negative - carotids, echo, TC doppler, and MRI normal)  . Hepatic steatosis 04/2011    diffuse on CT, 78m gallbladder polyp  . B12 deficiency     pernicious anemia - monthly b12 shots  . Scalp psoriasis   . GERD (gastroesophageal reflux disease)     severe, daily sxs if off PPI  . Takotsubo syndrome 07/2012    cath 08/01/12, normal coronaries, LVEF 65%    Past Surgical History  Procedure Laterality Date  . Appendectomy  1998  . Right colectomy  2000    crohn's disease, ileocecal resection  . Hospitalization  05/2010    TIA vs complex migraine - w/u negative - head CT, MRI/MRA, carotid dopplers, echo.  treated with ASA and tramadol  . [place]ography  05/2010    nl LV fxn ,EF 60%  . Abd [place] 04/2011    diffuse hepatic steatosis, 545mpolyp in gallbaldder fundus, no stones, mod abd ath  . Colonoscopy  10/2008    ileo-colonic anastomosis, ielocolonic crohn's  . Colonoscopy  05/2011    ileo-colonic anastomosis normal, normal mucosa  . Esophagogastroduodenoscopy  05/2011    nl esophagus, gastritis, nl duodenum  . Cervical biopsy  w/ loop electrode excision  10/2008  . Vaginal hysterectomy  12/2008    [facility]H/BSO  . Cardiac catheterization  07/2012    normal LV fxn, widely patent coronaries  . Colonoscopy  10/15/2012    focal inflammation at anastomosis, no  active crohn's, planning MR enterograph [name])  . Shoulder arthroscopy w/ rotator cuff repair Right 05/31/2012    [facility] Tamera Punt)   Current Outpatient Prescriptions on File Prior to Visit  Medication Sig Dispense Refill  . acetaminophen (TYLENOL) 500 MG tablet Take 1,000 mg by mouth every 6 (six) hours as needed. For pain      . amitriptyline (ELAVIL) 50 MG tablet TAKE 1 TABLET (50 MG TOTAL) BY MOUTH AT BEDTIME.  30 tablet  3  . ciprofloxacin-hydrocortisone (CIPRO HC) otic suspension Place 3 drops into the right ear 2 (two) times daily.  10 mL  0  . citalopram (CELEXA) 40 MG tablet Take 40 mg by mouth every  morning.      . cyanocobalamin (,VITAMIN B-12,) 1000 MCG/ML injection Inject 1,000 mcg into the muscle every 30 (thirty) days. Last dose 07/15/2012.      Marland Kitchen LORazepam (ATIVAN) 1 MG tablet Take 1 tablet (1 mg total) by mouth 3 (three) times daily as needed for anxiety.  90 tablet  0  . metoprolol succinate (TOPROL-XL) 50 MG 24 hr tablet Take 50 mg by mouth at bedtime.      . valACYclovir (VALTREX) 500 MG tablet TAKE 1 TABLET BY MOUTH EVERY DAY  30 tablet  6   No current facility-administered medications on file prior to visit.   Review of Systems Per HPI    Objective:   Physical Exam  Nursing note and vitals reviewed. Constitutional: She appears well-developed and well-nourished.  Musculoskeletal: She exhibits no edema.       Right knee: She exhibits decreased range of motion, swelling, effusion and abnormal meniscus. She exhibits no deformity and no erythema. Tenderness found. Medial joint line, lateral joint line and patellar tendon tenderness noted.       Left knee: Normal.  Marked swelling and effusion R knee, tender to palpation maximally at R medial knee.  Also tender lateral joint line as well as anterior knee. No popliteal fullness. + mcmurray's sign. Neg drawer test. Painful with flexion/extension      Assessment & Plan:

## 2012-11-13 NOTE — ED Provider Notes (Signed)
History     CSN: [id number]  Arrival date & time 11/13/12  0022   First MD Initiated Contact with Patient 11/13/12 0045      Chief Complaint  Patient presents with  . Knee Pain    (Consider location/radiation/quality/duration/timing/severity/associated sxs/prior treatment) HPI Comments: Patient states she stepped wrong 3 days ago twisting her R knee it has been painful since has been taking OTC tylenol without relief   Patient is a 57 y.o. female presenting with knee pain. The history is provided by the patient.  Knee Pain Location:  Knee Time since incident:  3 days Injury: yes   Knee location:  R knee Pain details:    Quality:  Aching   Radiates to:  Does not radiate   Severity:  Mild   Onset quality:  Sudden   Duration:  3 days   Timing:  Constant   Progression:  Unchanged Chronicity:  New Dislocation: no   Foreign body present:  No foreign bodies Relieved by:  Nothing Ineffective treatments:  Acetaminophen Associated symptoms: no fever     Past Medical History  Diagnosis Date  . HTN (hypertension)   . Migraines     and frequent other headaches (sinus or stress)  . Crohn's disease     h/o stenotic crohn's, active in TI, PPD neg ([facility] Dr. [name]); has been on cimzia, remicade, mercaptopurine, entocort  . Cervical cancer 12/2008    s/p hysterectomy  . History of chicken pox   . Anxiety and depression   . History of anemia     attributed to crohn's  . Arthritis   . Genital warts   . History of syphilis 1980s  . Tobacco abuse   . TIA (transient ischemic attack) 05/2010    at [facility] - TIA vs complex migraine (w/u negative - carotids, echo, TC doppler, and MRI normal)  . Hepatic steatosis 04/2011    diffuse on CT, 80m gallbladder polyp  . B12 deficiency     pernicious anemia - monthly b12 shots  . Scalp psoriasis   . GERD (gastroesophageal reflux disease)     severe, daily sxs if off PPI  . Takotsubo syndrome 07/2012    cath 08/01/12, normal coronaries,  LVEF 65%    Past Surgical History  Procedure Laterality Date  . Appendectomy  1998  . Right colectomy  2000    crohn's disease, ileocecal resection  . Hospitalization  05/2010    TIA vs complex migraine - w/u negative - head CT, MRI/MRA, carotid dopplers, echo.  treated with ASA and tramadol  . UKoreaechocardiography  05/2010    nl LV fxn ,EF 60%  . [place] 04/2011    diffuse hepatic steatosis, 522mpolyp in gallbaldder fundus, no stones, mod abd ath  . Colonoscopy  10/2008    ileo-colonic anastomosis, ielocolonic crohn's  . Colonoscopy  05/2011    ileo-colonic anastomosis normal, normal mucosa  . Esophagogastroduodenoscopy  05/2011    nl esophagus, gastritis, nl duodenum  . Cervical biopsy  w/ loop electrode excision  10/2008  . Vaginal hysterectomy  12/2008    LAVH/BSO  . Cardiac catheterization  07/2012    normal LV fxn, widely patent coronaries  . Colonoscopy  10/15/2012    focal inflammation at anastomosis, no active crohn's, planning MR enterograph (EOletta Lamas   Family History  Problem Relation Age of Onset  . Crohn's disease Daughter     crohn's, ulcerative colitis  . Ulcerative colitis Daughter   .  Stroke Mother   . Hypertension Mother   . Hyperlipidemia Mother   . Hypertension Father   . Crohn's disease Father   . Crohn's disease Sister   . Cancer Sister     ovarian  . Crohn's disease Paternal Aunt   . Crohn's disease Paternal Uncle   . Diabetes Neg Hx     History  Substance Use Topics  . Smoking status: Current Every Day Smoker -- 0.33 packs/day for 10 years    Types: Cigarettes  . Smokeless tobacco: Never Used  . Alcohol Use: No     Comment: remote EtOh use    OB History   Grav Para Term Preterm Abortions TAB SAB Ect Mult Living   4 3   1  1   3       Review of Systems  Constitutional: Negative for fever.  Musculoskeletal: Positive for joint swelling, arthralgias and gait problem.  All other systems reviewed and are negative.    Allergies   Codeine; Ibuprofen; and Remeron  Home Medications   Current Outpatient Rx  Name  Route  Sig  Dispense  Refill  . acetaminophen (TYLENOL) 500 MG tablet   Oral   Take 1,000 mg by mouth every 6 (six) hours as needed. For pain         . amitriptyline (ELAVIL) 50 MG tablet      TAKE 1 TABLET (50 MG TOTAL) BY MOUTH AT BEDTIME.   30 tablet   3   . ciprofloxacin-hydrocortisone (CIPRO HC) otic suspension   Right Ear   Place 3 drops into the right ear 2 (two) times daily.   10 mL   0   . citalopram (CELEXA) 40 MG tablet   Oral   Take 40 mg by mouth every morning.         . cyanocobalamin (,VITAMIN B-12,) 1000 MCG/ML injection   Intramuscular   Inject 1,000 mcg into the muscle every 30 (thirty) days. Last dose 07/15/2012.         Marland Kitchen LORazepam (ATIVAN) 1 MG tablet   Oral   Take 1 tablet (1 mg total) by mouth 3 (three) times daily as needed for anxiety.   90 tablet   0   . metoprolol succinate (TOPROL-XL) 50 MG 24 hr tablet   Oral   Take 50 mg by mouth at bedtime.         . valACYclovir (VALTREX) 500 MG tablet      TAKE 1 TABLET BY MOUTH EVERY DAY   30 tablet   6     BP 134/76  Pulse 72  Temp(Src) 97.8 F (36.6 C) (Oral)  Resp 16  SpO2 96%  Physical Exam  Nursing note and vitals reviewed. Constitutional: She appears well-nourished.  HENT:  Head: Normocephalic.  Eyes: Pupils are equal, round, and reactive to light.  Neck: Normal range of motion.  Cardiovascular: Normal rate.   Pulmonary/Chest: Effort normal.  Musculoskeletal: She exhibits tenderness. She exhibits no edema.       Right knee: She exhibits decreased range of motion and swelling. She exhibits no deformity, no laceration, no erythema and no LCL laxity. Tenderness found.  Neurological: She is alert.  Skin: Skin is warm. She is diaphoretic. No erythema.    ED Course  Procedures (including critical care time)  Labs Reviewed - No data to display Dg Knee Complete 4 Views Right  11/13/2012   *RADIOLOGY REPORT*  Clinical Data: Knee pain and swelling after fall yesterday.  RIGHT KNEE -  COMPLETE 4+ VIEW  Comparison: None.  Findings: Mild medial compartment narrowing and hypertrophic changes consistent with early degenerative change.  No significant effusion.  No evidence of acute fracture or subluxation.  No focal bone lesion or bone destruction.  Bone cortex and trabecular architecture appear intact.  IMPRESSION: Minimal degenerative change in the right knee.  No displaced fractures identified.   Original Report Authenticated By: Lucienne Capers, M.D.      No diagnosis found.    MDM  Xray reviewed Will place immobilizer, pain control and FU with her Ortho        Garald Balding, NP 11/14/12 [phone]

## 2012-11-13 NOTE — Telephone Encounter (Signed)
Seen at ER, xray: Minimal degenerative change in the right knee. No displaced fractures identified. rec f/u with ortho.

## 2012-11-14 ENCOUNTER — Telehealth: Payer: Self-pay | Admitting: Family Medicine

## 2012-11-14 ENCOUNTER — Encounter: Payer: Self-pay | Admitting: *Deleted

## 2012-11-14 NOTE — Telephone Encounter (Signed)
Pt requesting work note stating that she cannot stand for long periods of time and must elevate her leg when she is not standing.  She would like this note faxed to her at [phone].

## 2012-11-14 NOTE — Telephone Encounter (Signed)
Ok to do this.  Thansk.  Avoid prolonged periods on feet for next 2 weeks, and encourage elevation of right leg when sitting down.

## 2012-11-14 NOTE — Telephone Encounter (Signed)
Note written and faxed as requested.

## 2012-11-16 ENCOUNTER — Ambulatory Visit
Admission: RE | Admit: 2012-11-16 | Discharge: 2012-11-16 | Disposition: A | Discharge status: Home / Self Care | Payer: BC Managed Care – PPO | Source: Ambulatory Visit | Attending: Family Medicine | Admitting: Family Medicine

## 2012-11-16 DIAGNOSIS — Z1231 Encounter for screening mammogram for malignant neoplasm of breast: Secondary | ICD-10-CM

## 2012-11-17 NOTE — ED Provider Notes (Signed)
Medical screening examination/treatment/procedure(s) were performed by non-physician practitioner and as supervising physician I was immediately available for consultation/collaboration.  Carlisle Beers, MD 11/17/12 [phone]

## 2012-11-19 ENCOUNTER — Encounter: Payer: Self-pay | Admitting: *Deleted

## 2012-11-19 ENCOUNTER — Telehealth: Payer: Self-pay | Admitting: *Deleted

## 2012-11-19 NOTE — Telephone Encounter (Signed)
I would suggest ortho referral.

## 2012-11-19 NOTE — Telephone Encounter (Signed)
Patient called and said her knee/leg is still painful. She said her knee isn't as swollen as it was, but now her leg is swollen too. She was wanting to know if you thought she should come back here for a recheck or go on to ortho?

## 2012-11-19 NOTE — Telephone Encounter (Signed)
Patient notified

## 2012-11-22 DIAGNOSIS — K589 Irritable bowel syndrome without diarrhea: Secondary | ICD-10-CM | POA: Insufficient documentation

## 2012-12-01 ENCOUNTER — Other Ambulatory Visit: Payer: Self-pay | Admitting: Family Medicine

## 2012-12-02 NOTE — Telephone Encounter (Signed)
plz phone in. 

## 2012-12-03 NOTE — Telephone Encounter (Signed)
Rx called in as directed.   

## 2012-12-04 ENCOUNTER — Telehealth: Payer: Self-pay | Admitting: *Deleted

## 2012-12-04 ENCOUNTER — Telehealth: Payer: Self-pay

## 2012-12-04 DIAGNOSIS — E538 Deficiency of other specified B group vitamins: Secondary | ICD-10-CM

## 2012-12-04 NOTE — Telephone Encounter (Signed)
Pt last B 12 injection was 11/13/12; pt left message about next B 12 injection appt. Left v/m for pt to call back.

## 2012-12-04 NOTE — Telephone Encounter (Signed)
Patient called and said she had accidentally taken her amitriptyline and lorazepam this AM instead of her citalopram and lorazepam. She was inquiring if there could be any interaction if she went ahead and took her citalopram. Dr. Danise Mina advised she should be fine, but to only take 1/2 of the amitriptyline tonight instead of the full dose. Patient verbalized understanding.

## 2012-12-10 MED ORDER — CYANOCOBALAMIN 1000 MCG PO TABS: 1000.0000 ug | ORAL_TABLET | Freq: Every day | ORAL | Status: DC

## 2012-12-10 NOTE — Telephone Encounter (Signed)
Given h/o crohn's, pt may need injections to keep levels up.  Given shortage, may try oral supplementation. I'd like her to come in this week to check B12 levels for a baseline.  Placed order in chart. Please have her take 1041mg daily for next 3 months and return for recheck afterwards.

## 2012-12-10 NOTE — Telephone Encounter (Signed)
Patient notified. Lab appt scheduled.

## 2012-12-10 NOTE — Telephone Encounter (Signed)
Pt is would be willing to take oral Vit B 12 OTC; pt request dosage and directions for taking oral Vit B 12.Please advise.

## 2012-12-12 ENCOUNTER — Other Ambulatory Visit (INDEPENDENT_AMBULATORY_CARE_PROVIDER_SITE_OTHER): Payer: BC Managed Care – PPO

## 2012-12-12 DIAGNOSIS — E538 Deficiency of other specified B group vitamins: Secondary | ICD-10-CM

## 2012-12-14 ENCOUNTER — Other Ambulatory Visit: Payer: Self-pay | Admitting: Family Medicine

## 2012-12-14 DIAGNOSIS — E538 Deficiency of other specified B group vitamins: Secondary | ICD-10-CM

## 2012-12-14 DIAGNOSIS — K5090 Crohn's disease, unspecified, without complications: Secondary | ICD-10-CM

## 2012-12-17 ENCOUNTER — Encounter: Payer: Self-pay | Admitting: *Deleted

## 2012-12-18 ENCOUNTER — Encounter: Payer: Self-pay | Admitting: Family Medicine

## 2012-12-18 ENCOUNTER — Ambulatory Visit (INDEPENDENT_AMBULATORY_CARE_PROVIDER_SITE_OTHER): Payer: BC Managed Care – PPO | Admitting: Family Medicine

## 2012-12-18 ENCOUNTER — Encounter: Payer: Self-pay | Admitting: *Deleted

## 2012-12-18 VITALS — BP 132/84 | HR 74 | Temp 98.2°F | Ht 62.0 in | Wt 166.5 lb

## 2012-12-18 DIAGNOSIS — R197 Diarrhea, unspecified: Secondary | ICD-10-CM | POA: Insufficient documentation

## 2012-12-18 MED ORDER — PROMETHAZINE HCL 25 MG PO TABS: 25.0000 mg | ORAL_TABLET | Freq: Three times a day (TID) | ORAL | Status: DC | PRN

## 2012-12-18 NOTE — Patient Instructions (Signed)
May use phenergan for nausea as needed - sent in. Work note provided today. Blood work today, stool culture kit today. If unrevealing, I recommend return to Dr. Koleen Distance for evaluation.

## 2012-12-18 NOTE — Progress Notes (Signed)
  Subjective:    Patient ID: Sharon [name], female    DOB: 1956-07-03, 57 y.o.   MRN: [id number]  HPI CC: not feeling well  Several week history of loose diarrhea, worsened over last 5 days - more watery.  Going 8-10 times a day.  Anything she eats/drinks makes her nauseated.   Increased fatigue and sleeping. Some abd pain described as ache, worse when has to evacuate.  Not improved with BM. Some intermittent emesis. Doesn't feel like her typical crohn's flare. Worried is getting dehydrated.  Only drinking water and tea.  Started seeing Dr. Koleen Distance who started her back on 6MP.  Has been out of work for last 2 days.  No sick contacts at home. No new foods, restaurants, no recent travel. No blood in stool.  + lighter stool recently. Does have well water, uses to cook/boils.  Otherwise uses bottled water.  Lab Results  Component Value Date   TSH 3.899 08/01/2012    Past Medical History  Diagnosis Date  . HTN (hypertension)   . Migraines     and frequent other headaches (sinus or stress)  . Crohn's disease     h/o stenotic crohn's, active in TI, PPD neg ([facility] Dr. [name]); has been on cimzia, remicade, mercaptopurine, entocort  . Cervical cancer 12/2008    s/p hysterectomy  . History of chicken pox   . Anxiety and depression   . History of anemia     attributed to crohn's  . Arthritis   . Genital warts   . History of syphilis 1980s  . Tobacco abuse   . TIA (transient ischemic attack) 05/2010    at [facility] - TIA vs complex migraine (w/u negative - carotids, echo, TC doppler, and MRI normal)  . Hepatic steatosis 04/2011    diffuse on CT, 31m gallbladder polyp  . B12 deficiency     pernicious anemia - monthly b12 shots  . Scalp psoriasis   . GERD (gastroesophageal reflux disease)     severe, daily sxs if off PPI  . Takotsubo syndrome 07/2012    cath 08/01/12, normal coronaries, LVEF 65%   Review of Systems Per HPI    Objective:   Physical Exam  Nursing note and  vitals reviewed. Constitutional: She appears well-developed and well-nourished. No distress.  HENT:  Head: Normocephalic and atraumatic.  Mouth/Throat: No oropharyngeal exudate.  Some dry MM  Eyes: Conjunctivae and EOM are normal. Pupils are equal, round, and reactive to light. No scleral icterus.  Neck: Normal range of motion. Neck supple.  Cardiovascular: Normal rate, regular rhythm, normal heart sounds and intact distal pulses.   No murmur heard. Pulmonary/Chest: Effort normal and breath sounds normal. No respiratory distress. She has no wheezes. She has no rales.  Abdominal: Soft. Normal appearance and bowel sounds are normal. She exhibits no distension and no mass. There is no hepatosplenomegaly. There is tenderness (mild) in the right upper quadrant, periumbilical area and left lower quadrant. There is no rebound, no guarding and no CVA tenderness. No hernia.  Musculoskeletal: She exhibits no edema.  Skin: Skin is warm and dry. No rash noted.       Assessment & Plan:

## 2012-12-18 NOTE — Assessment & Plan Note (Signed)
With nausea and abdominal soreness. In h/o crohn's but latest GI eval at [facility] though possible IBS/functional abd pain component as well. Given longstanding nature of diarrhea, will obtain blood work (CBC, CMP) as well as stool culture (on immunosuppressant) and O&P (well water). Treat nausea with phenergan. If w/u unrevealing, recommended return to GI to eval possible crohn's flare. Pt doesn't feel current pain stemming from Crohn's - less severe than crohn's.

## 2012-12-19 LAB — CBC WITH DIFFERENTIAL/PLATELET
Basophils Relative: 0.5 % (ref 0.0–3.0)
Eosinophils Relative: 2 % (ref 0.0–5.0)
HCT: 38.5 % (ref 36.0–46.0)
Lymphs Abs: 1.9 10*3/uL (ref 0.7–4.0)
MCV: 90.2 fl (ref 78.0–100.0)
Monocytes Absolute: 0.3 10*3/uL (ref 0.1–1.0)
Monocytes Relative: 3.8 % (ref 3.0–12.0)
Neutrophils Relative %: 69.9 % (ref 43.0–77.0)
Platelets: 259 10*3/uL (ref 150.0–400.0)
RBC: 4.26 Mil/uL (ref 3.87–5.11)
WBC: 7.8 10*3/uL (ref 4.5–10.5)

## 2012-12-19 LAB — COMPREHENSIVE METABOLIC PANEL
Alkaline Phosphatase: 109 U/L (ref 39–117)
BUN: 4 mg/dL — ABNORMAL LOW (ref 6–23)
CO2: 26 mEq/L (ref 19–32)
GFR: 78 mL/min (ref >60.00)
Glucose, Bld: 77 mg/dL (ref 70–99)
Total Bilirubin: 0.6 mg/dL (ref 0.3–1.2)

## 2012-12-20 ENCOUNTER — Ambulatory Visit: Payer: BC Managed Care – PPO | Admitting: Family Medicine

## 2012-12-23 LAB — STOOL CULTURE

## 2012-12-24 ENCOUNTER — Telehealth: Payer: Self-pay | Admitting: *Deleted

## 2012-12-24 DIAGNOSIS — R197 Diarrhea, unspecified: Secondary | ICD-10-CM

## 2012-12-24 NOTE — Telephone Encounter (Signed)
Spoke with patient. She is still having diarrhea despite negative cultures. Dr. Darnell Level advised to follow up with GI doctor at [facility]. She asks for referral because she thinks she can get in quicker if we do it than if she tries to do it herself. She sees Dr. Koleen Distance in the [place]. Can you put in referral for Dr. Darnell Level? Thanks!

## 2012-12-24 NOTE — Telephone Encounter (Signed)
Referral placed.

## 2012-12-27 DIAGNOSIS — H903 Sensorineural hearing loss, bilateral: Secondary | ICD-10-CM

## 2012-12-27 HISTORY — DX: Sensorineural hearing loss, bilateral: H90.3

## 2013-01-06 ENCOUNTER — Other Ambulatory Visit: Payer: Self-pay | Admitting: Family Medicine

## 2013-01-06 NOTE — Telephone Encounter (Signed)
plz phone in. 

## 2013-01-07 NOTE — Telephone Encounter (Signed)
Rx called in as directed.   

## 2013-01-09 ENCOUNTER — Encounter: Payer: Self-pay | Admitting: Family Medicine

## 2013-02-06 ENCOUNTER — Other Ambulatory Visit: Payer: Self-pay | Admitting: Family Medicine

## 2013-02-06 NOTE — Telephone Encounter (Signed)
plz phone in. 

## 2013-02-07 NOTE — Telephone Encounter (Signed)
[facility]ed [facility]

## 2013-02-11 ENCOUNTER — Ambulatory Visit (INDEPENDENT_AMBULATORY_CARE_PROVIDER_SITE_OTHER): Payer: BC Managed Care – PPO | Admitting: Family Medicine

## 2013-02-11 ENCOUNTER — Encounter: Payer: Self-pay | Admitting: Family Medicine

## 2013-02-11 VITALS — BP 120/70 | HR 69 | Temp 97.4°F | Ht 62.0 in | Wt 168.0 lb

## 2013-02-11 DIAGNOSIS — L03213 Periorbital cellulitis: Secondary | ICD-10-CM

## 2013-02-11 DIAGNOSIS — H00039 Abscess of eyelid unspecified eye, unspecified eyelid: Secondary | ICD-10-CM

## 2013-02-11 MED ORDER — PROMETHAZINE HCL 25 MG PO TABS: 25.0000 mg | ORAL_TABLET | Freq: Four times a day (QID) | ORAL | Status: DC | PRN

## 2013-02-11 MED ORDER — AMOXICILLIN-POT CLAVULANATE 875-125 MG PO TABS: 1.0000 | ORAL_TABLET | Freq: Two times a day (BID) | ORAL | Status: DC

## 2013-02-11 NOTE — Progress Notes (Signed)
Therapist, music at [facility] [place] [address] Phone: [phone] Fax: [phone]  Date:  02/11/2013   Name:  Sharon [name]   DOB:  1956-07-30   MRN:  [id number] Gender: female Age: 57 y.o.  Primary Physician:  Ria Bush, MD  Evaluating MD: Owens Loffler, MD   Chief Complaint: Facial Swelling   History of Present Illness:  Sharon [name] is a 57 y.o. pleasant patient who presents with the following:  R eye, felt like it ws a little bit swollen. Woke up this morning and whole eye was open. Nothing new. No exposures. Does hurt. Taken Benadryl, which does not seem to help much. Around face.  It is swollen some, mildly pink and a little warm to touch.  Decreased vision - recently. Today? Last eye exam 5 years ago.  20/30 bilateral when checked today in the office. Without deficit.   No other facial lesion, uri symptoms, sinus symptoms, or other acute complaints.   Patient Active Problem List   Diagnosis Date Noted  . Diarrhea 12/18/2012  . Right knee pain 11/13/2012  . External otitis of right ear 11/01/2012  . Takotsubo syndrome   . Chest pain 08/02/2012  . Bilateral hearing loss 06/27/2012  . GERD (gastroesophageal reflux disease)   . Skin rash 04/27/2012  . Right shoulder pain 03/13/2012  . Urgency of urination 03/13/2012  . History of pneumonia 01/18/2012  . Abdominal  pain, other specified site 01/13/2012  . Headache 10/13/2011  . Healthcare maintenance 08/16/2011  . Leukopenia 08/16/2011  . Vitamin B12 deficiency   . HTN (hypertension)   . Migraines   . Crohn's disease   . Anxiety and depression   . Smoker   . TIA (transient ischemic attack)     Past Medical History  Diagnosis Date  . HTN (hypertension)   . Migraines     and frequent other headaches (sinus or stress)  . Crohn's disease     h/o stenotic crohn's, active in TI, PPD neg ([facility] Dr. [name]); has been on cimzia, remicade, mercaptopurine, entocort    . Cervical cancer 12/2008    s/p hysterectomy  . History of chicken pox   . Anxiety and depression   . History of anemia     attributed to crohn's  . Arthritis   . Genital warts   . History of syphilis 1980s  . Tobacco abuse   . TIA (transient ischemic attack) 05/2010    at [facility] - TIA vs complex migraine (w/u negative - carotids, echo, TC doppler, and MRI normal)  . Hepatic steatosis 04/2011    diffuse on CT, 58m gallbladder polyp  . B12 deficiency     pernicious anemia - monthly b12 shots  . Scalp psoriasis   . GERD (gastroesophageal reflux disease)     severe, daily sxs if off PPI  . Takotsubo syndrome 07/2012    cath 08/01/12, normal coronaries, LVEF 65%  . Sensorineural hearing loss of both ears 12/2012    high freq    Past Surgical History  Procedure Laterality Date  . Appendectomy  1998  . Right colectomy  2000    crohn's disease, ileocecal resection  . Hospitalization  05/2010    TIA vs complex migraine - w/u negative - head CT, MRI/MRA, carotid dopplers, echo.  treated with ASA and tramadol  . [place]oreaechocardiography  05/2010    nl LV fxn ,EF 60%  . Abd [place] 04/2011  diffuse hepatic steatosis, 54m polyp in gallbaldder fundus, no stones, mod abd ath  . Colonoscopy  10/2008    ileo-colonic anastomosis, ielocolonic crohn's  . Colonoscopy  05/2011    ileo-colonic anastomosis normal, normal mucosa  . Esophagogastroduodenoscopy  05/2011    nl esophagus, gastritis, nl duodenum  . Cervical biopsy  w/ loop electrode excision  10/2008  . Vaginal hysterectomy  12/2008    LAVH/BSO  . Cardiac catheterization  07/2012    normal LV fxn, widely patent coronaries  . Colonoscopy  10/15/2012    focal inflammation at anastomosis, no active crohn's, planning MR enterograph ([name]  . Shoulder arthroscopy w/ rotator cuff repair Right 05/31/2012    [facility] (Tamera Punt    History   Social History  . Marital Status: Divorced    Spouse Name: N/A    Number of Children: N/A  .  Years of Education: N/A   Occupational History  . Not on file.   Social History Main Topics  . Smoking status: Current Every Day Smoker -- 0.33 packs/day for 10 years    Types: Cigarettes  . Smokeless tobacco: Never Used  . Alcohol Use: No     Comment: remote EtOh use  . Drug Use: No     Comment: extensive drug use, remotely  . Sexually Active: Yes     Comment: HYST   Other Topics Concern  . 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 Crohn's disease Daughter     crohn's, ulcerative colitis  . Ulcerative colitis Daughter   . Stroke Mother   . Hypertension Mother   . Hyperlipidemia Mother   . Hypertension Father   . Crohn's disease Father   . Crohn's disease Sister   . Cancer Sister     ovarian  . Crohn's disease Paternal Aunt   . Crohn's disease Paternal Uncle   . Diabetes Neg Hx     Allergies  Allergen Reactions  . Codeine Nausea And Vomiting  . Hydrocodone Nausea Only  . Ibuprofen Other (See Comments)    Pt has crohn's disease  . Remeron (Mirtazapine) Other (See Comments)    Knocked out    Medication list has been reviewed and updated.  Outpatient Prescriptions Prior to Visit  Medication Sig Dispense Refill  . acetaminophen (TYLENOL) 500 MG tablet Take 1,000 mg by mouth every 6 (six) hours as needed. For pain      . amitriptyline (ELAVIL) 50 MG tablet TAKE 1 TABLET (50 MG TOTAL) BY MOUTH AT BEDTIME.  30 tablet  3  . citalopram (CELEXA) 40 MG tablet Take 40 mg by mouth every morning.      . cyanocobalamin ([facility] VITAMIN B12) 1000 MCG tablet Take 1 tablet (1,000 mcg total) by mouth daily.      .Marland KitchenLORazepam (ATIVAN) 1 MG tablet TAKE 1 TABLET BY MOUTH 3 TIMES A DAY AS NEEDED FOR ANXIETY  90 tablet  0  . mercaptopurine (PURINETHOL) 50 MG  tablet Take 75 mg by mouth daily. Give on an empty stomach 1 hour before or 2 hours after meals. Caution: Chemotherapy.      . metoprolol succinate (TOPROL-XL) 50 MG 24 hr tablet Take 50 mg by mouth at bedtime.      .Marland Kitchen  valACYclovir (VALTREX) 500 MG tablet TAKE 1 TABLET BY MOUTH EVERY DAY  30 tablet  6  . cyanocobalamin (,VITAMIN B-12,) 1000 MCG/ML injection Inject 1,000 mcg into the muscle every 30 (thirty) days. Last dose 07/15/2012.      . diclofenac sodium (VOLTAREN) 1 % GEL Apply 1 application topically 3 (three) times daily.  1 Tube  0  . oxyCODONE-acetaminophen (ROXICET) 5-325 MG per tablet Take 1 tablet by mouth every 8 (eight) hours as needed for pain.  30 tablet  0  . promethazine (PHENERGAN) 25 MG tablet Take 1 tablet (25 mg total) by mouth every 6 (six) hours as needed for nausea.  30 tablet  0  . promethazine (PHENERGAN) 25 MG tablet Take 1 tablet (25 mg total) by mouth every 8 (eight) hours as needed for nausea.  30 tablet  0   No facility-administered medications prior to visit.    Review of Systems:  ROS: GEN: Acute illness details above GI: Tolerating PO intake GU: maintaining adequate hydration and urination Pulm: No SOB Interactive and getting along well at home.  Otherwise, ROS is as per the HPI.   Physical Examination: BP 120/70  Pulse 69  Temp(Src) 97.4 F (36.3 C) (Oral)  Ht 5' 2"  (1.575 m)  Wt 168 lb (76.204 kg)  BMI 30.72 kg/m2  SpO2 96%  Ideal Body Weight: Weight in (lb) to have BMI = 25: 136.4   GEN: WDWN, NAD, Non-toxic, A & O x 3 HEENT: Atraumatic, Normocephalic. Neck supple. No masses, No LAD. PERRLA. EOMI. Mild swelling around R eye and R upper cheek. Mildly pink and mildly warm to touch and slightly tender. Eye movements NT. Ears and Nose: No external deformity. CV: RRR, No M/G/R. No JVD. No thrill. No extra heart sounds. PULM: CTA B, no wheezes, crackles, rhonchi. No retractions. No resp. distress. No accessory muscle use. EXTR: No c/c/e NEURO  Normal gait.  PSYCH: Normally interactive. Conversant. Not depressed or anxious appearing.  Calm demeanor.    Assessment and Plan:  Periorbital cellulitis of right eye  Most c/w periorbital cellulitis. Strict precautions given about concerns for acute worsening, fever, and if eye swells shut. Medical attention ASAP.  Orders Today:  No orders of the defined types were placed in this encounter.    Updated Medication List: (Includes new medications, updates to list, dose adjustments) Meds ordered this encounter  Medications  . amoxicillin-clavulanate (AUGMENTIN) 875-125 MG per tablet    Sig: Take 1 tablet by mouth 2 (two) times daily.    Dispense:  20 tablet    Refill:  0  . promethazine (PHENERGAN) 25 MG tablet    Sig: Take 1 tablet (25 mg total) by mouth every 6 (six) hours as needed for nausea.    Dispense:  30 tablet    Refill:  2    Medications Discontinued: Medications Discontinued During This Encounter  Medication Reason  . promethazine (PHENERGAN) 25 MG tablet Error  . promethazine (PHENERGAN) 25 MG tablet Error  . oxyCODONE-acetaminophen (ROXICET) 5-325 MG per tablet Error  . diclofenac sodium (VOLTAREN) 1 % GEL Error      Signed, Alexsus Papadopoulos T. Brittiny Levitz, MD 02/11/2013 10:48 AM

## 2013-02-12 ENCOUNTER — Encounter: Payer: Self-pay | Admitting: *Deleted

## 2013-02-12 ENCOUNTER — Other Ambulatory Visit: Payer: Self-pay | Admitting: Family Medicine

## 2013-02-12 DIAGNOSIS — K50918 Crohn's disease, unspecified, with other complication: Secondary | ICD-10-CM

## 2013-02-12 DIAGNOSIS — E785 Hyperlipidemia, unspecified: Secondary | ICD-10-CM

## 2013-02-12 DIAGNOSIS — I10 Essential (primary) hypertension: Secondary | ICD-10-CM

## 2013-02-13 ENCOUNTER — Other Ambulatory Visit (INDEPENDENT_AMBULATORY_CARE_PROVIDER_SITE_OTHER): Payer: BC Managed Care – PPO

## 2013-02-13 DIAGNOSIS — E785 Hyperlipidemia, unspecified: Secondary | ICD-10-CM

## 2013-02-13 DIAGNOSIS — I10 Essential (primary) hypertension: Secondary | ICD-10-CM

## 2013-02-13 DIAGNOSIS — K50918 Crohn's disease, unspecified, with other complication: Secondary | ICD-10-CM

## 2013-02-13 DIAGNOSIS — K5090 Crohn's disease, unspecified, without complications: Secondary | ICD-10-CM

## 2013-02-13 LAB — COMPREHENSIVE METABOLIC PANEL
Albumin: 3.6 g/dL (ref 3.5–5.2)
Alkaline Phosphatase: 115 U/L (ref 39–117)
BUN: 5 mg/dL — ABNORMAL LOW (ref 6–23)
Creatinine, Ser: 0.9 mg/dL (ref 0.4–1.2)
Glucose, Bld: 123 mg/dL — ABNORMAL HIGH (ref 70–99)
Total Bilirubin: 0.2 mg/dL — ABNORMAL LOW (ref 0.3–1.2)

## 2013-02-13 LAB — CBC WITH DIFFERENTIAL/PLATELET
Basophils Relative: 0.6 % (ref 0.0–3.0)
Eosinophils Relative: 2.2 % (ref 0.0–5.0)
HCT: 38.8 % (ref 36.0–46.0)
MCV: 91.1 fl (ref 78.0–100.0)
Monocytes Absolute: 0.6 10*3/uL (ref 0.1–1.0)
Neutrophils Relative %: 66.1 % (ref 43.0–77.0)
RBC: 4.26 Mil/uL (ref 3.87–5.11)
WBC: 8.1 10*3/uL (ref 4.5–10.5)

## 2013-02-13 LAB — LIPID PANEL
Cholesterol: 166 mg/dL (ref 0–200)
HDL: 45.3 mg/dL (ref >39.00)
Triglycerides: 192 mg/dL — ABNORMAL HIGH (ref 0.0–149.0)
VLDL: 38.4 mg/dL (ref 0.0–40.0)

## 2013-02-15 ENCOUNTER — Other Ambulatory Visit (HOSPITAL_COMMUNITY)
Admission: RE | Admit: 2013-02-15 | Discharge: 2013-02-15 | Disposition: A | Discharge status: Home / Self Care | Payer: BC Managed Care – PPO | Source: Ambulatory Visit | Attending: Family Medicine | Admitting: Family Medicine

## 2013-02-15 ENCOUNTER — Encounter: Payer: Self-pay | Admitting: Family Medicine

## 2013-02-15 ENCOUNTER — Ambulatory Visit (INDEPENDENT_AMBULATORY_CARE_PROVIDER_SITE_OTHER): Payer: BC Managed Care – PPO | Admitting: Family Medicine

## 2013-02-15 VITALS — BP 118/82 | HR 84 | Temp 98.4°F | Ht 61.0 in | Wt 170.2 lb

## 2013-02-15 DIAGNOSIS — Z01419 Encounter for gynecological examination (general) (routine) without abnormal findings: Secondary | ICD-10-CM | POA: Insufficient documentation

## 2013-02-15 DIAGNOSIS — Z0000 Encounter for general adult medical examination without abnormal findings: Secondary | ICD-10-CM

## 2013-02-15 DIAGNOSIS — R7309 Other abnormal glucose: Secondary | ICD-10-CM

## 2013-02-15 DIAGNOSIS — Z1151 Encounter for screening for human papillomavirus (HPV): Secondary | ICD-10-CM | POA: Insufficient documentation

## 2013-02-15 DIAGNOSIS — R739 Hyperglycemia, unspecified: Secondary | ICD-10-CM

## 2013-02-15 NOTE — Progress Notes (Signed)
Subjective:    Patient ID: Sharon [name], female    DOB: [birth year], 57 y.o.   MRN: [id number]  HPI CC: CPE  Quit job this year.  Goes to [employer] - restarted school.  Seen 02/11/2013 with R eye periorbital cellulitis, treated with augmentin.  Quickly improved.  Crohn's - Dr. Koleen Distance at [facility].  Recent check for C diff.  Wt Readings from Last 3 Encounters:  02/15/13 170 lb 4 oz (77.225 kg)  02/11/13 168 lb (76.204 kg)  12/18/12 166 lb 8 oz (75.524 kg)    Preventative:  Last CPE - unsure.  Does get well woman exams with OBGYN Dr. Toney Rakes.  H/o CIN3 s/p hysterectomy. rec yearly pap's, requests here today. Mammogram WNL 10/2012 ([facility]) Flu at work.  Tdap 10/2011  Pneumovax 10/2011 Thinks has had dexa scan 2013 with Dr. Toney Rakes, thinks normal.   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  Past histories reviewed and updated if relevant as below. Patient Active Problem List   Diagnosis Date Noted  . HLD (hyperlipidemia) 02/12/2013  . Diarrhea 12/18/2012  . Right knee pain 11/13/2012  . External otitis of right ear 11/01/2012  . Takotsubo syndrome   . Chest pain 08/02/2012  . Bilateral hearing loss 06/27/2012  . GERD (gastroesophageal reflux disease)   . Skin rash 04/27/2012  . Right shoulder pain 03/13/2012  . Urgency of urination 03/13/2012  . History of pneumonia 01/18/2012  . Abdominal  pain, other specified site 01/13/2012  . Headache 10/13/2011  . Healthcare maintenance 08/16/2011  . Leukopenia 08/16/2011  . Vitamin B12 deficiency   . HTN (hypertension)   . Migraines   . Crohn's disease   . Anxiety and depression   . Smoker   . TIA (transient ischemic attack)    Past Medical History   Diagnosis Date  . HTN (hypertension)   . Migraines     and frequent other headaches (sinus or stress)  . Crohn's disease     h/o stenotic crohn's, active in TI, PPD neg ([facility] Dr. [name]); has been on cimzia, remicade, mercaptopurine, entocort  . Cervical cancer 12/2008    s/p hysterectomy  . History of chicken pox   . Anxiety and depression   . History of anemia     attributed to crohn's  . Arthritis   . Genital warts   . History of syphilis 1980s  . Tobacco abuse   . TIA (transient ischemic attack) 05/2010    at [facility] - TIA vs complex migraine (w/u negative - carotids, echo, TC doppler, and MRI normal)  . Hepatic steatosis 04/2011    diffuse on CT, 81m gallbladder polyp  . B12 deficiency     pernicious anemia - monthly b12 shots  . Scalp psoriasis   . GERD (gastroesophageal reflux disease)     severe, daily sxs if off PPI  . Takotsubo syndrome 07/2012    cath 08/01/12, normal coronaries, LVEF 65%  . Sensorineural hearing loss of both ears 12/2012    high freq   Past Surgical History  Procedure Laterality Date  . Appendectomy  1998  . Right colectomy  2000    crohn's disease, ileocecal resection  . Hospitalization  05/2010    TIA vs complex migraine - w/u negative - head CT,  MRI/MRA, carotid dopplers, echo.  treated with ASA and tramadol  . US echocardiography  05/2010    nl LV fxn ,EF 60%  . Abd [place]  04/2011    diffuse hepatic steatosis, 8m polyp in gallbaldder fundus, no stones, mod abd ath  . Colonoscopy  10/2008    ileo-colonic anastomosis, ielocolonic crohn's  . Colonoscopy  05/2011    ileo-colonic anastomosis normal, normal mucosa  . Esophagogastroduodenoscopy  05/2011    nl esophagus, gastritis, nl duodenum  . Cervical biopsy  w/ loop electrode excision  10/2008  . Vaginal hysterectomy  12/2008    LAVH/BSO  . Cardiac catheterization  07/2012    normal LV fxn, widely patent coronaries  . Colonoscopy  10/15/2012    focal inflammation at anastomosis, no active  crohn's, planning MR enterograph ([name]  . Shoulder arthroscopy w/ rotator cuff repair Right 05/31/2012    [facility] (Tamera Punt   History  Substance Use Topics  . Smoking status: Current Every Day Smoker -- 0.33 packs/day for 10 years    Types: Cigarettes  . Smokeless tobacco: Never Used  . Alcohol Use: No     Comment: remote EtOh use   Family History  Problem Relation Age of Onset  . Crohn's disease Daughter     crohn's, ulcerative colitis  . Ulcerative colitis Daughter   . Stroke Mother   . Hypertension Mother   . Hyperlipidemia Mother   . Hypertension Father   . Crohn's disease Father   . Crohn's disease Sister   . Cancer Sister     ovarian  . Crohn's disease Paternal Aunt   . Crohn's disease Paternal Uncle   . Diabetes Neg Hx    Allergies  Allergen Reactions  . Codeine Nausea And Vomiting  . Hydrocodone Nausea Only  . Ibuprofen Other (See Comments)    Pt has crohn's disease  . Remeron (Mirtazapine) Other (See Comments)    Knocked out   Current Outpatient Prescriptions on File Prior to Visit  Medication Sig Dispense Refill  . acetaminophen (TYLENOL) 500 MG tablet Take 1,000 mg by mouth every 6 (six) hours as needed. For pain      . amitriptyline (ELAVIL) 50 MG tablet TAKE 1 TABLET (50 MG TOTAL) BY MOUTH AT BEDTIME.  30 tablet  3  . amoxicillin-clavulanate (AUGMENTIN) 875-125 MG per tablet Take 1 tablet by mouth 2 (two) times daily.  20 tablet  0  . citalopram (CELEXA) 40 MG tablet Take 40 mg by mouth every morning.      . cyanocobalamin ([facility] VITAMIN B12) 1000 MCG tablet Take 1 tablet (1,000 mcg total) by mouth daily.      .Marland KitchenLORazepam (ATIVAN) 1 MG tablet TAKE 1 TABLET BY MOUTH 3 TIMES A DAY AS NEEDED FOR ANXIETY  90 tablet  0  . mercaptopurine (PURINETHOL) 50 MG tablet Take 75 mg by mouth daily. Give on an empty stomach 1 hour before or 2 hours after meals. Caution: Chemotherapy.      . metoprolol succinate (TOPROL-XL) 50 MG 24 hr tablet Take 50 mg by mouth at  bedtime.      . promethazine (PHENERGAN) 25 MG tablet Take 1 tablet (25 mg total) by mouth every 6 (six) hours as needed for nausea.  30 tablet  2  . valACYclovir (VALTREX) 500 MG tablet TAKE 1 TABLET BY MOUTH EVERY DAY  30 tablet  6  . cyanocobalamin (,VITAMIN B-12,) 1000 MCG/ML injection Inject 1,000 mcg into the muscle every 30 (thirty) days. Last  dose 07/15/2012.       No current facility-administered medications on file prior to visit.    Review of Systems  Constitutional: Negative for fever, chills, activity change, appetite change, fatigue and unexpected weight change.  HENT: Negative for hearing loss and neck pain.   Eyes: Negative for visual disturbance.  Respiratory: Negative for cough, chest tightness, shortness of breath and wheezing.   Cardiovascular: Negative for chest pain, palpitations and leg swelling.  Gastrointestinal: Negative for nausea, vomiting, abdominal pain, diarrhea, constipation, blood in stool and abdominal distention.  Genitourinary: Negative for hematuria and difficulty urinating.  Musculoskeletal: Negative for myalgias and arthralgias.  Skin: Negative for rash.  Neurological: Negative for dizziness, seizures, syncope and headaches.  Hematological: Negative for adenopathy. Does not bruise/bleed easily.  Psychiatric/Behavioral: Negative for dysphoric mood. The patient is not nervous/anxious.        Objective:   Physical Exam  Nursing note and vitals reviewed. Constitutional: She is oriented to person, place, and time. She appears well-developed and well-nourished. No distress.  HENT:  Head: Normocephalic and atraumatic.  Right Ear: Hearing, external ear and ear canal normal.  Left Ear: Hearing, external ear and ear canal normal.  Nose: Nose normal.  Mouth/Throat: Oropharynx is clear and moist. No oropharyngeal exudate.  Thickened TMs bilaterally, some fluid behind ears bilaterally  Eyes: Conjunctivae and EOM are normal. Pupils are equal, round, and  reactive to light. No scleral icterus.  Neck: Normal range of motion. Neck supple. No thyromegaly present.  Cardiovascular: Normal rate, regular rhythm, normal heart sounds and intact distal pulses.   No murmur heard. Pulses:      Radial pulses are 2+ on the right side, and 2+ on the left side.  Pulmonary/Chest: Effort normal and breath sounds normal. No respiratory distress. She has no wheezes. She has no rales. Right breast exhibits no inverted nipple, no mass, no nipple discharge, no skin change and no tenderness. Left breast exhibits inverted nipple. Left breast exhibits no mass, no nipple discharge, no skin change and no tenderness. Breasts are symmetrical.  Abdominal: Soft. Bowel sounds are normal. She exhibits no distension and no mass. There is no tenderness. There is no rebound and no guarding.  Genitourinary: Vagina normal. Pelvic exam was performed with patient supine. There is no rash, tenderness, lesion or injury on the right labia. There is no rash, tenderness, lesion or injury on the left labia. Right adnexum displays no mass, no tenderness and no fullness. Left adnexum displays no mass, no tenderness and no fullness.  Pap performed on vaginal cuff, no cervix.  Uterus absent  Musculoskeletal: Normal range of motion. She exhibits no edema.  Lymphadenopathy:    She has no cervical adenopathy.    She has no axillary adenopathy.       Right axillary: No lateral adenopathy present.       Left axillary: No lateral adenopathy present.      Right: No supraclavicular adenopathy present.       Left: No supraclavicular adenopathy present.  Neurological: She is alert and oriented to person, place, and time.  CN grossly intact, station and gait intact  Skin: Skin is warm and dry. No rash noted.  Psychiatric: She has a normal mood and affect. Her behavior is normal. Judgment and thought content normal.       Assessment & Plan:

## 2013-02-15 NOTE — Addendum Note (Signed)
Addended by: Royann Shivers A on: 02/15/2013 04:38 PM   Modules accepted: Orders

## 2013-02-15 NOTE — Patient Instructions (Signed)
Keep an eye on simples carbs and added sugars/sweets (sodas and sweet tea) Return in 3 months to recheck sugar levels. Good to see you today, call us with questions. Return as needed.

## 2013-02-15 NOTE — Assessment & Plan Note (Signed)
Preventative protocols reviewed and updated unless pt declined. Discussed healthy diet and lifestyle. Breast exam and pelvic/pap (no cervix) performed today.

## 2013-02-20 ENCOUNTER — Other Ambulatory Visit: Payer: Self-pay | Admitting: Family Medicine

## 2013-02-20 NOTE — Telephone Encounter (Signed)
Ok to refill 

## 2013-02-21 ENCOUNTER — Encounter: Payer: Self-pay | Admitting: *Deleted

## 2013-02-26 ENCOUNTER — Encounter: Payer: Self-pay | Admitting: Family Medicine

## 2013-03-06 ENCOUNTER — Other Ambulatory Visit: Payer: Self-pay

## 2013-03-06 MED ORDER — ZOLPIDEM TARTRATE 5 MG PO TABS: 5.0000 mg | ORAL_TABLET | Freq: Every evening | ORAL | Status: DC | PRN

## 2013-03-06 NOTE — Telephone Encounter (Signed)
Pt request refill on ambien to [facility] [place]; pt having problem getting to sleep and then waking up during the night and cannot go back to sleep.Please advise.

## 2013-03-06 NOTE — Telephone Encounter (Signed)
plz phone in. 

## 2013-03-06 NOTE — Telephone Encounter (Signed)
Rx called in as directed.   

## 2013-03-08 ENCOUNTER — Other Ambulatory Visit: Payer: Self-pay | Admitting: Family Medicine

## 2013-03-08 NOTE — Telephone Encounter (Signed)
Electronic refill request. Please advise.

## 2013-03-10 NOTE — Telephone Encounter (Signed)
plz phone in. 

## 2013-03-11 ENCOUNTER — Other Ambulatory Visit: Payer: Self-pay | Admitting: Family Medicine

## 2013-03-11 NOTE — Telephone Encounter (Signed)
Rx called in as directed.   

## 2013-03-18 ENCOUNTER — Other Ambulatory Visit (INDEPENDENT_AMBULATORY_CARE_PROVIDER_SITE_OTHER): Payer: BC Managed Care – PPO

## 2013-03-18 DIAGNOSIS — E538 Deficiency of other specified B group vitamins: Secondary | ICD-10-CM

## 2013-03-18 DIAGNOSIS — K5090 Crohn's disease, unspecified, without complications: Secondary | ICD-10-CM

## 2013-03-19 ENCOUNTER — Encounter: Payer: Self-pay | Admitting: *Deleted

## 2013-03-25 ENCOUNTER — Telehealth: Payer: Self-pay

## 2013-03-25 DIAGNOSIS — K760 Fatty (change of) liver, not elsewhere classified: Secondary | ICD-10-CM

## 2013-03-25 NOTE — Telephone Encounter (Signed)
Pt left v/m; pt got a letter from insurance co that she may have chronic liver disease; pt request cb. Called pt and she has never been told had a liver disease. Pt wants to know more info. Pt said few weeks ago was told  liver function was fine.Please advise.

## 2013-03-25 NOTE — Telephone Encounter (Signed)
Spoke with patient and assured her that based on previous recent lab results (April and June)-her liver enzymes were stable. I also assured her that if you suspected any type of liver abnormality, you would be doing further testing/referrals. I advised her that I wasn't sure what the letter was referring to-maybe prevention(?) of liver disease. I advised her to drop off letter if she was by the office and I would be happy to review it with her to see if I could help determine what exactly they were meaning by sending it to her. She verbalized understanding and said if she was by here one day, she would bring it by, but that she trusted your judgement and was just confused.

## 2013-03-25 NOTE — Telephone Encounter (Signed)
plz drop off form for me to review.  She may benefit from Hep A/B vaccines if not already done. She does have fatty liver disease which can be a cause of chronic liver disease - but so far her liver functions have stayed normal.   Regardless merits close f/u.

## 2013-03-26 ENCOUNTER — Telehealth: Payer: Self-pay | Admitting: *Deleted

## 2013-03-26 NOTE — Telephone Encounter (Signed)
Letter from insurance in your IN box for review.

## 2013-03-26 NOTE — Telephone Encounter (Signed)
Patient notified and will bring letter by for your review.

## 2013-03-27 ENCOUNTER — Ambulatory Visit: Payer: BC Managed Care – PPO

## 2013-03-27 NOTE — Telephone Encounter (Signed)
See prior phone note.

## 2013-04-01 ENCOUNTER — Other Ambulatory Visit: Payer: Self-pay | Admitting: *Deleted

## 2013-04-01 MED ORDER — ZOLPIDEM TARTRATE 10 MG PO TABS: 10.0000 mg | ORAL_TABLET | Freq: Every evening | ORAL | Status: DC | PRN

## 2013-04-01 NOTE — Telephone Encounter (Signed)
Ok to refill? Insurance will only pay for 15 at a time.

## 2013-04-01 NOTE — Telephone Encounter (Signed)
plz notify I reviewed letter from insurance - she has fatty liver - may benefit from Hep A/B vaccines if not done in past (I don't think she's had these before) plz schedule if pt desires.

## 2013-04-01 NOTE — Telephone Encounter (Signed)
Rx called in as directed. Patient aware to cut pills in half.

## 2013-04-01 NOTE — Telephone Encounter (Signed)
plz phone in. I will send in 54m dose, but pt needs to cut in half each bedtime.

## 2013-04-02 ENCOUNTER — Ambulatory Visit: Payer: BC Managed Care – PPO

## 2013-04-02 NOTE — Telephone Encounter (Signed)
Patient scheduled today for nurse visit.

## 2013-04-03 ENCOUNTER — Ambulatory Visit: Payer: BC Managed Care – PPO

## 2013-04-03 ENCOUNTER — Ambulatory Visit (INDEPENDENT_AMBULATORY_CARE_PROVIDER_SITE_OTHER): Payer: BC Managed Care – PPO | Admitting: *Deleted

## 2013-04-03 DIAGNOSIS — Z23 Encounter for immunization: Secondary | ICD-10-CM

## 2013-04-05 ENCOUNTER — Telehealth: Payer: Self-pay | Admitting: *Deleted

## 2013-04-05 MED ORDER — ZOLPIDEM TARTRATE 5 MG PO TABS: 5.0000 mg | ORAL_TABLET | Freq: Every evening | ORAL | Status: DC | PRN

## 2013-04-05 NOTE — Telephone Encounter (Signed)
ambien 10 not covered by insurance.  plz phone in ambien 58m daily.

## 2013-04-05 NOTE — Telephone Encounter (Signed)
PA for Ambien in your IN box for completion.

## 2013-04-08 NOTE — Telephone Encounter (Signed)
Rx called in as directed.   

## 2013-04-09 ENCOUNTER — Other Ambulatory Visit: Payer: Self-pay | Admitting: Family Medicine

## 2013-04-09 NOTE — Telephone Encounter (Signed)
Ok to refill 

## 2013-04-10 ENCOUNTER — Other Ambulatory Visit: Payer: Self-pay | Admitting: Family Medicine

## 2013-04-10 NOTE — Telephone Encounter (Signed)
plz phone in. 

## 2013-04-10 NOTE — Telephone Encounter (Signed)
Phoned in to pharmacy.

## 2013-04-24 ENCOUNTER — Ambulatory Visit: Payer: BC Managed Care – PPO | Admitting: Family Medicine

## 2013-04-25 ENCOUNTER — Ambulatory Visit: Payer: BC Managed Care – PPO | Admitting: Family Medicine

## 2013-04-26 ENCOUNTER — Other Ambulatory Visit: Payer: Self-pay | Admitting: Family Medicine

## 2013-04-28 LAB — HEPATITIS B E ANTIGEN: Hepatitis Be Antigen: NONREACTIVE

## 2013-04-29 LAB — HEPATITIS PANEL, ACUTE
Hep B C IgM: NEGATIVE
Hepatitis C Ab: NEGATIVE

## 2013-05-01 LAB — BASIC METABOLIC PANEL
Glucose: 112
Potassium: 3.5 mmol/L

## 2013-05-02 LAB — CBC: platelet count: 300

## 2013-05-08 ENCOUNTER — Other Ambulatory Visit: Payer: Self-pay | Admitting: Family Medicine

## 2013-05-09 ENCOUNTER — Ambulatory Visit (INDEPENDENT_AMBULATORY_CARE_PROVIDER_SITE_OTHER): Payer: No Typology Code available for payment source | Admitting: Family Medicine

## 2013-05-09 ENCOUNTER — Encounter: Payer: Self-pay | Admitting: Family Medicine

## 2013-05-09 VITALS — BP 128/82 | HR 84 | Temp 98.2°F | Wt 163.0 lb

## 2013-05-09 DIAGNOSIS — K5090 Crohn's disease, unspecified, without complications: Secondary | ICD-10-CM

## 2013-05-09 DIAGNOSIS — R7309 Other abnormal glucose: Secondary | ICD-10-CM

## 2013-05-09 DIAGNOSIS — R739 Hyperglycemia, unspecified: Secondary | ICD-10-CM | POA: Insufficient documentation

## 2013-05-09 LAB — BASIC METABOLIC PANEL
CO2: 29 mEq/L (ref 19–32)
Glucose, Bld: 120 mg/dL — ABNORMAL HIGH (ref 70–99)
Potassium: 3.6 mEq/L (ref 3.5–5.1)
Sodium: 140 mEq/L (ref 135–145)

## 2013-05-09 NOTE — Telephone Encounter (Signed)
plz phone in. 

## 2013-05-09 NOTE — Telephone Encounter (Signed)
Rx's called in as directed.

## 2013-05-09 NOTE — Progress Notes (Signed)
Subjective:    Patient ID: Sharon [name], female    DOB: 03-10-1956, 57 y.o.   MRN: [id number]  HPI CC: recheck glucose  Recently elevated sugar level found on physical 12/2012.  Crohn's - Dr. Koleen Distance at [facility]. Recent check for C diff. Recent crohn's flare - led to hospitalization at [facility].  8/24-05/03/2013.  Concern for obstruction.  Feeling better now.  Stool - loose stools but better. Currently on 36m prednisone.  Slow taper off. [facility]  Has f/u appt with GI tomorrow. Several mildly elevated glucose - 110-130s (unclear if fasting) I have asked to input into chart. Lab Results  Component Value Date   HGBA1C 5.4 08/01/2012    Wt Readings from Last 3 Encounters:  02/15/13 170 lb 4 oz (77.225 kg)  02/11/13 168 lb (76.204 kg)  12/18/12 166 lb 8 oz (75.524 kg)  Body mass index is 30.81 kg/(m^2).   Past Medical History  Diagnosis Date  . HTN (hypertension)   . Migraines     and frequent other headaches (sinus or stress)  . Crohn's disease     h/o stenotic crohn's, active in TI, PPD neg ([facility] Dr. EOletta Lamas; has been on cimzia, remicade, mercaptopurine, entocort  . Cervical cancer 12/2008    s/p hysterectomy  . History of chicken pox   . Anxiety and depression   . History of anemia     attributed to crohn's  . Arthritis   . Genital warts   . History of syphilis 1980s  . Tobacco abuse   . [facility]ient [facility]) [facility]    [facility] (w/u negative - carotids, echo, TC doppler, and MRI normal)  . Hepatic steatosis 04/2011    diffuse on CT, 578mgallbladder polyp  . B12 deficiency     pernicious anemia - monthly b12 shots  . Scalp psoriasis   . GERD (gastroesophageal reflux disease)     severe, daily sxs if off PPI  . Takotsubo syndrome 07/2012    cath 08/01/12, normal coronaries, LVEF 65%  . Sensorineural hearing loss of both ears  12/2012    high freq   Past Surgical History  Procedure Laterality Date  . Appendectomy  1998  . Right colectomy  2000    crohn's disease, ileocecal resection  . Hospitalization  05/2010    TIA vs complex migraine - w/u negative - head CT, MRI/MRA, carotid dopplers, echo.  treated with ASA and tramadol  . [place]  05/2010    nl LV fxn ,EF 60%  . Abd [place]9/2012    diffuse hepatic steatosis, 34m71molyp in gallbaldder fundus, no stones, mod abd ath  . Colonoscopy  10/2008    ileo-colonic anastomosis, ielocolonic crohn's  . Colonoscopy  05/2011    ileo-colonic anastomosis normal, normal mucosa  . Esophagogastroduodenoscopy  05/2011    nl esophagus, gastritis, nl duodenum  . Cervical biopsy  w/ loop electrode excision  10/2008  . Vaginal hysterectomy  12/2008    LAVH/BSO  . Cardiac catheterization  07/2012    normal LV fxn, widely patent coronaries  . Colonoscopy  10/15/2012    focal inflammation at anastomosis, no active crohn's, planning MR enterograph (EdOletta Lamas. Shoulder arthroscopy w/ rotator cuff repair Right 05/31/2012    [facility] (ChTamera Punt Review of Systems Per HPI    Objective:   Physical Exam  Nursing note and vitals reviewed. Constitutional: She appears well-developed and well-nourished. No distress.  HENT:  Mouth/Throat: Oropharynx is clear and moist. No oropharyngeal exudate.  Cardiovascular: Normal rate, regular rhythm, normal heart sounds and intact distal pulses.   No murmur heard. Pulmonary/Chest: Effort normal and breath sounds normal. No respiratory distress. She has no wheezes. She has no rales.  Abdominal: Soft. Normal appearance and bowel sounds are normal. She exhibits no distension and no mass. There is no hepatosplenomegaly. There is tenderness (mild) in the right lower quadrant. There is no rigidity, no rebound, no guarding and negative Murphy's sign.  Musculoskeletal: She exhibits no edema.  Skin: Skin is warm and dry. No rash noted.   Psychiatric: She has a normal mood and affect.       Assessment & Plan:

## 2013-05-09 NOTE — Patient Instructions (Addendum)
Try to decrease omeprazole to once a day (and touch base with Dr. Renee Harder about this). Good to see you today, call us with questions Blood work today. Let's hold flu shot until you're on lower dose of prednisone (check with Dr. Renee Harder as well)

## 2013-05-09 NOTE — Assessment & Plan Note (Signed)
Recent flare.  Has f/u with GI scheduled. Currently on prednisone 26m daily - will defer flu shot for now.

## 2013-05-09 NOTE — Assessment & Plan Note (Signed)
Check A1c today (in setting of current prednisone use). Discussed importance of avoiding sweetened beverages and simple carbs/ added sugars especially while on prednisone. Lab Results  Component Value Date   HGBA1C 5.4 08/01/2012

## 2013-05-10 ENCOUNTER — Encounter: Payer: Self-pay | Admitting: *Deleted

## 2013-05-15 ENCOUNTER — Encounter: Payer: Self-pay | Admitting: *Deleted

## 2013-05-15 ENCOUNTER — Other Ambulatory Visit: Payer: Self-pay | Admitting: Family Medicine

## 2013-05-15 NOTE — Telephone Encounter (Signed)
Rx called in as directed.   

## 2013-05-15 NOTE — Telephone Encounter (Signed)
plz phone in. 

## 2013-05-15 NOTE — Telephone Encounter (Signed)
Ok to refill 

## 2013-05-16 ENCOUNTER — Encounter: Payer: Self-pay | Admitting: Family Medicine

## 2013-05-21 ENCOUNTER — Telehealth: Payer: Self-pay

## 2013-05-21 NOTE — Telephone Encounter (Signed)
Pt left v/m; pt saw Dr Darnell Level on 05/09/13 and pt said Dr Darnell Level was concerned about pt taking omeprazole with celexa; pt spoke with GI doctor and pt said she can cut the omeprazole in half or another med could be prescribed. Pt said she is going to try taking omeprazole once daily and see how she does. Just wanted Dr Darnell Level to know.Please advise.

## 2013-05-21 NOTE — Telephone Encounter (Signed)
Noted.  Just keep [place] updated.

## 2013-05-22 ENCOUNTER — Telehealth: Payer: Self-pay | Admitting: *Deleted

## 2013-05-22 MED ORDER — NYSTATIN 100000 UNIT/ML MT SUSP: 500000.0000 [IU] | Freq: Four times a day (QID) | OROMUCOSAL | Status: DC

## 2013-05-22 NOTE — Telephone Encounter (Signed)
Patient asking for refill on nystatin swish for thrush. She said her mouth is "on fire" again with a white coating on her tongue and blisters. Will this be okay? It's been a year since last refill?

## 2013-05-22 NOTE — Telephone Encounter (Signed)
Neesa notified prescription has been sent to her pharmacy.

## 2013-05-22 NOTE — Telephone Encounter (Signed)
yes

## 2013-06-04 ENCOUNTER — Ambulatory Visit (INDEPENDENT_AMBULATORY_CARE_PROVIDER_SITE_OTHER): Payer: No Typology Code available for payment source | Admitting: *Deleted

## 2013-06-04 DIAGNOSIS — Z23 Encounter for immunization: Secondary | ICD-10-CM

## 2013-06-06 ENCOUNTER — Ambulatory Visit (INDEPENDENT_AMBULATORY_CARE_PROVIDER_SITE_OTHER): Payer: No Typology Code available for payment source | Admitting: Family Medicine

## 2013-06-06 ENCOUNTER — Encounter: Payer: Self-pay | Admitting: Family Medicine

## 2013-06-06 VITALS — BP 124/78 | HR 110 | Temp 98.2°F | Wt 164.0 lb

## 2013-06-06 DIAGNOSIS — F341 Dysthymic disorder: Secondary | ICD-10-CM

## 2013-06-06 DIAGNOSIS — F32A Depression, unspecified: Secondary | ICD-10-CM

## 2013-06-06 DIAGNOSIS — F419 Anxiety disorder, unspecified: Secondary | ICD-10-CM

## 2013-06-06 DIAGNOSIS — F329 Major depressive disorder, single episode, unspecified: Secondary | ICD-10-CM

## 2013-06-06 MED ORDER — DIAZEPAM 5 MG PO TABS: 5.0000 mg | ORAL_TABLET | Freq: Two times a day (BID) | ORAL | Status: DC | PRN

## 2013-06-06 NOTE — Progress Notes (Signed)
  Subjective:    Patient ID: Sharon [name], female    DOB: 08/25/1956, 57 y.o.   MRN: [id number]  HPI CC: discuss anxiety  Off prednisone.  Stopped ambien - was not helping with sleep.  Continues to awaken every 3 hours throughout the night.  Anxiety - takes celexa 20m daily as well as amitriptyline 5673mnightly.  Has continued ativan 73m15mid.  Feels this helps but quickly wears off.  Stays on edge, jumpy, tremors.  Thinks some stress coming from school/testing.  Some feelings of something bad about to happen.  No SI/HI.  Has grown tolerant to lorazepam. Relaxation - nothing really.  Taking new class - discussing biofeedback and relaxation techniques.  Counseling did help in the past, but feels not able to afford as she's currently not working.  Past Medical History  Diagnosis Date  . HTN (hypertension)   . Migraines     and frequent other headaches (sinus or stress)  . Crohn's disease 2000    h/o stenotic crohn's, active in TI, PPD neg ([facility] Dr. EdwOletta Lamashas been on cimzia, remicade, mercaptopurine, entocort  . Cervical cancer 12/2008    s/p hysterectomy  . History of chicken pox   . Anxiety and depression   . History of anemia     attributed to crohn's  . Arthritis   . Genital warts   . History of syphilis 1980s  . Tobacco abuse   . [facility] - carotids, echo, TC doppler, and MRI normal)  . Hepatic steatosis 04/2011    diffuse on CT, 5mm17mllbladder polyp  . B12 deficiency     pernicious anemia - monthly b12 shots  . Scalp psoriasis   . GERD (gastroesophageal reflux disease)     severe, daily sxs if off PPI  . Takotsubo syndrome 07/2012    cath 08/01/12, normal coronaries, LVEF 65%  . Sensorineural hearing loss of both ears 12/2012    high freq     Review of Systems Per HPI    Objective:   Physical Exam  Nursing note and vitals reviewed. Constitutional: She appears  well-developed and well-nourished. No distress.  Psychiatric: She has a normal mood and affect. Her behavior is normal.       Assessment & Plan:

## 2013-06-06 NOTE — Patient Instructions (Addendum)
Let's stop ativan. Start valium - a bit longer acting. Take twice a day, may increase to 3 times a day if needed. Work on IT trainer.

## 2013-06-06 NOTE — Assessment & Plan Note (Addendum)
Continue celexa, amitriptyline. Stop ativan - concern for developing tolerance. Start valium 52m twice daily to tid. Update u[place]ore[place] effect. Discussed counseling - pt declines currently 2/2 financial concerns. Discussed importance of relaxation techniques.

## 2013-07-01 ENCOUNTER — Other Ambulatory Visit: Payer: Self-pay | Admitting: Family Medicine

## 2013-07-02 NOTE — Telephone Encounter (Signed)
Rx called in as directed.   

## 2013-07-02 NOTE — Telephone Encounter (Signed)
plz phone in. 

## 2013-07-29 ENCOUNTER — Other Ambulatory Visit: Payer: Self-pay | Admitting: Family Medicine

## 2013-07-29 NOTE — Telephone Encounter (Signed)
Ok to refill 

## 2013-07-29 NOTE — Telephone Encounter (Signed)
Rx called in as directed.   

## 2013-07-29 NOTE — Telephone Encounter (Signed)
plz phone in. 

## 2013-08-23 ENCOUNTER — Encounter: Payer: Self-pay | Admitting: Family Medicine

## 2013-08-30 ENCOUNTER — Encounter (HOSPITAL_COMMUNITY): Payer: Self-pay | Admitting: Emergency Medicine

## 2013-08-30 ENCOUNTER — Emergency Department (HOSPITAL_COMMUNITY)
Admission: EM | Admit: 2013-08-30 | Discharge: 2013-08-30 | Disposition: A | Discharge status: Home / Self Care | Payer: BC Managed Care – PPO | Attending: Emergency Medicine | Admitting: Emergency Medicine

## 2013-08-30 DIAGNOSIS — H60399 Other infective otitis externa, unspecified ear: Secondary | ICD-10-CM | POA: Insufficient documentation

## 2013-08-30 DIAGNOSIS — R112 Nausea with vomiting, unspecified: Secondary | ICD-10-CM

## 2013-08-30 DIAGNOSIS — Z792 Long term (current) use of antibiotics: Secondary | ICD-10-CM | POA: Insufficient documentation

## 2013-08-30 DIAGNOSIS — F329 Major depressive disorder, single episode, unspecified: Secondary | ICD-10-CM | POA: Insufficient documentation

## 2013-08-30 DIAGNOSIS — Z8739 Personal history of other diseases of the musculoskeletal system and connective tissue: Secondary | ICD-10-CM | POA: Insufficient documentation

## 2013-08-30 DIAGNOSIS — B372 Candidiasis of skin and nail: Secondary | ICD-10-CM

## 2013-08-30 DIAGNOSIS — K219 Gastro-esophageal reflux disease without esophagitis: Secondary | ICD-10-CM | POA: Insufficient documentation

## 2013-08-30 DIAGNOSIS — F3289 Other specified depressive episodes: Secondary | ICD-10-CM | POA: Insufficient documentation

## 2013-08-30 DIAGNOSIS — J069 Acute upper respiratory infection, unspecified: Secondary | ICD-10-CM

## 2013-08-30 DIAGNOSIS — Z8673 Personal history of transient ischemic attack (TIA), and cerebral infarction without residual deficits: Secondary | ICD-10-CM | POA: Insufficient documentation

## 2013-08-30 DIAGNOSIS — F411 Generalized anxiety disorder: Secondary | ICD-10-CM | POA: Insufficient documentation

## 2013-08-30 DIAGNOSIS — H6093 Unspecified otitis externa, bilateral: Secondary | ICD-10-CM

## 2013-08-30 DIAGNOSIS — R51 Headache: Secondary | ICD-10-CM | POA: Insufficient documentation

## 2013-08-30 DIAGNOSIS — I10 Essential (primary) hypertension: Secondary | ICD-10-CM | POA: Insufficient documentation

## 2013-08-30 DIAGNOSIS — Z862 Personal history of diseases of the blood and blood-forming organs and certain disorders involving the immune mechanism: Secondary | ICD-10-CM | POA: Insufficient documentation

## 2013-08-30 DIAGNOSIS — Z8541 Personal history of malignant neoplasm of cervix uteri: Secondary | ICD-10-CM | POA: Insufficient documentation

## 2013-08-30 DIAGNOSIS — IMO0001 Reserved for inherently not codable concepts without codable children: Secondary | ICD-10-CM | POA: Insufficient documentation

## 2013-08-30 DIAGNOSIS — E538 Deficiency of other specified B group vitamins: Secondary | ICD-10-CM | POA: Insufficient documentation

## 2013-08-30 DIAGNOSIS — G43909 Migraine, unspecified, not intractable, without status migrainosus: Secondary | ICD-10-CM | POA: Insufficient documentation

## 2013-08-30 DIAGNOSIS — Z79899 Other long term (current) drug therapy: Secondary | ICD-10-CM | POA: Insufficient documentation

## 2013-08-30 DIAGNOSIS — Z872 Personal history of diseases of the skin and subcutaneous tissue: Secondary | ICD-10-CM | POA: Insufficient documentation

## 2013-08-30 DIAGNOSIS — F17200 Nicotine dependence, unspecified, uncomplicated: Secondary | ICD-10-CM | POA: Insufficient documentation

## 2013-08-30 LAB — URINE MICROSCOPIC-ADD ON

## 2013-08-30 LAB — URINALYSIS, ROUTINE W REFLEX MICROSCOPIC
BILIRUBIN URINE: NEGATIVE
Glucose, UA: NEGATIVE mg/dL
Ketones, ur: NEGATIVE mg/dL
NITRITE: NEGATIVE
PH: 5.5 (ref 5.0–8.0)
Protein, ur: NEGATIVE mg/dL
Specific Gravity, Urine: 1.012 (ref 1.005–1.030)
UROBILINOGEN UA: 0.2 mg/dL (ref 0.0–1.0)

## 2013-08-30 MED ORDER — ONDANSETRON 4 MG PO TBDP: ORAL_TABLET | ORAL | Status: DC

## 2013-08-30 MED ORDER — FLUCONAZOLE 150 MG PO TABS: 150.0000 mg | ORAL_TABLET | ORAL | Status: DC

## 2013-08-30 MED ORDER — ONDANSETRON 4 MG PO TBDP
8.0000 mg | ORAL_TABLET | Freq: Once | ORAL | Status: AC
  Administered 2013-08-30: 8 mg via ORAL
  Filled 2013-08-30: qty 2

## 2013-08-30 MED ORDER — DIPHENHYDRAMINE HCL 50 MG/ML IJ SOLN
12.5000 mg | Freq: Once | INTRAMUSCULAR | Status: AC
  Administered 2013-08-30: 12.5 mg via INTRAVENOUS
  Filled 2013-08-30: qty 1

## 2013-08-30 MED ORDER — PROCHLORPERAZINE EDISYLATE 5 MG/ML IJ SOLN
10.0000 mg | Freq: Once | INTRAMUSCULAR | Status: AC
  Administered 2013-08-30: 10 mg via INTRAVENOUS
  Filled 2013-08-30: qty 2

## 2013-08-30 MED ORDER — SODIUM CHLORIDE 0.9 % IV BOLUS (SEPSIS)
1000.0000 mL | Freq: Once | INTRAVENOUS | Status: AC
  Administered 2013-08-30: 1000 mL via INTRAVENOUS

## 2013-08-30 MED ORDER — CIPROFLOXACIN-DEXAMETHASONE 0.3-0.1 % OT SUSP: 4.0000 [drp] | Freq: Two times a day (BID) | OTIC | Status: DC

## 2013-08-30 MED ORDER — ACETAMINOPHEN 325 MG PO TABS
650.0000 mg | ORAL_TABLET | Freq: Once | ORAL | Status: AC
  Administered 2013-08-30: 650 mg via ORAL
  Filled 2013-08-30: qty 2

## 2013-08-30 NOTE — ED Notes (Signed)
The pt is currently having an anxiety attack.  She is here with yeast infection over her body and she has had flu symptoms for the past 2 weeks.  nv diarrhea.   Temp yesterday.  She has been to urgent cares x2 and she reports that they would not see her.  C/o weakness.  Hx crohns disease.  lmp none

## 2013-08-30 NOTE — ED Notes (Signed)
Pt described flu like symptoms for the past 2 days, but today is feeling better, no temperature, but patient is still having abdominal pain, pt has a history of chrons disease

## 2013-08-30 NOTE — ED Provider Notes (Signed)
CSN: [id number]     Arrival date & time 08/30/13  1731 History   None    Chief Complaint  Patient presents with  . multiple complaints    (Consider location/radiation/quality/duration/timing/severity/associated sxs/prior Treatment) HPI 58 yo female presents with 2 day hx of cough, sore throat, ear pain, N/V/D, fever/chills, body aches, and intertriginous yeast infections. Patient went to urgent care but states they were closed. Patient states that she does not want to be here but she had no other choice. Patient states the symptoms have been gradually worsening. Admits to about 4 episodes of vomiting, no hematemesis, and multiple episodes of watery diarrhea, unsure of how many episodes. Admits to generalized crampy abdominal pain. PMH significant for Chron's Disease for which she treats with immunosuppressants, HTN, Migraines.  Past Medical History  Diagnosis Date  . HTN (hypertension)   . Migraines     and frequent other headaches (sinus or stress)  . Crohn's disease 2000    h/o stenotic crohn's, active in TI, PPD neg ([facility] Dr. [name]); has been on cimzia, remicade, mercaptopurine, entocort, now stable on Entyvio and 6MP ([facility] [place])  . Cervical cancer 12/2008    s/p hysterectomy  . History of chicken pox   . Anxiety and depression   . History of anemia     attributed to crohn's  . Arthritis   . Genital warts   . History of syphilis 1980s  . Tobacco abuse   . TIA (transient ischemic attack) 05/2010    at [facility] - TIA vs complex migraine (w/u negative - carotids, echo, TC doppler, and MRI normal)  . Hepatic steatosis 04/2011    diffuse on CT, 12m gallbladder polyp  . B12 deficiency     pernicious anemia - monthly b12 shots  . Scalp psoriasis   . GERD (gastroesophageal reflux disease)     severe, daily sxs if off PPI  . Takotsubo syndrome 07/2012    cath 08/01/12, normal coronaries, LVEF 65%  . Sensorineural hearing loss of both ears 12/2012    high freq   Past  Surgical History  Procedure Laterality Date  . Appendectomy  1998  . Right colectomy  2000    crohn's disease, ileocecal resection  . Hospitalization  05/2010    TIA vs complex migraine - w/u negative - head CT, MRI/MRA, carotid dopplers, echo.  treated with ASA and tramadol  . [place]ocardiography  05/2010    nl LV fxn ,EF 60%  . Abd [place] 04/2011    diffuse hepatic steatosis, 527mpolyp in gallbaldder fundus, no stones, mod abd ath  . Colonoscopy  10/2008    ileo-colonic anastomosis, ielocolonic crohn's  . Colonoscopy  05/2011    ileo-colonic anastomosis normal, normal mucosa  . Esophagogastroduodenoscopy  05/2011    nl esophagus, gastritis, nl duodenum  . Cervical biopsy  w/ loop electrode excision  10/2008  . Vaginal hysterectomy  12/2008    LAVH/BSO  . Cardiac catheterization  07/2012    normal LV fxn, widely patent coronaries  . Colonoscopy  10/15/2012    focal inflammation at anastomosis, no active crohn's, planning MR enterograph (EOletta Lamas . Shoulder arthroscopy w/ rotator cuff repair Right 05/31/2012    [facility] ([facility] Punt  Family History  Problem Relation Age of Onset  . Crohn's disease Daughter     crohn's, ulcerative colitis  . Ulcerative colitis Daughter   . Stroke Mother   . Hypertension Mother   . Hyperlipidemia Mother   .  Hypertension Father   . Crohn's disease Father   . Crohn's disease Sister   . Cancer Sister     ovarian  . Crohn's disease Paternal Aunt   . Crohn's disease Paternal Uncle   . Diabetes Neg Hx    History  Substance Use Topics  . Smoking status: Current Every Day Smoker -- 0.33 packs/day for 10 years    Types: Cigarettes  . Smokeless tobacco: Never Used  . Alcohol Use: No     Comment: remote EtOh use   OB History   Grav Para Term Preterm Abortions TAB SAB Ect Mult Living   4 3   1  1   3      Review of Systems  HENT: Positive for postnasal drip and rhinorrhea.   Respiratory: Negative for shortness of breath.   Cardiovascular:  Negative for chest pain and leg swelling.  Gastrointestinal: Negative for constipation, blood in stool and rectal pain.  Genitourinary: Negative for dysuria, flank pain, vaginal bleeding, vaginal discharge and pelvic pain.  Musculoskeletal: Positive for myalgias.  Skin: Positive for rash.  Neurological: Positive for headaches.  All other systems reviewed and are negative.    Allergies  Codeine; Hydrocodone; Ibuprofen; and Remeron  Home Medications   Current Outpatient Rx  Name  Route  Sig  Dispense  Refill  . amitriptyline (ELAVIL) 50 MG tablet   Oral   Take 50 mg by mouth at bedtime.         . citalopram (CELEXA) 40 MG tablet   Oral   Take 40 mg by mouth daily.          . diazepam (VALIUM) 5 MG tablet   Oral   Take 5 mg by mouth every 12 (twelve) hours as needed for anxiety.         Marland Kitchen KETOCONAZOLE EX   Apply externally   Apply 1 application topically 3 (three) times daily as needed (yeast infection).         . mercaptopurine (PURINETHOL) 50 MG tablet   Oral   Take 75 mg by mouth daily. Give on an empty stomach 1 hour before or 2 hours after meals. Caution: Chemotherapy.         . metoprolol succinate (TOPROL-XL) 50 MG 24 hr tablet   Oral   Take 50 mg by mouth at bedtime. Take with or immediately following a meal.         . omeprazole (PRILOSEC) 40 MG capsule   Oral   Take 40 mg by mouth at bedtime.          . promethazine (PHENERGAN) 25 MG tablet   Oral   Take 25 mg by mouth every 8 (eight) hours as needed for nausea or vomiting.         . valACYclovir (VALTREX) 500 MG tablet   Oral   Take 500 mg by mouth daily.         . vitamin B-12 (CYANOCOBALAMIN) 1000 MCG tablet   Oral   Take 1,000 mcg by mouth daily.         Marland Kitchen zolpidem (AMBIEN) 5 MG tablet   Oral   Take 5 mg by mouth at bedtime as needed for sleep.         . ciprofloxacin-dexamethasone (CIPRODEX) otic suspension   Both Ears   Place 4 drops into both ears 2 (two) times  daily.   7.5 mL   0   . fluconazole (DIFLUCAN) 150 MG tablet   Oral  Take 1 tablet (150 mg total) by mouth once a week.   4 tablet   0   . ondansetron (ZOFRAN ODT) 4 MG disintegrating tablet      67m ODT q4 hours prn nausea/vomit   15 tablet   0    BP 125/78  Pulse 70  Temp(Src) 98.6 F (37 C) (Oral)  Resp 16  Ht 5' 1.5" (1.562 m)  Wt 169 lb (76.658 kg)  BMI 31.42 kg/m2  SpO2 98% Physical Exam  Nursing note and vitals reviewed. Constitutional: She is oriented to person, place, and time. She appears well-developed and well-nourished. No distress.  HENT:  Head: Normocephalic and atraumatic.  Right Ear: There is drainage, swelling and tenderness. No middle ear effusion.  Left Ear: There is drainage, swelling and tenderness.  No middle ear effusion.  Nose: Nose normal.  Mouth/Throat: Uvula is midline. Mucous membranes are dry. No oropharyngeal exudate, posterior oropharyngeal edema or posterior oropharyngeal erythema.  Bilateral white exudated lining the EACs, consistent with otitis externa.  Neck: Normal range of motion. Neck supple. No JVD present.  Cardiovascular: Normal rate and regular rhythm.  Exam reveals no gallop and no friction rub.   No murmur heard. Pulmonary/Chest: Effort normal and breath sounds normal. No respiratory distress. She has no wheezes. She has no rales.  Abdominal: Soft. Bowel sounds are normal. She exhibits no distension and no mass. There is no hepatosplenomegaly. There is tenderness in the suprapubic area. There is no rigidity, no rebound, no guarding, no tenderness at McBurney's point and negative Murphy's sign.  Musculoskeletal: Normal range of motion. She exhibits no edema.  Neurological: She is alert and oriented to person, place, and time.  Skin: Skin is warm and dry. Rash noted. She is not diaphoretic.     Skin shows candidal type rash with erythematous base, sharply demarcated along intertriginous zones of Left axillae, Right breast, and  Right aspect beneath pannus fold. Satellite lesions present.   Psychiatric: She has a normal mood and affect. Her behavior is normal.    ED Course  Procedures (including critical care time) Labs Review Labs Reviewed  URINALYSIS, ROUTINE W REFLEX MICROSCOPIC - Abnormal; Notable for the following:    Hgb urine dipstick SMALL (*)    Leukocytes, UA TRACE (*)    All other components within normal limits  URINE MICROSCOPIC-ADD ON - Abnormal; Notable for the following:    Squamous Epithelial / LPF FEW (*)    All other components within normal limits   Imaging Review No results found.  EKG Interpretation   None       MDM   1. URI (upper respiratory infection)   2. N&V (nausea and vomiting)   3. Otitis externa of both ears   4. Candidal intertrigo    Patient is afebrile with stable VS. UA not consistent with UTI. Patient pain and nausea resolved with tx in the ED. Plan to treat bilateral otitis externa and patient's chronic candidal intertrigo. Doubt Chron's exacerbation. Doubt kidney stones given patient hx and presentation. Patient's pain and nausea resolved with treatment in ED. Advised follow up with PCP on Monday. Patient agrees with this plan. Discharged in good condition.   Meds given in ED:  Medications  ondansetron (ZOFRAN-ODT) disintegrating tablet 8 mg (8 mg Oral Given 08/30/13 1809)  sodium chloride 0.9 % bolus 1,000 mL (0 mLs Intravenous Stopped 08/30/13 2145)  prochlorperazine (COMPAZINE) injection 10 mg (10 mg Intravenous Given 08/30/13 2120)  diphenhydrAMINE (BENADRYL) injection 12.5 mg (12.5 mg  Intravenous Given 08/30/13 2119)  acetaminophen (TYLENOL) tablet 650 mg (650 mg Oral Given 08/30/13 2225)    Discharge Medication List as of 08/30/2013 11:03 PM    START taking these medications   Details  ciprofloxacin-dexamethasone (CIPRODEX) otic suspension Place 4 drops into both ears 2 (two) times daily., Starting 08/30/2013, Until Discontinued, Print    fluconazole (DIFLUCAN)  150 MG tablet Take 1 tablet (150 mg total) by mouth once a week., Starting 08/30/2013, Until Discontinued, Print    ondansetron (ZOFRAN ODT) 4 MG disintegrating tablet 58m ODT q4 hours prn nausea/vomit, Print           JSherrie George PA-C 09/01/13 1429

## 2013-08-30 NOTE — Discharge Instructions (Signed)
Follow up with your PCP on Monday for reevaluation to ensure symptom improvement. Take medications as directed. Return to ED should you develop fever/chills that do not respond to OTC antipyretics, or unable to hold down any fluids.

## 2013-08-30 NOTE — ED Notes (Signed)
PA at bedside.

## 2013-09-01 NOTE — ED Provider Notes (Signed)
  Medical screening examination/treatment/procedure(s) were performed by non-physician practitioner and as supervising physician I was immediately available for consultation/collaboration.  EKG Interpretation   None          Carmin Muskrat, MD 09/01/13 2253

## 2013-09-03 ENCOUNTER — Telehealth: Payer: Self-pay | Admitting: *Deleted

## 2013-09-03 NOTE — Telephone Encounter (Signed)
Ok to use zeasorb but only "excess moisture" one as other ones have antifungal and she's already using ketoconazole cream. Letter written.

## 2013-09-03 NOTE — Telephone Encounter (Signed)
Patient called and said she has upcoming appt with you on 09/06/13 for yeast inf under skin folds like she has had in the past. She ended up going to the ER over the weekend for URI and this was addressed there as well, but she doesn't think the oral diflucan is working as well as it should which is why she is coming in to see you. She was asking what else she could do in the meantime. She said the yeast is very red and painful. I advised her to continue the ketoconazole cream and advised she could try OTC Zeasorb powder to help under the skin folds and try some gauze there as well to help keep those areas dry. She verbalized understanding.  She also advised that she needs a note for her insurance stating that she has seen you to address her work related anxiety and that she also went to counseling for this. She also said that the note needed to say that the stress/anxiety led her to leave her job. She said this could be a very basic note and that it didn't need to go into a lot of detail, but needed to include these things. Your office notes verified all of the above.

## 2013-09-05 NOTE — Telephone Encounter (Signed)
Message left notifying patient.

## 2013-09-06 ENCOUNTER — Ambulatory Visit (INDEPENDENT_AMBULATORY_CARE_PROVIDER_SITE_OTHER): Payer: BC Managed Care – PPO | Admitting: Family Medicine

## 2013-09-06 ENCOUNTER — Encounter: Payer: Self-pay | Admitting: Family Medicine

## 2013-09-06 VITALS — BP 124/72 | HR 84 | Temp 98.4°F | Wt 166.5 lb

## 2013-09-06 DIAGNOSIS — R21 Rash and other nonspecific skin eruption: Secondary | ICD-10-CM

## 2013-09-06 MED ORDER — NYSTATIN-TRIAMCINOLONE 100000-0.1 UNIT/GM-% EX OINT: 1.0000 "application " | TOPICAL_OINTMENT | Freq: Two times a day (BID) | CUTANEOUS | Status: DC

## 2013-09-06 NOTE — Progress Notes (Addendum)
   Subjective:    Patient ID: Sharon [name], female    DOB: November 20, 1955, 58 y.o.   MRN: [id number]  HPI CC: rash  Recent ER visit reviewed.  Multiple concerns - dx with bilateral external otitis, candidal impetigo, URI.  Treated with ciprodex ear drops and diflucan 118m weekly for impetigo.  She has also been using ketoconazole 2% cream given by prior dermatologist.  Rash started 3 wks ago.  Seems to be spreading despite meds. Under arms and breast, in groin region.  Ears improved with treatment.  She is on entyvio every 8 weeks for her history of chrohn's disease.  Past Medical History  Diagnosis Date  . HTN (hypertension)   . Migraines     and frequent other headaches (sinus or stress)  . Crohn's disease 2000    h/o stenotic crohn's, active in TI, PPD neg ([facility] Dr. EOletta Lamas; has been on cimzia, remicade, mercaptopurine, entocort, now stable on Entyvio and 6MP ([facility]  . Cervical cancer 12/2008    s/p hysterectomy  . History of chicken pox   . Anxiety and depression   . History of anemia     attributed to crohn's  . Arthritis   . Genital warts   . History of syphilis 1980s  . Tobacco abuse   . TIA (transient ischemic [facility]) [facility]    [facility] vs complex migraine (w/u negative - carotids, echo, TC doppler, and MRI normal)  . Hepatic steatosis 04/2011    diffuse on CT, 553mgallbladder polyp  . B12 deficiency     pernicious anemia - monthly b12 shots  . Scalp psoriasis   . GERD (gastroesophageal reflux disease)     severe, daily sxs if off PPI  . Takotsubo syndrome 07/2012    cath 08/01/12, normal coronaries, LVEF 65%  . Sensorineural hearing loss of both ears 12/2012    high freq     Review of Systems Per HPI    Objective:   Physical Exam  Nursing note and vitals reviewed. Constitutional: She appears well-developed and well-nourished. No distress.  Skin: Skin is warm and dry. Rash noted.     Inflamed erythematous rash at intertriginous  areas of R breast, R axilla and L>R groin.  + satellite lesions.  + maceration and breaks in skin at skin folds.       Assessment & Plan:

## 2013-09-06 NOTE — Assessment & Plan Note (Addendum)
Consistent with candidal intertrigo but diffusely (entyvio may increase risk of this).  ?inverse psoriasis... Agree with continued diflucan.  Will add on mycolog to use bid x 2 wks max at a time (instead of ketoconazole), discussed steroid precautions on skin. If not better, consider referral to derm. Pt agrees with plan.

## 2013-09-06 NOTE — Progress Notes (Signed)
Pre-visit discussion using our clinic review tool. No additional management support is needed unless otherwise documented below in the visit note.  

## 2013-09-06 NOTE — Patient Instructions (Signed)
Finish diflucan course. Stop ketoconazole cream. Start steroid-antifungal combination cream twice daily to affected area, use for a maximum of 2 weeks at a time. Let me know if not improving with this change. Good to see you today, call us with questions.

## 2013-09-26 ENCOUNTER — Other Ambulatory Visit: Payer: Self-pay | Admitting: Family Medicine

## 2013-09-27 NOTE — Telephone Encounter (Signed)
plz phone in. 

## 2013-09-27 NOTE — Telephone Encounter (Signed)
Rx called in as directed.   

## 2013-10-08 ENCOUNTER — Ambulatory Visit (INDEPENDENT_AMBULATORY_CARE_PROVIDER_SITE_OTHER): Payer: BC Managed Care – PPO

## 2013-10-08 DIAGNOSIS — Z23 Encounter for immunization: Secondary | ICD-10-CM

## 2013-10-11 ENCOUNTER — Ambulatory Visit: Payer: BC Managed Care – PPO | Admitting: Internal Medicine

## 2013-11-03 ENCOUNTER — Other Ambulatory Visit: Payer: Self-pay | Admitting: Family Medicine

## 2013-11-11 ENCOUNTER — Other Ambulatory Visit: Payer: Self-pay | Admitting: Family Medicine

## 2013-11-28 ENCOUNTER — Other Ambulatory Visit: Payer: Self-pay | Admitting: Family Medicine

## 2013-11-28 NOTE — Telephone Encounter (Signed)
plz phone in. 

## 2013-11-28 NOTE — Telephone Encounter (Signed)
Rx phoned in to pharmacy.

## 2013-12-03 ENCOUNTER — Other Ambulatory Visit: Payer: Self-pay

## 2013-12-03 DIAGNOSIS — Z1231 Encounter for screening mammogram for malignant neoplasm of breast: Secondary | ICD-10-CM

## 2013-12-09 ENCOUNTER — Ambulatory Visit: Payer: BC Managed Care – PPO

## 2013-12-26 ENCOUNTER — Other Ambulatory Visit: Payer: Self-pay | Admitting: Family Medicine

## 2013-12-26 ENCOUNTER — Telehealth: Payer: Self-pay

## 2013-12-26 MED ORDER — DIAZEPAM 5 MG PO TABS: 5.0000 mg | ORAL_TABLET | Freq: Three times a day (TID) | ORAL | Status: DC | PRN

## 2013-12-26 NOTE — Telephone Encounter (Signed)
plz phone in with new # and new sig.

## 2013-12-26 NOTE — Telephone Encounter (Signed)
Rx called in as directed.   

## 2013-12-26 NOTE — Telephone Encounter (Signed)
Ok to refill 

## 2013-12-26 NOTE — Telephone Encounter (Signed)
plz phone in. 

## 2013-12-26 NOTE — Telephone Encounter (Signed)
Rx called in as directed and patient notified.

## 2013-12-26 NOTE — Telephone Encounter (Signed)
Pt left v/m; pt has discussed previously with Dr Danise Mina about her taking diazepam 5 mg three times daily if needed.Pt said couple of times pt has taken tid and now [facility] will not refill until 12/28/13. Pt request instructions changed to three times a day as needed for diazepam 5 mg. Pt request cb.

## 2014-01-06 ENCOUNTER — Ambulatory Visit: Payer: BC Managed Care – PPO | Admitting: Family Medicine

## 2014-01-22 ENCOUNTER — Other Ambulatory Visit: Payer: Self-pay | Admitting: Family Medicine

## 2014-01-22 NOTE — Telephone Encounter (Signed)
Ok to refill 

## 2014-01-23 NOTE — Telephone Encounter (Signed)
plz phone in. 

## 2014-01-23 NOTE — Telephone Encounter (Signed)
Rx called in as directed.   

## 2014-02-17 ENCOUNTER — Other Ambulatory Visit: Payer: Self-pay | Admitting: Family Medicine

## 2014-02-17 NOTE — Telephone Encounter (Signed)
plz phone in. 

## 2014-02-17 NOTE — Telephone Encounter (Signed)
Rx called in as prescribed 

## 2014-02-17 NOTE — Telephone Encounter (Signed)
Ok to refill 

## 2014-02-20 DIAGNOSIS — H2513 Age-related nuclear cataract, bilateral: Secondary | ICD-10-CM | POA: Insufficient documentation

## 2014-02-20 DIAGNOSIS — H53122 Transient visual loss, left eye: Secondary | ICD-10-CM | POA: Insufficient documentation

## 2014-03-12 ENCOUNTER — Telehealth: Payer: Self-pay

## 2014-03-12 NOTE — Telephone Encounter (Signed)
Pt left v/m requesting TB skin test for work. Left message for pt to cb.

## 2014-03-17 ENCOUNTER — Other Ambulatory Visit: Payer: Self-pay | Admitting: Family Medicine

## 2014-03-18 NOTE — Telephone Encounter (Signed)
Spoke with pt and pt does not have ins now and will get TB skin test possibly at health dept. Pt said would cb if anything else needed.

## 2014-03-20 ENCOUNTER — Telehealth: Payer: Self-pay

## 2014-03-20 NOTE — Telephone Encounter (Signed)
Pt left v/m; pt does not have insurance and pt is trying to get approved for medication assistance and pt has ran out of meds and cannot afford to buy meds and pt wants to know if we have any samples available. Advised pt do not have samples of meds but have some discount cards; pt said she cannot afford to pay a discounted amount. Pt wants to know if we can help pt get enrolled in pt assistance programs thru the pharmaceutical cos. Pt request cb.

## 2014-03-20 NOTE — Telephone Encounter (Signed)
Spoke with patient and advised her that she will have to apply for patient assistance herself and if we needed to sign anything to bring [place] the paperwork. Advised her they will require proof of income so we cannot apply for her.

## 2014-03-25 ENCOUNTER — Telehealth: Payer: Self-pay | Admitting: *Deleted

## 2014-03-25 MED ORDER — DIAZEPAM 5 MG PO TABS: ORAL_TABLET | ORAL | Status: DC

## 2014-03-25 MED ORDER — METOPROLOL SUCCINATE ER 50 MG PO TB24: ORAL_TABLET | ORAL | Status: DC

## 2014-03-25 MED ORDER — CITALOPRAM HYDROBROMIDE 40 MG PO TABS: ORAL_TABLET | ORAL | Status: DC

## 2014-03-25 NOTE — Telephone Encounter (Signed)
90d supply printed and placed in Kim's box. 30d supply for controlled substance.

## 2014-03-25 NOTE — Telephone Encounter (Signed)
Patient has applied for patient assistance for some of her medications through [facility]. She said they will require a written script faxed to them at [phone] for metoprolol, Diazepam and Citalopram.

## 2014-03-26 NOTE — Telephone Encounter (Signed)
Rx's faxed. Message left notifying patient.

## 2014-04-01 ENCOUNTER — Other Ambulatory Visit: Payer: Self-pay

## 2014-04-01 MED ORDER — DIAZEPAM 5 MG PO TABS: ORAL_TABLET | ORAL | Status: DC

## 2014-04-01 MED ORDER — CITALOPRAM HYDROBROMIDE 40 MG PO TABS: ORAL_TABLET | ORAL | Status: DC

## 2014-04-01 MED ORDER — AMITRIPTYLINE HCL 50 MG PO TABS: 50.0000 mg | ORAL_TABLET | Freq: Every day | ORAL | Status: DC

## 2014-04-01 MED ORDER — METOPROLOL SUCCINATE ER 50 MG PO TB24: ORAL_TABLET | ORAL | Status: DC

## 2014-04-01 NOTE — Telephone Encounter (Signed)
Please call in the diazepam.  I sent the others.  Thanks. Have her try to f/u with PCP when possible.

## 2014-04-01 NOTE — Telephone Encounter (Signed)
Pt walked in crying; pt states she has been without med for 3 1/2 weeks; has sent in prescriptions for med assistance but will take 7-10 days to receive med in mail. Pt has not been sleeping, feeling anxious, stomach pains, h/a, but today felt disorientated and dizzy on and off. BP earlier today 136/90 P 99. Pt cannot afford to be seen and cannot afford UC; pt wants to get Metoprolol, Citalopram,diazepam and Amitriptyline to [facility]. Pt request cb today.

## 2014-04-01 NOTE — Telephone Encounter (Signed)
Medication phoned to pharmacy. Patient advised.  

## 2014-04-02 ENCOUNTER — Telehealth: Payer: Self-pay | Admitting: *Deleted

## 2014-04-02 NOTE — Telephone Encounter (Signed)
Form for patient assistance for Amitriptyline in your IN box for completion. Dr. Damita Dunnings RF #[id number] 04/01/14. Ok to wait for you to return.

## 2014-04-07 NOTE — Telephone Encounter (Signed)
Paperwork faxed. Message left notifying patient

## 2014-04-07 NOTE — Telephone Encounter (Signed)
signed and placed in Kim's box.

## 2014-06-05 ENCOUNTER — Telehealth: Payer: Self-pay

## 2014-06-05 MED ORDER — DIAZEPAM 5 MG PO TABS: ORAL_TABLET | ORAL | Status: DC

## 2014-06-05 NOTE — Telephone Encounter (Signed)
plz notify I don't do 90d supply of controlled substances. May phone in to local pharmacy.

## 2014-06-05 NOTE — Telephone Encounter (Signed)
She didn't want a 90 day supply. Only a 10 day supply to local pharmacy until the 30 day arrived from the mail order patient assistance. This will require a written script to be faxed over to the patient assistance. We have done this for her in the past.

## 2014-06-05 NOTE — Telephone Encounter (Signed)
Pt left v/m requesting Diazepam rx faxed to [facility]; fax # [phone] with cover sheet. Pt request 10 day supply at [facility] until receives mail order rx. Please advise.

## 2014-06-06 MED ORDER — DIAZEPAM 5 MG PO TABS: ORAL_TABLET | ORAL | Status: DC

## 2014-06-06 NOTE — Telephone Encounter (Signed)
Rx faxed and called in as directed. Message left notifying patient.

## 2014-06-06 NOTE — Telephone Encounter (Signed)
Printed with 3 refills and placed in Kim's box. May phone in 10d supply to local pharmacy.

## 2014-06-12 NOTE — Telephone Encounter (Signed)
Pt left v/m; [facility] will contact Dr Synthia Innocent office for another rx for 30 day supply;previous rx to rx outreach was for 10 days. Pt request cb.

## 2014-06-13 MED ORDER — DIAZEPAM 5 MG PO TABS: ORAL_TABLET | ORAL | Status: DC

## 2014-06-13 NOTE — Telephone Encounter (Addendum)
New script printed and placed in Kims' box.

## 2014-06-13 NOTE — Telephone Encounter (Signed)
Rx faxed and patient notified.

## 2014-06-13 NOTE — Addendum Note (Signed)
Addended by: Ria Bush on: 06/13/2014 01:50 PM   Modules accepted: Orders

## 2014-06-15 ENCOUNTER — Encounter: Payer: Self-pay | Admitting: Family Medicine

## 2014-06-16 DIAGNOSIS — H53453 Other localized visual field defect, bilateral: Secondary | ICD-10-CM | POA: Insufficient documentation

## 2014-06-30 ENCOUNTER — Encounter: Payer: Self-pay | Admitting: Family Medicine

## 2014-07-15 ENCOUNTER — Encounter (HOSPITAL_COMMUNITY): Payer: Self-pay

## 2014-07-15 ENCOUNTER — Emergency Department (HOSPITAL_COMMUNITY)
Admission: EM | Admit: 2014-07-15 | Discharge: 2014-07-16 | Disposition: A | Discharge status: Home / Self Care | Payer: Self-pay | Attending: Emergency Medicine | Admitting: Emergency Medicine

## 2014-07-15 ENCOUNTER — Emergency Department (HOSPITAL_COMMUNITY): Payer: BC Managed Care – PPO

## 2014-07-15 DIAGNOSIS — R109 Unspecified abdominal pain: Secondary | ICD-10-CM

## 2014-07-15 DIAGNOSIS — F419 Anxiety disorder, unspecified: Secondary | ICD-10-CM | POA: Insufficient documentation

## 2014-07-15 DIAGNOSIS — I10 Essential (primary) hypertension: Secondary | ICD-10-CM | POA: Insufficient documentation

## 2014-07-15 DIAGNOSIS — F329 Major depressive disorder, single episode, unspecified: Secondary | ICD-10-CM | POA: Insufficient documentation

## 2014-07-15 DIAGNOSIS — G43909 Migraine, unspecified, not intractable, without status migrainosus: Secondary | ICD-10-CM | POA: Insufficient documentation

## 2014-07-15 DIAGNOSIS — D72829 Elevated white blood cell count, unspecified: Secondary | ICD-10-CM

## 2014-07-15 DIAGNOSIS — Z8719 Personal history of other diseases of the digestive system: Secondary | ICD-10-CM

## 2014-07-15 DIAGNOSIS — H9042 Sensorineural hearing loss, unilateral, left ear, with unrestricted hearing on the contralateral side: Secondary | ICD-10-CM | POA: Insufficient documentation

## 2014-07-15 DIAGNOSIS — Z792 Long term (current) use of antibiotics: Secondary | ICD-10-CM | POA: Insufficient documentation

## 2014-07-15 DIAGNOSIS — Z9049 Acquired absence of other specified parts of digestive tract: Secondary | ICD-10-CM | POA: Insufficient documentation

## 2014-07-15 DIAGNOSIS — Z79899 Other long term (current) drug therapy: Secondary | ICD-10-CM | POA: Insufficient documentation

## 2014-07-15 DIAGNOSIS — H9041 Sensorineural hearing loss, unilateral, right ear, with unrestricted hearing on the contralateral side: Secondary | ICD-10-CM | POA: Insufficient documentation

## 2014-07-15 DIAGNOSIS — Z90710 Acquired absence of both cervix and uterus: Secondary | ICD-10-CM | POA: Insufficient documentation

## 2014-07-15 DIAGNOSIS — Z720 Tobacco use: Secondary | ICD-10-CM | POA: Insufficient documentation

## 2014-07-15 DIAGNOSIS — Z8619 Personal history of other infectious and parasitic diseases: Secondary | ICD-10-CM | POA: Insufficient documentation

## 2014-07-15 DIAGNOSIS — K5900 Constipation, unspecified: Secondary | ICD-10-CM

## 2014-07-15 DIAGNOSIS — K219 Gastro-esophageal reflux disease without esophagitis: Secondary | ICD-10-CM | POA: Insufficient documentation

## 2014-07-15 DIAGNOSIS — K5090 Crohn's disease, unspecified, without complications: Secondary | ICD-10-CM

## 2014-07-15 DIAGNOSIS — Z9889 Other specified postprocedural states: Secondary | ICD-10-CM | POA: Insufficient documentation

## 2014-07-15 DIAGNOSIS — Z8673 Personal history of transient ischemic attack (TIA), and cerebral infarction without residual deficits: Secondary | ICD-10-CM | POA: Insufficient documentation

## 2014-07-15 DIAGNOSIS — M1990 Unspecified osteoarthritis, unspecified site: Secondary | ICD-10-CM | POA: Insufficient documentation

## 2014-07-15 DIAGNOSIS — D649 Anemia, unspecified: Secondary | ICD-10-CM | POA: Insufficient documentation

## 2014-07-15 DIAGNOSIS — R7989 Other specified abnormal findings of blood chemistry: Secondary | ICD-10-CM

## 2014-07-15 DIAGNOSIS — R945 Abnormal results of liver function studies: Secondary | ICD-10-CM | POA: Insufficient documentation

## 2014-07-15 DIAGNOSIS — Z8541 Personal history of malignant neoplasm of cervix uteri: Secondary | ICD-10-CM | POA: Insufficient documentation

## 2014-07-15 DIAGNOSIS — D519 Vitamin B12 deficiency anemia, unspecified: Secondary | ICD-10-CM | POA: Insufficient documentation

## 2014-07-15 LAB — CBC WITH DIFFERENTIAL/PLATELET
BASOS ABS: 0.1 10*3/uL (ref 0.0–0.1)
Basophils Relative: 0 % (ref 0–1)
EOS ABS: 0.2 10*3/uL (ref 0.0–0.7)
EOS PCT: 1 % (ref 0–5)
HEMATOCRIT: 43.4 % (ref 36.0–46.0)
Hemoglobin: 14.5 g/dL (ref 12.0–15.0)
Lymphocytes Relative: 15 % (ref 12–46)
Lymphs Abs: 2.9 10*3/uL (ref 0.7–4.0)
MCH: 30.7 pg (ref 26.0–34.0)
MCHC: 33.4 g/dL (ref 30.0–36.0)
MCV: 91.8 fL (ref 78.0–100.0)
Monocytes Absolute: 0.7 10*3/uL (ref 0.1–1.0)
Monocytes Relative: 4 % (ref 3–12)
Neutro Abs: 15.1 10*3/uL — ABNORMAL HIGH (ref 1.7–7.7)
Neutrophils Relative %: 80 % — ABNORMAL HIGH (ref 43–77)
Platelets: 268 10*3/uL (ref 150–400)
RBC: 4.73 MIL/uL (ref 3.87–5.11)
RDW: 13.2 % (ref 11.5–15.5)
WBC: 19 10*3/uL — AB (ref 4.0–10.5)

## 2014-07-15 LAB — COMPREHENSIVE METABOLIC PANEL
ALT: 47 U/L — ABNORMAL HIGH (ref 0–35)
AST: 53 U/L — ABNORMAL HIGH (ref 0–37)
Albumin: 3.8 g/dL (ref 3.5–5.2)
Alkaline Phosphatase: 136 U/L — ABNORMAL HIGH (ref 39–117)
Anion gap: 16 — ABNORMAL HIGH (ref 5–15)
BUN: 12 mg/dL (ref 6–23)
CALCIUM: 9.9 mg/dL (ref 8.4–10.5)
CO2: 21 meq/L (ref 19–32)
Chloride: 102 mEq/L (ref 96–112)
Creatinine, Ser: 0.79 mg/dL (ref 0.50–1.10)
GFR calc Af Amer: >90 mL/min (ref >90)
Glucose, Bld: 111 mg/dL — ABNORMAL HIGH (ref 70–99)
Potassium: 4.6 mEq/L (ref 3.7–5.3)
Sodium: 139 mEq/L (ref 137–147)
TOTAL PROTEIN: 8.2 g/dL (ref 6.0–8.3)
Total Bilirubin: 0.3 mg/dL (ref 0.3–1.2)

## 2014-07-15 LAB — URINE MICROSCOPIC-ADD ON

## 2014-07-15 LAB — URINALYSIS, ROUTINE W REFLEX MICROSCOPIC
Bilirubin Urine: NEGATIVE
Glucose, UA: NEGATIVE mg/dL
Ketones, ur: NEGATIVE mg/dL
NITRITE: NEGATIVE
PROTEIN: NEGATIVE mg/dL
Specific Gravity, Urine: 1.017 (ref 1.005–1.030)
UROBILINOGEN UA: 0.2 mg/dL (ref 0.0–1.0)
pH: 5.5 (ref 5.0–8.0)

## 2014-07-15 MED ORDER — ONDANSETRON HCL 4 MG PO TABS: 4.0000 mg | ORAL_TABLET | Freq: Four times a day (QID) | ORAL | Status: DC

## 2014-07-15 MED ORDER — ONDANSETRON HCL 4 MG/2ML IJ SOLN
4.0000 mg | Freq: Once | INTRAMUSCULAR | Status: AC
  Administered 2014-07-15: 4 mg via INTRAVENOUS
  Filled 2014-07-15: qty 2

## 2014-07-15 MED ORDER — HYDROMORPHONE HCL 2 MG PO TABS
4.0000 mg | ORAL_TABLET | Freq: Once | ORAL | Status: DC
  Filled 2014-07-15: qty 2

## 2014-07-15 MED ORDER — HYDROMORPHONE HCL 4 MG PO TABS: 4.0000 mg | ORAL_TABLET | Freq: Four times a day (QID) | ORAL | Status: DC | PRN

## 2014-07-15 MED ORDER — HYDROMORPHONE HCL 1 MG/ML IJ SOLN
1.0000 mg | Freq: Once | INTRAMUSCULAR | Status: AC
  Administered 2014-07-15: 1 mg via INTRAVENOUS
  Filled 2014-07-15: qty 1

## 2014-07-15 MED ORDER — SODIUM CHLORIDE 0.9 % IV BOLUS (SEPSIS)
1000.0000 mL | Freq: Once | INTRAVENOUS | Status: AC
  Administered 2014-07-15: 1000 mL via INTRAVENOUS

## 2014-07-15 MED ORDER — IOHEXOL 300 MG/ML  SOLN
100.0000 mL | Freq: Once | INTRAMUSCULAR | Status: AC | PRN
  Administered 2014-07-15: 100 mL via INTRAVENOUS

## 2014-07-15 MED ORDER — ONDANSETRON 4 MG PO TBDP
4.0000 mg | ORAL_TABLET | Freq: Once | ORAL | Status: AC
  Administered 2014-07-15: 4 mg via ORAL
  Filled 2014-07-15: qty 1

## 2014-07-15 MED ORDER — PREDNISONE 20 MG PO TABS: ORAL_TABLET | ORAL | Status: DC

## 2014-07-15 NOTE — Discharge Instructions (Signed)
Crohn Disease Crohn disease is a long-term (chronic) soreness and redness (inflammation) of the intestines (bowel). It can affect any portion of the digestive tract, from the mouth to the anus. It can also cause problems outside the digestive tract. Crohn disease is closely related to a disease called ulcerative colitis (together, these two diseases are called inflammatory bowel disease).  CAUSES  The cause of Crohn disease is not known. One Link Snuffer is that, in an easily affected person, the immune system is triggered to attack the body's own digestive tissue. Crohn disease runs in families. It seems to be more common in certain geographic areas and amongst certain races. There are no clear-cut dietary causes.  SYMPTOMS  Crohn disease can cause many different symptoms since it can affect many different parts of the body. Symptoms include:  Fatigue.  Weight loss.  Chronic diarrhea, sometime bloody.  Abdominal pain and cramps.  Fever.  Ulcers or canker sores in the mouth or rectum.  Anemia (low red blood cells).  Arthritis, skin problems, and eye problems may occur. Complications of Crohn disease can include:  Series of holes (perforation) of the bowel.  Portions of the intestines sticking to each other (adhesions).  Obstruction of the bowel.  Fistula formation, typically in the rectal area but also in other areas. A fistula is an opening between the bowels and the outside, or between the bowels and another organ.  A painful crack in the mucous membrane of the anus (rectal fissure). DIAGNOSIS  Your caregiver may suspect Crohn disease based on your symptoms and an exam. Blood tests may confirm that there is a problem. You may be asked to submit a stool specimen for examination. X-rays and CT scans may be necessary. Ultimately, the diagnosis is usually made after a procedure that uses a flexible tube that is inserted via your mouth or your anus. This is done under sedation and is called  either an upper endoscopy or colonoscopy. With these tests, the specialist can take tiny tissue samples and remove them from the inside of the bowel (biopsy). Examination of this biopsy tissue under a microscope can reveal Crohn disease as the cause of your symptoms. Due to the many different forms that Crohn disease can take, symptoms may be present for several years before a diagnosis is made. TREATMENT  Medications are often used to decrease inflammation and control the immune system. These include medicines related to aspirin, steroid medications, and newer and stronger medications to slow down the immune system. Some medications may be used as suppositories or enemas. A number of other medications are used or have been studied. Your caregiver will make specific recommendations. HOME CARE INSTRUCTIONS   Symptoms such as diarrhea can be controlled with medications. Avoid foods that have a laxative effect such as fresh fruit, vegetables, and dairy products. During flare-ups, you can rest your bowel by refraining from solid foods. Drink clear liquids frequently during the day. (Electrolyte or rehydrating fluids are best. Your caregiver can help you with suggestions.) Drink often to prevent loss of body fluids (dehydration). When diarrhea has cleared, eat small meals and more frequently. Avoid food additives and stimulants such as caffeine (coffee, tea, or chocolate). Enzyme supplements may help if you develop intolerance to a sugar in dairy products (lactose). Ask your caregiver or dietitian about specific dietary instructions.  Try to maintain a positive attitude. Learn relaxation techniques such as self-hypnosis, mental imaging, and muscle relaxation.  If possible, avoid stresses which can aggravate your condition.  Exercise  regularly.  Follow your diet.  Always get plenty of rest. SEEK MEDICAL CARE IF:   Your symptoms fail to improve after a week or two of new treatment.  You experience  continued weight loss.  You have ongoing cramps or loose bowels.  You develop a new skin rash, skin sores, or eye problems. SEEK IMMEDIATE MEDICAL CARE IF:   You have worsening of your symptoms or develop new symptoms.  You have a fever.  You develop bloody diarrhea.  You develop severe abdominal pain. MAKE SURE YOU:   Understand these instructions.  Will watch your condition.  Will get help right away if you are not doing well or get worse. Document Released: 05/25/2005 Document Revised: 12/30/2013 Document Reviewed: 04/23/2007 [facility] Patient Information 2015 [place], [place]. This information is not intended to replace advice given to you by your health care provider. Make sure you discuss any questions you have with your health care provider.

## 2014-07-15 NOTE — ED Provider Notes (Signed)
CSN: [id number]     Arrival date & time 07/15/14  1609 History   First MD Initiated Contact with Patient 07/15/14 1817     Chief Complaint  Patient presents with  . Abdominal Pain     (Consider location/radiation/quality/duration/timing/severity/associated sxs/prior Treatment) Patient is a 58 y.o. female presenting with abdominal pain. The history is provided by the patient. No language interpreter was used.  Abdominal Pain Pain location:  LLQ and RLQ Pain quality: aching and cramping   Pain radiates to:  Does not radiate Pain severity:  Moderate Onset quality:  Gradual Duration:  5 days Timing:  Intermittent Progression:  Waxing and waning Chronicity:  Recurrent Relieved by:  Nothing Worsened by:  Nothing tried Ineffective treatments:  None tried Associated symptoms: constipation, diarrhea and nausea   Associated symptoms: no chest pain, no chills, no cough, no dysuria, no fatigue, no fever, no shortness of breath, no sore throat and no vomiting   Risk factors: multiple surgeries     Past Medical History  Diagnosis Date  . HTN (hypertension)   . Migraines     and frequent other headaches (sinus or stress)  . Crohn's disease 2000    h/o stenotic crohn's, active in TI s/p resection 2000, PPD neg ([facility] Dr. [name]); has been on cimzia, remicade, 6MP, entocort, now stable on Entyvio and 6MP ([facility] [place])  . Cervical cancer 12/2008    s/p hysterectomy  . History of chicken pox   . Anxiety and depression   . History of anemia     attributed to crohn's  . Arthritis   . Genital warts   . History of syphilis 1980s  . Tobacco abuse   . TIA (transient ischemic attack) 05/2010    at [facility] - TIA vs complex migraine (w/u negative - carotids, echo, TC doppler, and MRI normal)  . Hepatic steatosis 04/2011    diffuse on CT, 61m gallbladder polyp  . B12 deficiency     pernicious anemia - monthly b12 shots  . Scalp psoriasis   . GERD (gastroesophageal reflux disease)     severe, daily sxs if off PPI  . Takotsubo syndrome 07/2012    cath 08/01/12, normal coronaries, LVEF 65%  . Sensorineural hearing loss of both ears 12/2012    high freq   Past Surgical History  Procedure Laterality Date  . Appendectomy  1998  . Right colectomy  2000    crohn's disease, ileocecal resection  . Hospitalization  05/2010    TIA vs complex migraine - w/u negative - head CT, MRI/MRA, carotid dopplers, echo.  treated with ASA and tramadol  . [place]ore[place]chocardiography  05/2010    nl LV fxn ,EF 60%  . Abd u[place] 04/2011    diffuse hepatic steatosis, 526mpolyp in gallbaldder fundus, no stones, mod abd ath  . Colonoscopy  10/2008    ileo-colonic anastomosis, ielocolonic crohn's  . Colonoscopy  05/2011    ileo-colonic anastomosis normal, normal mucosa  . Esophagogastroduodenoscopy  05/2011    nl esophagus, gastritis, nl duodenum  . Cervical biopsy  w/ loop electrode excision  10/2008  . Vaginal hysterectomy  12/2008    LAVH/BSO  . Cardiac catheterization  07/2012    normal LV fxn, widely patent coronaries  . Colonoscopy  10/15/2012    focal inflammation at anastomosis, no active crohn's, planning MR enterograph (EOletta Lamas . Shoulder arthroscopy w/ rotator cuff repair Right 05/31/2012    [facility] ([facility]  Family History  Problem  Relation Age of Onset  . Crohn's disease Daughter     crohn's, ulcerative colitis  . Ulcerative colitis Daughter   . Stroke Mother   . Hypertension Mother   . Hyperlipidemia Mother   . Hypertension Father   . Crohn's disease Father   . Crohn's disease Sister   . Cancer Sister     ovarian  . Crohn's disease Paternal Aunt   . Crohn's disease Paternal Uncle   . Diabetes Neg Hx    History  Substance Use Topics  . Smoking status: Current Every Day Smoker -- 0.33 packs/day for 10 years    Types: Cigarettes  . Smokeless tobacco: Never Used  . Alcohol Use: No     Comment: remote EtOh use   OB History    Gravida Para Term Preterm AB TAB SAB  Ectopic Multiple Living   4 3   1  1   3      Review of Systems  Constitutional: Negative for fever, chills, diaphoresis, activity change, appetite change and fatigue.  HENT: Negative for congestion, facial swelling, rhinorrhea and sore throat.   Eyes: Negative for photophobia and discharge.  Respiratory: Negative for cough, chest tightness and shortness of breath.   Cardiovascular: Negative for chest pain, palpitations and leg swelling.  Gastrointestinal: Positive for nausea, abdominal pain, diarrhea and constipation. Negative for vomiting.  Endocrine: Negative for polydipsia and polyuria.  Genitourinary: Negative for dysuria, frequency, difficulty urinating and pelvic pain.  Musculoskeletal: Negative for back pain, arthralgias, neck pain and neck stiffness.  Skin: Negative for color change and wound.  Allergic/Immunologic: Negative for immunocompromised state.  Neurological: Negative for facial asymmetry, weakness, numbness and headaches.  Hematological: Does not bruise/bleed easily.  Psychiatric/Behavioral: Negative for confusion and agitation.      Allergies  Codeine; Flagyl; Hydrocodone; Ibuprofen; and Remeron  Home Medications   Prior to Admission medications   Medication Sig Start Date End Date Taking? Authorizing Provider  acetaminophen (TYLENOL) 500 MG tablet Take 1,000 mg by mouth every 6 (six) hours as needed for headache (headache).   Yes Historical Provider, MD  amitriptyline (ELAVIL) 50 MG tablet Take 1 tablet (50 mg total) by mouth at bedtime. 04/01/14  Yes Tonia Ghent, MD  citalopram (CELEXA) 40 MG tablet Take 40 mg by mouth daily.   Yes Historical Provider, MD  colestipol (COLESTID) 1 G tablet Take 1 g by mouth 2 (two) times daily.   Yes Historical Provider, MD  diazepam (VALIUM) 5 MG tablet Take 5 mg by mouth 3 (three) times daily as needed for anxiety (anxiety).    Yes Historical Provider, MD  ibuprofen (ADVIL,MOTRIN) 200 MG tablet Take 400 mg by mouth every 6  (six) hours as needed for headache (headache).   Yes Historical Provider, MD  KETOCONAZOLE EX Apply 1 application topically 3 (three) times daily as needed (yeast infection).   Yes Historical Provider, MD  mercaptopurine (PURINETHOL) 50 MG tablet Take 75 mg by mouth daily. Give on an empty stomach 1 hour before or 2 hours after meals. Caution: Chemotherapy.   Yes Historical Provider, MD  metoprolol succinate (TOPROL-XL) 50 MG 24 hr tablet Take 50 mg by mouth at bedtime. Take with or immediately following a meal.   Yes Historical Provider, MD  nystatin-triamcinolone ointment (MYCOLOG) Apply 1 application topically 2 (two) times daily. 09/06/13  Yes Ria Bush, MD  omeprazole (PRILOSEC) 40 MG capsule Take 40 mg by mouth at bedtime.    Yes Historical Provider, MD  ondansetron (ZOFRAN ODT) 4  MG disintegrating tablet 64m ODT q4 hours prn nausea/vomit 08/30/13  Yes JDossie ArbourLackey, PA-C  promethazine (PHENERGAN) 25 MG tablet Take 25 mg by mouth every 8 (eight) hours as needed for nausea or vomiting.   Yes Historical Provider, MD  vitamin B-12 (CYANOCOBALAMIN) 1000 MCG tablet Take 1,000 mcg by mouth daily.   Yes Historical Provider, MD  zolpidem (AMBIEN) 5 MG tablet Take 5 mg by mouth at bedtime.   Yes Historical Provider, MD  ciprofloxacin-dexamethasone (CIPRODEX) otic suspension Place 4 drops into both ears 2 (two) times daily. 08/30/13   JSherrie George PA-C  citalopram (CELEXA) 40 MG tablet TAKE 1 TABLET BY MOUTH DAILY 04/01/14   GTonia Ghent MD  diazepam (VALIUM) 5 MG tablet TAKE 1 TABLET BY MOUTH 3 TIMES A DAY AS NEEDED 06/13/14   JRia Bush MD  fluconazole (DIFLUCAN) 150 MG tablet Take 1 tablet (150 mg total) by mouth once a week. 08/30/13   JSherrie George PA-C  HYDROmorphone (DILAUDID) 4 MG tablet Take 1 tablet (4 mg total) by mouth every 6 (six) hours as needed for severe pain. 07/15/14   MErnestina Patches MD  metoprolol succinate (TOPROL-XL) 50 MG 24 hr tablet TAKE 1 TABLET BY MOUTH  EVERY DAY 04/01/14   GTonia Ghent MD  ondansetron (ZOFRAN) 4 MG tablet Take 1 tablet (4 mg total) by mouth every 6 (six) hours. 07/15/14   MErnestina Patches MD  predniSONE (DELTASONE) 20 MG tablet 3 tabs po day one, then 2 tabs daily x 4 days 07/15/14   MErnestina Patches MD  valACYclovir (VALTREX) 500 MG tablet Take 500 mg by mouth daily.    Historical Provider, MD  zolpidem (AMBIEN) 5 MG tablet TAKE 1 TABLET BY MOUTH AT BEDTIME 11/28/13   JRia Bush MD   BP 121/78 mmHg  Pulse 81  Temp(Src) 98.1 F (36.7 C) (Oral)  Resp 17  SpO2 92% Physical Exam  Constitutional: She is oriented to person, place, and time. She appears well-developed and well-nourished. No distress.  HENT:  Head: Normocephalic and atraumatic.  Mouth/Throat: No oropharyngeal exudate.  Eyes: Pupils are equal, round, and reactive to light.  Neck: Normal range of motion. Neck supple.  Cardiovascular: Normal rate, regular rhythm and normal heart sounds.  Exam reveals no gallop and no friction rub.   No murmur heard. Pulmonary/Chest: Effort normal and breath sounds normal. No respiratory distress. She has no wheezes. She has no rales.  Abdominal: Soft. She exhibits no distension and no mass. Bowel sounds are decreased. There is tenderness in the right lower quadrant and left lower quadrant. There is no rigidity, no rebound and no guarding.  Musculoskeletal: Normal range of motion. She exhibits no edema or tenderness.  Neurological: She is alert and oriented to person, place, and time.  Skin: Skin is warm and dry.  Psychiatric: She has a normal mood and affect.    ED Course  Procedures (including critical care time) Labs Review Labs Reviewed  CBC WITH DIFFERENTIAL - Abnormal; Notable for the following:    WBC 19.0 (*)    Neutrophils Relative % 80 (*)    Neutro Abs 15.1 (*)    All other components within normal limits  COMPREHENSIVE METABOLIC PANEL - Abnormal; Notable for the following:    Glucose, Bld 111 (*)     AST 53 (*)    ALT 47 (*)    Alkaline Phosphatase 136 (*)    Anion gap 16 (*)    All other components within  normal limits  URINALYSIS, ROUTINE W REFLEX MICROSCOPIC - Abnormal; Notable for the following:    APPearance CLOUDY (*)    Hgb urine dipstick SMALL (*)    Leukocytes, UA SMALL (*)    All other components within normal limits  URINE MICROSCOPIC-ADD ON - Abnormal; Notable for the following:    Squamous Epithelial / LPF FEW (*)    All other components within normal limits    Imaging Review Ct Abdomen Pelvis W Contrast  07/15/2014   CLINICAL DATA:  Abdominal pain starting today. Abdomen is distended but soft. History of Crohn's disease with colectomy. Poor bowel movements since Thursday.  EXAM: CT ABDOMEN AND PELVIS WITH CONTRAST  TECHNIQUE: Multidetector CT imaging of the abdomen and pelvis was performed using the standard protocol following bolus administration of intravenous contrast.  CONTRAST:  110m OMNIPAQUE IOHEXOL 300 MG/ML  SOLN  COMPARISON:  MRI abdomen 10/25/2012. CT abdomen and pelvis 07/24/2012  FINDINGS: Mild dependent changes in the lung bases.  Diffuse fatty infiltration of the liver. No focal lesions. Gallbladder, spleen, pancreas, adrenal glands, kidneys, inferior vena cava, and retroperitoneal lymph nodes are unremarkable. Calcification of aorta without aneurysm. Stomach is filled with contrast material. No wall thickening. Findings may represent ileus or enteritis. Previous partial right hemicolectomy with ileocolonic anastomosis. There is wall thickening and edema in the terminal ileum just proximal to the anastomosis, likely representing changes of Crohn's disease. Mild dilatation of fluid-filled small bowel with mild edema and infiltration in the mesentery. Edema in the right lower quadrant. No transition zone is appreciated. No free air in the abdomen. Stool-filled colon without distention or wall thickening appreciated.  Pelvis: Bladder wall is not thickened. Small  amount of free fluid in the pelvis is likely reactive. No pelvic mass or lymphadenopathy. Uterus has been resected. No destructive bone lesions.  IMPRESSION: Postoperative right partial hemicolectomy with ileocolonic anastomosis. Thickening of the terminal ileum just proximal to the anastomosis with surrounding edema and mesenteric infiltration, likely indicating changes of Crohn's disease. Diffuse dilatation of fluid-filled small bowel without obvious transition zone. Changes may be due to ileus or enteritis.   Electronically Signed   By: WLucienne CapersM.D.   On: 07/15/2014 21:33     EKG Interpretation None      MDM   Final diagnoses:  Abdominal pain  Constipation  Leukocytosis  Elevated LFTs  History of Crohn's disease  Exacerbation of Crohn's disease, without complications    Pt is a 58y.o. female with Pmhx as above who presents with 5 days of low, crampy abdominal pain.  She states that symptoms are similar to prior Crohn's exacerbations.  She reports having nausea here 4 episodes of vomiting and several episodes of diarrhea 5 days ago but has had no stool output since, other than one small hard stool.  On physical exam vital signs are stable and she is in no acute distress.  She has bilateral lower quadrant abdominal pain without rebound or guarding.  Labs show a leukocytosis, mild AST and ALTs and alkaline phosphatase elevations, urine does not appear infected. Given this new leukocytosis, a CT abdomen and pelvis was ordered versuswhich showed thickening of the terminal ileum with surrounding edema and mesenteric infiltration likely indicating current disease.  She also has dilatation and fluid filled small bowel without an obvious transition zone which is likely due to an ileus or enteritis.  Clinically this is more consistent with an ileus.  Patient was given 2 doses of IV Dilaudid with improvement of  her symptoms.  She is tolerated the contrast for the CT scan without vomiting.  We  have discussed admission versus outpatient treatment of acute Crohn's flare and close GI follow-up.  She prefers to try outpatient treatment.  She'll be discharged home with by mouth Dilaudid, which she has tolerated in the past, PO Zofran, and a 5 day course of prednisone.         Ernestina Patches, MD 07/15/14 2352

## 2014-07-15 NOTE — ED Notes (Signed)
Pt with abdominal pain starting today.  Stated she had small bm but has not had good bm since Thursday.  Abdomen distended but soft.  Has hx of chrons dz with colectomy.

## 2014-07-16 MED ORDER — HYDROMORPHONE HCL 2 MG PO TABS
2.0000 mg | ORAL_TABLET | Freq: Once | ORAL | Status: AC
  Administered 2014-07-16: 2 mg via ORAL

## 2014-08-04 ENCOUNTER — Other Ambulatory Visit: Payer: Self-pay

## 2014-08-04 NOTE — Telephone Encounter (Signed)
Pt left v/m; pt does not have ins and trying to see physician in [facility] but requesting refill citalpram,diazepam and metoprolol for one week until gets mail order med.Please advise. Pt last seen 06/06/2013.

## 2014-08-05 MED ORDER — METOPROLOL SUCCINATE ER 50 MG PO TB24: 50.0000 mg | ORAL_TABLET | Freq: Every day | ORAL | Status: DC

## 2014-08-05 MED ORDER — CITALOPRAM HYDROBROMIDE 40 MG PO TABS: ORAL_TABLET | ORAL | Status: DC

## 2014-08-05 MED ORDER — DIAZEPAM 5 MG PO TABS: ORAL_TABLET | ORAL | Status: DC

## 2014-08-05 NOTE — Telephone Encounter (Signed)
Refilled 1 month plz notify pt and phone in valium.

## 2014-08-05 NOTE — Telephone Encounter (Signed)
Rx called in as directed and message left notifying patient.

## 2014-08-07 ENCOUNTER — Encounter (HOSPITAL_COMMUNITY): Payer: Self-pay | Admitting: Cardiovascular Disease

## 2014-09-29 HISTORY — PX: COLONOSCOPY: SHX174

## 2014-10-14 DIAGNOSIS — F419 Anxiety disorder, unspecified: Secondary | ICD-10-CM | POA: Insufficient documentation

## 2014-10-28 HISTORY — PX: COLON RESECTION: SHX5231

## 2014-11-24 ENCOUNTER — Telehealth: Payer: Self-pay

## 2014-11-24 NOTE — Telephone Encounter (Signed)
Pt left v/m requesting Kim to send form to pharmaceutical co for diazepam from manufacturer. Pt last seen 09/06/2013.

## 2014-11-25 NOTE — Telephone Encounter (Signed)
Called and advised patient that I don't have a form to send to manufacturer. She will get the form/contact number and call me back.

## 2014-11-26 NOTE — Telephone Encounter (Signed)
Pt left v/m with fax # [phone] and include cover sheet for diazepam; if any further question contact pt.

## 2014-11-26 NOTE — Addendum Note (Signed)
Addended by: Royann Shivers A on: 11/26/2014 02:26 PM   Modules accepted: Orders

## 2014-11-26 NOTE — Telephone Encounter (Signed)
Ok to refill? Will need written Rx so that I can fax it. Thanks!

## 2014-11-27 MED ORDER — DIAZEPAM 5 MG PO TABS: ORAL_TABLET | ORAL | Status: DC

## 2014-11-27 NOTE — Telephone Encounter (Signed)
Rx faxed. To [facility].

## 2014-11-27 NOTE — Telephone Encounter (Signed)
Printed and in Kim's box.

## 2014-11-27 NOTE — Addendum Note (Signed)
Addended by: Ria Bush on: 11/27/2014 03:03 PM   Modules accepted: Orders

## 2014-12-21 NOTE — Consult Note (Signed)
[name] NAME:  Sharon [name], Sharon [name] MR#:  [id number] DATE OF BIRTH:  [birth year]  DATE OF CONSULTATION:  10/12/2011  REFERRING PHYSICIAN:  Wilfred Curtis, MD CONSULTING PHYSICIAN:  Rudell Cobb. Loletta Specter, MD  BRIEF HISTORY: Sharon [name] is Sharon 59 year old right-handed widowed white [name] of Dr. 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 She was admitted early 10/12/2011 and is referred for evaluation of left side weakness and right parieto-occipital lesion seen on brain CT scan. History comes from the [name] and from her hospital chart.   The [name] presented to the emergency room shortly before 6:00 p.m. on 10/11/2011 for symptoms which had started at work. In the setting of six month history of increasing frequency to daily occurrence of anterior headaches, during Sharon period of greater stress at work, she reports that at work, in late morning on 10/11/2011, she was stressed by an issue of hours or duties for herself and for Sharon coworker and experienced worsening of her headache with then development of left lobe posterior lateral neck aching discomfort and then feeling of being somewhat short of breath with some chest tightness followed by onset of some mild nausea, and then spread of pain in the left posterior head and neck, toward the left shoulder and upper arm, with feeling of numbness and some weakness of left upper extremity and left lower extremity. In the emergency room, she reports that all symptoms slowly improved with the exception of her headache which was really unchanged and included mild nausea. She got little sleep during the night. She was seen in late afternoon of 10/12/2011, and she reports that headache was decreased and that left posterolateral neck discomfort was decreased, and the other symptoms had resolved. Brain CT done without contrast had been read to show small area of  altered density of the right posterior occipital region. Brain MRI scan was performed today and was normal.   PHYSICAL EXAMINATION: The [name] is Sharon well-developed, moderately overweight white woman who was pleasant and cooperative in no apparent distress examined lying semisupine with blood pressure 130/70 and heart rate 68. There was no fever. She was normocephalic without evidence of trauma and her neck was supple with report of left posterolateral neck aching discomfort with flexion and extension, with right lateral rotation, and with bending the head to the right more than to the left. Mental status, cranial nerve examination, motor examination of the extremities, and coordination were normal. Reflexes were symmetric and rated 2+ throughout. Her gait was not tested.  IMPRESSION: Complex of symptoms including anterior headache, left posterior lateral neck discomfort, shortness of breath, chest tightness, nausea, spread of pain from head and neck to the left shoulder and upper arm on the left, and feeling of left upper and lower extremity weakness and numbness, resolved with the exception of reduction of headache to moderate intensity, symptom complex most suggestive of stress reaction with muscle spasm and mild left cervical radiculitis, in the setting of significant stress, experienced at work in Sharon [name] with history of anxiety and described greater stress level in the past six months.   RECOMMENDATIONS:  1. She is advised to optimize her sleeping position toward best relaxation of muscles in the neck and shoulder areas, including avoiding sleeping on her stomach.  2. She is given Sharon bolster cervical pillow to try with sleeping on her back or on her side.  3. She is advised to avoid use of bifocals  so as to avoid leaning her head back from reading.  4. She is advised to get up and walk about during the day when she can take Sharon break.  5. I recommended to the [name] strongly that she consider having  evaluation by psychiatry arranged through Dr. Danise Mina for evaluation and help with problems with anxiety and greater stress level at work.  6. I do not see any indication for lumbar puncture.   I appreciate being asked to see this pleasant and interesting lady.  ____________________________ Rudell Cobb. Loletta Specter, MD prc:slb D: 10/12/2011 17:41:21 ET T: 10/13/2011 09:18:46 ET JOB#: [id number]  cc: Rudell Cobb. Loletta Specter, MD, <Dictator> Linton Flemings MD ELECTRONICALLY SIGNED 10/13/2011 9:57

## 2014-12-21 NOTE — Discharge Summary (Signed)
PATIENT NAME:  Sharon [name], Sharon [name] MR#:  [id number] DATE OF BIRTH:  [birth year]  DATE OF ADMISSION:  10/12/2011 DATE OF DISCHARGE:  10/12/2011  PRESENTING COMPLAINT: Headache.   DISCHARGE DIAGNOSES:  1. Headache, likely migraine.  2. Hypertension.  3. History of herpes.  4. Crohn's disease.   CONDITION ON DISCHARGE: Fair.   MEDICATIONS: 1. Metoprolol ER 25 mg extended-release p.o. daily.  2. Lorazepam 1 mg p.o. b.i.d.  3. Omeprazole 40 mg b.i.d.  4. Mercaptopurine 50 mg 1 to 1-1/2 tablets once a day in the morning.  5. Citalopram 40 mg p.o. daily.  6. Valacyclovir 500 mg daily.  7. Aspirin 81 mg daily.  8. Ferrous sulfate 1 p.o. daily.  9. Tylenol caplet extra strength 500 mg 1 to 2 as needed.  10. Benadryl 25 mg daily as needed.  11. Cimica injection two prefilled syringes injectable once a month.  12. Ultram 50 mg p.o. t.i.d. p.r.n. Patient does have it at home.   FOLLOW UP: Follow up with [facility] in 1 to 2 weeks.   CONSULTATION: Neurology consultation with Dr. Michaela Corner.   LABORATORY, DIAGNOSTIC AND RADIOLOGICAL DATA: Labs on discharge:  MRI of the brain without contrast is normal exam.   Influenza A plus B negative.   Chest x-ray: No acute cardiopulmonary disease.   CT of the head without contrast: Questionable tiny 9 mm lesion in the right posterior parietal occipital lobe. MRI recommended. CBC: Hemoglobin 11.8, hematocrit 34.7, platelet count 266, MCV 105. Glucose 104. Basic metabolic panel is otherwise within normal limits. Chloride 108. Troponin 0.02. EKG: Normal sinus rhythm.   HOSPITAL COURSE: Sharon [name] is a 59 year old Caucasian female with past medical history of Crohn's disease comes to the Emergency Room with:  1. Acute onset headaches, generalized with left-sided subjective weakness. Patient does have history of migraine headaches which she has not had for a long time. She was admitted on the medical floor. CAT scan revealed questionable lesion on the  left parieto-occipital area that needed further clarification, hence MRI was done which was essentially negative. Patient was seen by Dr. Michaela Corner also agreed with the recommendation for p.r.n. pain medications. This could possibly with her episode of migraine. Patient was okay to go home and hence was discharged with p.r.n. Ultram. She will follow up with [facility] for follow up. 2. History of Crohn's disease. Follows with GI at [facility]. She is on Cimica and mercaptopurine.  3. History of transient ischemic attack. On aspirin.  4. Hypertension. On metoprolol.  5. Patient also has history of anxiety disorder. She was kept on p.r.n. Ativan.  6. Iron deficiency anemia. Her iron pills will be continued.  7. Hospital stay otherwise remained stable. Patient remained a FULL CODE.   TIME SPENT: 40 minutes.   ____________________________ Hart Rochester Posey Pronto, MD sap:cms D: 10/18/2011 15:40:09 ET T: 10/19/2011 09:11:57 ET JOB#: [id number]  cc: Drayke Grabel A. Posey Pronto, MD, <Dictator> [facility]  Ilda Basset MD ELECTRONICALLY SIGNED 10/23/2011 11:35

## 2014-12-21 NOTE — H&P (Signed)
PATIENT NAME:  Sharon [name], Sharon [name] MR#:  [id number] DATE OF BIRTH:  [birth year]  DATE OF ADMISSION:  10/12/2011  CHIEF COMPLAINT: Headache and left-sided weakness.   HISTORY OF PRESENT ILLNESS: Mrs. Ballester is a 59 year old Caucasian female who was in her usual state of health until yesterday when she had some headache. This headache progressed today and became worse. Additionally, she reports that today she has some left-sided weakness involving the left arm and left leg. This is only mild. Additionally, she states that she noticed that she is slightly confused in terms of unable to focus clearly and she feels that her body is tingly. The patient was evaluated in the emergency department and this involved a CAT scan of the head which showed questionable tiny 9 mm lesion in the right posterior parietal occipital lobe. The patient was admitted for further evaluation and management.   REVIEW OF SYSTEMS: CONSTITUTIONAL: The patient denies having any fever. No chills. No fatigue. No night sweats. EYES: Denies any blurring of vision. No double vision. ENT: No hearing impairment. No sore throat. No dysphagia. CARDIOVASCULAR: No chest pain. No shortness of breath. No edema. No syncope. RESPIRATORY: No cough. No sputum production. No chest pain. GASTROINTESTINAL: No nausea, no vomiting, and no abdominal pain. GENITOURINARY: No dysuria or frequency of urination. MUSCULOSKELETAL: No joint pain or swelling. No muscular pain or swelling. INTEGUMENTARY: No skin rash. No ulcers. NEUROLOGY: No focal weakness other the subjective left-sided weakness. No seizure activity. She admits having headache that got worse today. PSYCHIATRY: She has history of depression and anxiety. ENDOCRINE: No night sweats. No nocturia. No heat or cold intolerance.   PAST MEDICAL HISTORY:  1. History of systemic hypertension.  2. History of Crohn's disease.  3. History of transient ischemic attack. 4. History of anxiety. 5. History of genital  herpes.   PAST SURGICAL HISTORY: Hysterectomy.   SOCIAL HABITS: Chronic smoker since age of 34. She smokes about six cigarettes a day. No history of other drug abuse and no history of alcohol abuse.   ALLERGIES: Codeine and mirtazapine causing confusion.   SOCIAL HISTORY: The patient is divorced. She works as an Pharmacist, community.   FAMILY HISTORY: Positive for Crohn's and that involved her father, her sister, and her daughter. There is a maternal aunt who had brain cancer.   ADMISSION MEDICATIONS:  1. Valacyclovir (Valtrex) 500 mg once a day. 2. Metoprolol succinate extended-release 25 mg once a day.  3. Mercaptopurine 50 mg once a day. 4. Lorazepam 1 mg twice a day p.r.n.  5. Omeprazole 40 mg once a day.  6. Iron sulfate 325 mg once a day. 7. Citalopram 40 mg once a day.  8. Aspirin 81 mg a day.  9. She also takes an injection, Cimica. This is given once a month. This is for her Crohn's disease.   PHYSICAL EXAMINATION:   VITAL SIGNS: Blood pressure 140/72, respiratory rate 18, pulse 65, temperature 98.1, oxygen saturation 99%.   GENERAL APPEARANCE: Middle-aged female lying in bed, in no acute distress, healthy looking.   HEAD AND NECK: No pallor. No icterus. No cyanosis.  EARS, NOSE, AND THROAT:  Hearing was normal. Nasal mucosa, lips, and tongue were normal.   EYES: Normal iris and conjunctivae. Pupils are about 3 to 4 mm, equal and reactive to light.   NECK: Supple. Trachea at midline. No masses.   HEART: Normal S1 and S2. No S3 or S4. No murmur. No gallop. No carotid bruits.   LUNGS: Normal breathing pattern  without use of accessory muscles. No rales. No wheezing.   ABDOMEN: Soft without tenderness. No hepatosplenomegaly. No masses. No hernias.   SKIN: No ulcers. No subcutaneous nodules.   MUSCULOSKELETAL: No joint swelling. No clubbing.   NEUROLOGIC: The patient is alert and oriented x3. Cranial nerves II through XII are intact. No focal motor deficit by  exam. Plantar responses were zero, I mean no upgoing or downgoing.   LABS/STUDIES: Chest x-ray showed no acute cardiopulmonary abnormality.   CT scan of the head without contrast revealed questionable 8 mm, small lesion at the posterior left parietal occipital area.   Serum glucose was 104, BUN 7, creatinine 0.7, sodium 143, and potassium 3.9. Her troponin was less than 0.02. CBC showed white count of 4800, hemoglobin 11.8, hematocrit 34, and platelet count 266.   ASSESSMENT:  38. A 59 year old admitted with two days of escalating headache and mild subjective left-sided weakness. CAT scan revealed questionable lesion of the left parieto-occipital area that needs further clarification. The patient is going to need MRI with contrast in the morning. Differential diagnoses will include tumor versus herpetic lesion or herpes. 2. History of Crohn's disease.  3. History of transient ischemic attack.  4. History of systemic hypertension.  5. History of anxiety disorder. 6. Iron deficiency anemia.           PLAN: Admit the patient to the medical floor with frequent neurologic examinations. Obtain MRI of the brain with and without contrast. Neurology consultation.   TIME NEEDED TO EVALUATE PATIENT: More than 50 minutes.  ____________________________ Clovis Pu. Lenore Manner, MD amd:slb D: 10/12/2011 02:18:17 ET T: 10/12/2011 08:00:40 ET JOB#: [id number]  cc: Clovis Pu. Lenore Manner, MD, <Dictator> Mike Craze Irven Coe MD ELECTRONICALLY SIGNED 10/12/2011 22:17

## 2015-01-31 ENCOUNTER — Emergency Department (HOSPITAL_COMMUNITY): Payer: Self-pay

## 2015-01-31 ENCOUNTER — Emergency Department (HOSPITAL_COMMUNITY)
Admission: EM | Admit: 2015-01-31 | Discharge: 2015-01-31 | Disposition: A | Discharge status: Home / Self Care | Payer: Self-pay | Attending: Emergency Medicine | Admitting: Emergency Medicine

## 2015-01-31 ENCOUNTER — Encounter (HOSPITAL_COMMUNITY): Payer: Self-pay | Admitting: Emergency Medicine

## 2015-01-31 DIAGNOSIS — Z79899 Other long term (current) drug therapy: Secondary | ICD-10-CM | POA: Insufficient documentation

## 2015-01-31 DIAGNOSIS — Z720 Tobacco use: Secondary | ICD-10-CM | POA: Insufficient documentation

## 2015-01-31 DIAGNOSIS — Z8541 Personal history of malignant neoplasm of cervix uteri: Secondary | ICD-10-CM | POA: Insufficient documentation

## 2015-01-31 DIAGNOSIS — E538 Deficiency of other specified B group vitamins: Secondary | ICD-10-CM | POA: Insufficient documentation

## 2015-01-31 DIAGNOSIS — Z872 Personal history of diseases of the skin and subcutaneous tissue: Secondary | ICD-10-CM | POA: Insufficient documentation

## 2015-01-31 DIAGNOSIS — Z862 Personal history of diseases of the blood and blood-forming organs and certain disorders involving the immune mechanism: Secondary | ICD-10-CM | POA: Insufficient documentation

## 2015-01-31 DIAGNOSIS — Z8719 Personal history of other diseases of the digestive system: Secondary | ICD-10-CM | POA: Insufficient documentation

## 2015-01-31 DIAGNOSIS — R079 Chest pain, unspecified: Secondary | ICD-10-CM | POA: Insufficient documentation

## 2015-01-31 DIAGNOSIS — R1013 Epigastric pain: Secondary | ICD-10-CM | POA: Insufficient documentation

## 2015-01-31 DIAGNOSIS — Z7951 Long term (current) use of inhaled steroids: Secondary | ICD-10-CM | POA: Insufficient documentation

## 2015-01-31 DIAGNOSIS — Z9889 Other specified postprocedural states: Secondary | ICD-10-CM | POA: Insufficient documentation

## 2015-01-31 DIAGNOSIS — R109 Unspecified abdominal pain: Secondary | ICD-10-CM

## 2015-01-31 DIAGNOSIS — Z8673 Personal history of transient ischemic attack (TIA), and cerebral infarction without residual deficits: Secondary | ICD-10-CM | POA: Insufficient documentation

## 2015-01-31 DIAGNOSIS — M1990 Unspecified osteoarthritis, unspecified site: Secondary | ICD-10-CM | POA: Insufficient documentation

## 2015-01-31 DIAGNOSIS — R110 Nausea: Secondary | ICD-10-CM | POA: Insufficient documentation

## 2015-01-31 DIAGNOSIS — B379 Candidiasis, unspecified: Secondary | ICD-10-CM

## 2015-01-31 DIAGNOSIS — R630 Anorexia: Secondary | ICD-10-CM | POA: Insufficient documentation

## 2015-01-31 DIAGNOSIS — I10 Essential (primary) hypertension: Secondary | ICD-10-CM | POA: Insufficient documentation

## 2015-01-31 DIAGNOSIS — G43909 Migraine, unspecified, not intractable, without status migrainosus: Secondary | ICD-10-CM | POA: Insufficient documentation

## 2015-01-31 DIAGNOSIS — F418 Other specified anxiety disorders: Secondary | ICD-10-CM | POA: Insufficient documentation

## 2015-01-31 DIAGNOSIS — B3789 Other sites of candidiasis: Secondary | ICD-10-CM | POA: Insufficient documentation

## 2015-01-31 LAB — CBC
HEMATOCRIT: 37.6 % (ref 36.0–46.0)
Hemoglobin: 12.5 g/dL (ref 12.0–15.0)
MCH: 28.8 pg (ref 26.0–34.0)
MCHC: 33.2 g/dL (ref 30.0–36.0)
MCV: 86.6 fL (ref 78.0–100.0)
Platelets: 282 10*3/uL (ref 150–400)
RBC: 4.34 MIL/uL (ref 3.87–5.11)
RDW: 14.1 % (ref 11.5–15.5)
WBC: 8 10*3/uL (ref 4.0–10.5)

## 2015-01-31 LAB — BASIC METABOLIC PANEL
Anion gap: 8 (ref 5–15)
BUN: <5 mg/dL — ABNORMAL LOW (ref 6–20)
CO2: 25 mmol/L (ref 22–32)
Calcium: 8.8 mg/dL — ABNORMAL LOW (ref 8.9–10.3)
Chloride: 106 mmol/L (ref 101–111)
Creatinine, Ser: 0.67 mg/dL (ref 0.44–1.00)
Glucose, Bld: 89 mg/dL (ref 65–99)
Potassium: 4 mmol/L (ref 3.5–5.1)
SODIUM: 139 mmol/L (ref 135–145)

## 2015-01-31 LAB — I-STAT TROPONIN, ED
Troponin i, poc: 0 ng/mL (ref 0.00–0.08)
Troponin i, poc: 0 ng/mL (ref 0.00–0.08)

## 2015-01-31 LAB — HEPATIC FUNCTION PANEL
ALT: 32 U/L (ref 14–54)
AST: 42 U/L — ABNORMAL HIGH (ref 15–41)
Albumin: 3.4 g/dL — ABNORMAL LOW (ref 3.5–5.0)
Alkaline Phosphatase: 134 U/L — ABNORMAL HIGH (ref 38–126)
BILIRUBIN TOTAL: 0.2 mg/dL — AB (ref 0.3–1.2)
Bilirubin, Direct: <0.1 mg/dL — ABNORMAL LOW (ref 0.1–0.5)
Total Protein: 6.9 g/dL (ref 6.5–8.1)

## 2015-01-31 LAB — LIPASE, BLOOD: LIPASE: 18 U/L — AB (ref 22–51)

## 2015-01-31 LAB — I-STAT CG4 LACTIC ACID, ED: LACTIC ACID, VENOUS: 1.32 mmol/L (ref 0.5–2.0)

## 2015-01-31 MED ORDER — ONDANSETRON HCL 4 MG/2ML IJ SOLN
4.0000 mg | Freq: Once | INTRAMUSCULAR | Status: AC
  Administered 2015-01-31: 4 mg via INTRAVENOUS
  Filled 2015-01-31: qty 2

## 2015-01-31 MED ORDER — ASPIRIN 325 MG PO TABS: 325.0000 mg | ORAL_TABLET | Freq: Once | ORAL | Status: DC

## 2015-01-31 MED ORDER — IOHEXOL 300 MG/ML  SOLN
100.0000 mL | Freq: Once | INTRAMUSCULAR | Status: AC | PRN
  Administered 2015-01-31: 100 mL via INTRAVENOUS

## 2015-01-31 NOTE — ED Provider Notes (Signed)
CSN: [id number]     Arrival date & time 01/31/15  1525 History   First MD Initiated Contact with Patient 01/31/15 1540     Chief Complaint  Patient presents with  . Chest Pain     (Consider location/radiation/quality/duration/timing/severity/associated sxs/prior Treatment) Patient is a 59 y.o. female presenting with chest pain.  Chest Pain Pain location:  Substernal area Pain quality: pressure and tightness   Pain radiates to:  L arm Pain radiates to the back: no   Pain severity:  Moderate Onset quality:  Gradual Duration:  1 day Timing:  Constant Progression:  Unchanged Context comment:  After dinner yesterday, HTN to 200/120 at fire department Relieved by:  Nothing Associated symptoms: anorexia and nausea   Associated symptoms: no fever and not vomiting     Past Medical History  Diagnosis Date  . HTN (hypertension)   . Migraines     and frequent other headaches (sinus or stress)  . Crohn's disease 2000    h/o stenotic crohn's, active in TI s/p resection 2000, PPD neg ([facility] Dr. [name]); has been on cimzia, remicade, 6MP, entocort, now stable on Entyvio and 6MP ([facility])  . Cervical cancer 12/2008    s/p hysterectomy  . History of chicken pox   . Anxiety and depression   . History of anemia     attributed to crohn's  . Arthritis   . Genital warts   . History of syphilis 1980s  . Tobacco abuse   . TIA (transient ischemic attack) 05/2010    at [facility] - TIA vs complex migraine (w/u negative - carotids, echo, TC doppler, and MRI normal)  . Hepatic steatosis 04/2011    diffuse on CT, 13m gallbladder polyp  . B12 deficiency     pernicious anemia - monthly b12 shots  . Scalp psoriasis   . GERD (gastroesophageal reflux disease)     severe, daily sxs if off PPI  . Takotsubo syndrome 07/2012    cath 08/01/12, normal coronaries, LVEF 65%  . Sensorineural hearing loss of both ears 12/2012    high freq   Past Surgical History  Procedure Laterality Date  .  Appendectomy  1998  . Right colectomy  2000    crohn's disease, ileocecal resection  . Hospitalization  05/2010    TIA vs complex migraine - w/u negative - head CT, MRI/MRA, carotid dopplers, echo.  treated with ASA and tramadol  . [place]ocardiography  05/2010    nl LV fxn ,EF 60%  . [place] 04/2011    diffuse hepatic steatosis, 543mpolyp in gallbaldder fundus, no stones, mod abd ath  . Colonoscopy  10/2008    ileo-colonic anastomosis, ielocolonic crohn's  . Colonoscopy  05/2011    ileo-colonic anastomosis normal, normal mucosa  . Esophagogastroduodenoscopy  05/2011    nl esophagus, gastritis, nl duodenum  . Cervical biopsy  w/ loop electrode excision  10/2008  . Vaginal hysterectomy  12/2008    LAVH/BSO  . Cardiac catheterization  07/2012    normal LV fxn, widely patent coronaries  . Colonoscopy  10/15/2012    focal inflammation at anastomosis, no active crohn's, planning MR enterograph (EOletta Lamas . Shoulder arthroscopy w/ rotator cuff repair Right 05/31/2012    [facility] ([facility] . Left heart catheterization with coronary angiogram N/A 08/01/2012    Procedure: LEFT HEART CATHETERIZATION WITH CORONARY ANGIOGRAM;  Surgeon: MiSherren MochaMD;  [facility];  Service: Cardiovascular;  Laterality: N/A;  . Bowel  resection     Family History  Problem Relation Age of Onset  . Crohn's disease Daughter     crohn's, ulcerative colitis  . Ulcerative colitis Daughter   . Stroke Mother   . Hypertension Mother   . Hyperlipidemia Mother   . Hypertension Father   . Crohn's disease Father   . Crohn's disease Sister   . Cancer Sister     ovarian  . Crohn's disease Paternal Aunt   . Crohn's disease Paternal Uncle   . Diabetes Neg Hx    History  Substance Use Topics  . Smoking status: Current Every Day Smoker -- 0.33 packs/day for 10 years    Types: Cigarettes  . Smokeless tobacco: Never Used  . Alcohol Use: No     Comment: remote EtOh use   OB History    Gravida Para  Term Preterm AB TAB SAB Ectopic Multiple Living   4 3   1  1   3      Review of Systems  Constitutional: Negative for fever.  Cardiovascular: Positive for chest pain.  Gastrointestinal: Positive for nausea and anorexia. Negative for vomiting.  All other systems reviewed and are negative.     Allergies  Codeine; Flagyl; Hydrocodone; Ibuprofen; and Remeron  Home Medications   Prior to Admission medications   Medication Sig Start Date End Date Taking? Authorizing Provider  acetaminophen (TYLENOL) 500 MG tablet Take 1,000 mg by mouth every 6 (six) hours as needed for headache (headache).   Yes Historical Provider, MD  amitriptyline (ELAVIL) 50 MG tablet Take 1 tablet (50 mg total) by mouth at bedtime. 04/01/14  Yes Tonia Ghent, MD  busPIRone (BUSPAR) 10 MG tablet Take 5 mg by mouth 2 (two) times daily.   Yes Historical Provider, MD  citalopram (CELEXA) 40 MG tablet TAKE 1 TABLET BY MOUTH DAILY 08/05/14  Yes Ria Bush, MD  HYDROmorphone (DILAUDID) 4 MG tablet Take 1 tablet (4 mg total) by mouth every 6 (six) hours as needed for severe pain. 07/15/14  Yes Ernestina Patches, MD  hydroxypropyl methylcellulose / hypromellose (ISOPTO TEARS / GONIOVISC) 2.5 % ophthalmic solution Place 1 drop into both eyes daily as needed for dry eyes.   Yes Historical Provider, MD  ibuprofen (ADVIL,MOTRIN) 200 MG tablet Take 400 mg by mouth every 6 (six) hours as needed for headache (headache).   Yes Historical Provider, MD  KETOCONAZOLE EX Apply 1 application topically 3 (three) times daily as needed (yeast infection).   Yes Historical Provider, MD  metoprolol succinate (TOPROL-XL) 50 MG 24 hr tablet Take 1 tablet (50 mg total) by mouth at bedtime. Take with or immediately following a meal. 08/05/14  Yes Ria Bush, MD  nystatin-triamcinolone ointment [facility]) Apply 1 application topically 2 (two) times daily. 09/06/13  Yes Ria Bush, MD  ondansetron (ZOFRAN) 4 MG tablet Take 1 tablet (4 mg total)  by mouth every 6 (six) hours. 07/15/14  Yes Ernestina Patches, MD  PRESCRIPTION MEDICATION Augusto Gamble. Every 8 weeks. Patient not sure what dose of medication.Marland Kitchen Next dose is due on July 1st 2016   Yes Historical Provider, MD  vitamin B-12 (CYANOCOBALAMIN) 1000 MCG tablet Take 1,000 mcg by mouth daily.   Yes Historical Provider, MD  zolpidem (AMBIEN) 5 MG tablet TAKE 1 TABLET BY MOUTH AT BEDTIME 11/28/13  Yes Ria Bush, MD  ciprofloxacin-dexamethasone [facility]) otic suspension Place 4 drops into both ears 2 (two) times daily. Patient not taking: Reported on 07/17/2014 08/30/13   Maude Leriche, PA-C  diazepam (  VALIUM) 5 MG tablet TAKE 1 TABLET BY MOUTH 3 TIMES A DAY AS NEEDED Patient not taking: Reported on 01/31/2015 11/27/14   Ria Bush, MD  fluconazole (DIFLUCAN) 150 MG tablet Take 1 tablet (150 mg total) by mouth once a week. Patient not taking: Reported on 07/17/2014 08/30/13   Maude Leriche, PA-C  ondansetron (ZOFRAN ODT) 4 MG disintegrating tablet 3m ODT q4 hours prn nausea/vomit Patient not taking: Reported on 01/31/2015 08/30/13   JMaude Leriche PA-C  predniSONE (DELTASONE) 20 MG tablet 3 tabs po day one, then 2 tabs daily x 4 days Patient not taking: Reported on 01/31/2015 07/15/14   MErnestina Patches MD   BP 150/85 mmHg  Pulse 74  Temp(Src) 98.3 F (36.8 C) (Oral)  Resp 19  SpO2 99% Physical Exam  Constitutional: She is oriented to person, place, and time. She appears well-developed and well-nourished.  HENT:  Head: Normocephalic and atraumatic.  Right Ear: External ear normal.  Left Ear: External ear normal.  Eyes: Conjunctivae and EOM are normal. Pupils are equal, round, and reactive to light.  Neck: Normal range of motion. Neck supple.  Cardiovascular: Normal rate, regular rhythm, normal heart sounds and intact distal pulses.   Pulmonary/Chest: Effort normal and breath sounds normal.  Abdominal: Soft. Bowel sounds are normal. There is tenderness (epigastric).  Incision with 1cm open  area with purulence with scaling erythema around umbilicus  Musculoskeletal: Normal range of motion.  Neurological: She is alert and oriented to person, place, and time.  Skin: Skin is warm and dry.  Vitals reviewed.   ED Course  Procedures (including critical care time) Labs Review Labs Reviewed  BASIC METABOLIC PANEL - Abnormal; Notable for the following:    BUN <5 (*)    Calcium 8.8 (*)    All other components within normal limits  LIPASE, BLOOD - Abnormal; Notable for the following:    Lipase 18 (*)    All other components within normal limits  HEPATIC FUNCTION PANEL - Abnormal; Notable for the following:    Albumin 3.4 (*)    AST 42 (*)    Alkaline Phosphatase 134 (*)    Total Bilirubin 0.2 (*)    Bilirubin, Direct <0.1 (*)    All other components within normal limits  CBC  I-STAT TROPOININ, ED  I-STAT CG4 LACTIC ACID, ED  I-STAT TROPOININ, ED  IR[facility]ol[facility] ED    Imaging Review Dg Chest 2 View  01/31/2015   CLINICAL DATA:  Acute onset of mid to left-sided chest pain. Left-sided facial droop. Initial encounter.  EXAM: CHEST  2 VIEW  COMPARISON:  Chest radiograph performed 07/31/2012  FINDINGS: The lungs are well-aerated and clear. There is no evidence of focal opacification, pleural effusion or pneumothorax.  The heart is normal in size; the mediastinal contour is within normal limits. No acute osseous abnormalities are seen.  IMPRESSION: No acute cardiopulmonary process seen.   Electronically Signed   By: JGarald BaldingM.D.   On: 01/31/2015 17:15   Ct Abdomen Pelvis W Contrast  01/31/2015   CLINICAL DATA:  Generalized abdominal pain for past 3 months. Nausea. Previous bowel resection for Crohn's disease. Chronic constipation.  EXAM: CT ABDOMEN AND PELVIS WITH CONTRAST  TECHNIQUE: Multidetector CT imaging of the abdomen and pelvis was performed using the standard protocol following bolus administration of intravenous contrast.  CONTRAST:  10108mOMNIPAQUE IOHEXOL 300 MG/ML   SOLN  COMPARISON:  07/15/2014  FINDINGS: Lower Chest:  Unremarkable.  Hepatobiliary: Diffuse hepatic steatosis again noted.  No liver masses are identified. Gallbladder is collapsed.  Pancreas: No mass, inflammatory changes, or other significant abnormality identified.  Spleen:  Within normal limits in size and appearance.  Adrenals:  No masses identified.  Kidneys/Urinary Tract:  No evidence of masses or hydronephrosis.  Stomach/Bowel/Peritoneum: Previously seen dilated small bowel loops and wall thickening involving terminal ileum have resolved since previous study. There is persistent mild wall thickening involving the right colon, suspicious for mild colitis. No evidence of abscess or free fluid.  Vascular/Lymphatic: No pathologically enlarged lymph nodes identified. No other significant abnormality visualized.  Reproductive: Prior hysterectomy noted. Adnexal regions are unremarkable in appearance.  Other:  None.  Musculoskeletal:  No suspicious bone lesions identified.  IMPRESSION: Resolution of small bowel dilatation and wall thickening involving terminal ileum. Mild persistent wall thickening involving the right colon, suspicious for mild Crohn's colitis which has improved since previous study.  No evidence of abscess or other complication.  Stable diffuse hepatic steatosis.   Electronically Signed   By: Earle Gell M.D.   On: 01/31/2015 19:23     EKG Interpretation   Date/Time:  Saturday January 31 2015 15:29:59 EDT Ventricular Rate:  79 PR Interval:  137 QRS Duration: 82 QT Interval:  399 QTC Calculation: 457 R Axis:   44 Text Interpretation:  Sinus rhythm No significant change since last  tracing Confirmed by Debby Freiberg [phone]) on 01/31/2015 3:45:15 PM      MDM   Final diagnoses:  Abdominal pain  Chest pain, unspecified chest pain type  Candida albicans infection    59 y.o. female with pertinent PMH of crohns, anxiety presents with chest tightness as above beginning last night.   History atypical for ACS, nonexertional, and no exacerbating or alleviating factors.  She had a small bowel resection 4 months ago, had a post operative wound infection, had done wet to dry dressings, and has had persistent redness with visits for post op wound checks with same symptoms today.  She does state that her abd hurts worse than normally right now.  Appearance of wound chronic appearing and fungal.    Elba Barman as above unremarkable.  No fever here.  Pt well appearing.  Delta trop unremarkable, and symptoms not likely to represent ACS.  Doubt PE given lack of dyspnea, tachycardia, or other symptoms.  Pt will fu with surgery for her wound.  DC home in stable condition  I have reviewed all laboratory and imaging studies if ordered as above  1. Chest pain, unspecified chest pain type   2. Abdominal pain   3. Candida albicans infection         Debby Freiberg, MD 02/01/15 [phone]

## 2015-01-31 NOTE — ED Notes (Signed)
Pt from home via [facility] with c/o "not feeling well yesterday" that kept the pt awake all night.  Pt reports feeling palpitations over night, then chest tightness 4/10 starting between 3-4 am with nausea and a headache.  [place] [facility] [place] where her BP was 200/120.  Pt given 3 nitro, no change in pain, BP decreased to 149/95.  Pt in NAD, A&O, reports anxiety.

## 2015-01-31 NOTE — ED Notes (Signed)
Reddened abdominal wound with yellow drainage noted.  Pt reports bowel resection in Feb.

## 2015-01-31 NOTE — Discharge Instructions (Signed)
Chest Pain (Nonspecific) It is often hard to give a specific diagnosis for the cause of chest pain. There is always a chance that your pain could be related to something serious, such as a heart attack or a blood clot in the lungs. You need to follow up with your health care provider for further evaluation. CAUSES   Heartburn.  Pneumonia or bronchitis.  Anxiety or stress.  Inflammation around your heart (pericarditis) or lung (pleuritis or pleurisy).  A blood clot in the lung.  A collapsed lung (pneumothorax). It can develop suddenly on its own (spontaneous pneumothorax) or from trauma to the chest.  Shingles infection (herpes zoster virus). The chest wall is composed of bones, muscles, and cartilage. Any of these can be the source of the pain.  The bones can be bruised by injury.  The muscles or cartilage can be strained by coughing or overwork.  The cartilage can be affected by inflammation and become sore (costochondritis). DIAGNOSIS  Lab tests or other studies may be needed to find the cause of your pain. Your health care provider may have you take a test called an ambulatory electrocardiogram (ECG). An ECG records your heartbeat patterns over a 24-hour period. You may also have other tests, such as:  Transthoracic echocardiogram (TTE). During echocardiography, sound waves are used to evaluate how blood flows through your heart.  Transesophageal echocardiogram (TEE).  Cardiac monitoring. This allows your health care provider to monitor your heart rate and rhythm in real time.  Holter monitor. This is a portable device that records your heartbeat and can help diagnose heart arrhythmias. It allows your health care provider to track your heart activity for several days, if needed.  Stress tests by exercise or by giving medicine that makes the heart beat faster. TREATMENT   Treatment depends on what may be causing your chest pain. Treatment may include:  Acid blockers for  heartburn.  Anti-inflammatory medicine.  Pain medicine for inflammatory conditions.  Antibiotics if an infection is present.  You may be advised to change lifestyle habits. This includes stopping smoking and avoiding alcohol, caffeine, and chocolate.  You may be advised to keep your head raised (elevated) when sleeping. This reduces the chance of acid going backward from your stomach into your esophagus. Most of the time, nonspecific chest pain will improve within 2-3 days with rest and mild pain medicine.  HOME CARE INSTRUCTIONS   If antibiotics were prescribed, take them as directed. Finish them even if you start to feel better.  For the next few days, avoid physical activities that bring on chest pain. Continue physical activities as directed.  Do not use any tobacco products, including cigarettes, chewing tobacco, or electronic cigarettes.  Avoid drinking alcohol.  Only take medicine as directed by your health care provider.  Follow your health care provider's suggestions for further testing if your chest pain does not go away.  Keep any follow-up appointments you made. If you do not go to an appointment, you could develop lasting (chronic) problems with pain. If there is any problem keeping an appointment, call to reschedule. SEEK MEDICAL CARE IF:   Your chest pain does not go away, even after treatment.  You have a rash with blisters on your chest.  You have a fever. SEEK IMMEDIATE MEDICAL CARE IF:   You have increased chest pain or pain that spreads to your arm, neck, jaw, back, or abdomen.  You have shortness of breath.  You have an increasing cough, or you cough  up blood.  You have severe back or abdominal pain.  You feel nauseous or vomit.  You have severe weakness.  You faint.  You have chills. This is an emergency. Do not wait to see if the pain will go away. Get medical help at once. Call your local emergency services (911 in [place] Do not drive  yourself to the hospital. MAKE SURE YOU:   Understand these instructions.  Will watch your condition.  Will get help right away if you are not doing well or get worse. Document Released: 05/25/2005 Document Revised: 08/20/2013 Document Reviewed: 03/20/2008 [facility] Patient Information 2015 [place], [place]. This information is not intended to replace advice given to you by your health care provider. Make sure you discuss any questions you have with your health care provider. Candida Infection A Candida infection (also called yeast, fungus, and Monilia infection) is an overgrowth of yeast that can occur anywhere on the body. A yeast infection commonly occurs in warm, moist body areas. Usually, the infection remains localized but can spread to become a systemic infection. A yeast infection may be a sign of a more severe disease such as diabetes, leukemia, or AIDS. A yeast infection can occur in both men and women. In women, Candida vaginitis is a vaginal infection. It is one of the most common causes of vaginitis. Men usually do not have symptoms or know they have an infection until other problems develop. Men may find out they have a yeast infection because their sex partner has a yeast infection. Uncircumcised men are more likely to get a yeast infection than circumcised men. This is because the uncircumcised glans is not exposed to air and does not remain as dry as that of a circumcised glans. Older adults may develop yeast infections around dentures. CAUSES  Women  Antibiotics.  Steroid medication taken for a long time.  Being overweight (obese).  Diabetes.  Poor immune condition.  Certain serious medical conditions.  Immune suppressive medications for organ transplant patients.  Chemotherapy.  Pregnancy.  Menstruation.  Stress and fatigue.  Intravenous drug use.  Oral contraceptives.  Wearing tight-fitting clothes in the crotch area.  Catching it from a sex partner who  has a yeast infection.  Spermicide.  Intravenous, urinary, or other catheters. Men  Catching it from a sex partner who has a yeast infection.  Having oral or anal sex with a person who has the infection.  Spermicide.  Diabetes.  Antibiotics.  Poor immune system.  Medications that suppress the immune system.  Intravenous drug use.  Intravenous, urinary, or other catheters. SYMPTOMS  Women  Thick, white vaginal discharge.  Vaginal itching.  Redness and swelling in and around the vagina.  Irritation of the lips of the vagina and perineum.  Blisters on the vaginal lips and perineum.  Painful sexual intercourse.  Low blood sugar (hypoglycemia).  Painful urination.  Bladder infections.  Intestinal problems such as constipation, indigestion, bad breath, bloating, increase in gas, diarrhea, or loose stools. Men  Men may develop intestinal problems such as constipation, indigestion, bad breath, bloating, increase in gas, diarrhea, or loose stools.  Dry, cracked skin on the penis with itching or discomfort.  Jock itch.  Dry, flaky skin.  Athlete's foot.  Hypoglycemia. DIAGNOSIS  Women  A history and an exam are performed.  The discharge may be examined under a microscope.  A culture may be taken of the discharge. Men  A history and an exam are performed.  Any discharge from the penis or  areas of cracked skin will be looked at under the microscope and cultured.  Stool samples may be cultured. TREATMENT  Women  Vaginal antifungal suppositories and creams.  Medicated creams to decrease irritation and itching on the outside of the vagina.  Warm compresses to the perineal area to decrease swelling and discomfort.  Oral antifungal medications.  Medicated vaginal suppositories or cream for repeated or recurrent infections.  Wash and dry the irritation areas before applying the cream.  Eating yogurt with Lactobacillus may help with prevention and  treatment.  Sometimes painting the vagina with gentian violet solution may help if creams and suppositories do not work. Men  Antifungal creams and oral antifungal medications.  Sometimes treatment must continue for 30 days after the symptoms go away to prevent recurrence. HOME CARE INSTRUCTIONS  Women  Use cotton underwear and avoid tight-fitting clothing.  Avoid colored, scented toilet paper and deodorant tampons or pads.  Do not douche.  Keep your diabetes under control.  Finish all the prescribed medications.  Keep your skin clean and dry.  Consume milk or yogurt with Lactobacillus-active culture regularly. If you get frequent yeast infections and think that is what the infection is, there are over-the-counter medications that you can get. If the infection does not show healing in 3 days, talk to your caregiver.  Tell your sex partner you have a yeast infection. Your partner may need treatment also, especially if your infection does not clear up or recurs. Men  Keep your skin clean and dry.  Keep your diabetes under control.  Finish all prescribed medications.  Tell your sex partner that you have a yeast infection so he or she can be treated if necessary. SEEK MEDICAL CARE IF:   Your symptoms do not clear up or worsen in one week after treatment.  You have an oral temperature above 102 F (38.9 C).  You have trouble swallowing or eating for a prolonged time.  You develop blisters on and around your vagina.  You develop vaginal bleeding and it is not your menstrual period.  You develop abdominal pain.  You develop intestinal problems as mentioned above.  You get weak or light-headed.  You have painful or increased urination.  You have pain during sexual intercourse. MAKE SURE YOU:   Understand these instructions.  Will watch your condition.  Will get help right away if you are not doing well or get worse. Document Released: 09/22/2004 Document  Revised: 12/30/2013 Document Reviewed: 01/04/2010 [facility] Patient Information 2015 [place], [place]. This information is not intended to replace advice given to you by your health care provider. Make sure you discuss any questions you have with your health care provider.

## 2015-05-01 ENCOUNTER — Other Ambulatory Visit: Payer: Self-pay | Admitting: *Deleted

## 2015-05-01 ENCOUNTER — Ambulatory Visit (INDEPENDENT_AMBULATORY_CARE_PROVIDER_SITE_OTHER): Payer: Self-pay | Admitting: Family Medicine

## 2015-05-01 ENCOUNTER — Encounter: Payer: Self-pay | Admitting: *Deleted

## 2015-05-01 ENCOUNTER — Encounter: Payer: Self-pay | Admitting: Family Medicine

## 2015-05-01 VITALS — BP 144/80 | HR 88 | Temp 98.5°F | Wt 150.8 lb

## 2015-05-01 DIAGNOSIS — F418 Other specified anxiety disorders: Secondary | ICD-10-CM

## 2015-05-01 DIAGNOSIS — F32A Depression, unspecified: Secondary | ICD-10-CM

## 2015-05-01 DIAGNOSIS — K50919 Crohn's disease, unspecified, with unspecified complications: Secondary | ICD-10-CM

## 2015-05-01 DIAGNOSIS — I10 Essential (primary) hypertension: Secondary | ICD-10-CM

## 2015-05-01 DIAGNOSIS — F329 Major depressive disorder, single episode, unspecified: Secondary | ICD-10-CM

## 2015-05-01 DIAGNOSIS — E538 Deficiency of other specified B group vitamins: Secondary | ICD-10-CM

## 2015-05-01 DIAGNOSIS — F419 Anxiety disorder, unspecified: Secondary | ICD-10-CM

## 2015-05-01 MED ORDER — AMITRIPTYLINE HCL 50 MG PO TABS: 50.0000 mg | ORAL_TABLET | Freq: Every day | ORAL | Status: DC

## 2015-05-01 MED ORDER — CLONAZEPAM 0.5 MG PO TABS: 0.5000 mg | ORAL_TABLET | Freq: Two times a day (BID) | ORAL | Status: DC | PRN

## 2015-05-01 MED ORDER — METOPROLOL TARTRATE 50 MG PO TABS: 50.0000 mg | ORAL_TABLET | Freq: Two times a day (BID) | ORAL | Status: DC

## 2015-05-01 MED ORDER — CITALOPRAM HYDROBROMIDE 40 MG PO TABS: ORAL_TABLET | ORAL | Status: DC

## 2015-05-01 NOTE — Patient Instructions (Addendum)
Controlled substance agreement today. Stop buspar Start clonazepam 0.60m once to twice daily. After 2-3 weeks call me with update. Increase metoprolol to 575mtwice daily - new medicine will be at pharmacy. Good to see you today, call [place] questions. Return as needed or in 3 months for follow up visit.

## 2015-05-01 NOTE — Assessment & Plan Note (Signed)
Deteriorated. Buspar not effective - and previously ativan lost effectiveness (developing tolerance). Valium not tolerated well. Will start long acting benzo - klonopin. Discussed risks of controlled substances including tolerance/dependence on medication, habit forming nature of med, addition/abuse potential. Will fill out controlled substance agreement today and UDS today. RTC 3 mo f/u visit, call me in 2-3 wks for update.  Will discuss counseling next visit (previously effective but expensive).

## 2015-05-01 NOTE — Progress Notes (Signed)
Pre visit review using our clinic review tool, if applicable. No additional management support is needed unless otherwise documented below in the visit note. 

## 2015-05-01 NOTE — Progress Notes (Signed)
BP 144/80 mmHg  Pulse 88  Temp(Src) 98.5 F (36.9 C) (Oral)  Wt 150 lb 12 oz (68.38 kg)  SpO2 98%   CC: med refill visit  Subjective:    Patient ID: Sharon [name], female    DOB: 1956-05-19, 59 y.o.   MRN: [id number]  HPI: Sharon [name] is a 59 y.o. female presenting on 05/01/2015 for Follow-up   Not seen since 08/2013.  No insurance. Has been seeing [facility] MDs in interim finances). 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 Decided to return to see me - more comfortable here. 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   Anxiety - on amitriptyline 60m nightly, celexa 431mdaily, buspar (this has not been effective). Noticing worsening trouble with concentration, trouble sleeping at night time (trouble shutting off mind). Has been on ambien nightly PRN, no recent use. Valium caused hyperactivity and increased activity/energy in a bad way. Prior lorazepam effective but noted tolerance developing.   Denies depression. No SI/HI.   HTN - Compliant with current antihypertensive regimen of Toprol XL 5055maily. Does not check blood pressures at home - planning on buying cuff. No low blood pressure readings or symptoms of dizziness/syncope. Denies vision changes, SOB, leg swelling. Endorses some headaches and chest discomfort.   Crohn's - very well controlled on Entyvio. [facility] (Dr BloKoleen Distance  Relevant past medical, surgical, family and social history reviewed and updated as indicated. Interim medical history since our last visit reviewed. Allergies and medications reviewed and updated. Current Outpatient Prescriptions on File Prior to Visit  Medication Sig  . acetaminophen (TYLENOL) 500 MG tablet Take 1,000 mg by mouth every 6 (six) hours as needed for headache (headache).  . aMarland Kitchenitriptyline (ELAVIL) 50 MG tablet Take 1 tablet (50 mg total) by mouth at bedtime.  . citalopram (CELEXA) 40 MG tablet TAKE 1 TABLET BY MOUTH DAILY  .  ibuprofen (ADVIL,MOTRIN) 200 MG tablet Take 400 mg by mouth every 6 (six) hours as needed for headache (headache).  . metoprolol succinate (TOPROL-XL) 50 MG 24 hr tablet Take 1 tablet (50 mg total) by mouth at bedtime. Take with or immediately following a meal.  . vitamin B-12 (CYANOCOBALAMIN) 1000 MCG tablet Take 1,000 mcg by mouth daily.  . zMarland Kitchenlpidem (AMBIEN) 5 MG tablet TAKE 1 TABLET BY MOUTH AT BEDTIME (Patient not taking: Reported on 05/01/2015)   No current facility-administered medications on file prior to visit.    Review of Systems Per HPI unless specifically indicated above     Objective:    BP 144/80 mmHg  Pulse 88  Temp(Src) 98.5 F (36.9 C) (Oral)  Wt 150 lb 12 oz (68.38 kg)  SpO2 98%  Wt Readings from Last 3 Encounters:  05/01/15 150 lb 12 oz (68.38 kg)  09/06/13 166 lb 8 oz (75.524 kg)  08/30/13 169 lb (76.658 kg)    Physical Exam  Constitutional: She appears well-developed and well-nourished. No distress.  HENT:  Mouth/Throat: Oropharynx is clear and moist. No oropharyngeal exudate.  Eyes: Conjunctivae and EOM are normal. Pupils are equal, round, and reactive to light.  Cardiovascular: Normal rate, regular rhythm, normal heart sounds and intact distal pulses.   No murmur heard. Pulmonary/Chest: Effort normal and breath sounds normal. No respiratory distress. She has no wheezes. She has no rales.  Musculoskeletal: She exhibits no edema.  Psychiatric: She has a normal mood and affect.  Nursing note and vitals reviewed.  Assessment & Plan:   Problem List Items Addressed This Visit    HTN (hypertension)    Chronic, elevated today. Increase metoprolol to 14m BID (was taking toprol xl 547mdaily). Pt will buy BP cuff and monitor at home. Update with effect.      Relevant Medications   metoprolol (LOPRESSOR) 50 MG tablet   Crohn's disease    Now on Entyvio and doing well. Followed by [facility].       Anxiety and depression - Primary    Deteriorated.  Buspar not effective - and previously ativan lost effectiveness (developing tolerance). Valium not tolerated well. Will start long acting benzo - klonopin. Discussed risks of controlled substances including tolerance/dependence on medication, habit forming nature of med, addition/abuse potential. Will fill out controlled substance agreement today and UDS today. RTC 3 mo f/u visit, call me in 2-3 wks for update.  Will discuss counseling next visit (previously effective but expensive).      Vitamin B12 deficiency    rec restart B12 tablet.          Follow up plan: Return in about 3 months (around 07/31/2015), or as needed, for follow up visit.

## 2015-05-01 NOTE — Assessment & Plan Note (Signed)
Chronic, elevated today. Increase metoprolol to 16m BID (was taking toprol xl 535mdaily). Pt will buy BP cuff and monitor at home. Update with effect.

## 2015-05-01 NOTE — Telephone Encounter (Signed)
Ok to refill 

## 2015-05-01 NOTE — Assessment & Plan Note (Signed)
rec restart B12 tablet.

## 2015-05-01 NOTE — Assessment & Plan Note (Signed)
Now on Entyvio and doing well. Followed by [facility].

## 2015-05-26 ENCOUNTER — Encounter (HOSPITAL_COMMUNITY): Payer: Self-pay | Admitting: Emergency Medicine

## 2015-05-26 ENCOUNTER — Emergency Department (HOSPITAL_COMMUNITY): Payer: Self-pay

## 2015-05-26 ENCOUNTER — Encounter: Payer: Self-pay | Admitting: Family Medicine

## 2015-05-26 ENCOUNTER — Emergency Department (HOSPITAL_COMMUNITY)
Admission: EM | Admit: 2015-05-26 | Discharge: 2015-05-26 | Disposition: A | Discharge status: Home / Self Care | Payer: Self-pay | Attending: Emergency Medicine | Admitting: Emergency Medicine

## 2015-05-26 ENCOUNTER — Telehealth: Payer: Self-pay | Admitting: *Deleted

## 2015-05-26 DIAGNOSIS — R61 Generalized hyperhidrosis: Secondary | ICD-10-CM | POA: Insufficient documentation

## 2015-05-26 DIAGNOSIS — Z720 Tobacco use: Secondary | ICD-10-CM | POA: Insufficient documentation

## 2015-05-26 DIAGNOSIS — I10 Essential (primary) hypertension: Secondary | ICD-10-CM | POA: Insufficient documentation

## 2015-05-26 DIAGNOSIS — Z8619 Personal history of other infectious and parasitic diseases: Secondary | ICD-10-CM | POA: Insufficient documentation

## 2015-05-26 DIAGNOSIS — K5090 Crohn's disease, unspecified, without complications: Secondary | ICD-10-CM | POA: Insufficient documentation

## 2015-05-26 DIAGNOSIS — Z8673 Personal history of transient ischemic attack (TIA), and cerebral infarction without residual deficits: Secondary | ICD-10-CM | POA: Insufficient documentation

## 2015-05-26 DIAGNOSIS — R0602 Shortness of breath: Secondary | ICD-10-CM | POA: Insufficient documentation

## 2015-05-26 DIAGNOSIS — K219 Gastro-esophageal reflux disease without esophagitis: Secondary | ICD-10-CM | POA: Insufficient documentation

## 2015-05-26 DIAGNOSIS — Z79899 Other long term (current) drug therapy: Secondary | ICD-10-CM | POA: Insufficient documentation

## 2015-05-26 DIAGNOSIS — R1012 Left upper quadrant pain: Secondary | ICD-10-CM | POA: Insufficient documentation

## 2015-05-26 DIAGNOSIS — R51 Headache: Secondary | ICD-10-CM

## 2015-05-26 DIAGNOSIS — D518 Other vitamin B12 deficiency anemias: Secondary | ICD-10-CM | POA: Insufficient documentation

## 2015-05-26 DIAGNOSIS — R109 Unspecified abdominal pain: Secondary | ICD-10-CM

## 2015-05-26 DIAGNOSIS — M1990 Unspecified osteoarthritis, unspecified site: Secondary | ICD-10-CM | POA: Insufficient documentation

## 2015-05-26 DIAGNOSIS — F419 Anxiety disorder, unspecified: Secondary | ICD-10-CM | POA: Insufficient documentation

## 2015-05-26 DIAGNOSIS — R197 Diarrhea, unspecified: Secondary | ICD-10-CM | POA: Insufficient documentation

## 2015-05-26 DIAGNOSIS — Z8541 Personal history of malignant neoplasm of cervix uteri: Secondary | ICD-10-CM | POA: Insufficient documentation

## 2015-05-26 DIAGNOSIS — Z9889 Other specified postprocedural states: Secondary | ICD-10-CM | POA: Insufficient documentation

## 2015-05-26 DIAGNOSIS — R519 Headache, unspecified: Secondary | ICD-10-CM

## 2015-05-26 DIAGNOSIS — R0789 Other chest pain: Secondary | ICD-10-CM

## 2015-05-26 DIAGNOSIS — Z872 Personal history of diseases of the skin and subcutaneous tissue: Secondary | ICD-10-CM | POA: Insufficient documentation

## 2015-05-26 DIAGNOSIS — G43909 Migraine, unspecified, not intractable, without status migrainosus: Secondary | ICD-10-CM | POA: Insufficient documentation

## 2015-05-26 DIAGNOSIS — R112 Nausea with vomiting, unspecified: Secondary | ICD-10-CM | POA: Insufficient documentation

## 2015-05-26 LAB — COMPREHENSIVE METABOLIC PANEL
ALT: 26 U/L (ref 14–54)
AST: 35 U/L (ref 15–41)
Albumin: 3.9 g/dL (ref 3.5–5.0)
Alkaline Phosphatase: 102 U/L (ref 38–126)
Anion gap: 10 (ref 5–15)
BUN: 6 mg/dL (ref 6–20)
CO2: 23 mmol/L (ref 22–32)
Calcium: 9 mg/dL (ref 8.9–10.3)
Chloride: 106 mmol/L (ref 101–111)
Creatinine, Ser: 0.89 mg/dL (ref 0.44–1.00)
GFR calc Af Amer: >60 mL/min (ref >60)
GFR calc non Af Amer: >60 mL/min (ref >60)
Glucose, Bld: 118 mg/dL — ABNORMAL HIGH (ref 65–99)
Potassium: 3.4 mmol/L — ABNORMAL LOW (ref 3.5–5.1)
Sodium: 139 mmol/L (ref 135–145)
Total Bilirubin: 0.3 mg/dL (ref 0.3–1.2)
Total Protein: 7.3 g/dL (ref 6.5–8.1)

## 2015-05-26 LAB — I-STAT TROPONIN, ED
Troponin i, poc: 0 ng/mL (ref 0.00–0.08)
Troponin i, poc: 0 ng/mL (ref 0.00–0.08)

## 2015-05-26 LAB — CBC
HCT: 38 % (ref 36.0–46.0)
Hemoglobin: 12.5 g/dL (ref 12.0–15.0)
MCH: 29.2 pg (ref 26.0–34.0)
MCHC: 32.9 g/dL (ref 30.0–36.0)
MCV: 88.8 fL (ref 78.0–100.0)
Platelets: 229 10*3/uL (ref 150–400)
RBC: 4.28 MIL/uL (ref 3.87–5.11)
RDW: 13.8 % (ref 11.5–15.5)
WBC: 9.6 10*3/uL (ref 4.0–10.5)

## 2015-05-26 LAB — I-STAT CG4 LACTIC ACID, ED: Lactic Acid, Venous: 1.42 mmol/L (ref 0.5–2.0)

## 2015-05-26 LAB — LIPASE, BLOOD: Lipase: 30 U/L (ref 22–51)

## 2015-05-26 MED ORDER — IOHEXOL 300 MG/ML  SOLN
100.0000 mL | Freq: Once | INTRAMUSCULAR | Status: AC | PRN
  Administered 2015-05-26: 100 mL via INTRAVENOUS

## 2015-05-26 MED ORDER — MORPHINE SULFATE (PF) 4 MG/ML IV SOLN
4.0000 mg | Freq: Once | INTRAVENOUS | Status: AC
  Administered 2015-05-26: 4 mg via INTRAVENOUS
  Filled 2015-05-26: qty 1

## 2015-05-26 MED ORDER — ONDANSETRON HCL 4 MG/2ML IJ SOLN
4.0000 mg | Freq: Once | INTRAMUSCULAR | Status: AC
  Administered 2015-05-26: 4 mg via INTRAVENOUS
  Filled 2015-05-26: qty 2

## 2015-05-26 MED ORDER — ASPIRIN 81 MG PO CHEW
324.0000 mg | CHEWABLE_TABLET | Freq: Once | ORAL | Status: AC
  Administered 2015-05-26: 324 mg via ORAL
  Filled 2015-05-26: qty 4

## 2015-05-26 MED ORDER — OXYCODONE-ACETAMINOPHEN 5-325 MG PO TABS
1.0000 | ORAL_TABLET | Freq: Once | ORAL | Status: AC
  Administered 2015-05-26: 1 via ORAL
  Filled 2015-05-26: qty 1

## 2015-05-26 MED ORDER — FAMOTIDINE IN NACL 20-0.9 MG/50ML-% IV SOLN
20.0000 mg | Freq: Once | INTRAVENOUS | Status: AC
  Administered 2015-05-26: 20 mg via INTRAVENOUS
  Filled 2015-05-26: qty 50

## 2015-05-26 MED ORDER — ONDANSETRON 4 MG PO TBDP: 4.0000 mg | ORAL_TABLET | Freq: Once | ORAL | Status: DC

## 2015-05-26 MED ORDER — OXYCODONE-ACETAMINOPHEN 5-325 MG PO TABS: ORAL_TABLET | ORAL | Status: DC

## 2015-05-26 MED ORDER — PROMETHAZINE HCL 25 MG PO TABS: 25.0000 mg | ORAL_TABLET | Freq: Four times a day (QID) | ORAL | Status: DC | PRN

## 2015-05-26 NOTE — ED Provider Notes (Signed)
CSN: [id number]     Arrival date & time 05/26/15  1528 History   First MD Initiated Contact with Patient 05/26/15 1759     Chief Complaint  Patient presents with  . Chest Pain  . Headache  . Abdominal Pain     (Consider location/radiation/quality/duration/timing/severity/associated sxs/prior Treatment) HPI  Blood pressure 132/77, pulse 74, temperature 98 F (36.7 C), temperature source Oral, resp. rate 13, SpO2 98 %.  Sharon [name] is a 59 y.o. female  sent by PCP for evaluation of chest, abdominal pain. Past medical history significant for Crohn's, hypertension, tobacco use, TIA, Takasubo cardiomyopathy (nonobstructive catheterization in 2013) complaining of left-sided chest pain described as sharp with radiation of pain to the left arm onset 5 days ago with associated shortness of breath, nausea, diaphoresis and vomiting onset yesterday. Patient took omeprazole at home with little relief. States the pain is 6 out of 10 at worst, 6 out of 10 right now. Patient denies cough, fever, chills, history of DVT or PE, recent immobilizations, calf pain or leg swelling. States she also has a pain in the left upper quadrant and she has loose nonbloody diarrhea. States that this pain is similar to when she had a small bowel obstruction form adhesions. Is noncompliant with her daily aspirin. Patient states she had this severe abdominal pain and took ibuprofen despite being contraindicated with her current Crohn's b/c it's the only thing that helps alleviate the pain.   Past Medical History  Diagnosis Date  . HTN (hypertension)   . Migraines     and frequent other headaches (sinus or stress)  . Crohn's disease 2000    h/o stenotic crohn's, active in TI s/p resection 2000, PPD neg ([facility] Dr. [name]); has been on cimzia, remicade, 6MP, entocort, now stable on Entyvio and 6MP ([facility])  . Cervical cancer 12/2008    s/p hysterectomy  . History of chicken pox   . Anxiety and depression    . History of anemia     attributed to crohn's  . Arthritis   . Genital warts   . History of syphilis 1980s  . Tobacco abuse   . TIA (transient ischemic attack) 05/2010    at [facility] - TIA vs complex migraine (w/u negative - carotids, echo, TC doppler, and MRI normal)  . Hepatic steatosis 04/2011    diffuse on CT, 19m gallbladder polyp  . B12 deficiency     pernicious anemia - monthly b12 shots  . Scalp psoriasis   . GERD (gastroesophageal reflux disease)     severe, daily sxs if off PPI  . Takotsubo syndrome 07/2012    cath 08/01/12, normal coronaries, LVEF 65%  . Sensorineural hearing loss of both ears 12/2012    high freq   Past Surgical History  Procedure Laterality Date  . Appendectomy  1998  . Right colectomy  2000    crohn's disease, ileocecal resection  . Hospitalization  05/2010    TIA vs complex migraine - w/u negative - head CT, MRI/MRA, carotid dopplers, echo.  treated with ASA and tramadol  . [place]ocardiography  05/2010    nl LV fxn ,EF 60%  . [place] 04/2011    diffuse hepatic steatosis, 552mpolyp in gallbaldder fundus, no stones, mod abd ath  . Colonoscopy  10/2008    ileo-colonic anastomosis, ielocolonic crohn's  . Colonoscopy  05/2011    ileo-colonic anastomosis normal, normal mucosa  . Esophagogastroduodenoscopy  05/2011    nl esophagus,  gastritis, nl duodenum  . Cervical biopsy  w/ loop electrode excision  10/2008  . Vaginal hysterectomy  12/2008    [facility]/BSO  . Cardiac catheterization  07/2012    normal LV fxn, widely patent coronaries  . Colonoscopy  10/15/2012    focal inflammation at anastomosis, no active crohn's, planning MR enterograph [name])  . Shoulder arthroscopy w/ rotator cuff repair Right 05/31/2012    [facility] Tamera Punt)  . Left heart catheterization with coronary angiogram N/A 08/01/2012    Procedure: LEFT HEART CATHETERIZATION WITH CORONARY ANGIOGRAM;  Surgeon: Sherren Mocha, MD;  Location: [facility] CATH LAB;  Service: Cardiovascular;   Laterality: N/A;  . Colonoscopy  09/2014    [facility]  . Colon resection  10/2014    removal of scar tissue colon from prior surgery ([facility])   Family History  Problem Relation Age of Onset  . Crohn's disease Daughter     crohn's, ulcerative colitis  . Ulcerative colitis Daughter   . Stroke Mother   . Hypertension Mother   . Hyperlipidemia Mother   . Hypertension Father   . Crohn's disease Father   . Crohn's disease Sister   . Cancer Sister     ovarian  . Crohn's disease Paternal Aunt   . Crohn's disease Paternal Uncle   . Diabetes Neg Hx    Social History  Substance Use Topics  . Smoking status: Current Every Day Smoker -- 0.33 packs/day for 10 years    Types: Cigarettes  . Smokeless tobacco: Never Used  . Alcohol Use: No     Comment: remote EtOh use   OB History    Gravida Para Term Preterm AB TAB SAB Ectopic Multiple Living   4 3   1  1   3      Review of Systems  10 systems reviewed and found to be negative, except as noted in the HPI.   Allergies  Codeine; Flagyl; Hydrocodone; Ibuprofen; and Remeron  Home Medications   Prior to Admission medications   Medication Sig Start Date End Date Taking? Authorizing Provider  acetaminophen (TYLENOL) 500 MG tablet Take 1,000 mg by mouth every 6 (six) hours as needed for headache (headache).   Yes Historical Provider, MD  amitriptyline (ELAVIL) 50 MG tablet Take 1 tablet (50 mg total) by mouth at bedtime. 05/01/15  Yes Ria Bush, MD  citalopram (CELEXA) 40 MG tablet TAKE 1 TABLET BY MOUTH DAILY 05/01/15  Yes Ria Bush, MD  clonazePAM (KLONOPIN) 0.5 MG tablet Take 1 tablet (0.5 mg total) by mouth 2 (two) times daily as needed for anxiety. 05/01/15  Yes Ria Bush, MD  ibuprofen (ADVIL,MOTRIN) 200 MG tablet Take 400 mg by mouth every 6 (six) hours as needed for headache (headache).   Yes Historical Provider, MD  metoprolol (LOPRESSOR) 50 MG tablet Take 1 tablet (50 mg total) by mouth 2 (two) times daily.  05/01/15  Yes Ria Bush, MD  omeprazole (PRILOSEC) 20 MG capsule Take 20 mg by mouth daily.   Yes Historical Provider, MD  vedolizumab (ENTYVIO) 300 MG injection Inject into the vein every 8 (eight) weeks. Last dose was on 05-20-15 Patient not sure of dose. [facility]   Yes Historical Provider, MD  vitamin B-12 (CYANOCOBALAMIN) 1000 MCG tablet Take 1,000 mcg by mouth daily.   Yes Historical Provider, MD  metoprolol succinate (TOPROL-XL) 50 MG 24 hr tablet Take 1 tablet (50 mg total) by mouth at bedtime. Take with or immediately following a meal. Patient not  taking: Reported on 05/26/2015 08/05/14   Ria Bush, MD  oxyCODONE-acetaminophen (PERCOCET/ROXICET) 5-325 MG tablet 1 to 2 tabs PO q6hrs  PRN for pain 05/26/15   Elmyra Ricks Pisciotta, PA-C  promethazine (PHENERGAN) 25 MG tablet Take 1 tablet (25 mg total) by mouth every 6 (six) hours as needed for nausea or vomiting. 05/26/15   Elmyra Ricks Pisciotta, PA-C  zolpidem (AMBIEN) 5 MG tablet TAKE 1 TABLET BY MOUTH AT BEDTIME Patient not taking: Reported on 05/01/2015 11/28/13   Ria Bush, MD   BP 120/68 mmHg  Pulse 69  Temp(Src) 98 F (36.7 C) (Oral)  Resp 21  SpO2 96% Physical Exam  Constitutional: She is oriented to person, place, and time. She appears well-developed and well-nourished. No distress.  HENT:  Head: Normocephalic and atraumatic.  Mouth/Throat: Oropharynx is clear and moist.  Eyes: Conjunctivae and EOM are normal. Pupils are equal, round, and reactive to light.  Neck: Normal range of motion.  Cardiovascular: Normal rate, regular rhythm and intact distal pulses.   Pulmonary/Chest: Effort normal and breath sounds normal.  Abdominal: Soft. There is tenderness.  + TTP in the left upper quadrant no guarding or rebound.   Musculoskeletal: Normal range of motion.  Neurological: She is alert and oriented to person, place, and time.  Skin: She is not diaphoretic.  Psychiatric: She has a normal mood and affect.  Nursing  note and vitals reviewed.   ED Course  Procedures (including critical care time) Labs Review Labs Reviewed  COMPREHENSIVE METABOLIC PANEL - Abnormal; Notable for the following:    Potassium 3.4 (*)    Glucose, Bld 118 (*)    All other components within normal limits  LIPASE, BLOOD  CBC  I-STAT TROPOININ, ED  I-STAT CG4 LACTIC ACID, ED  I-STAT TROPOININ, ED    Imaging Review Dg Chest 2 View  05/26/2015   CLINICAL DATA:  Pt c/o mid to left sided chest pain X 5 days accompanied with n/v/d and headache; abd pain just under left breast for a few days; hx HTN, heart cath, smoker  EXAM: CHEST  2 VIEW  COMPARISON:  01/31/2015  FINDINGS: The heart size and mediastinal contours are within normal limits. Both lungs are clear. The visualized skeletal structures are unremarkable.  IMPRESSION: No active cardiopulmonary disease.   Electronically Signed   By: Nolon Nations M.D.   On: 05/26/2015 16:43   Ct Abdomen Pelvis W Contrast  05/26/2015   CLINICAL DATA:  Right lower quadrant abdomen pain for 5 days  EXAM: CT ABDOMEN AND PELVIS WITH CONTRAST  TECHNIQUE: Multidetector CT imaging of the abdomen and pelvis was performed using the standard protocol following bolus administration of intravenous contrast.  CONTRAST:  169m OMNIPAQUE IOHEXOL 300 MG/ML  SOLN  COMPARISON:  January 31, 2015  FINDINGS: There is diffuse low density of the liver without vessel displacement. No focal liver lesion is identified. The spleen, pancreas, gallbladder, adrenal glands and kidneys are normal. There is no hydronephrosis bilaterally. There is atherosclerosis of the abdominal aorta without aneurysmal dilatation. There is no abdominal lymphadenopathy.  There is no small bowel obstruction. There is changes of prior surgery of the proximal right colon. Patient is status post prior appendectomy.  The bladder is decompressed limiting evaluation without gross abnormality. The lung bases are clear. No acute abnormality is identified  within the visualized bones.  IMPRESSION: No acute abnormality identified in the abdomen and pelvis.  Fatty infiltration of liver.  Status post prior appendectomy and right colon surgery.   Electronically  Signed   By: Abelardo Diesel M.D.   On: 05/26/2015 20:46   I have personally reviewed and evaluated these images and lab results as part of my medical decision-making.   EKG Interpretation None     Normal sinus rhythm at 72 bpm, normal axis, normal interval with no ST-T wave changes MDM   Final diagnoses:  Abdominal pain, acute  Atypical chest pain  Nonintractable headache, unspecified chronicity pattern, unspecified headache type    Filed Vitals:   05/26/15 1900 05/26/15 2000 05/26/15 2100 05/26/15 2237  BP: 117/57 124/59 123/63 120/68  Pulse: 65 69 70 69  Temp:      TempSrc:      Resp: 16 12 15 21   SpO2: 100% 96% 99% 96%    Medications  aspirin chewable tablet 324 mg (324 mg Oral Given 05/26/15 1920)  morphine 4 MG/ML injection 4 mg (4 mg Intravenous Given 05/26/15 1913)  famotidine (PEPCID) IVPB 20 mg premix (0 mg Intravenous Stopped 05/26/15 2103)  ondansetron (ZOFRAN) injection 4 mg (4 mg Intravenous Given 05/26/15 1913)  iohexol (OMNIPAQUE) 300 MG/ML solution 100 mL (100 mLs Intravenous Contrast Given 05/26/15 2028)  morphine 4 MG/ML injection 4 mg (4 mg Intravenous Given 05/26/15 2103)  oxyCODONE-acetaminophen (PERCOCET/ROXICET) 5-325 MG per tablet 1 tablet (1 tablet Oral Given 05/26/15 2233)  ondansetron (ZOFRAN) injection 4 mg (4 mg Intravenous Given 05/26/15 2233)    Astria H Lenzen is a pleasant 59 y.o. female presenting with complaints including chest pain, abdominal pain and headache. Neuro exam is nonfocal, patient is afebrile. Abdominal exam is benign however patient reports he symptoms are similar to prior SBO from adhesions. Patient does have a cardiac history, patient given full dose aspirin and EKG is reassuring with normal troponin. Will CT abdomen pelvis and check a  delta troponin.  CT abdomen pelvis is negative, repeat troponin is also negative. Patient reports significant subjective improvement in symptoms and I've encouraged her to return to the ED for any new or worsening symptoms and otherwise to follow closely with her primary care physician, patient verbalizes her understanding.   Evaluation does not show pathology that would require ongoing emergent intervention or inpatient treatment. Pt is hemodynamically stable and mentating appropriately. Discussed findings and plan with patient/guardian, who agrees with care plan. All questions answered. Return precautions discussed and outpatient follow up given.   Discharge Medication List as of 05/26/2015 10:21 PM    START taking these medications   Details  oxyCODONE-acetaminophen (PERCOCET/ROXICET) 5-325 MG tablet 1 to 2 tabs PO q6hrs  PRN for pain, Print    promethazine (PHENERGAN) 25 MG tablet Take 1 tablet (25 mg total) by mouth every 6 (six) hours as needed for nausea or vomiting., Starting 05/26/2015, Until Discontinued, [employer], PA-C 05/27/15 0028  Virgel Manifold, MD 06/04/15 [phone]

## 2015-05-26 NOTE — Discharge Instructions (Signed)
Take percocet for breakthrough pain, do not drink alcohol, drive, care for children or do other critical tasks while taking percocet.  Please follow with your primary care doctor in the next 2 days for a check-up. They must obtain records for further management.   Do not hesitate to return to the Emergency Department for any new, worsening or concerning symptoms.    Abdominal Pain Many things can cause abdominal pain. Usually, abdominal pain is not caused by a disease and will improve without treatment. It can often be observed and treated at home. Your health care provider will do a physical exam and possibly order blood tests and X-rays to help determine the seriousness of your pain. However, in many cases, more time must pass before a clear cause of the pain can be found. Before that point, your health care provider may not know if you need more testing or further treatment. HOME CARE INSTRUCTIONS  Monitor your abdominal pain for any changes. The following actions may help to alleviate any discomfort you are experiencing:  Only take over-the-counter or prescription medicines as directed by your health care provider.  Do not take laxatives unless directed to do so by your health care provider.  Try a clear liquid diet (broth, tea, or water) as directed by your health care provider. Slowly move to a bland diet as tolerated. SEEK MEDICAL CARE IF:  You have unexplained abdominal pain.  You have abdominal pain associated with nausea or diarrhea.  You have pain when you urinate or have a bowel movement.  You experience abdominal pain that wakes you in the night.  You have abdominal pain that is worsened or improved by eating food.  You have abdominal pain that is worsened with eating fatty foods.  You have a fever. SEEK IMMEDIATE MEDICAL CARE IF:   Your pain does not go away within 2 hours.  You keep throwing up (vomiting).  Your pain is felt only in portions of the abdomen, such  as the right side or the left lower portion of the abdomen.  You pass bloody or black tarry stools. MAKE SURE YOU:  Understand these instructions.   Will watch your condition.   Will get help right away if you are not doing well or get worse.  Document Released: 05/25/2005 Document Revised: 08/20/2013 Document Reviewed: 04/24/2013 [facility] Patient Information 2015 [place], [place]. This information is not intended to replace advice given to you by your health care provider. Make sure you discuss any questions you have with your health care provider.

## 2015-05-26 NOTE — ED Notes (Signed)
Pt tolerating contrast

## 2015-05-26 NOTE — ED Notes (Signed)
Pt sts some CP into left arm with abd pain and HA x 5 days

## 2015-05-26 NOTE — Telephone Encounter (Signed)
Patient was seen 05/01/15.  Metoprolol was increased to 50 mg daily at that time.  Patient called today with update.  Her BP has improved, today was 148/73.  She c/o of intermittent episodes of headache, nausea, chest pain, and numbness/tingling of the left arm that seems to be worsening.  Patient was advised to go to ED based.  She agrees.  Do you have any other recommendations or suggestions?

## 2015-05-27 ENCOUNTER — Telehealth: Payer: Self-pay

## 2015-05-27 NOTE — Telephone Encounter (Signed)
plz call today for f/u after ER visit. Schedule f/u if needed.

## 2015-05-27 NOTE — Telephone Encounter (Signed)
Spoke with patient. Her heart was okay and CT Scan was clear. ER dr thinks ? Stress related. She said she feels much better today and said she didn't need a follow up with you. She will come in if things flare up again though.

## 2015-05-27 NOTE — Telephone Encounter (Signed)
PLEASE NOTE: All timestamps contained within this report are represented as [place] Standard Time. CONFIDENTIALTY NOTICE: This fax transmission is intended only for the addressee. It contains information that is legally privileged, confidential or otherwise protected from use or disclosure. If you are not the intended recipient, you are strictly prohibited from reviewing, disclosing, copying using or disseminating any of this information or taking any action in reliance on or regarding this information. If you have received this fax in error, please notify us immediately by telephone so that we can arrange for its return to [place]. Phone: [phone], Toll-Free: [phone], Fax: [phone] Page: 1 of 1 Call Id: [id number] [place] Patient Name: Sharon [name] Gender: Female DOB: [birth year] Age: 59 Y 31 M 1 D Return Phone Number: [phone] (Primary) Address: City/State/Zip: [place] Client [facility] - Client Client Site [facility] - Day Physician Diona Browner, Amy Contact Type Call [place] Name Maayan V. Caller Phone Number [phone] Relationship To Patient Self Is this call to report lab results? No Call Type General Information Initial Comment Caller states she went to the ED yesterday. Wants to let Dr know the report. She said her heart is fine, it was probably her stomach. They gave her pepcid. General Information Type Message Only Nurse Assessment Guidelines Guideline Title Affirmed Question Affirmed Notes Nurse Date/Time (Eastern Time) Disp. Time Eilene Ghazi Time) Disposition Final User 05/27/2015 12:41:01 PM General Information Provided Yes Erskin Burnet After Care Instructions Given Call Event Type User Date / Time Description

## 2015-05-27 NOTE — Telephone Encounter (Signed)
Spoke with pt to see if she wanted to schedule f/u appt with Dr Darnell Level in 3 days per AVS for ED. Pt said she had already spoken with Maudie Mercury CMA and today pt feels good and does not want to schedule f/u appt. Pt will cb if needed. Will send to Dr Darnell Level as Juluis Rainier.

## 2015-06-02 ENCOUNTER — Other Ambulatory Visit: Payer: Self-pay | Admitting: Family Medicine

## 2015-06-02 NOTE — Telephone Encounter (Signed)
Ok to refill 

## 2015-06-03 ENCOUNTER — Other Ambulatory Visit: Payer: Self-pay | Admitting: Family Medicine

## 2015-06-03 NOTE — Telephone Encounter (Signed)
Pt cking on status of refill; advised called in today at 10:20 am/ pt will ck with pharmacy.

## 2015-06-03 NOTE — Telephone Encounter (Signed)
plz phone in. 

## 2015-06-03 NOTE — Telephone Encounter (Signed)
Rx called in as directed.   

## 2015-07-08 ENCOUNTER — Other Ambulatory Visit: Payer: Self-pay | Admitting: Family Medicine

## 2015-07-08 NOTE — Telephone Encounter (Signed)
plz phone in. 

## 2015-07-09 NOTE — Telephone Encounter (Signed)
Rx called in as prescribed 

## 2015-08-12 ENCOUNTER — Other Ambulatory Visit: Payer: Self-pay | Admitting: Family Medicine

## 2015-08-13 ENCOUNTER — Other Ambulatory Visit: Payer: Self-pay | Admitting: Family Medicine

## 2015-08-13 NOTE — Telephone Encounter (Signed)
plz phone in. 

## 2015-08-13 NOTE — Telephone Encounter (Signed)
Rx called in as directed.   

## 2015-09-17 ENCOUNTER — Other Ambulatory Visit: Payer: Self-pay | Admitting: Family Medicine

## 2015-09-17 NOTE — Telephone Encounter (Signed)
Rx called in as directed.   

## 2015-09-17 NOTE — Telephone Encounter (Signed)
plz phone in. 

## 2015-09-17 NOTE — Telephone Encounter (Signed)
Ok to refill 

## 2015-10-09 ENCOUNTER — Other Ambulatory Visit: Payer: Self-pay | Admitting: Family Medicine

## 2015-10-28 ENCOUNTER — Other Ambulatory Visit: Payer: Self-pay | Admitting: Family Medicine

## 2015-10-29 ENCOUNTER — Other Ambulatory Visit: Payer: Self-pay | Admitting: Family Medicine

## 2015-10-29 NOTE — Telephone Encounter (Signed)
Rx called in as directed.   

## 2015-10-29 NOTE — Telephone Encounter (Signed)
Ok to refill 

## 2015-10-29 NOTE — Telephone Encounter (Signed)
plz phone in. 

## 2015-11-03 ENCOUNTER — Emergency Department
Admission: EM | Admit: 2015-11-03 | Discharge: 2015-11-03 | Disposition: A | Discharge status: Home / Self Care | Payer: Self-pay | Attending: Emergency Medicine | Admitting: Emergency Medicine

## 2015-11-03 ENCOUNTER — Encounter: Payer: Self-pay | Admitting: Emergency Medicine

## 2015-11-03 ENCOUNTER — Emergency Department: Payer: Self-pay

## 2015-11-03 DIAGNOSIS — Z7982 Long term (current) use of aspirin: Secondary | ICD-10-CM | POA: Insufficient documentation

## 2015-11-03 DIAGNOSIS — F17210 Nicotine dependence, cigarettes, uncomplicated: Secondary | ICD-10-CM | POA: Insufficient documentation

## 2015-11-03 DIAGNOSIS — R110 Nausea: Secondary | ICD-10-CM | POA: Insufficient documentation

## 2015-11-03 DIAGNOSIS — R202 Paresthesia of skin: Secondary | ICD-10-CM | POA: Insufficient documentation

## 2015-11-03 DIAGNOSIS — I10 Essential (primary) hypertension: Secondary | ICD-10-CM | POA: Insufficient documentation

## 2015-11-03 DIAGNOSIS — R079 Chest pain, unspecified: Secondary | ICD-10-CM | POA: Insufficient documentation

## 2015-11-03 DIAGNOSIS — Z79899 Other long term (current) drug therapy: Secondary | ICD-10-CM | POA: Insufficient documentation

## 2015-11-03 LAB — CBC WITH DIFFERENTIAL/PLATELET
BASOS ABS: 0.1 10*3/uL (ref 0–0.1)
BASOS PCT: 1 %
EOS ABS: 0.2 10*3/uL (ref 0–0.7)
EOS PCT: 2 %
HCT: 37.3 % (ref 35.0–47.0)
Hemoglobin: 12.7 g/dL (ref 12.0–16.0)
Lymphocytes Relative: 31 %
Lymphs Abs: 2.9 10*3/uL (ref 1.0–3.6)
MCH: 30.9 pg (ref 26.0–34.0)
MCHC: 34.1 g/dL (ref 32.0–36.0)
MCV: 90.5 fL (ref 80.0–100.0)
MONO ABS: 0.8 10*3/uL (ref 0.2–0.9)
Monocytes Relative: 8 %
Neutro Abs: 5.6 10*3/uL (ref 1.4–6.5)
Neutrophils Relative %: 58 %
PLATELETS: 241 10*3/uL (ref 150–440)
RBC: 4.13 MIL/uL (ref 3.80–5.20)
RDW: 14.6 % — AB (ref 11.5–14.5)
WBC: 9.5 10*3/uL (ref 3.6–11.0)

## 2015-11-03 LAB — TROPONIN I

## 2015-11-03 LAB — BASIC METABOLIC PANEL
ANION GAP: 11 (ref 5–15)
BUN: 10 mg/dL (ref 6–20)
CALCIUM: 8.8 mg/dL — AB (ref 8.9–10.3)
CO2: 21 mmol/L — AB (ref 22–32)
Chloride: 110 mmol/L (ref 101–111)
Creatinine, Ser: 0.61 mg/dL (ref 0.44–1.00)
GFR calc Af Amer: >60 mL/min (ref >60)
Glucose, Bld: 92 mg/dL (ref 65–99)
Potassium: 3.7 mmol/L (ref 3.5–5.1)
SODIUM: 142 mmol/L (ref 135–145)

## 2015-11-03 MED ORDER — PROMETHAZINE HCL 25 MG PO TABS
25.0000 mg | ORAL_TABLET | Freq: Once | ORAL | Status: AC
  Administered 2015-11-03: 25 mg via ORAL
  Filled 2015-11-03: qty 1

## 2015-11-03 MED ORDER — GI COCKTAIL ~~LOC~~
30.0000 mL | Freq: Once | ORAL | Status: AC
  Administered 2015-11-03: 30 mL via ORAL
  Filled 2015-11-03: qty 30

## 2015-11-03 MED ORDER — ACETAMINOPHEN 325 MG PO TABS
650.0000 mg | ORAL_TABLET | Freq: Once | ORAL | Status: AC
  Administered 2015-11-03: 650 mg via ORAL
  Filled 2015-11-03: qty 2

## 2015-11-03 NOTE — ED Provider Notes (Signed)
[facility] Emergency Department Provider Note   ____________________________________________  Time seen: Upon ED arrival I have reviewed the triage vital signs and the triage nursing note.  HISTORY  Chief Complaint Chest Pain   Historian Patient  HPI Sharon [name] is a 60 y.o. female with a history of hypertension, anxiety, and GERD states that today at work she developed central chest pain that went over to the left arm associated with nausea and left arm tingling. It happened a few times since then. She had nausea without vomiting. No diarrhea. No additional abdominal pain. States it does not feel like typical anxiety or reflux to her. She has done a stress test in the remote past, and does not follow with a cardiologist for any reason.  No recent cough congestion or fevers.    Past Medical History  Diagnosis Date  . HTN (hypertension)   . Migraines     and frequent other headaches (sinus or stress)  . Crohn's disease ([place]) 2000    h/o stenotic crohn's, active in TI s/p resection 2000, PPD neg ([facility] Dr. [name]); has been on cimzia, remicade, 6MP, entocort, now stable on Entyvio and 6MP ([facility] [place])  . Cervical cancer ([place]) 12/2008    s/p hysterectomy  . History of chicken pox   . Anxiety and depression   . History of anemia     attributed to crohn's  . Arthritis   . Genital warts   . History of syphilis 1980s  . Tobacco abuse   . TIA (transient ischemic attack) 05/2010    at [facility] - TIA vs complex migraine (w/u negative - carotids, echo, TC doppler, and MRI normal)  . Hepatic steatosis 04/2011    diffuse on CT, 59m gallbladder polyp  . B12 deficiency     pernicious anemia - monthly b12 shots  . Scalp psoriasis   . GERD (gastroesophageal reflux disease)     severe, daily sxs if off PPI  . Takotsubo syndrome 07/2012    cath 08/01/12, normal coronaries, LVEF 65%  . Sensorineural hearing loss of both ears 12/2012    high freq     Patient Active Problem List   Diagnosis Date Noted  . Hyperglycemia 05/09/2013  . HLD (hyperlipidemia) 02/12/2013  . Diarrhea 12/18/2012  . Right knee pain 11/13/2012  . Takotsubo syndrome   . Chest pain 08/02/2012  . Bilateral hearing loss 06/27/2012  . GERD (gastroesophageal reflux disease)   . Skin rash 04/27/2012  . Right shoulder pain 03/13/2012  . History of pneumonia 01/18/2012  . Headache(784.0) 10/13/2011  . Healthcare maintenance 08/16/2011  . Leukopenia 08/16/2011  . Vitamin B12 deficiency   . HTN (hypertension)   . Migraines   . [place]   . Anxiety and depression   . Smoker   . TIA (transient ischemic attack)   . Hepatic steatosis 04/30/2011    Past Surgical History  Procedure Laterality Date  . Appendectomy  1998  . Right colectomy  2000    crohn's disease, ileocecal resection  . Hospitalization  05/2010    TIA vs complex migraine - w/u negative - head CT, MRI/MRA, carotid dopplers, echo.  treated with ASA and tramadol  . [place]ore[place]chocardiography  05/2010    nl LV fxn ,EF 60%  . Abd [place] 04/2011    diffuse hepatic steatosis, 521mpolyp in gallbaldder fundus, no stones, mod abd ath  . Colonoscopy  10/2008    ileo-colonic anastomosis, ielocolonic crohn's  .  Colonoscopy  05/2011    ileo-colonic anastomosis normal, normal mucosa  . Esophagogastroduodenoscopy  05/2011    nl esophagus, gastritis, nl duodenum  . Cervical biopsy  w/ loop electrode excision  10/2008  . Vaginal hysterectomy  12/2008    [facility]/BSO  . Cardiac catheterization  07/2012    normal LV fxn, widely patent coronaries  . Colonoscopy  10/15/2012    focal inflammation at anastomosis, no active crohn's, planning MR enterograph [name])  . Shoulder arthroscopy w/ rotator cuff repair Right 05/31/2012    [facility] Tamera Punt)  . Left heart catheterization with coronary angiogram N/A 08/01/2012    Procedure: LEFT HEART CATHETERIZATION WITH CORONARY ANGIOGRAM;  Surgeon: Sherren Mocha,  MD;  Location: [facility] CATH LAB;  Service: Cardiovascular;  Laterality: N/A;  . Colonoscopy  09/2014    [facility]  . Colon resection  10/2014    removal of scar tissue colon from prior surgery ([facility])    Current Outpatient Rx  Name  Route  Sig  Dispense  Refill  . acetaminophen (TYLENOL) 500 MG tablet   Oral   Take 1,000 mg by mouth every 6 (six) hours as needed for mild pain or headache.          Marland Kitchen amitriptyline (ELAVIL) 50 MG tablet   Oral   Take 1 tablet (50 mg total) by mouth at bedtime.   30 tablet   6   . aspirin EC 81 MG tablet   Oral   Take 81 mg by mouth daily.         . citalopram (CELEXA) 40 MG tablet   Oral   Take 40 mg by mouth daily.         . clonazePAM (KLONOPIN) 0.5 MG tablet   Oral   Take 0.5 mg by mouth 2 (two) times daily as needed for anxiety.         . cyanocobalamin (,VITAMIN B-12,) 1000 MCG/ML injection   Intramuscular   Inject 1,000 mcg into the muscle every 30 (thirty) days.         . diphenhydrAMINE (BENADRYL) 25 MG tablet   Oral   Take 25 mg by mouth every 6 (six) hours as needed for allergies.         . metoprolol (LOPRESSOR) 50 MG tablet   Oral   Take 1 tablet (50 mg total) by mouth 2 (two) times daily.   60 tablet   11   . omeprazole (PRILOSEC) 20 MG capsule   Oral   Take 20 mg by mouth daily.         . promethazine (PHENERGAN) 25 MG tablet   Oral   Take 1 tablet (25 mg total) by mouth every 6 (six) hours as needed for nausea or vomiting.   12 tablet   0   . vedolizumab (ENTYVIO) 300 MG injection   Intravenous   Inject into the vein every 8 (eight) weeks.          Marland Kitchen zolpidem (AMBIEN) 5 MG tablet   Oral   Take 5 mg by mouth at bedtime as needed for sleep.           Allergies Codeine; Flagyl; Hydrocodone; Ibuprofen; and Remeron  Family History  Problem Relation Age of Onset  . Crohn's disease Daughter     crohn's, ulcerative colitis  . Ulcerative colitis Daughter   . Stroke Mother   .  Hypertension Mother   . Hyperlipidemia Mother   . Hypertension Father   .  Crohn's disease Father   . Crohn's disease Sister   . Cancer Sister     ovarian  . Crohn's disease Paternal Aunt   . Crohn's disease Paternal Uncle   . Diabetes Neg Hx     Social History Social History  Substance Use Topics  . Smoking status: Current Every Day Smoker -- 0.33 packs/day for 10 years    Types: Cigarettes  . Smokeless tobacco: Never Used  . Alcohol Use: No     Comment: remote EtOh use    Review of Systems  Constitutional: Negative for fever. Eyes: Negative for visual changes. ENT: Negative for sore throat. Cardiovascular: Positive for chest pain. Negative for palpitations Respiratory: Negative for shortness of breath. Gastrointestinal: Negative for abdominal pain, vomiting and diarrhea. Genitourinary: Negative for dysuria. Musculoskeletal: Negative for back pain. Skin: Negative for rash. Neurological: Negative for headache. 10 point Review of Systems otherwise negative ____________________________________________   PHYSICAL EXAM:  VITAL SIGNS: ED Triage Vitals  Enc Vitals Group     BP 11/03/15 1753 115/62 mmHg     Pulse Rate 11/03/15 1753 84     Resp 11/03/15 1753 17     Temp 11/03/15 1753 98.3 F (36.8 C)     Temp Source 11/03/15 1753 Oral     SpO2 11/03/15 1753 96 %     Weight 11/03/15 1753 145 lb (65.772 kg)     Height 11/03/15 1753 5' 1"  (1.549 m)     Head Cir --      Peak Flow --      Pain Score 11/03/15 1748 7     Pain Loc --      Pain Edu? --      Excl. in [place]? --      Constitutional: Alert and oriented. Well appearing and in no distress. HEENT   Head: Normocephalic and atraumatic.      Eyes: Conjunctivae are normal. PERRL. Normal extraocular movements.      Ears:         Nose: No congestion/rhinnorhea.   Mouth/Throat: Mucous membranes are moist.   Neck: No stridor. Cardiovascular/Chest: Normal rate, regular rhythm.  No murmurs, rubs, or  gallops. Respiratory: Normal respiratory effort without tachypnea nor retractions. Breath sounds are clear and equal bilaterally. No wheezes/rales/rhonchi. Gastrointestinal: Soft. No distention, no guarding, no rebound. Nontender.    Genitourinary/rectal:Deferred Musculoskeletal: Nontender with normal range of motion in all extremities. No joint effusions.  No lower extremity tenderness.  No edema. Neurologic:  Normal speech and language. No gross or focal neurologic deficits are appreciated. Skin:  Skin is warm, dry and intact. No rash noted. Psychiatric: Mood and affect are normal. Speech and behavior are normal. Patient exhibits appropriate insight and judgment.  ____________________________________________   EKG I, Lisa Roca, MD, the attending physician have personally viewed and interpreted all ECGs.  70 beats per. Normal sinus rhythm. Narrow QRS. Normal axis. Nonspecific T-wave ____________________________________________  LABS (pertinent positives/negatives)  CBC within normal limits Basic metabolic panel within normal limits Troponin less than 0.03  ____________________________________________  RADIOLOGY All Xrays were viewed by me. Imaging interpreted by Radiologist.  Chest: No active cardiopulmonary disease __________________________________________  PROCEDURES  Procedure(s) performed: None  Critical Care performed: None  ____________________________________________   ED COURSE / ASSESSMENT AND PLAN  Pertinent labs & imaging results that were available during my care of the patient were reviewed by me and considered in my medical decision making (see chart for details).   Patient's here with atypical chest pain. Risk factor of  hypertension for heart disease. Symptoms do not sound like aortic emergency. Symptoms do not sound like a PE. Symptoms not following stroke.  EKG is reassuring. Labs are reassuring.  Repeat troponin will be performed at 3 hours.  This will be about 6 hours from onset of pain.  She will be referred to follow-up with a cardiologist in the next 1-2 days.  Patient care transferred to Dr. Marcelene Butte at 9 PM. I anticipate discharge home after repeat troponin which is currently pending. Patient will be sent with my discharge instructions.    CONSULTATIONS:  None   Patient / Family / Caregiver informed of clinical course, medical decision-making process, and agree with plan.   I discussed return precautions, follow-up instructions, and discharged instructions with patient and/or family.   ___________________________________________   FINAL CLINICAL IMPRESSION(S) / ED DIAGNOSES   Final diagnoses:  Chest pain, unspecified              Note: This dictation was prepared with Dragon dictation. Any transcriptional errors that result from this process are unintentional   Lisa Roca, MD 11/03/15 2034

## 2015-11-03 NOTE — ED Provider Notes (Signed)
-----------------------------------------   9:20 PM on 11/03/2015 -----------------------------------------   Blood pressure 102/59, pulse 81, temperature 98.3 F (36.8 C), temperature source Oral, resp. rate 18, height 5' 1"  (1.549 m), weight 145 lb (65.772 kg), SpO2 97 %.  Assuming care from Dr. Reita Cliche.  In short, Sharon [name] is a 60 y.o. female with a chief complaint of Chest Pain .  Refer to the original H&P for additional details.  The current plan of care is to follow the patient's repeat troponin which was negative. Patient will be discharged per Dr. Alinda Money instructions.   [name], MD 11/03/15 2120

## 2015-11-03 NOTE — ED Notes (Signed)
Patient transported to X-ray 

## 2015-11-03 NOTE — Discharge Instructions (Signed)
You were evaluated for chest pain and your exam and evaluation are reassuring today in the emergency department. We discussed follow-up with a cardiologist, call in the morning for an appointment next 1-2 days.  Return to the emergency department for any new or worsening condition including worsening chest pain, weakness, numbness, confusion or altered mental status, trouble breathing, or any other symptoms concerning to you.   Nonspecific Chest Pain It is often hard to find the cause of chest pain. There is always a chance that your pain could be related to something serious, such as a heart attack or a blood clot in your lungs. Chest pain can also be caused by conditions that are not life-threatening. If you have chest pain, it is very important to follow up with your doctor.  HOME CARE  If you were prescribed an antibiotic medicine, finish it all even if you start to feel better.  Avoid any activities that cause chest pain.  Do not use any tobacco products, including cigarettes, chewing tobacco, or electronic cigarettes. If you need help quitting, ask your doctor.  Do not drink alcohol.  Take medicines only as told by your doctor.  Keep all follow-up visits as told by your doctor. This is important. This includes any further testing if your chest pain does not go away.  Your doctor may tell you to keep your head raised (elevated) while you sleep.  Make lifestyle changes as told by your doctor. These may include:  Getting regular exercise. Ask your doctor to suggest some activities that are safe for you.  Eating a heart-healthy diet. Your doctor or a diet specialist (dietitian) can help you to learn healthy eating options.  Maintaining a healthy weight.  Managing diabetes, if necessary.  Reducing stress. GET HELP IF:  Your chest pain does not go away, even after treatment.  You have a rash with blisters on your chest.  You have a fever. GET HELP RIGHT AWAY IF:  Your  chest pain is worse.  You have an increasing cough, or you cough up blood.  You have severe belly (abdominal) pain.  You feel extremely weak.  You pass out (faint).  You have chills.  You have sudden, unexplained chest discomfort.  You have sudden, unexplained discomfort in your arms, back, neck, or jaw.  You have shortness of breath at any time.  You suddenly start to sweat, or your skin gets clammy.  You feel nauseous.  You vomit.  You suddenly feel light-headed or dizzy.  Your heart begins to beat quickly, or it feels like it is skipping beats. These symptoms may be an emergency. Do not wait to see if the symptoms will go away. Get medical help right away. Call your local emergency services (911 in the [place] Do not drive yourself to the hospital.   This information is not intended to replace advice given to you by your health care provider. Make sure you discuss any questions you have with your health care provider.   Document Released: 02/01/2008 Document Revised: 09/05/2014 Document Reviewed: 03/21/2014 Elsevier Interactive Patient Education [employer].

## 2015-11-03 NOTE — ED Notes (Signed)
Pt was at work and called ems for central CP. Pt does report hx anxiety and reflux and not sure if these are part of pain.

## 2015-11-05 ENCOUNTER — Telehealth: Payer: Self-pay | Admitting: Cardiovascular Disease

## 2015-11-05 NOTE — Telephone Encounter (Signed)
lmov to make ED fu from 11/05/15

## 2015-11-06 ENCOUNTER — Telehealth: Payer: Self-pay | Admitting: Family Medicine

## 2015-11-06 NOTE — Telephone Encounter (Signed)
Patient Name: Sharon [name] DOB: 11-08-1955 Initial Comment Caller states had an anxiety attack on Tuesday, bp elevated Nurse Assessment Nurse: Marcelline Deist, RN, Kermit Balo Date/Time (Eastern Time): 11/06/2015 9:53:42 AM Confirm and document reason for call. If symptomatic, describe symptoms. You must click the next button to save text entered. ---Caller states had an anxiety attack on Tuesday, BP elevated. The rx that she takes for the anxiety helps, not completely. Was taken by ambulance to the the hospital. She was shaky, her chest was hurting, SOB, BP elevated, etc. Everything was fine with her heart, they wanted her to have a stress test, but she didn't. She still feels nervous with same symptoms. Has the patient traveled out of the country within the last 30 days? ---Not Applicable Does the patient have any new or worsening symptoms? ---Yes Will a triage be completed? ---Yes Related visit to physician within the last 2 weeks? ---Yes Does the PT have any chronic conditions? (i.e. diabetes, asthma, etc.) ---Yes List chronic conditions. ---on BP rx, Crohn's disease, anxiety Is this a behavioral health or substance abuse call? ---No Guidelines Guideline Title Affirmed Question Affirmed Notes Anxiety and Panic Attack Symptoms interfere with work or school Final Disposition User See Physician within [place], Therapist, sports, [place] Comments If any appts. open up today at the office, please let caller know. She was scheduled for Monday with her provider. She feels her BP is within normal range today, however she has not completely recovered from the symptoms she had on Tuesday with the anxiety attack. She wants her provider to be aware & if there is any further advice to contact her. Referrals GO TO FACILITY UNDECIDED REFERRED TO PCP OFFICE Disagree/Comply: Comply

## 2015-11-06 NOTE — Telephone Encounter (Signed)
Appointment with PCP 11/09/15

## 2015-11-09 ENCOUNTER — Encounter: Payer: Self-pay | Admitting: Family Medicine

## 2015-11-09 ENCOUNTER — Ambulatory Visit (INDEPENDENT_AMBULATORY_CARE_PROVIDER_SITE_OTHER): Payer: Self-pay | Admitting: Family Medicine

## 2015-11-09 VITALS — BP 140/72 | HR 66 | Temp 97.9°F | Wt 164.5 lb

## 2015-11-09 DIAGNOSIS — F329 Major depressive disorder, single episode, unspecified: Secondary | ICD-10-CM

## 2015-11-09 DIAGNOSIS — F17200 Nicotine dependence, unspecified, uncomplicated: Secondary | ICD-10-CM

## 2015-11-09 DIAGNOSIS — R079 Chest pain, unspecified: Secondary | ICD-10-CM

## 2015-11-09 DIAGNOSIS — F419 Anxiety disorder, unspecified: Secondary | ICD-10-CM

## 2015-11-09 DIAGNOSIS — Z720 Tobacco use: Secondary | ICD-10-CM

## 2015-11-09 DIAGNOSIS — F32A Depression, unspecified: Secondary | ICD-10-CM

## 2015-11-09 DIAGNOSIS — F418 Other specified anxiety disorders: Secondary | ICD-10-CM

## 2015-11-09 DIAGNOSIS — K50919 Crohn's disease, unspecified, with unspecified complications: Secondary | ICD-10-CM

## 2015-11-09 MED ORDER — PANTOPRAZOLE SODIUM 20 MG PO TBEC: 20.0000 mg | DELAYED_RELEASE_TABLET | Freq: Every day | ORAL | Status: DC

## 2015-11-09 MED ORDER — AMITRIPTYLINE HCL 50 MG PO TABS: 75.0000 mg | ORAL_TABLET | Freq: Every day | ORAL | Status: DC

## 2015-11-09 MED ORDER — CITALOPRAM HYDROBROMIDE 40 MG PO TABS: 40.0000 mg | ORAL_TABLET | Freq: Every day | ORAL | Status: DC

## 2015-11-09 NOTE — Progress Notes (Signed)
BP 140/72 mmHg  Pulse 66  Temp(Src) 97.9 F (36.6 C) (Oral)  Wt 164 lb 8 oz (74.617 kg)  SpO2 97%   CC: discuss worsening anxiety  Subjective:    Patient ID: Sharon [name], female    DOB: 08-27-1956, 60 y.o.   MRN: [id number]  HPI: Sharon [name] is a 60 y.o. female presenting on 11/09/2015 for Anxiety   Seen at ER with chest pain. Records reviewed. troponins negative. EKG and CXR normal. rec f/u with cardiologist. Since then, shaky, trouble focusing, headache, persistent intermittent chest pain, mild dizziness and fatigue and nausea. Mild dyspnea. No fevers/chills, cough, vomiting, palpitations or arrhythmias. Chest pain described as shooting pains. Cardiac risk factors include smoker and hypertension, no family history of CAD. Continues smoking 1/3 ppd.   Anxiety - on amitriptyline 34m nightly, celexa 436mdaily and klonopin 0.24m69mnce to twice daily. Not interested in counselor at this time. Feels very sleepy.   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   Noticing worsening trouble with concentration, trouble sleeping at night time (trouble shutting off mind). Has been on ambien nightly PRN, no recent use. Valium caused hyperactivity and increased activity/energy in a bad way. Prior lorazepam effective but noted tolerance developing.   HTN - last visit we increased metoprolol to 62m68md.  Crohn's - well controlled with entyvio ([facility]).  H/o takotsubo syndrome s/p catheterization 07/2012 with normal coronaries and LVEF 65%.   Relevant past medical, surgical, family and social history reviewed and updated as indicated. Interim medical history since our last visit reviewed. Allergies and medications reviewed and updated. Current Outpatient Prescriptions on File Prior to Visit  Medication Sig  . acetaminophen (TYLENOL) 500 MG tablet Take 1,000 mg by mouth every 6 (six) hours as needed for mild pain or headache.   . asMarland Kitchenirin EC 81 MG  tablet Take 81 mg by mouth daily.  . cyanocobalamin (,VITAMIN B-12,) 1000 MCG/ML injection Inject 1,000 mcg into the muscle every 30 (thirty) days.  . diphenhydrAMINE (BENADRYL) 25 MG tablet Take 25 mg by mouth every 6 (six) hours as needed for allergies.  . metoprolol (LOPRESSOR) 50 MG tablet Take 1 tablet (50 mg total) by mouth 2 (two) times daily.  . vedolizumab (ENTYVIO) 300 MG injection Inject into the vein every 8 (eight) weeks.   . promethazine (PHENERGAN) 25 MG tablet Take 1 tablet (25 mg total) by mouth every 6 (six) hours as needed for nausea or vomiting. (Patient not taking: Reported on 11/09/2015)  . zolpidem (AMBIEN) 5 MG tablet Take 5 mg by mouth at bedtime as needed for sleep. Reported on 11/09/2015   No current facility-administered medications on file prior to visit.    Review of Systems Per HPI unless specifically indicated in ROS section     Objective:    BP 140/72 mmHg  Pulse 66  Temp(Src) 97.9 F (36.6 C) (Oral)  Wt 164 lb 8 oz (74.617 kg)  SpO2 97%  Wt Readings from Last 3 Encounters:  11/09/15 164 lb 8 oz (74.617 kg)  11/03/15 145 lb (65.772 kg)  05/01/15 150 lb 12 oz (68.38 kg)    Physical Exam  Constitutional: She appears well-developed and well-nourished. No distress.  HENT:  Head: Normocephalic and atraumatic.  Mouth/Throat: Oropharynx is clear and moist. No oropharyngeal exudate.  Neck: Normal range of motion. Neck supple.  Cardiovascular: Normal rate, regular rhythm, normal heart sounds and intact distal pulses.   No murmur  heard. Pulmonary/Chest: Effort normal and breath sounds normal. No respiratory distress. She has no wheezes. She has no rales.  Musculoskeletal: She exhibits no edema.  Lymphadenopathy:    She has no cervical adenopathy.  Skin: Skin is warm and dry. No rash noted.  Psychiatric: She has a normal mood and affect.  Nursing note and vitals reviewed.  Results for orders placed or performed during the hospital encounter of 11/03/15    CBC with Differential  Result Value Ref Range   WBC 9.5 3.6 - 11.0 K/uL   RBC 4.13 3.80 - 5.20 MIL/uL   Hemoglobin 12.7 12.0 - 16.0 g/dL   HCT 37.3 35.0 - 47.0 %   MCV 90.5 80.0 - 100.0 fL   MCH 30.9 26.0 - 34.0 pg   MCHC 34.1 32.0 - 36.0 g/dL   RDW 14.6 (H) 11.5 - 14.5 %   Platelets 241 150 - 440 K/uL   Neutrophils Relative % 58 %   Neutro Abs 5.6 1.4 - 6.5 K/uL   Lymphocytes Relative 31 %   Lymphs Abs 2.9 1.0 - 3.6 K/uL   Monocytes Relative 8 %   Monocytes Absolute 0.8 0.2 - 0.9 K/uL   Eosinophils Relative 2 %   Eosinophils Absolute 0.2 0 - 0.7 K/uL   Basophils Relative 1 %   Basophils Absolute 0.1 0 - 0.1 K/uL  Basic metabolic panel  Result Value Ref Range   Sodium 142 135 - 145 mmol/L   Potassium 3.7 3.5 - 5.1 mmol/L   Chloride 110 101 - 111 mmol/L   CO2 21 (L) 22 - 32 mmol/L   Glucose, Bld 92 65 - 99 mg/dL   BUN 10 6 - 20 mg/dL   Creatinine, Ser 0.61 0.44 - 1.00 mg/dL   Calcium 8.8 (L) 8.9 - 10.3 mg/dL   GFR calc non Af Amer >60 >60 mL/min   GFR calc Af Amer >60 >60 mL/min   Anion gap 11 5 - 15  Troponin I  Result Value Ref Range   Troponin I <0.03 <0.031 ng/mL  Troponin I  Result Value Ref Range   Troponin I <0.03 <0.031 ng/mL      Assessment & Plan:   Problem List Items Addressed This Visit    Smoker    Continue to encourage cessation.      Crohn's disease ([place])    Stable on current regimen of entyvio      Chest pain    Very atypical ?anxiety related - discussed cards referral and pt prefers to change anti anxiety regimen first. See above. If persistent discomfort or no improvement noted, will call cards to schedule f/u appt.       Anxiety and depression - Primary    Will change anti-anxietal regimen - increase amitriptyline to 52m nightly, continue celexa 4834maily, decrease klonopin to 0.25-0.34m62mightly due to excess daytime somnolence. Update with effect in 2-3 weeks. PHQ9 = 12/27, somewhat difficult to function GAD7 = 11/21           Follow up plan: Return if symptoms worsen or fail to improve.

## 2015-11-09 NOTE — Assessment & Plan Note (Signed)
Very atypical ?anxiety related - discussed cards referral and pt prefers to change anti anxiety regimen first. See above. If persistent discomfort or no improvement noted, will call cards to schedule f/u appt.

## 2015-11-09 NOTE — Assessment & Plan Note (Signed)
Continue to encourage cessation.

## 2015-11-09 NOTE — Progress Notes (Signed)
Pre visit review using our clinic review tool, if applicable. No additional management support is needed unless otherwise documented below in the visit note. 

## 2015-11-09 NOTE — Patient Instructions (Signed)
Let's decrease klonopin to 0.5-1 tablet nightly only - to help with sleep. Increase amitriptyline to 84m nightly (1.5 tab). Change omeprazole to pantoprazole 224mdaily.  New doses sent to pharmacy. Update me with effect - if persistent chest discomfort, call cardiology for follow up

## 2015-11-09 NOTE — Assessment & Plan Note (Addendum)
Will change anti-anxietal regimen - increase amitriptyline to 54m nightly, continue celexa 428maily, decrease klonopin to 0.25-0.29m24mightly due to excess daytime somnolence. Update with effect in 2-3 weeks. PHQ9 = 12/27, somewhat difficult to function GAD7 = 11/21

## 2015-11-09 NOTE — Assessment & Plan Note (Signed)
Stable on current regimen of entyvio

## 2015-11-13 ENCOUNTER — Telehealth: Payer: Self-pay

## 2015-11-13 NOTE — Telephone Encounter (Signed)
Noted. Per last note pt was to decrease klonopin not increase. Will send note to Dr. Darnell Level.

## 2015-11-13 NOTE — Telephone Encounter (Signed)
Pt left v/m; pt taking klonopin 0.5 mg taking 1/2 tab three times a day and it seems to be helping nervousness. FYI to Dr Darnell Level. Dr Darnell Level is out of office and note sent to Dr Diona Browner.

## 2015-11-14 NOTE — Telephone Encounter (Signed)
Noted. Ok to continue klonopin 0.58m 1/2 tab TID as long as not worsening sleepiness - but still recommend PRN use

## 2015-11-16 NOTE — Telephone Encounter (Signed)
Message left advising patient.

## 2015-12-10 ENCOUNTER — Ambulatory Visit (INDEPENDENT_AMBULATORY_CARE_PROVIDER_SITE_OTHER): Payer: Self-pay | Admitting: Family Medicine

## 2015-12-10 ENCOUNTER — Encounter: Payer: Self-pay | Admitting: Family Medicine

## 2015-12-10 VITALS — BP 127/80 | HR 63 | Temp 98.2°F | Ht 61.0 in | Wt 165.2 lb

## 2015-12-10 DIAGNOSIS — H6011 Cellulitis of right external ear: Secondary | ICD-10-CM

## 2015-12-10 DIAGNOSIS — H65111 Acute and subacute allergic otitis media (mucoid) (sanguinous) (serous), right ear: Secondary | ICD-10-CM

## 2015-12-10 MED ORDER — AMOXICILLIN-POT CLAVULANATE 875-125 MG PO TABS: 1.0000 | ORAL_TABLET | Freq: Two times a day (BID) | ORAL | Status: DC

## 2015-12-10 NOTE — Progress Notes (Signed)
Dr. Frederico Hamman T. Ketra Duchesne, MD, [facility] [place], [address] Phone: [phone] Fax: [phone]  12/10/2015  Patient: Sharon [name], MRN: [id number], DOB: 1955/11/21, 60 y.o.  Primary Physician:  Ria Bush, MD   Chief Complaint  Patient presents with  . Ear lobe swollen    Right  . Knot under ear lobe  . Headache  . Dizziness   Subjective:   Sharon [name] is a 60 y.o. very pleasant female patient who presents with the following:  R ear is infected.  Lobe drum  patient is been having some right-sided pain primarily in the earlobe, on the inferior aspect, and over the last few days this is gotten progressively larger and is red and warm to touch. She also has some lymph nodes on the posterior aspect of her ear. She also has been having some headaches, dizziness, and some pain in side of her ear.  Past Medical History, Surgical History, Social History, Family History, Problem List, Medications, and Allergies have been reviewed and updated if relevant.  Patient Active Problem List   Diagnosis Date Noted  . Hyperglycemia 05/09/2013  . HLD (hyperlipidemia) 02/12/2013  . Diarrhea 12/18/2012  . Right knee pain 11/13/2012  . Takotsubo syndrome   . Chest pain 08/02/2012  . Bilateral hearing loss 06/27/2012  . GERD (gastroesophageal reflux disease)   . Skin rash 04/27/2012  . Right shoulder pain 03/13/2012  . History of pneumonia 01/18/2012  . Headache(784.0) 10/13/2011  . Healthcare maintenance 08/16/2011  . Leukopenia 08/16/2011  . Vitamin B12 deficiency   . HTN (hypertension)   . Migraines   . Crohn's disease ([place])   . Anxiety and depression   . Smoker   . TIA (transient ischemic attack)   . Hepatic steatosis 04/30/2011    Past Medical History  Diagnosis Date  . HTN (hypertension)   . Migraines     and frequent other headaches (sinus or stress)  . Crohn's disease ([place]) 2000    h/o  stenotic crohn's, active in TI s/p resection 2000, PPD neg ([facility] Dr. [name]); has been on cimzia, remicade, 6MP, entocort, now stable on Entyvio and 6MP ([facility] [place])  . Cervical cancer ([place]) 12/2008    s/p hysterectomy  . History of chicken pox   . Anxiety and depression   . History of anemia     attributed to crohn's  . Arthritis   . Genital warts   . History of syphilis 1980s  . Tobacco abuse   . TIA (transient ischemic attack) 05/2010    at [facility] - TIA vs complex migraine (w/u negative - carotids, echo, TC doppler, and MRI normal)  . Hepatic steatosis 04/2011    diffuse on CT, 95m gallbladder polyp  . B12 deficiency     pernicious anemia - monthly b12 shots  . Scalp psoriasis   . GERD (gastroesophageal reflux disease)     severe, daily sxs if off PPI  . Takotsubo syndrome 07/2012    cath 08/01/12, normal coronaries, LVEF 65%  . Sensorineural hearing loss of both ears 12/2012    high freq    Past Surgical History  Procedure Laterality Date  . Appendectomy  1998  . Right colectomy  2000    crohn's disease, ileocecal resection  . Hospitalization  05/2010    TIA vs complex migraine - w/u negative - head CT, MRI/MRA, carotid dopplers, echo.  treated with ASA and tramadol  .  US echocardiography  05/2010    nl LV fxn ,EF 60%  . Abd [place]  04/2011    diffuse hepatic steatosis, 62m polyp in gallbaldder fundus, no stones, mod abd ath  . Colonoscopy  10/2008    ileo-colonic anastomosis, ielocolonic crohn's  . Colonoscopy  05/2011    ileo-colonic anastomosis normal, normal mucosa  . Esophagogastroduodenoscopy  05/2011    nl esophagus, gastritis, nl duodenum  . Cervical biopsy  w/ loop electrode excision  10/2008  . Vaginal hysterectomy  12/2008    LAVH/BSO  . Cardiac catheterization  07/2012    normal LV fxn, widely patent coronaries  . Colonoscopy  10/15/2012    focal inflammation at anastomosis, no active crohn's, planning MR enterograph ([name]  . Shoulder arthroscopy  w/ rotator cuff repair Right 05/31/2012    [facility] (Tamera Punt  . Left heart catheterization with coronary angiogram N/A 08/01/2012    Procedure: LEFT HEART CATHETERIZATION WITH CORONARY ANGIOGRAM;  Surgeon: MSherren Mocha MD;  [facility];  Service: Cardiovascular;  Laterality: N/A;  . Colonoscopy  09/2014    [facility]  . Colon resection  10/2014    [facility]    Social History   Social History  . Marital Status: Single    Spouse Name: N/A  . Number of Children: N/A  . Years of Education: N/A   Occupational History  . Not on file.   Social History Main Topics  . Smoking status: Current Every Day Smoker -- 0.33 packs/day for 10 years    Types: Cigarettes  . Smokeless tobacco: Never Used  . Alcohol Use: No     Comment: remote EtOh use  . Drug Use: No     Comment: extensive drug use, remotely  . Sexual Activity: Yes     Comment: HYST   Other Topics Concern  . 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 Crohn's disease Daughter     crohn's, ulcerative colitis  . Ulcerative colitis Daughter   . Stroke Mother   . Hypertension Mother   . Hyperlipidemia Mother   . Hypertension Father   . Crohn's disease Father   . Crohn's disease Sister   . Cancer Sister     ovarian  . Crohn's disease Paternal Aunt   . Crohn's disease Paternal Uncle   . Diabetes Neg Hx     Allergies  Allergen Reactions  . Infliximab Dermatitis  . Codeine Nausea And Vomiting  . Flagyl [Metronidazole] Nausea And Vomiting  . Hydrocodone Nausea And Vomiting  . Ibuprofen Other (See Comments)    Pt states that she is  unable to take due to her Crohn's disease.  .Marland KitchenRemeron [Mirtazapine] Other (See Comments)    Reaction:  Makes pt lethargic     Medication [place] and [place]  ROS: GEN: Acute illness details above GI: Tolerating PO intake GU: maintaining adequate hydration and urination Pulm: No SOB Interactive and getting along well at home.  Otherwise, ROS is as per the HPI.   Objective:   BP  127/80 mmHg  Pulse 63  Temp(Src) 98.2 F (36.8 C) (Oral)  Ht 5' 1"  (1.549 m)  Wt 165 lb 4 oz (74.957 kg)  BMI 31.24 kg/m2   GEN: WDWN, NAD, Non-toxic, A & O x 3 HEENT: Atraumatic, Normocephalic. Neck supple. No masses, No LAD. Ears and Nose:  Right earlobe is fairly swollen inferiorly, reddened, and warm to touch. There also is some posterior auricular nodes. TM on the right is bulging, and the bony landmarks cannot be seen. TM on the left is clear CV: RRR, No M/G/R. No JVD. No thrill. No extra heart sounds. PULM: CTA B, no wheezes, crackles, rhonchi. No retractions. No resp. distress. No accessory muscle use. EXTR: No c/c/e NEURO Normal gait.  PSYCH: Normally interactive. Conversant. Not depressed or anxious appearing.  Calm demeanor.     Laboratory and Imaging Data:  Assessment and Plan:   Cellulitis of ear, right  Acute mucoid otitis media of right ear  rx both  Follow-up: No Follow-up on file.  New Prescriptions   AMOXICILLIN-CLAVULANATE (AUGMENTIN) 875-125 MG TABLET    Take 1 tablet by mouth 2 (two) times daily.   Signed,  Maud Deed. Jatziri Goffredo, MD   Patient's Medications  New Prescriptions   AMOXICILLIN-CLAVULANATE (AUGMENTIN) 875-125 MG TABLET    Take 1 tablet by mouth 2 (two) times daily.  Previous Medications   ACETAMINOPHEN (TYLENOL) 500 MG TABLET    Take 1,000 mg by mouth every 6 (six) hours as needed for mild pain or headache.    AMITRIPTYLINE (ELAVIL) 50 MG TABLET    Take 1.5 tablets (75 mg total) by mouth at bedtime.   ASPIRIN EC 81 MG  TABLET    Take 81 mg by mouth daily.   CITALOPRAM (CELEXA) 40 MG TABLET    Take 1 tablet (40 mg total) by mouth daily.   CLONAZEPAM (KLONOPIN) 0.5 MG TABLET    Take 0.5 tablets (0.25 mg total) by mouth 3 (three) times daily as needed for anxiety.   CYANOCOBALAMIN (,VITAMIN B-12,) 1000 MCG/ML INJECTION    Inject 1,000 mcg into the muscle every 30 (thirty) days.   DIPHENHYDRAMINE (BENADRYL) 25 MG TABLET    Take 25 mg by mouth every 6 (six) hours as needed for allergies.   METOPROLOL (LOPRESSOR) 50 MG TABLET    Take 1 tablet (50 mg total) by mouth 2 (two) times daily.   PANTOPRAZOLE (PROTONIX) 20 MG TABLET    Take 1 tablet (20 mg total) by mouth daily.   PROMETHAZINE (PHENERGAN) 25 MG TABLET    Take 1 tablet (25 mg total) by mouth every 6 (six) hours as needed for nausea or vomiting.   VEDOLIZUMAB (ENTYVIO) 300 MG INJECTION    Inject into the vein every 8 (eight) weeks.    ZOLPIDEM (AMBIEN) 5 MG TABLET    Take 5 mg by mouth at bedtime as needed for sleep. Reported on 11/09/2015  Modified Medications   No medications on file  Discontinued Medications   No medications on file

## 2015-12-10 NOTE — Progress Notes (Signed)
Pre visit review using our clinic review tool, if applicable. No additional management support is needed unless otherwise documented below in the visit note. 

## 2015-12-27 ENCOUNTER — Emergency Department
Admission: EM | Admit: 2015-12-27 | Discharge: 2015-12-28 | Disposition: A | Discharge status: Home / Self Care | Payer: Self-pay | Attending: Emergency Medicine | Admitting: Emergency Medicine

## 2015-12-27 DIAGNOSIS — I10 Essential (primary) hypertension: Secondary | ICD-10-CM | POA: Insufficient documentation

## 2015-12-27 DIAGNOSIS — E785 Hyperlipidemia, unspecified: Secondary | ICD-10-CM | POA: Insufficient documentation

## 2015-12-27 DIAGNOSIS — Z7982 Long term (current) use of aspirin: Secondary | ICD-10-CM | POA: Insufficient documentation

## 2015-12-27 DIAGNOSIS — Z8673 Personal history of transient ischemic attack (TIA), and cerebral infarction without residual deficits: Secondary | ICD-10-CM | POA: Insufficient documentation

## 2015-12-27 DIAGNOSIS — F329 Major depressive disorder, single episode, unspecified: Secondary | ICD-10-CM | POA: Insufficient documentation

## 2015-12-27 DIAGNOSIS — F17210 Nicotine dependence, cigarettes, uncomplicated: Secondary | ICD-10-CM | POA: Insufficient documentation

## 2015-12-27 DIAGNOSIS — R197 Diarrhea, unspecified: Secondary | ICD-10-CM | POA: Insufficient documentation

## 2015-12-27 DIAGNOSIS — M1990 Unspecified osteoarthritis, unspecified site: Secondary | ICD-10-CM | POA: Insufficient documentation

## 2015-12-27 DIAGNOSIS — R1111 Vomiting without nausea: Secondary | ICD-10-CM | POA: Insufficient documentation

## 2015-12-27 DIAGNOSIS — R1110 Vomiting, unspecified: Secondary | ICD-10-CM

## 2015-12-27 DIAGNOSIS — R109 Unspecified abdominal pain: Secondary | ICD-10-CM | POA: Insufficient documentation

## 2015-12-27 LAB — URINALYSIS COMPLETE WITH MICROSCOPIC (ARMC ONLY)
BACTERIA UA: NONE SEEN
Bilirubin Urine: NEGATIVE
Glucose, UA: NEGATIVE mg/dL
Ketones, ur: NEGATIVE mg/dL
Nitrite: NEGATIVE
PH: 6 (ref 5.0–8.0)
PROTEIN: NEGATIVE mg/dL
SPECIFIC GRAVITY, URINE: 1.015 (ref 1.005–1.030)

## 2015-12-27 LAB — COMPREHENSIVE METABOLIC PANEL
ALT: 29 U/L (ref 14–54)
AST: 32 U/L (ref 15–41)
Albumin: 3.9 g/dL (ref 3.5–5.0)
Alkaline Phosphatase: 105 U/L (ref 38–126)
Anion gap: 11 (ref 5–15)
BUN: 8 mg/dL (ref 6–20)
CHLORIDE: 108 mmol/L (ref 101–111)
CO2: 23 mmol/L (ref 22–32)
CREATININE: 0.74 mg/dL (ref 0.44–1.00)
Calcium: 9.1 mg/dL (ref 8.9–10.3)
GFR calc Af Amer: >60 mL/min (ref >60)
Glucose, Bld: 121 mg/dL — ABNORMAL HIGH (ref 65–99)
POTASSIUM: 3.5 mmol/L (ref 3.5–5.1)
SODIUM: 142 mmol/L (ref 135–145)
Total Bilirubin: 0.4 mg/dL (ref 0.3–1.2)
Total Protein: 7.2 g/dL (ref 6.5–8.1)

## 2015-12-27 LAB — SEDIMENTATION RATE: SED RATE: 18 mm/h (ref 0–30)

## 2015-12-27 LAB — CBC
HEMATOCRIT: 36.1 % (ref 35.0–47.0)
Hemoglobin: 12.1 g/dL (ref 12.0–16.0)
MCH: 30.9 pg (ref 26.0–34.0)
MCHC: 33.5 g/dL (ref 32.0–36.0)
MCV: 92.4 fL (ref 80.0–100.0)
Platelets: 257 10*3/uL (ref 150–440)
RBC: 3.91 MIL/uL (ref 3.80–5.20)
RDW: 13.8 % (ref 11.5–14.5)
WBC: 13.3 10*3/uL — AB (ref 3.6–11.0)

## 2015-12-27 LAB — LIPASE, BLOOD: LIPASE: 22 U/L (ref 11–51)

## 2015-12-27 MED ORDER — SODIUM CHLORIDE 0.9 % IV BOLUS (SEPSIS)
1000.0000 mL | Freq: Once | INTRAVENOUS | Status: AC
  Administered 2015-12-27: 1000 mL via INTRAVENOUS

## 2015-12-27 MED ORDER — DIATRIZOATE MEGLUMINE & SODIUM 66-10 % PO SOLN
15.0000 mL | ORAL | Status: AC
  Administered 2015-12-27 (×2): 15 mL via ORAL
  Filled 2015-12-27: qty 30

## 2015-12-27 MED ORDER — ONDANSETRON HCL 4 MG/2ML IJ SOLN
4.0000 mg | Freq: Once | INTRAMUSCULAR | Status: AC
  Administered 2015-12-27: 4 mg via INTRAVENOUS
  Filled 2015-12-27: qty 2

## 2015-12-27 MED ORDER — HYDROMORPHONE HCL 1 MG/ML IJ SOLN
0.5000 mg | INTRAMUSCULAR | Status: AC
  Administered 2015-12-27: 0.5 mg via INTRAVENOUS
  Filled 2015-12-27: qty 1

## 2015-12-27 NOTE — ED Provider Notes (Signed)
[facility] Emergency Department Provider Note  ____________________________________________  Time seen: Approximately 9:14 PM  I have reviewed the triage vital signs and the nursing notes.   HISTORY  Chief Complaint Abdominal Pain    HPI Sharon [name] is a 60 y.o. female history of Crohn's disease including prior resection in which she reports is a previous bowel resection due to adhesions.  Currently on immune modulating medication.  Patient reports that for about the last 2 days she's been having frequent nausea, crampy abdominal pain and diarrhea up to 10+ loose bowel movements a day which she describes as nonbloody. She does also report recently being on amoxicillin 3 weeks ago for an ear infection which is improved. She vomited a couple times this evening and reports having moderate to severe crampy abdominal pain which comes and goes. She denies fevers or chills. No chest pain or trouble breathing. No urinary symptoms. States that the pain moves from one place to the other, is crampy and intermittent in nature.  She does reports she's had somewhat similar symptoms with Crohn's flares in the past, but states it's unclear to her as she's not seeing a lot of blood or anything in her stool.  No recent trips or travel. No bad food exposures. No history of C. difficile in the past. Her daughter has had a history of C. difficile.   Past Medical History  Diagnosis Date  . HTN (hypertension)   . Migraines     and frequent other headaches (sinus or stress)  . Crohn's disease (Sharon [name]) 2000    h/o stenotic crohn's, active in TI s/p resection 2000, PPD neg ([facility] Dr. [name]); has been on cimzia, remicade, 6MP, entocort, now stable on Entyvio and 6MP ([facility] at [place])  . Cervical cancer ([place]) 12/2008    s/p hysterectomy  . History of chicken pox   . Anxiety and depression   . History of anemia     attributed to crohn's  . Arthritis   . Genital warts    . History of syphilis 1980s  . Tobacco abuse   . TIA (transient ischemic attack) 05/2010    at [facility] - TIA vs complex migraine (w/u negative - carotids, echo, TC doppler, and MRI normal)  . Hepatic steatosis 04/2011    diffuse on CT, 50m gallbladder polyp  . B12 deficiency     pernicious anemia - monthly b12 shots  . Scalp psoriasis   . GERD (gastroesophageal reflux disease)     severe, daily sxs if off PPI  . Takotsubo syndrome 07/2012    cath 08/01/12, normal coronaries, LVEF 65%  . Sensorineural hearing loss of both ears 12/2012    high freq    Patient Active Problem List   Diagnosis Date Noted  . Hyperglycemia 05/09/2013  . HLD (hyperlipidemia) 02/12/2013  . Diarrhea 12/18/2012  . Right knee pain 11/13/2012  . Takotsubo syndrome   . Chest pain 08/02/2012  . Bilateral hearing loss 06/27/2012  . GERD (gastroesophageal reflux disease)   . Skin rash 04/27/2012  . Right shoulder pain 03/13/2012  . History of pneumonia 01/18/2012  . Headache(784.0) 10/13/2011  . Healthcare maintenance 08/16/2011  . Leukopenia 08/16/2011  . Vitamin B12 deficiency   . HTN (hypertension)   . Migraines   . [place]'s disease ([place]   . Anxiety and depression   . Smoker   . TIA (transient ischemic attack)   . Hepatic steatosis 04/30/2011    Past Surgical History  Procedure Laterality  Date  . Appendectomy  1998  . Right colectomy  2000    crohn's disease, ileocecal resection  . Hospitalization  05/2010    TIA vs complex migraine - w/u negative - head CT, MRI/MRA, carotid dopplers, echo.  treated with ASA and tramadol  . US echocardiography  05/2010    nl LV fxn ,EF 60%  . Abd [place]  04/2011    diffuse hepatic steatosis, 62m polyp in gallbaldder fundus, no stones, mod abd ath  . Colonoscopy  10/2008    ileo-colonic anastomosis, ielocolonic crohn's  . Colonoscopy  05/2011    ileo-colonic anastomosis normal, normal mucosa  . Esophagogastroduodenoscopy  05/2011    nl esophagus, gastritis, nl  duodenum  . Cervical biopsy  w/ loop electrode excision  10/2008  . Vaginal hysterectomy  12/2008    [facility]  . Cardiac catheterization  07/2012    normal LV fxn, widely patent coronaries  . Colonoscopy  10/15/2012    focal inflammation at anastomosis, no active crohn's, planning MR enterograph ([name]  . Shoulder arthroscopy w/ rotator cuff repair Right 05/31/2012    [facility] (Tamera Punt  . Left heart catheterization with coronary angiogram N/A 08/01/2012    Procedure: LEFT HEART CATHETERIZATION WITH CORONARY ANGIOGRAM;  Surgeon: MSherren Mocha MD;  [facility];  Service: Cardiovascular;  Laterality: N/A;  . Colonoscopy  09/2014    [facility]  . Colon resection  10/2014    [facility]    Current Outpatient Rx  Name  Route  Sig  Dispense  Refill  . acetaminophen (TYLENOL) 500 MG tablet   Oral   Take 1,000 mg by mouth every 6 (six) hours as needed for mild pain or headache.          .Marland Kitchenamitriptyline (ELAVIL) 50 MG tablet   Oral   Take 1.5 tablets (75 mg total) by mouth at bedtime.   45 tablet   6   . aspirin EC 81 MG tablet   Oral   Take 81 mg by mouth daily.         . citalopram (CELEXA) 40 MG tablet   Oral   Take 1 tablet (40 mg total) by mouth daily.   90 tablet   3   . clonazePAM (KLONOPIN) 0.5 MG tablet   Oral   Take 0.5 tablets (0.25 mg total) by mouth 3 (three) times daily as needed for anxiety.         . cyanocobalamin (,VITAMIN B-12,) 1000 MCG/ML injection   Intramuscular   Inject 1,000 mcg into the muscle every 30 (thirty) days.         . diphenhydrAMINE (BENADRYL) 25 MG tablet   Oral   Take 25 mg by mouth every 6 (six) hours as needed for allergies.         . metoprolol (LOPRESSOR) 50 MG tablet   Oral   Take 1 tablet (50 mg total) by mouth 2 (two) times daily.   60 tablet   11   . pantoprazole (PROTONIX) 20 MG tablet   Oral   Take 1 tablet (20 mg total) by mouth daily.   30  tablet   11   . vedolizumab (ENTYVIO) 300 MG injection   Intravenous   Inject into the vein every 8 (eight) weeks.          .Marland Kitchenzolpidem (AMBIEN) 5 MG tablet   Oral   Take 5 mg by mouth at bedtime  as needed for sleep. Reported on 11/09/2015         . amoxicillin-clavulanate (AUGMENTIN) 875-125 MG tablet   Oral   Take 1 tablet by mouth 2 (two) times daily. Patient not taking: Reported on 12/27/2015   20 tablet   0     Allergies Infliximab; Codeine; Flagyl; Hydrocodone; Ibuprofen; and Remeron  Family History  Problem Relation Age of Onset  . Crohn's disease Daughter     crohn's, ulcerative colitis  . Ulcerative colitis Daughter   . Stroke Mother   . Hypertension Mother   . Hyperlipidemia Mother   . Hypertension Father   . Crohn's disease Father   . Crohn's disease Sister   . Cancer Sister     ovarian  . Crohn's disease Paternal Aunt   . Crohn's disease Paternal Uncle   . Diabetes Neg Hx     Social History Social History  Substance Use Topics  . Smoking status: Current Every Day Smoker -- 0.33 packs/day for 10 years    Types: Cigarettes  . Smokeless tobacco: Never Used  . Alcohol Use: No     Comment: remote EtOh use    Review of Systems Constitutional: No fever/chills Eyes: No visual changes. ENT: No sore throat.No lesions in the mouth. Cardiovascular: Denies chest pain. Respiratory: Denies shortness of breath. Gastrointestinal: See history of present illness  No constipation. Genitourinary: Negative for dysuria. Musculoskeletal: Negative for back pain though at times when the pain is severe with cramps it does seem to radiate somewhat to her lower back. No vaginal discharge or "pelvic" discomfort. Skin: Negative for rash. Neurological: Negative for headaches, focal weakness or numbness.  10-point ROS otherwise negative.  ____________________________________________   PHYSICAL EXAM:  VITAL SIGNS: ED Triage Vitals  Enc Vitals Group     BP 12/27/15  1916 135/74 mmHg     Pulse Rate 12/27/15 1916 83     Resp 12/27/15 1916 20     Temp 12/27/15 1916 98.9 F (37.2 C)     Temp Source 12/27/15 1916 Oral     SpO2 12/27/15 1916 96 %     Weight 12/27/15 1916 164 lb (74.39 kg)     Height 12/27/15 1916 5' 1"  (1.549 m)     Head Cir --      Peak Flow --      Pain Score 12/27/15 1917 8     Pain Loc --      Pain Edu? --      Excl. in [place]? --    Constitutional: Alert and oriented. Well appearing and in no acute distress.Pleasant. Eyes: Conjunctivae are normal. PERRL. EOMI. Head: Atraumatic. Nose: No congestion/rhinnorhea. Mouth/Throat: Mucous membranes are moist.  Oropharynx non-erythematous. No oral lesions Neck: No stridor.   Cardiovascular: Normal rate, regular rhythm. Grossly normal heart sounds.  Good peripheral circulation. Respiratory: Normal respiratory effort.  No retractions. Lungs CTAB. Gastrointestinal: Soft but moderately tender throughout without rebound or frank peritonitis. No ascites. No clear focality of her abdominal discomfort, but she reports moderate discomfort throughout.. No distention. No abdominal bruits. No CVA tenderness. Musculoskeletal: No lower extremity tenderness nor edema.  No joint effusions. Neurologic:  Normal speech and language. No gross focal neurologic deficits are appreciated. Skin:  Skin is warm, dry and intact. No rash noted. Psychiatric: Mood and affect are normal. Speech and behavior are normal.  ____________________________________________   LABS (all labs ordered are listed, but only abnormal results are displayed)  Labs Reviewed  COMPREHENSIVE METABOLIC PANEL - Abnormal; Notable  for the following:    Glucose, Bld 121 (*)    All other components within normal limits  CBC - Abnormal; Notable for the following:    WBC 13.3 (*)    All other components within normal limits  URINALYSIS COMPLETEWITH MICROSCOPIC ([facility] ONLY) - Abnormal; Notable for the following:    Color, Urine YELLOW (*)     APPearance HAZY (*)    Hgb urine dipstick 2+ (*)    Leukocytes, UA TRACE (*)    Squamous Epithelial / LPF 0-5 (*)    All other components within normal limits  C DIFFICILE QUICK SCREEN W PCR REFLEX  GASTROINTESTINAL PANEL BY PCR, STOOL (REPLACES STOOL CULTURE)  LIPASE, BLOOD  SEDIMENTATION RATE   ____________________________________________  EKG  No cardiac or pulmonary symptoms ____________________________________________  RADIOLOGY  CT pending at sign out ____________________________________________   PROCEDURES  Procedure(s) performed: None  Critical Care performed: No  ____________________________________________   INITIAL IMPRESSION / ASSESSMENT AND PLAN / ED COURSE  Pertinent labs & imaging results that were available during my care of the patient were reviewed by me and considered in my medical decision making (see chart for details).  Patient with a long-standing history of Crohn's, now on entyvio, which she reports is controlled her symptoms well. Presents today with 2 days of frequent loose stools. This is in the setting of recent antibiotic use, and certainly differential includes infectious etiologies, viral, gastroenteritis, Crohn's flare, or other concerns given her complicated surgical history such as bowel obstruction.  Differential diagnosis includes but is not limited to, abdominal perforation, aortic dissection, cholecystitis, appendicitis, diverticulitis, colitis, esophagitis/gastritis, kidney stone, pyelonephritis, urinary tract infection, aortic aneurysm. All are considered in decision and treatment plan. Based upon the patient's presentation and risk factors, I we will obtain CT imaging, have ordered stool cultures including C. difficile, we'll hydrate her here and control her pain. ESR sent to evaluate for inflammatory markers as well.  ----------------------------------------- 12:11 AM on  12/28/2015 -----------------------------------------  Patient reporting some improvement. No further emesis. Has not yet been able to produce a stool.  Ongoing care assigned to Dr. Beather Arbour.  Follow-up CT abdomen and pelvis Follow-up reevaluation Follow-up C. difficile and stool studies  ____________________________________________   FINAL CLINICAL IMPRESSION(S) / ED DIAGNOSES  Final diagnoses:  Abdominal pain, vomiting, and diarrhea      Delman Kitten, MD 12/28/15 0013

## 2015-12-27 NOTE — ED Notes (Signed)
Checked to see if pt could provide stool sample yet. Pt had just gotten up to try without success. Will continue to check back.

## 2015-12-27 NOTE — ED Notes (Signed)
Pt instructed that we need stool sample. Hat placed in toilet to catch stool. Pt instructed to call when she needs to get up since she has gotten narcotic pain medication. Pt verbalized understanding. Will continue to monitor pt.

## 2015-12-27 NOTE — ED Notes (Signed)
Patient reports abdominal pain, diarrhea, nausea vomiting, headache and back pain.

## 2015-12-28 ENCOUNTER — Other Ambulatory Visit
Admission: RE | Admit: 2015-12-28 | Discharge: 2015-12-28 | Disposition: A | Discharge status: Home / Self Care | Payer: Self-pay | Source: Ambulatory Visit | Attending: Emergency Medicine | Admitting: Emergency Medicine

## 2015-12-28 ENCOUNTER — Emergency Department: Payer: Self-pay

## 2015-12-28 LAB — GASTROINTESTINAL PANEL BY PCR, STOOL (REPLACES STOOL CULTURE)
ADENOVIRUS F40/41: NOT DETECTED
Astrovirus: NOT DETECTED
CAMPYLOBACTER SPECIES: NOT DETECTED
Cryptosporidium: NOT DETECTED
Cyclospora cayetanensis: NOT DETECTED
E. coli O157: NOT DETECTED
ENTEROAGGREGATIVE E COLI (EAEC): NOT DETECTED
ENTEROPATHOGENIC E COLI (EPEC): NOT DETECTED
ENTEROTOXIGENIC E COLI (ETEC): NOT DETECTED
Entamoeba histolytica: NOT DETECTED
GIARDIA LAMBLIA: NOT DETECTED
NOROVIRUS GI/GII: NOT DETECTED
PLESIMONAS SHIGELLOIDES: NOT DETECTED
ROTAVIRUS A: NOT DETECTED
SHIGA LIKE TOXIN PRODUCING E COLI (STEC): NOT DETECTED
Salmonella species: NOT DETECTED
Sapovirus (I, II, IV, and V): NOT DETECTED
Shigella/Enteroinvasive E coli (EIEC): NOT DETECTED
Vibrio cholerae: NOT DETECTED
Vibrio species: NOT DETECTED
Yersinia enterocolitica: NOT DETECTED

## 2015-12-28 LAB — C DIFFICILE QUICK SCREEN W PCR REFLEX
C DIFFICILE (CDIFF) INTERP: NEGATIVE
C DIFFICLE (CDIFF) ANTIGEN: NEGATIVE
C Diff toxin: NEGATIVE

## 2015-12-28 MED ORDER — HYDROMORPHONE HCL 2 MG PO TABS: 2.0000 mg | ORAL_TABLET | Freq: Four times a day (QID) | ORAL | Status: DC | PRN

## 2015-12-28 MED ORDER — IOPAMIDOL (ISOVUE-300) INJECTION 61%
100.0000 mL | Freq: Once | INTRAVENOUS | Status: AC | PRN
  Administered 2015-12-28: 100 mL via INTRAVENOUS

## 2015-12-28 MED ORDER — ONDANSETRON 4 MG PO TBDP: 4.0000 mg | ORAL_TABLET | Freq: Three times a day (TID) | ORAL | Status: DC | PRN

## 2015-12-28 NOTE — ED Provider Notes (Signed)
-----------------------------------------   3:25 AM on 12/28/2015 -----------------------------------------  CT abdomen and pelvis with contrast interpreted per Dr. Gerilyn Nestle: No evidence of bowel obstruction or inflammation. There is mild stranding in the mesenteric and left lower quadrant fat likely representing reactive change. No abscess. Diffuse fatty infiltration of the liver.  Patient has been sleeping in no acute distress. No further emesis. She has not been able to produce a stool specimen. Updated patient of CT results. We will send patient home with a stool collection kit to return to outpatient laboratory for analysis should she be able to produce a stool sample. Patient tells me that Dilaudid is the only pain medicine that controls her pain. I will write her a very limited prescription and she will follow-up with her PCP early this week. Strict return precautions given. Patient verbalizes understanding and agrees with plan of care.  ----------------------------------------- 8:17 AM on 12/28/2015 -----------------------------------------  Addendum: Patient was able to produce stool sample prior to discharge. I have reviewed those results which are negative for C. difficile and negative GI stool panel by PCR.  Paulette Blanch, MD 12/28/15 [id number]

## 2015-12-28 NOTE — Discharge Instructions (Signed)
1. You may take pain and nausea medicines as needed (Dilaudid #10/Zofran #20). 2. BRAT diet 3 days, then slowly advance diet as tolerated. 3. You may return your loose stool sample to outpatient laboratory if you are able to produce a specimen. 4. Return to the ER for worsening symptoms, persistent vomiting, fever, difficulty breathing or other concerns.  Abdominal Pain, Adult Many things can cause abdominal pain. Usually, abdominal pain is not caused by a disease and will improve without treatment. It can often be observed and treated at home. Your health care provider will do a physical exam and possibly order blood tests and X-rays to help determine the seriousness of your pain. However, in many cases, more time must pass before a clear cause of the pain can be found. Before that point, your health care provider may not know if you need more testing or further treatment. HOME CARE INSTRUCTIONS Monitor your abdominal pain for any changes. The following actions may help to alleviate any discomfort you are experiencing:  Only take over-the-counter or prescription medicines as directed by your health care provider.  Do not take laxatives unless directed to do so by your health care provider.  Try a clear liquid diet (broth, tea, or water) as directed by your health care provider. Slowly move to a bland diet as tolerated. SEEK MEDICAL CARE IF:  You have unexplained abdominal pain.  You have abdominal pain associated with nausea or diarrhea.  You have pain when you urinate or have a bowel movement.  You experience abdominal pain that wakes you in the night.  You have abdominal pain that is worsened or improved by eating food.  You have abdominal pain that is worsened with eating fatty foods.  You have a fever. SEEK IMMEDIATE MEDICAL CARE IF:  Your pain does not go away within 2 hours.  You keep throwing up (vomiting).  Your pain is felt only in portions of the abdomen, such as the  right side or the left lower portion of the abdomen.  You pass bloody or black tarry stools. MAKE SURE YOU:  Understand these instructions.  Will watch your condition.  Will get help right away if you are not doing well or get worse.   This information is not intended to replace advice given to you by your health care provider. Make sure you discuss any questions you have with your health care provider.   Document Released: 05/25/2005 Document Revised: 05/06/2015 Document Reviewed: 04/24/2013 Elsevier Interactive Patient Education 2016 [place].  Diarrhea Diarrhea is frequent loose and watery bowel movements. It can cause you to feel weak and dehydrated. Dehydration can cause you to become tired and thirsty, have a dry mouth, and have decreased urination that often is dark yellow. Diarrhea is a sign of another problem, most often an infection that will not last long. In most cases, diarrhea typically lasts 2-3 days. However, it can last longer if it is a sign of something more serious. It is important to treat your diarrhea as directed by your caregiver to lessen or prevent future episodes of diarrhea. CAUSES  Some common causes include:  Gastrointestinal infections caused by viruses, bacteria, or parasites.  Food poisoning or food allergies.  Certain medicines, such as antibiotics, chemotherapy, and laxatives.  Artificial sweeteners and fructose.  Digestive disorders. HOME CARE INSTRUCTIONS  Ensure adequate fluid intake (hydration): Have 1 cup (8 oz) of fluid for each diarrhea episode. Avoid fluids that contain simple sugars or sports drinks, fruit juices, whole  milk products, and sodas. Your urine should be clear or pale yellow if you are drinking enough fluids. Hydrate with an oral rehydration solution that you can purchase at pharmacies, retail stores, and online. You can prepare an oral rehydration solution at home by mixing the following ingredients together:   - tsp  table salt.   tsp baking soda.   tsp salt substitute containing potassium chloride.  1  tablespoons sugar.  1 L (34 oz) of water.  Certain foods and beverages may increase the speed at which food moves through the gastrointestinal (GI) tract. These foods and beverages should be avoided and include:  Caffeinated and alcoholic beverages.  High-fiber foods, such as raw fruits and vegetables, nuts, seeds, and whole grain breads and cereals.  Foods and beverages sweetened with sugar alcohols, such as xylitol, sorbitol, and mannitol.  Some foods may be well tolerated and may help thicken stool including:  Starchy foods, such as rice, toast, pasta, low-sugar cereal, oatmeal, grits, baked potatoes, crackers, and bagels.  Bananas.  Applesauce.  Add probiotic-rich foods to help increase healthy bacteria in the GI tract, such as yogurt and fermented milk products.  Wash your hands well after each diarrhea episode.  Only take over-the-counter or prescription medicines as directed by your caregiver.  Take a warm bath to relieve any burning or pain from frequent diarrhea episodes. SEEK IMMEDIATE MEDICAL CARE IF:   You are unable to keep fluids down.  You have persistent vomiting.  You have blood in your stool, or your stools are black and tarry.  You do not urinate in 6-8 hours, or there is only a small amount of very dark urine.  You have abdominal pain that increases or localizes.  You have weakness, dizziness, confusion, or light-headedness.  You have a severe headache.  Your diarrhea gets worse or does not get better.  You have a fever or persistent symptoms for more than 2-3 days.  You have a fever and your symptoms suddenly get worse. MAKE SURE YOU:   Understand these instructions.  Will watch your condition.  Will get help right away if you are not doing well or get worse.   This information is not intended to replace advice given to you by your health care  provider. Make sure you discuss any questions you have with your health care provider.   Document Released: 08/05/2002 Document Revised: 09/05/2014 Document Reviewed: 04/22/2012 Elsevier Interactive Patient Education 2016 [place] Choices to Help Relieve Diarrhea, Adult When you have diarrhea, the foods you eat and your eating habits are very important. Choosing the right foods and drinks can help relieve diarrhea. Also, because diarrhea can last up to 7 days, you need to replace lost fluids and electrolytes (such as sodium, potassium, and chloride) in order to help prevent dehydration.  WHAT GENERAL GUIDELINES DO I NEED TO FOLLOW?  Slowly drink 1 cup (8 oz) of fluid for each episode of diarrhea. If you are getting enough fluid, your urine will be clear or pale yellow.  Eat starchy foods. Some good choices include white rice, white toast, pasta, low-fiber cereal, baked potatoes (without the skin), saltine crackers, and bagels.  Avoid large servings of any cooked vegetables.  Limit fruit to two servings per day. A serving is  cup or 1 small piece.  Choose foods with less than 2 g of fiber per serving.  Limit fats to less than 8 tsp (38 g) per day.  Avoid fried foods.  Eat foods  that have probiotics in them. Probiotics can be found in certain dairy products.  Avoid foods and beverages that may increase the speed at which food moves through the stomach and intestines (gastrointestinal tract). Things to avoid include:  High-fiber foods, such as dried fruit, raw fruits and vegetables, nuts, seeds, and whole grain foods.  Spicy foods and high-fat foods.  Foods and beverages sweetened with high-fructose corn syrup, honey, or sugar alcohols such as xylitol, sorbitol, and mannitol. WHAT FOODS ARE RECOMMENDED? Grains White rice. White, [place], or pita breads (fresh or toasted), including plain rolls, buns, or bagels. White pasta. Saltine, soda, or graham crackers. Pretzels.  Low-fiber cereal. Cooked cereals made with water (such as cornmeal, farina, or cream cereals). Plain muffins. Matzo. Melba toast. Zwieback.  Vegetables Potatoes (without the skin). Strained tomato and vegetable juices. Most well-cooked and canned vegetables without seeds. Tender lettuce. Fruits Cooked or canned applesauce, apricots, cherries, fruit cocktail, grapefruit, peaches, pears, or plums. Fresh bananas, apples without skin, cherries, grapes, cantaloupe, grapefruit, peaches, oranges, or plums.  Meat and Other Protein Products Baked or boiled chicken. Eggs. Tofu. Fish. Seafood. Smooth peanut butter. Ground or well-cooked tender beef, ham, veal, lamb, pork, or poultry.  Dairy Plain yogurt, kefir, and unsweetened liquid yogurt. Lactose-free milk, buttermilk, or soy milk. Plain hard cheese. Beverages Sport drinks. Clear broths. Diluted fruit juices (except prune). Regular, caffeine-free sodas such as ginger ale. Water. Decaffeinated teas. Oral rehydration solutions. Sugar-free beverages not sweetened with sugar alcohols. Other Bouillon, broth, or soups made from recommended foods.  The items listed above may not be a complete list of recommended foods or beverages. Contact your dietitian for more options. WHAT FOODS ARE NOT RECOMMENDED? Grains Whole grain, whole wheat, bran, or rye breads, rolls, pastas, crackers, and cereals. Wild or brown rice. Cereals that contain more than 2 g of fiber per serving. Corn tortillas or taco shells. Cooked or dry oatmeal. Granola. Popcorn. Vegetables Raw vegetables. Cabbage, broccoli, Brussels sprouts, artichokes, baked beans, beet greens, corn, kale, legumes, peas, sweet potatoes, and yams. Potato skins. Cooked spinach and cabbage. Fruits Dried fruit, including raisins and dates. Raw fruits. Stewed or dried prunes. Fresh apples with skin, apricots, mangoes, pears, raspberries, and strawberries.  Meat and Other Protein Products Chunky peanut butter. Nuts and  seeds. Beans and lentils. Berniece Salines.  Dairy High-fat cheeses. Milk, chocolate milk, and beverages made with milk, such as milk shakes. Cream. Ice cream. Sweets and Desserts Sweet rolls, doughnuts, and sweet breads. Pancakes and waffles. Fats and Oils Butter. Cream sauces. Margarine. Salad oils. Plain salad dressings. Olives. Avocados.  Beverages Caffeinated beverages (such as coffee, tea, soda, or energy drinks). Alcoholic beverages. Fruit juices with pulp. Prune juice. Soft drinks sweetened with high-fructose corn syrup or sugar alcohols. Other Coconut. Hot sauce. Chili powder. Mayonnaise. Gravy. Cream-based or milk-based soups.  The items listed above may not be a complete list of foods and beverages to avoid. Contact your dietitian for more information. WHAT SHOULD I DO IF I BECOME DEHYDRATED? Diarrhea can sometimes lead to dehydration. Signs of dehydration include dark urine and dry mouth and skin. If you think you are dehydrated, you should rehydrate with an oral rehydration solution. These solutions can be purchased at pharmacies, retail stores, or online.  Drink -1 cup (120-240 mL) of oral rehydration solution each time you have an episode of diarrhea. If drinking this amount makes your diarrhea worse, try drinking smaller amounts more often. For example, drink 1-3 tsp (5-15 mL) every 5-10 minutes.  A general  rule for staying hydrated is to drink 1-2 L of fluid per day. Talk to your health care provider about the specific amount you should be drinking each day. Drink enough fluids to keep your urine clear or pale yellow.   This information is not intended to replace advice given to you by your health care provider. Make sure you discuss any questions you have with your health care provider.   Document Released: 11/05/2003 Document Revised: 09/05/2014 Document Reviewed: 07/08/2013 Elsevier Interactive Patient Education 2016 Elsevier Inc.  Nausea and Vomiting Nausea is a sick feeling that  often comes before throwing up (vomiting). Vomiting is a reflex where stomach contents come out of your mouth. Vomiting can cause severe loss of body fluids (dehydration). Children and elderly adults can become dehydrated quickly, especially if they also have diarrhea. Nausea and vomiting are symptoms of a condition or disease. It is important to find the cause of your symptoms. CAUSES   Direct irritation of the stomach lining. This irritation can result from increased acid production (gastroesophageal reflux disease), infection, food poisoning, taking certain medicines (such as nonsteroidal anti-inflammatory drugs), alcohol use, or tobacco use.  Signals from the brain.These signals could be caused by a headache, heat exposure, an inner ear disturbance, increased pressure in the brain from injury, infection, a tumor, or a concussion, pain, emotional stimulus, or metabolic problems.  An obstruction in the gastrointestinal tract (bowel obstruction).  Illnesses such as diabetes, hepatitis, gallbladder problems, appendicitis, kidney problems, cancer, sepsis, atypical symptoms of a heart attack, or eating disorders.  Medical treatments such as chemotherapy and radiation.  Receiving medicine that makes you sleep (general anesthetic) during surgery. DIAGNOSIS Your caregiver may ask for tests to be done if the problems do not improve after a few days. Tests may also be done if symptoms are severe or if the reason for the nausea and vomiting is not clear. Tests may include:  Urine tests.  Blood tests.  Stool tests.  Cultures (to look for evidence of infection).  X-rays or other imaging studies. Test results can help your caregiver make decisions about treatment or the need for additional tests. TREATMENT You need to stay well hydrated. Drink frequently but in small amounts.You may wish to drink water, sports drinks, clear broth, or eat frozen ice pops or gelatin dessert to help stay  hydrated.When you eat, eating slowly may help prevent nausea.There are also some antinausea medicines that may help prevent nausea. HOME CARE INSTRUCTIONS   Take all medicine as directed by your caregiver.  If you do not have an appetite, do not force yourself to eat. However, you must continue to drink fluids.  If you have an appetite, eat a normal diet unless your caregiver tells you differently.  Eat a variety of complex carbohydrates (rice, wheat, potatoes, bread), lean meats, yogurt, fruits, and vegetables.  Avoid high-fat foods because they are more difficult to digest.  Drink enough water and fluids to keep your urine clear or pale yellow.  If you are dehydrated, ask your caregiver for specific rehydration instructions. Signs of dehydration may include:  Severe thirst.  Dry lips and mouth.  Dizziness.  Dark urine.  Decreasing urine frequency and amount.  Confusion.  Rapid breathing or pulse. SEEK IMMEDIATE MEDICAL CARE IF:   You have blood or brown flecks (like coffee grounds) in your vomit.  You have black or bloody stools.  You have a severe headache or stiff neck.  You are confused.  You have severe abdominal pain.  You have chest pain or trouble breathing.  You do not urinate at least once every 8 hours.  You develop cold or clammy skin.  You continue to vomit for longer than 24 to 48 hours.  You have a fever. MAKE SURE YOU:   Understand these instructions.  Will watch your condition.  Will get help right away if you are not doing well or get worse.   This information is not intended to replace advice given to you by your health care provider. Make sure you discuss any questions you have with your health care provider.   Document Released: 08/15/2005 Document Revised: 11/07/2011 Document Reviewed: 01/12/2011 Elsevier Interactive Patient Education [employer].

## 2015-12-30 ENCOUNTER — Telehealth: Payer: Self-pay

## 2015-12-30 LAB — WBCS, STOOL: WBCs, Stool: NONE SEEN

## 2015-12-30 NOTE — Telephone Encounter (Signed)
Appt scheduled

## 2015-12-30 NOTE — Telephone Encounter (Signed)
Pt left v/m; pt was seen at [facility] ED on 12/27/15 with abd pain, nausea and diarrhea. Now pt is feeling better. Still has some upper abd pain; pain level now is 3 and still nauseated, last 24 hours had 5 loose stools. No vomiting or fever; pt wanted Dr Darnell Level to review labs and CT scan and get his opinion. Dr Darnell Level said he will review. If pt condition changes or worsens prior to cb pt will call [place].

## 2015-12-30 NOTE — Telephone Encounter (Signed)
Would offer ER f/u appt ok to place in 15 min slot

## 2016-01-01 ENCOUNTER — Encounter: Payer: Self-pay | Admitting: Family Medicine

## 2016-01-01 ENCOUNTER — Ambulatory Visit (INDEPENDENT_AMBULATORY_CARE_PROVIDER_SITE_OTHER): Payer: Self-pay | Admitting: Family Medicine

## 2016-01-01 VITALS — BP 146/82 | HR 62 | Temp 98.2°F | Wt 167.5 lb

## 2016-01-01 DIAGNOSIS — F17200 Nicotine dependence, unspecified, uncomplicated: Secondary | ICD-10-CM

## 2016-01-01 DIAGNOSIS — K50919 Crohn's disease, unspecified, with unspecified complications: Secondary | ICD-10-CM

## 2016-01-01 DIAGNOSIS — R319 Hematuria, unspecified: Secondary | ICD-10-CM | POA: Insufficient documentation

## 2016-01-01 DIAGNOSIS — Z720 Tobacco use: Secondary | ICD-10-CM

## 2016-01-01 LAB — POC URINALSYSI DIPSTICK (AUTOMATED)
Bilirubin, UA: NEGATIVE
Blood, UA: NEGATIVE
GLUCOSE UA: NEGATIVE
Ketones, UA: NEGATIVE
Leukocytes, UA: NEGATIVE
Nitrite, UA: NEGATIVE
Protein, UA: NEGATIVE
SPEC GRAV UA: 1.015
UROBILINOGEN UA: 0.2
pH, UA: 6

## 2016-01-01 NOTE — Progress Notes (Signed)
BP 146/82 mmHg  Pulse 62  Temp(Src) 98.2 F (36.8 C) (Oral)  Wt 167 lb 8 oz (75.978 kg)  SpO2 97%   CC: ER f/u visit  Subjective:    Patient ID: Sharon [name], female    DOB: 04-21-1956, 60 y.o.   MRN: [id number]  HPI: Sharon [name] is a 60 y.o. female presenting on 01/01/2016 for Follow-up   Seen at [facility] ER 12/27/2015 with 2d h/o abd pain, nausea, diarrhea flare of crohn's disease. Started after amoxicillin course for ear infection. CT showed no bowel obstruction or inflammation. Mild stranding LLQ and mesenteric fat likely reactive. Diffuse fatty infiltration of liver. C diff testing negative, GI stool panel PCR negative. WBC 13.3, ESR 18. Records reviewed.    Still with malaise, ongoing nausea and crampy abd pain mainly lower abdomen with bowel movements. Diarrhea somewhat better. Taking zofran for nausea. Denies fevers/chills, dysuria, frequency. + urinary urgency. Denies new foods, new travel. No sick contacts at home or work.   Hematuria found - 6-30 RBC/hpf on UA at ER. Continued smoker - 1/3 ppd.  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 No fmhx bladder cancer. No h/o kidney stones.   Crohn's disease [facility]) Date: 2000 h/o stenotic crohn's, active in TI s/p resection 2000, PPD neg ([facility] Dr. [name]); has been on cimzia, remicade, 6MP, entocort, latest stable on Entyvio ([place] [facility] [place]). Next Entyvio 01/21/2016 (receives infusion at [facility]). She did have colonoscopy in last few months - told stable.  Relevant past medical, surgical, family and social history reviewed and updated as indicated. Interim medical history since our last visit reviewed. Allergies and medications reviewed and updated. Current Outpatient Prescriptions on File Prior to Visit  Medication Sig  . acetaminophen (TYLENOL) 500 MG tablet Take 1,000 mg by mouth every 6 (six) hours as needed for mild pain or headache.   Marland Kitchen amitriptyline (ELAVIL) 50 MG tablet Take 1.5 tablets (75 mg total) by mouth at bedtime.  Marland Kitchen  aspirin EC 81 MG tablet Take 81 mg by mouth daily.  . citalopram (CELEXA) 40 MG tablet Take 1 tablet (40 mg total) by mouth daily.  . clonazePAM (KLONOPIN) 0.5 MG tablet Take 0.5 tablets (0.25 mg total) by mouth 3 (three) times daily as needed for anxiety.  . cyanocobalamin (,VITAMIN B-12,) 1000 MCG/ML injection Inject 1,000 mcg into the muscle every 30 (thirty) days.  Marland Kitchen HYDROmorphone (DILAUDID) 2 MG tablet Take 1 tablet (2 mg total) by mouth every 6 (six) hours as needed for severe pain.  . metoprolol (LOPRESSOR) 50 MG tablet Take 1 tablet (50 mg total) by mouth 2 (two) times daily.  . ondansetron (ZOFRAN ODT) 4 MG disintegrating tablet Take 1 tablet (4 mg total) by mouth every 8 (eight) hours as needed for nausea or vomiting.  . pantoprazole (PROTONIX) 20 MG tablet Take 1 tablet (20 mg total) by mouth daily.  . vedolizumab (ENTYVIO) 300 MG injection Inject into the vein every 8 (eight) weeks.    No current facility-administered medications on file prior to visit.    Review of Systems Per HPI unless specifically indicated in ROS section     Objective:    BP 146/82 mmHg  Pulse 62  Temp(Src) 98.2 F (36.8 C) (Oral)  Wt 167 lb 8 oz (75.978 kg)  SpO2 97%  Wt Readings from Last 3 Encounters:  01/01/16 167 lb 8 oz (75.978 kg)  12/27/15 164 lb (74.39 kg)  12/10/15 165 lb 4 oz (74.957 kg)    Physical Exam  Constitutional: She appears well-developed and well-nourished. No distress.  HENT:  Mouth/Throat: Oropharynx is clear and moist. No oropharyngeal exudate.  Cardiovascular: Normal rate, regular rhythm, normal heart sounds and intact distal pulses.   No murmur heard. Pulmonary/Chest: Effort normal and breath sounds normal. No respiratory distress. She has no wheezes. She has no rales.  Abdominal: Soft. Normal appearance. She exhibits no distension and no mass. Bowel sounds are increased. There is no hepatosplenomegaly. There is generalized tenderness (mild). There is no rigidity, no  rebound, no guarding, no CVA tenderness and negative Murphy's sign. No hernia.  Musculoskeletal: She exhibits no edema.  Skin: Skin is warm and dry. No rash noted.  Psychiatric: She has a normal mood and affect.  Nursing note and vitals reviewed.  Results for orders placed or performed during the hospital encounter of 12/28/15  WBCs, Stool  Result Value Ref Range   Specimen Description STOOL    Special Requests NONE    WBCs, Stool NO WBC SEEN    Report Status 12/30/2015 FINAL       Assessment & Plan:   Problem List Items Addressed This Visit    Crohn's disease ([place]) - Primary    Anticipate mild flare of crohn's disease. Slowly improving.  Advised continue zofran prn nausea, keep f/u with [facility] 01/13/2016. Pt agrees with plan.      Smoker    Continue to encourage cessation.      Hematuria    Found at recent ER visit. Abd/pelvic CT scan with contrast reassuring last week. Recheck today. If remaining with hematuria, consider uro referral in this continued smoker. Pt agrees with plan.          Follow up plan: No Follow-up on file.  Ria Bush, MD

## 2016-01-01 NOTE — Assessment & Plan Note (Signed)
Continue to encourage cessation.

## 2016-01-01 NOTE — Assessment & Plan Note (Signed)
Anticipate mild flare of crohn's disease. Slowly improving.  Advised continue zofran prn nausea, keep f/u with [facility] 01/13/2016. Pt agrees with plan.

## 2016-01-01 NOTE — Progress Notes (Signed)
Pre visit review using our clinic review tool, if applicable. No additional management support is needed unless otherwise documented below in the visit note. 

## 2016-01-01 NOTE — Patient Instructions (Signed)
ER workup was reassuring. Recheck urinalysis today.  If abnormal we may discuss sending you to urologist.

## 2016-01-01 NOTE — Addendum Note (Signed)
Addended by: Modena Nunnery on: 01/01/2016 12:37 PM   Modules accepted: Orders

## 2016-01-01 NOTE — Assessment & Plan Note (Signed)
Found at recent ER visit. Abd/pelvic CT scan with contrast reassuring last week. Recheck today. If remaining with hematuria, consider uro referral in this continued smoker. Pt agrees with plan.

## 2016-01-26 ENCOUNTER — Other Ambulatory Visit: Payer: Self-pay | Admitting: Family Medicine

## 2016-01-26 NOTE — Telephone Encounter (Signed)
Received refill electronically Last refill 10/29/15 # 60 Last office visit 01/01/16

## 2016-01-26 NOTE — Telephone Encounter (Signed)
Last filled 10-30-15 #60 Last OV 01-01-16 No Future OV

## 2016-01-27 NOTE — Telephone Encounter (Signed)
plz phone in. 

## 2016-01-27 NOTE — Telephone Encounter (Signed)
Rx called in as directed.   

## 2016-03-07 ENCOUNTER — Telehealth: Payer: Self-pay

## 2016-03-07 NOTE — Telephone Encounter (Signed)
Pt needs note for school that Dr Darnell Level is treating pt for anxiety. Pt request cb when note is ready.

## 2016-03-07 NOTE — Telephone Encounter (Signed)
Note written and in Sharon [name].

## 2016-03-08 NOTE — Telephone Encounter (Signed)
Patient notified by telephone that letter is up front ready for pickup.

## 2016-04-30 ENCOUNTER — Emergency Department (HOSPITAL_COMMUNITY): Payer: Self-pay

## 2016-04-30 ENCOUNTER — Encounter (HOSPITAL_COMMUNITY): Payer: Self-pay

## 2016-04-30 ENCOUNTER — Observation Stay (HOSPITAL_COMMUNITY)
Admission: EM | Admit: 2016-04-30 | Discharge: 2016-05-01 | Disposition: A | Discharge status: Home / Self Care | Payer: Self-pay | Attending: Internal Medicine | Admitting: Internal Medicine

## 2016-04-30 ENCOUNTER — Observation Stay (HOSPITAL_COMMUNITY): Payer: Self-pay

## 2016-04-30 DIAGNOSIS — Z87891 Personal history of nicotine dependence: Secondary | ICD-10-CM | POA: Diagnosis present

## 2016-04-30 DIAGNOSIS — F17210 Nicotine dependence, cigarettes, uncomplicated: Secondary | ICD-10-CM | POA: Insufficient documentation

## 2016-04-30 DIAGNOSIS — R0789 Other chest pain: Principal | ICD-10-CM | POA: Insufficient documentation

## 2016-04-30 DIAGNOSIS — Z720 Tobacco use: Secondary | ICD-10-CM

## 2016-04-30 DIAGNOSIS — K210 Gastro-esophageal reflux disease with esophagitis: Secondary | ICD-10-CM

## 2016-04-30 DIAGNOSIS — Z79899 Other long term (current) drug therapy: Secondary | ICD-10-CM | POA: Insufficient documentation

## 2016-04-30 DIAGNOSIS — Z8673 Personal history of transient ischemic attack (TIA), and cerebral infarction without residual deficits: Secondary | ICD-10-CM | POA: Insufficient documentation

## 2016-04-30 DIAGNOSIS — G459 Transient cerebral ischemic attack, unspecified: Secondary | ICD-10-CM | POA: Diagnosis present

## 2016-04-30 DIAGNOSIS — K5090 Crohn's disease, unspecified, without complications: Secondary | ICD-10-CM | POA: Diagnosis present

## 2016-04-30 DIAGNOSIS — R079 Chest pain, unspecified: Secondary | ICD-10-CM | POA: Diagnosis present

## 2016-04-30 DIAGNOSIS — Z8589 Personal history of malignant neoplasm of other organs and systems: Secondary | ICD-10-CM | POA: Insufficient documentation

## 2016-04-30 DIAGNOSIS — K219 Gastro-esophageal reflux disease without esophagitis: Secondary | ICD-10-CM | POA: Diagnosis present

## 2016-04-30 DIAGNOSIS — K50919 Crohn's disease, unspecified, with unspecified complications: Secondary | ICD-10-CM

## 2016-04-30 DIAGNOSIS — I10 Essential (primary) hypertension: Secondary | ICD-10-CM | POA: Diagnosis present

## 2016-04-30 DIAGNOSIS — E785 Hyperlipidemia, unspecified: Secondary | ICD-10-CM | POA: Diagnosis present

## 2016-04-30 LAB — D-DIMER, QUANTITATIVE (NOT AT ARMC): D DIMER QUANT: 0.47 ug{FEU}/mL (ref 0.00–0.50)

## 2016-04-30 LAB — BASIC METABOLIC PANEL
Anion gap: 8 (ref 5–15)
BUN: 5 mg/dL — ABNORMAL LOW (ref 6–20)
CALCIUM: 8.8 mg/dL — AB (ref 8.9–10.3)
CO2: 22 mmol/L (ref 22–32)
CREATININE: 0.83 mg/dL (ref 0.44–1.00)
Chloride: 109 mmol/L (ref 101–111)
Glucose, Bld: 108 mg/dL — ABNORMAL HIGH (ref 65–99)
Potassium: 3.2 mmol/L — ABNORMAL LOW (ref 3.5–5.1)
SODIUM: 139 mmol/L (ref 135–145)

## 2016-04-30 LAB — CBC
HCT: 38.1 % (ref 36.0–46.0)
Hemoglobin: 12.5 g/dL (ref 12.0–15.0)
MCH: 30 pg (ref 26.0–34.0)
MCHC: 32.8 g/dL (ref 30.0–36.0)
MCV: 91.6 fL (ref 78.0–100.0)
PLATELETS: 252 10*3/uL (ref 150–400)
RBC: 4.16 MIL/uL (ref 3.87–5.11)
RDW: 13.7 % (ref 11.5–15.5)
WBC: 7.4 10*3/uL (ref 4.0–10.5)

## 2016-04-30 LAB — TROPONIN I: Troponin I: <0.03 ng/mL (ref <0.03)

## 2016-04-30 LAB — I-STAT TROPONIN, ED: TROPONIN I, POC: 0 ng/mL (ref 0.00–0.08)

## 2016-04-30 MED ORDER — RISAQUAD PO CAPS
1.0000 | ORAL_CAPSULE | Freq: Every evening | ORAL | Status: DC
  Administered 2016-04-30: 1 via ORAL
  Filled 2016-04-30: qty 1

## 2016-04-30 MED ORDER — ENOXAPARIN SODIUM 40 MG/0.4ML ~~LOC~~ SOLN
40.0000 mg | SUBCUTANEOUS | Status: DC
  Administered 2016-04-30: 40 mg via SUBCUTANEOUS
  Filled 2016-04-30: qty 0.4

## 2016-04-30 MED ORDER — METOPROLOL TARTRATE 50 MG PO TABS
50.0000 mg | ORAL_TABLET | Freq: Two times a day (BID) | ORAL | Status: DC
  Administered 2016-04-30 – 2016-05-01 (×2): 50 mg via ORAL
  Filled 2016-04-30 (×2): qty 1

## 2016-04-30 MED ORDER — POTASSIUM CHLORIDE CRYS ER 20 MEQ PO TBCR
40.0000 meq | EXTENDED_RELEASE_TABLET | Freq: Once | ORAL | Status: AC
  Administered 2016-04-30: 40 meq via ORAL
  Filled 2016-04-30: qty 2

## 2016-04-30 MED ORDER — GI COCKTAIL ~~LOC~~: 30.0000 mL | Freq: Four times a day (QID) | ORAL | Status: DC | PRN

## 2016-04-30 MED ORDER — AMITRIPTYLINE HCL 25 MG PO TABS
75.0000 mg | ORAL_TABLET | Freq: Every day | ORAL | Status: DC
  Administered 2016-04-30: 75 mg via ORAL
  Filled 2016-04-30: qty 3

## 2016-04-30 MED ORDER — DIPHENHYDRAMINE HCL 12.5 MG/5ML PO ELIX: 12.5000 mg | ORAL_SOLUTION | Freq: Every evening | ORAL | Status: DC | PRN

## 2016-04-30 MED ORDER — ONDANSETRON HCL 4 MG/2ML IJ SOLN: 4.0000 mg | Freq: Four times a day (QID) | INTRAMUSCULAR | Status: DC | PRN

## 2016-04-30 MED ORDER — NITROGLYCERIN 0.3 MG/HR TD PT24
0.3000 mg | MEDICATED_PATCH | Freq: Every day | TRANSDERMAL | Status: DC
  Administered 2016-04-30 – 2016-05-01 (×2): 0.3 mg via TRANSDERMAL
  Filled 2016-04-30 (×2): qty 1

## 2016-04-30 MED ORDER — ACETAMINOPHEN 500 MG PO TABS: 1000.0000 mg | ORAL_TABLET | Freq: Four times a day (QID) | ORAL | Status: DC | PRN

## 2016-04-30 MED ORDER — CLONAZEPAM 0.5 MG PO TABS: 0.5000 mg | ORAL_TABLET | Freq: Two times a day (BID) | ORAL | Status: DC | PRN

## 2016-04-30 MED ORDER — CITALOPRAM HYDROBROMIDE 20 MG PO TABS
40.0000 mg | ORAL_TABLET | Freq: Every day | ORAL | Status: DC
  Administered 2016-05-01: 40 mg via ORAL
  Filled 2016-04-30: qty 2

## 2016-04-30 MED ORDER — MORPHINE SULFATE (PF) 2 MG/ML IV SOLN: 2.0000 mg | INTRAVENOUS | Status: DC | PRN

## 2016-04-30 MED ORDER — NICOTINE 21 MG/24HR TD PT24
21.0000 mg | MEDICATED_PATCH | Freq: Every day | TRANSDERMAL | Status: DC
  Filled 2016-04-30: qty 1

## 2016-04-30 MED ORDER — PANTOPRAZOLE SODIUM 40 MG PO TBEC
40.0000 mg | DELAYED_RELEASE_TABLET | Freq: Every day | ORAL | Status: DC
  Administered 2016-04-30 – 2016-05-01 (×2): 40 mg via ORAL
  Filled 2016-04-30 (×2): qty 1

## 2016-04-30 MED ORDER — HYDRALAZINE HCL 20 MG/ML IJ SOLN
10.0000 mg | Freq: Four times a day (QID) | INTRAMUSCULAR | Status: DC | PRN
Start: 2016-04-30 — End: 2016-05-01

## 2016-04-30 MED ORDER — IOPAMIDOL (ISOVUE-370) INJECTION 76%
INTRAVENOUS | Status: AC
  Administered 2016-04-30: 80 mL
  Filled 2016-04-30: qty 100

## 2016-04-30 MED ORDER — ACETAMINOPHEN 325 MG PO TABS
650.0000 mg | ORAL_TABLET | ORAL | Status: DC | PRN
  Administered 2016-04-30: 650 mg via ORAL
  Filled 2016-04-30: qty 2

## 2016-04-30 MED ORDER — ASPIRIN EC 325 MG PO TBEC
325.0000 mg | DELAYED_RELEASE_TABLET | Freq: Every day | ORAL | Status: DC
  Administered 2016-04-30 – 2016-05-01 (×2): 325 mg via ORAL
  Filled 2016-04-30 (×2): qty 1

## 2016-04-30 NOTE — ED Notes (Signed)
Pt waiting for c-t still  Chest pain better

## 2016-04-30 NOTE — ED Notes (Signed)
To CT at this time.

## 2016-04-30 NOTE — ED Notes (Signed)
Admitting doctor at bedside 

## 2016-04-30 NOTE — H&P (Signed)
History and Physical        Hospital Admission Note Date: 04/30/2016  Patient name: Sharon [name] Medical record number: [id number] Date of birth: [birth year] Age: 60 y.o. Gender: female  PCP: Ria Bush, MD   Referring physician: Dr Kathrynn Humble   Patient coming from: home    Chief Complaint:  Chest pain since Wednesday (x4days)  HPI: Patient is a 60 year old female with history of hypertension, hyperlipidemia, obesity, smoking, prior TIA, Crohn's disease presented to ED with chest pain for last 4 days. Patient described the chest pain as intermittent, episodic midsternal radiating to left side and back, 5/10 in intensity associated with shortness of breath, nausea, vomiting otherwise no diaphoresis or palpitations or dizziness. Denied any hematemesis. She has GERD but feels the chest pain is somewhat different. Patient reports that for last 4 days her BP has been running high, this morning was 180's/100's. She reports that she has been taking all her medications. No prior cardiac workup. Per patient, grandmother had cardiac disease but no immediate family including her parents or siblings. Patient also reports significant stress at home including her job and currently in [employer]. ED work-up/course:  CBC, BMET unremarkable except potassium 3.2. D-dimer 0.47 troponin 1 negative. EKG rate 61, normal sinus rhythm showed no acute ST-T wave changes suggestive of ischemia. Chest x-ray negative for acute cardiopulmonary disease  Review of Systems: Positives marked in 'bold' Constitutional: Denies fever, chills, diaphoresis, poor appetite and fatigue.  HEENT: Denies photophobia, eye pain, redness, hearing loss, ear pain, congestion, sore throat, rhinorrhea, sneezing, mouth sores, trouble swallowing, neck pain, neck stiffness and tinnitus.   Respiratory: Denies SOB, DOE, cough,   and wheezing.   Cardiovascular: Please see history of present illness Gastrointestinal: + Has history of GERD, has nausea and vomiting but states chest pain is different. Denies  abdominal pain, diarrhea, constipation, blood in stool and abdominal distention.  Genitourinary: Denies dysuria, urgency, frequency, hematuria, flank pain and difficulty urinating.  Musculoskeletal: Denies myalgias, back pain, joint swelling, arthralgias and gait problem.  Skin: Denies pallor, rash and wound.  Neurological: Denies dizziness, seizures, syncope, weakness, light-headedness, numbness and headaches.  Hematological: Denies adenopathy. Easy bruising, personal or family bleeding history  Psychiatric/Behavioral: Denies suicidal ideation, mood changes, confusion, nervousness, sleep disturbance and agitation  Past Medical History: Past Medical History:  Diagnosis Date  . Anxiety and depression   . Arthritis   . B12 deficiency    pernicious anemia - monthly b12 shots  . Cervical cancer ([place]) 12/2008   s/p hysterectomy  . Crohn's disease ([place]) 2000   h/o stenotic crohn's, active in TI s/p resection 2000, PPD neg ([facility] Dr. [name]); has been on cimzia, remicade, 6MP, entocort, now stable on Entyvio and 6MP ([facility] [place])  . Genital warts   . GERD (gastroesophageal reflux disease)    severe, daily sxs if off PPI  . Hepatic steatosis 04/2011   diffuse on CT, 26m gallbladder polyp  . History of anemia    attributed to crohn's  . History of chicken pox   . History of syphilis 1980s  . HTN (hypertension)   . Migraines    and frequent other headaches (sinus  or stress)  . Scalp psoriasis   . Sensorineural hearing loss of both ears 12/2012   high freq  . Takotsubo syndrome 07/2012   cath 08/01/12, normal coronaries, LVEF 65%  . TIA (transient ischemic attack) 05/2010   at [facility] - TIA vs complex migraine (w/u negative - carotids, echo, TC doppler, and MRI normal)  . Tobacco abuse     Past  Surgical History:  Procedure Laterality Date  . abd [place]  04/2011   diffuse hepatic steatosis, 79m polyp in gallbaldder fundus, no stones, mod abd ath  . APPENDECTOMY  1998  . CARDIAC CATHETERIZATION  07/2012   normal LV fxn, widely patent coronaries  . CERVICAL BIOPSY  W/ LOOP ELECTRODE EXCISION  10/2008  . COLON RESECTION  10/2014   removal of scar tissue colon from [facility]  . COLONOSCOPY  10/2008   ileo-colonic anastomosis, ielocolonic crohn's  . COLONOSCOPY  05/2011   ileo-colonic anastomosis normal, normal mucosa  . COLONOSCOPY  10/15/2012   focal inflammation at anastomosis, no active crohn's, planning MR enterograph ([name]  . COLONOSCOPY  09/2014   [facility]  . ESOPHAGOGASTRODUODENOSCOPY  05/2011   nl esophagus, gastritis, nl duodenum  . hospitalization  05/2010   TIA vs complex migraine - w/u negative - head CT, MRI/MRA, carotid dopplers, echo.  treated with ASA and tramadol  . LEFT HEART CATHETERIZATION WITH CORONARY ANGIOGRAM N/A 08/01/2012   Procedure: LEFT HEART CATHETERIZATION WITH CORONARY ANGIOGRAM;  Surgeon: MSherren Mocha MD;  [facility];  Service: Cardiovascular;  Laterality: N/A;  . RIGHT COLECTOMY  2000   crohn's disease, ileocecal resection  . SHOULDER ARTHROSCOPY W/ ROTATOR CUFF REPAIR Right 05/31/2012   [facility] (Tamera Punt  . [place]  05/2010   nl LV fxn ,EF 60%  . VAGINAL HYSTERECTOMY  12/2008   LAVH/BSO    Medications: Prior to Admission medications   Medication Sig Start Date End Date Taking? Authorizing Provider  acetaminophen (TYLENOL) 500 MG tablet Take 1,000 mg by mouth every 6 (six) hours as needed for mild pain or headache.    Yes Historical Provider, MD  acidophilus (RISAQUAD) CAPS capsule Take 1 capsule by mouth every evening.   Yes Historical Provider, MD  amitriptyline (ELAVIL) 50 MG tablet Take 1.5 tablets (75 mg total) by mouth at bedtime. 11/09/15  Yes JRia Bush MD  citalopram (CELEXA) 40  MG tablet Take 1 tablet (40 mg total) by mouth daily. 11/09/15  Yes JRia Bush MD  clonazePAM (KLONOPIN) 0.5 MG tablet TAKE ONE TABLET BY MOUTH TWICE DAILY AS NEEDED Patient taking differently: TAKE ONE TABLET BY MOUTH at night 01/27/16  Yes JRia Bush MD  diphenhydrAMINE (BENADRYL) 25 mg capsule Take 12.5 mg by mouth at bedtime as needed for sleep.   Yes Historical Provider, MD  metoprolol (LOPRESSOR) 50 MG tablet Take 1 tablet (50 mg total) by mouth 2 (two) times daily. 05/01/15  Yes JRia Bush MD  cyanocobalamin (,VITAMIN B-12,) 1000 MCG/ML injection Inject 1,000 mcg into the muscle every 30 (thirty) days.    Historical Provider, MD  HYDROmorphone (DILAUDID) 2 MG tablet Take 1 tablet (2 mg total) by mouth every 6 (six) hours as needed for severe pain. Patient not taking: Reported on 04/30/2016 12/28/15   JPaulette Blanch MD  ondansetron (ZOFRAN ODT) 4 MG disintegrating tablet Take 1 tablet (4 mg total) by mouth every 8 (eight) hours as needed for nausea or vomiting. Patient not taking: Reported on 04/30/2016 12/28/15   JLuvenia Starch  Jamal Collin, MD  pantoprazole (PROTONIX) 20 MG tablet Take 1 tablet (20 mg total) by mouth daily. Patient not taking: Reported on 04/30/2016 11/09/15   Ria Bush, MD  vedolizumab (ENTYVIO) 300 MG injection Inject into the vein every 8 (eight) weeks.     Historical Provider, MD    Allergies:   Allergies  Allergen Reactions  . Infliximab Dermatitis  . Codeine Nausea And Vomiting  . Flagyl [Metronidazole] Nausea And Vomiting  . Hydrocodone Nausea And Vomiting  . Ibuprofen Other (See Comments)    Pt states that she is unable to take due to her Crohn's disease.  Marland Kitchen Remeron [Mirtazapine] Other (See Comments)    Reaction:  Makes pt lethargic     Social History:  reports that she has been smoking Cigarettes.  She has a 3.30 pack-year smoking history. She has never used smokeless tobacco. She reports that she does not drink alcohol or use drugs.  Family  History: Family History  Problem Relation Age of Onset  . Crohn's disease Daughter     crohn's, ulcerative colitis  . Ulcerative colitis Daughter   . Stroke Mother   . Hypertension Mother   . Hyperlipidemia Mother   . Hypertension Father   . Crohn's disease Father   . Crohn's disease Sister   . Cancer Sister     ovarian  . Crohn's disease Paternal Aunt   . Crohn's disease Paternal Uncle   . Diabetes Neg Hx     Physical Exam: Blood pressure 140/73, pulse (!) 59, temperature 98.3 F (36.8 C), temperature source Oral, resp. rate 17, height 5' 1"  (1.549 m), weight 74.8 kg (165 lb), SpO2 99 %. General: Alert, awake, oriented x3, in no acute distress. HEENT: normocephalic, atraumatic, anicteric sclera, pink conjunctiva, pupils equal and reactive to light and accomodation, oropharynx clear Neck: supple, no masses or lymphadenopathy, no goiter, no bruits  Heart: Regular rate and rhythm, without murmurs, rubs or gallops. Lungs: Clear to auscultation bilaterally, no wheezing, rales or rhonchi. Abdomen: Soft, nontender, nondistended, positive bowel sounds, no masses. Extremities: No clubbing, cyanosis or edema with positive pedal pulses. Neuro: Grossly intact, no focal neurological deficits, strength 5/5 upper and lower extremities bilaterally Psych: alert and oriented x 3, normal mood and affect Skin: no rashes or lesions, warm and dry   LABS on Admission:  Basic Metabolic Panel:  Recent Labs Lab 04/30/16 1455  NA 139  K 3.2*  CL 109  CO2 22  GLUCOSE 108*  BUN 5*  CREATININE 0.83  CALCIUM 8.8*   Liver Function Tests: No results for input(s): AST, ALT, ALKPHOS, BILITOT, PROT, ALBUMIN in the last 168 hours. No results for input(s): LIPASE, AMYLASE in the last 168 hours. No results for input(s): AMMONIA in the last 168 hours. CBC:  Recent Labs Lab 04/30/16 1455  WBC 7.4  HGB 12.5  HCT 38.1  MCV 91.6  PLT 252   Cardiac Enzymes: No results for input(s): CKTOTAL,  CKMB, CKMBINDEX, TROPONINI in the last 168 hours. BNP: Invalid input(s): POCBNP CBG: No results for input(s): GLUCAP in the last 168 hours.  Radiological Exams on Admission:  No results found.  *I have personally reviewed the images above*  EKG: Independently reviewed. *EKG rate 61, normal sinus rhythm showed no acute ST-T wave changes suggestive of ischemia.    Assessment/Plan Principal Problem:  Atypical Chest pain: Risk factors including hypertension, hyperlipidemia, nicotine abuse, no prior cardiac workup - Troponin 1 negative, EKG with no acute ischemic changes, d-dimer negative - Given  elevated BP in the last few days, associated chest pain radiating to back, rule out aortic dissection, obtain CTA chest - NPO after midnight, cardiology consult in a.m. for possible stress test in am - Start aspirin, nitroglycerin patch, continue beta blocker, start amlodipine - Hydralazine IV as needed with parameters for BP - Continue PPI  Active Problems:   HTN (hypertension) Urgency - Continue beta blocker, add Norvasc    Crohn's disease ([place]) - Currently stable, is on the patient is on entyvio, follows [facility]    Smoker - Patient counseled on smoking cessation, place on nicotine patch    TIA (transient ischemic attack) - continue aspirin    GERD (gastroesophageal reflux disease) - Continue PPI    HLD (hyperlipidemia) - Patient is not on any statins, obtain lipid panel  Anxiety - Continue Klonopin, Celexa   DVT prophylaxis: Lovenox  CODE STATUS: Full CODE STATUS  Consults called: None  Family Communication: Admission, patients condition and plan of care including tests being ordered have been discussed with the patient who indicates understanding and agree with the plan and Code Status  Admission status: obs tele  Disposition plan: Further plan will depend as patient's clinical course evolves and further radiologic and laboratory data become  available.   Time Spent on Admission: 72mns   Dijon Kohlman M.D. [facility] 04/30/2016, 5:17 PM Pager: [phone] If 7PM-7AM, please contact night-coverage [email or web address] Password [email or web address]

## 2016-04-30 NOTE — ED Triage Notes (Signed)
Patient complains of chest tightness since Wednesday with BP running high. Complains of some nausea with same. Mild shortness of breath earlier in week, NAD

## 2016-04-30 NOTE — ED Notes (Signed)
Attempted report 

## 2016-04-30 NOTE — ED Provider Notes (Signed)
[facility] DEPT Provider Note   CSN: [id number] Arrival date & time: 04/30/16  1445     History   Chief Complaint Chief Complaint  Patient presents with  . Chest Pain    HPI Sharon [name] is a 60 y.o. female.  HPI Pt comes in with cc of chest pain. Pt has hx of HTN, HL, obesity, smoking, TIA. She has no CAD. She reports that she has been having some chest discomfort for the past several days. She had chest pain prior to ER arrival. Chest pain is intermittent, unprovoked, and lasts for few minutes. Pain is midsternal, sharp and radiates to the back and L side sometimes. Pt gets DIB with the pain. No nausea, sweats. Pt also noted that her BP last few days has been running elevated. This AM, her BP was 180s/100s.   Past Medical History:  Diagnosis Date  . Anxiety and depression   . Arthritis   . B12 deficiency    pernicious anemia - monthly b12 shots  . Cervical cancer ([place]) 12/2008   s/p hysterectomy  . Crohn's disease ([place]) 2000   h/o stenotic crohn's, active in TI s/p resection 2000, PPD neg ([facility] Dr. [name]); has been on cimzia, remicade, 6MP, entocort, now stable on Entyvio and 6MP ([facility] [place])  . Genital warts   . GERD (gastroesophageal reflux disease)    severe, daily sxs if off PPI  . Hepatic steatosis 04/2011   diffuse on CT, 75m gallbladder polyp  . History of anemia    attributed to crohn's  . History of chicken pox   . History of syphilis 1980s  . HTN (hypertension)   . Migraines    and frequent other headaches (sinus or stress)  . Scalp psoriasis   . Sensorineural hearing loss of both ears 12/2012   high freq  . Takotsubo syndrome 07/2012   cath 08/01/12, normal coronaries, LVEF 65%  . TIA (transient ischemic attack) 05/2010   [facility] vs complex [facility] (w/u negative - carotids, echo, TC doppler, and MRI normal)  . Tobacco abuse     Patient Active Problem List   Diagnosis Date Noted  . Atypical chest pain 04/30/2016  .  Hematuria 01/01/2016  . Hyperglycemia 05/09/2013  . HLD (hyperlipidemia) 02/12/2013  . Diarrhea 12/18/2012  . Right knee pain 11/13/2012  . Takotsubo syndrome   . Chest pain 08/02/2012  . Bilateral hearing loss 06/27/2012  . GERD (gastroesophageal reflux disease)   . Skin rash 04/27/2012  . Right shoulder pain 03/13/2012  . History of pneumonia 01/18/2012  . Headache(784.0) 10/13/2011  . Healthcare maintenance 08/16/2011  . Leukopenia 08/16/2011  . Vitamin B12 deficiency   . HTN (hypertension)   . Migraines   . [place]   . Anxiety and depression   . Smoker   . TIA (transient ischemic attack)   . Hepatic steatosis 04/30/2011    Past Surgical History:  Procedure Laterality Date  . abd [place] 04/2011   diffuse hepatic steatosis, 564mpolyp in gallbaldder fundus, no stones, mod abd ath  . APPENDECTOMY  1998  . CARDIAC CATHETERIZATION  07/2012   normal LV fxn, widely patent coronaries  . CERVICAL BIOPSY  W/ LOOP ELECTRODE EXCISION  10/2008  . COLON RESECTION  10/2014   [facility] scar tissue [facility] . COLONOSCOPY  10/2008   ileo-colonic anastomosis, ielocolonic crohn's  . COLONOSCOPY  05/2011   ileo-colonic anastomosis normal, normal  mucosa  . COLONOSCOPY  10/15/2012   focal inflammation at anastomosis, no active crohn's, planning MR enterograph [name])  . COLONOSCOPY  09/2014   [facility]  . ESOPHAGOGASTRODUODENOSCOPY  05/2011   nl esophagus, gastritis, nl duodenum  . hospitalization  05/2010   TIA vs complex migraine - w/u negative - head CT, MRI/MRA, carotid dopplers, echo.  treated with ASA and tramadol  . LEFT HEART CATHETERIZATION WITH CORONARY ANGIOGRAM N/A 08/01/2012   Procedure: LEFT HEART CATHETERIZATION WITH CORONARY ANGIOGRAM;  Surgeon: Sherren Mocha, MD;  [facility];  Service: Cardiovascular;  Laterality: N/A;  . RIGHT COLECTOMY  2000   crohn's disease, ileocecal resection  . SHOULDER ARTHROSCOPY W/ ROTATOR  CUFF REPAIR Right 05/31/2012   [facility] Tamera Punt)  . US ECHOCARDIOGRAPHY  05/2010   nl LV fxn ,EF 60%  . VAGINAL HYSTERECTOMY  12/2008   [facility]VH/BSO    OB History    Gravida Para Term Preterm AB Living   4 3     1 3    SAB TAB Ectopic Multiple Live Births   1               Home Medications    Prior to Admission medications   Medication Sig Start Date End Date Taking? Authorizing Provider  acetaminophen (TYLENOL) 500 MG tablet Take 1,000 mg by mouth every 6 (six) hours as needed for mild pain or headache.    Yes Historical Provider, MD  acidophilus (RISAQUAD) CAPS capsule Take 1 capsule by mouth every evening.   Yes Historical Provider, MD  amitriptyline (ELAVIL) 50 MG tablet Take 1.5 tablets (75 mg total) by mouth at bedtime. 11/09/15  Yes Ria Bush, MD  citalopram (CELEXA) 40 MG tablet Take 1 tablet (40 mg total) by mouth daily. 11/09/15  Yes Ria Bush, MD  clonazePAM (KLONOPIN) 0.5 MG tablet TAKE ONE TABLET BY MOUTH TWICE DAILY AS NEEDED Patient taking differently: TAKE ONE TABLET BY MOUTH at night 01/27/16  Yes Ria Bush, MD  diphenhydrAMINE (BENADRYL) 25 mg capsule Take 12.5 mg by mouth at bedtime as needed for sleep.   Yes Historical Provider, MD  metoprolol (LOPRESSOR) 50 MG tablet Take 1 tablet (50 mg total) by mouth 2 (two) times daily. 05/01/15  Yes Ria Bush, MD  cyanocobalamin (,VITAMIN B-12,) 1000 MCG/ML injection Inject 1,000 mcg into the muscle every 30 (thirty) days.    Historical Provider, MD  HYDROmorphone (DILAUDID) 2 MG tablet Take 1 tablet (2 mg total) by mouth every 6 (six) hours as needed for severe pain. Patient not taking: Reported on 04/30/2016 12/28/15   Paulette Blanch, MD  ondansetron (ZOFRAN ODT) 4 MG disintegrating tablet Take 1 tablet (4 mg total) by mouth every 8 (eight) hours as needed for nausea or vomiting. Patient not taking: Reported on 04/30/2016 12/28/15   Paulette Blanch, MD  pantoprazole (PROTONIX) 20 MG tablet Take 1 tablet (20 mg  total) by mouth daily. Patient not taking: Reported on 04/30/2016 11/09/15   Ria Bush, MD  vedolizumab (ENTYVIO) 300 MG injection Inject into the vein every 8 (eight) weeks.     Historical Provider, MD    Family History Family History  Problem Relation Age of Onset  . Crohn's disease Daughter     crohn's, ulcerative colitis  . Ulcerative colitis Daughter   . Stroke Mother   . Hypertension Mother   . Hyperlipidemia Mother   . Hypertension Father   . Crohn's disease Father   . Crohn's disease Sister   .  Cancer Sister     ovarian  . Crohn's disease Paternal Aunt   . Crohn's disease Paternal Uncle   . Diabetes Neg Hx     Social History Social History  Substance Use Topics  . Smoking status: Current Every Day Smoker    Packs/day: 0.33    Years: 10.00    Types: Cigarettes  . Smokeless tobacco: Never Used  . Alcohol use No     Comment: remote EtOh use     Allergies   Infliximab; Codeine; Flagyl [metronidazole]; Hydrocodone; Ibuprofen; and Remeron [mirtazapine]   Review of Systems Review of Systems  ROS 10 Systems reviewed and are negative for acute change except as noted in the HPI.      Physical Exam Updated Vital Signs BP 140/73   Pulse (!) 56   Temp 98.3 F (36.8 C) (Oral)   Resp 17   Ht 5' 1"  (1.549 m)   Wt 165 lb (74.8 kg)   SpO2 98%   BMI 31.18 kg/m   Physical Exam  Constitutional: She is oriented to person, place, and time. She appears well-developed.  HENT:  Head: Normocephalic and atraumatic.  Eyes: Conjunctivae and EOM are normal. Pupils are equal, round, and reactive to light.  Neck: Normal range of motion. Neck supple.  Cardiovascular: Normal rate, regular rhythm and normal heart sounds.   Pulmonary/Chest: Effort normal and breath sounds normal. No respiratory distress.  Abdominal: Soft. Bowel sounds are normal. She exhibits no distension. There is no tenderness. There is no rebound and no guarding.  Neurological: She is alert and  oriented to person, place, and time.  Skin: Skin is warm and dry.  Nursing note and vitals reviewed.    ED Treatments / Results  Labs (all labs ordered are listed, but only abnormal results are displayed) Labs Reviewed  BASIC METABOLIC PANEL - Abnormal; Notable for the following:       Result Value   Potassium 3.2 (*)    Glucose, Bld 108 (*)    BUN 5 (*)    Calcium 8.8 (*)    All other components within normal limits  CBC  D-DIMER, QUANTITATIVE (NOT AT [facility])  Randolm Idol, ED    EKG  EKG Interpretation  Date/Time:  Saturday April 30 2016 14:50:33 EDT Ventricular Rate:  61 PR Interval:  158 QRS Duration: 86 QT Interval:  436 QTC Calculation: 438 R Axis:   53 Text Interpretation:  Normal sinus rhythm Cannot rule out Anterior infarct , age undetermined Abnormal ECG No acute changes No significant change since last tracing Confirmed by Kathrynn Humble, MD, [facility] Comp [phone]) on 04/30/2016 3:19:34 PM       Radiology Dg Chest 2 View  Result Date: 04/30/2016 CLINICAL DATA:  Acute chest pain and shortness of breath for 3 days. EXAM: CHEST  2 VIEW COMPARISON:  11/03/2015 and prior radiographs FINDINGS: The cardiomediastinal silhouette is unremarkable. Mild elevation of the right hemidiaphragm again noted. There is no evidence of focal airspace disease, pulmonary edema, suspicious pulmonary nodule/mass, pleural effusion, or pneumothorax. No acute bony abnormalities are identified. IMPRESSION: No active cardiopulmonary disease. Electronically Signed   By: Margarette Canada M.D.   On: 04/30/2016 15:47    Procedures Procedures (including critical care time)  Medications Ordered in ED Medications - No data to display   Initial Impression / Assessment and Plan / ED Course  I have reviewed the triage vital signs and the nursing notes.  Pertinent labs & imaging results that were available during my care of  the patient were reviewed by me and considered in my medical decision making (see chart  for details).  Clinical Course    Pt comes in with cc of chest pain. Chest pain is atypical. Her HEAR score is 4 (1 for hx, 1 for age and 2 for risk factors). We will keep her in the hospital for serial trops and ACS r/o.  Pt's BP is WNL here.    Final Clinical Impressions(s) / ED Diagnoses   Final diagnoses:  Atypical chest pain    New Prescriptions New Prescriptions   No medications on file     Varney Biles, MD 04/30/16 1714

## 2016-04-30 NOTE — ED Notes (Signed)
Iv unsuccessful attempts x 2

## 2016-04-30 NOTE — ED Notes (Signed)
The pt has had lt upper chest pain since Thursday and before no chest pain now she has also had some sob  And nausea with a headache also  No precipating factor.

## 2016-05-01 ENCOUNTER — Observation Stay (HOSPITAL_COMMUNITY): Payer: Self-pay

## 2016-05-01 ENCOUNTER — Observation Stay (HOSPITAL_BASED_OUTPATIENT_CLINIC_OR_DEPARTMENT_OTHER): Payer: Self-pay

## 2016-05-01 DIAGNOSIS — R079 Chest pain, unspecified: Secondary | ICD-10-CM

## 2016-05-01 DIAGNOSIS — R0789 Other chest pain: Secondary | ICD-10-CM

## 2016-05-01 LAB — LIPID PANEL
CHOL/HDL RATIO: 5.1 ratio
Cholesterol: 169 mg/dL (ref 0–200)
HDL: 33 mg/dL — AB (ref >40)
LDL CALC: 76 mg/dL (ref 0–99)
TRIGLYCERIDES: 298 mg/dL — AB (ref <150)
VLDL: 60 mg/dL — AB (ref 0–40)

## 2016-05-01 LAB — BASIC METABOLIC PANEL
Anion gap: 7 (ref 5–15)
BUN: 7 mg/dL (ref 6–20)
CO2: 24 mmol/L (ref 22–32)
CREATININE: 0.76 mg/dL (ref 0.44–1.00)
Calcium: 8.3 mg/dL — ABNORMAL LOW (ref 8.9–10.3)
Chloride: 109 mmol/L (ref 101–111)
GFR calc Af Amer: >60 mL/min (ref >60)
GLUCOSE: 115 mg/dL — AB (ref 65–99)
Potassium: 3.4 mmol/L — ABNORMAL LOW (ref 3.5–5.1)
SODIUM: 140 mmol/L (ref 135–145)

## 2016-05-01 LAB — NM MYOCAR MULTI W/SPECT W/WALL MOTION / EF
CHL CUP RESTING HR STRESS: 55 {beats}/min
Peak HR: 74 {beats}/min

## 2016-05-01 LAB — TROPONIN I
Troponin I: <0.03 ng/mL (ref <0.03)
Troponin I: <0.03 ng/mL (ref <0.03)

## 2016-05-01 MED ORDER — ASPIRIN EC 81 MG PO TBEC: 81.0000 mg | DELAYED_RELEASE_TABLET | Freq: Every day | ORAL | 3 refills | Status: AC

## 2016-05-01 MED ORDER — POTASSIUM CHLORIDE CRYS ER 20 MEQ PO TBCR: 40.0000 meq | EXTENDED_RELEASE_TABLET | ORAL | Status: DC

## 2016-05-01 MED ORDER — REGADENOSON 0.4 MG/5ML IV SOLN
INTRAVENOUS | Status: AC
  Filled 2016-05-01: qty 5

## 2016-05-01 MED ORDER — NITROGLYCERIN 0.4 MG SL SUBL: 0.4000 mg | SUBLINGUAL_TABLET | SUBLINGUAL | 12 refills | Status: DC | PRN

## 2016-05-01 MED ORDER — TECHNETIUM TC 99M TETROFOSMIN IV KIT
30.0000 | PACK | Freq: Once | INTRAVENOUS | Status: AC | PRN
Start: 2016-05-01 — End: 2016-05-01
  Administered 2016-05-01: 30 via INTRAVENOUS

## 2016-05-01 MED ORDER — TECHNETIUM TC 99M TETROFOSMIN IV KIT
10.0000 | PACK | Freq: Once | INTRAVENOUS | Status: AC | PRN
  Administered 2016-05-01: 10 via INTRAVENOUS

## 2016-05-01 MED ORDER — BUTALBITAL-APAP-CAFFEINE 50-325-40 MG PO TABS
1.0000 | ORAL_TABLET | Freq: Once | ORAL | Status: AC
  Administered 2016-05-01: 1 via ORAL
  Filled 2016-05-01: qty 1

## 2016-05-01 MED ORDER — REGADENOSON 0.4 MG/5ML IV SOLN
0.4000 mg | Freq: Once | INTRAVENOUS | Status: AC
  Administered 2016-05-01: 0.4 mg via INTRAVENOUS
  Filled 2016-05-01: qty 5

## 2016-05-01 MED ORDER — PANTOPRAZOLE SODIUM 40 MG PO TBEC: 40.0000 mg | DELAYED_RELEASE_TABLET | Freq: Every day | ORAL | 3 refills | Status: DC

## 2016-05-01 NOTE — Discharge Instructions (Signed)
Chest Wall Pain Chest wall pain is pain in or around the bones and muscles of your chest. Sometimes, an injury causes this pain. Sometimes, the cause may not be known. This pain may take several weeks or longer to get better. HOME CARE INSTRUCTIONS  Pay attention to any changes in your symptoms. Take these actions to help with your pain:   Rest as told by your health care provider.   Avoid activities that cause pain. These include any activities that use your chest muscles or your abdominal and side muscles to lift heavy items.   If directed, apply ice to the painful area:  Put ice in a plastic bag.  Place a towel between your skin and the bag.  Leave the ice on for 20 minutes, 2-3 times per day.  Take over-the-counter and prescription medicines only as told by your health care provider.  Do not use tobacco products, including cigarettes, chewing tobacco, and e-cigarettes. If you need help quitting, ask your health care provider.  Keep all follow-up visits as told by your health care provider. This is important. SEEK MEDICAL CARE IF:  You have a fever.  Your chest pain becomes worse.  You have new symptoms. SEEK IMMEDIATE MEDICAL CARE IF:  You have nausea or vomiting.  You feel sweaty or light-headed.  You have a cough with phlegm (sputum) or you cough up blood.  You develop shortness of breath.   This information is not intended to replace advice given to you by your health care provider. Make sure you discuss any questions you have with your health care provider.   Document Released: 08/15/2005 Document Revised: 05/06/2015 Document Reviewed: 11/10/2014 Elsevier Interactive Patient Education 2016 [employer].  Pharmacologic Stress Electrocardiogram A pharmacologic stress electrocardiogram is a heart (cardiac) test that uses nuclear imaging to evaluate the blood supply to your heart. This test may also be called a pharmacologic stress electrocardiography.  Pharmacologic means that a medicine is used to increase your heart rate and blood pressure.  This stress test is done to find areas of poor blood flow to the heart by determining the extent of coronary artery disease (CAD). Some people exercise on a treadmill, which naturally increases the blood flow to the heart. For those people unable to exercise on a treadmill, a medicine is used. This medicine stimulates your heart and will cause your heart to beat harder and more quickly, as if you were exercising.  Pharmacologic stress tests can help determine:  The adequacy of blood flow to your heart during increased levels of activity in order to clear you for discharge home.  The extent of coronary artery blockage caused by CAD.  Your prognosis if you have suffered a heart attack.  The effectiveness of cardiac procedures done, such as an angioplasty, which can increase the circulation in your coronary arteries.  Causes of chest pain or pressure. LET [facility] CARE PROVIDER KNOW ABOUT:  Any allergies you have.  All medicines you are taking, including vitamins, herbs, eye drops, creams, and over-the-counter medicines.  Previous problems you or members of your family have had with the use of anesthetics.  Any blood disorders you have.  Previous surgeries you have had.  Medical conditions you have.  Possibility of pregnancy, if this applies.  If you are currently breastfeeding. RISKS AND COMPLICATIONS Generally, this is a safe procedure. However, as with any procedure, complications can occur. Possible complications include:  You develop pain or pressure in the following areas:  Chest.  Jaw or neck.  Between your shoulder blades.  Radiating down your left arm.  Headache.  Dizziness or light-headedness.  Shortness of breath.  Increased or irregular heartbeat.  Low blood pressure.  Nausea or vomiting.  Flushing.  Redness going up the arm and slight pain during injection  of medicine.  Heart attack (rare). BEFORE THE PROCEDURE   Avoid all forms of caffeine for 24 hours before your test or as directed by your health care provider. This includes coffee, tea (even decaffeinated tea), caffeinated sodas, chocolate, cocoa, and certain pain medicines.  Follow your health care provider's instructions regarding eating and drinking before the test.  Take your medicines as directed at regular times with water unless instructed otherwise. Exceptions may include:  If you have diabetes, ask how you are to take your insulin or pills. It is common to adjust insulin dosing the morning of the test.  If you are taking beta-blocker medicines, it is important to talk to your health care provider about these medicines well before the date of your test. Taking beta-blocker medicines may interfere with the test. In some cases, these medicines need to be changed or stopped 24 hours or more before the test.  If you wear a nitroglycerin patch, it may need to be removed prior to the test. Ask your health care provider if the patch should be removed before the test.  If you use an inhaler for any breathing condition, bring it with you to the test.  If you are an outpatient, bring a snack so you can eat right after the stress phase of the test.  Do not smoke for 4 hours prior to the test or as directed by your health care provider.  Do not apply lotions, powders, creams, or oils on your chest prior to the test.  Wear comfortable shoes and clothing. Let your health care provider know if you were unable to complete or follow the preparations for your test. PROCEDURE   Multiple patches (electrodes) will be put on your chest. If needed, small areas of your chest may be shaved to get better contact with the electrodes. Once the electrodes are attached to your body, multiple wires will be attached to the electrodes, and your heart rate will be monitored.  An IV access will be started. A  nuclear trace (isotope) is given. The isotope may be given intravenously, or it may be swallowed. Nuclear refers to several types of radioactive isotopes, and the nuclear isotope lights up the arteries so that the nuclear images are clear. The isotope is absorbed by your body. This results in low radiation exposure.  A resting nuclear image is taken to show how your heart functions at rest.  A medicine is given through the IV access.  A second scan is done about 1 hour after the medicine injection and determines how your heart functions under stress.  During this stress phase, you will be connected to an electrocardiogram machine. Your blood pressure and oxygen levels will be monitored. AFTER THE PROCEDURE   Your heart rate and blood pressure will be monitored after the test.  You may return to your normal schedule, including diet,activities, and medicines, unless your health care provider tells you otherwise.   This information is not intended to replace advice given to you by your health care provider. Make sure you discuss any questions you have with your health care provider.   Document Released: 01/01/2009 Document Revised: 08/20/2013 Document Reviewed: 04/22/2013 Elsevier Interactive Patient Education 2016  [employer].

## 2016-05-01 NOTE — Progress Notes (Signed)
   Juletta H Lehane presented for a Lexiscan cardiolite today.  No immediate complications.  Stress imaging is pending at this time.  Charlie Pitter, PA-C 05/01/2016, 12:54 PM

## 2016-05-01 NOTE — Progress Notes (Signed)
Nuc result reviewed: IMPRESSION: 1. No reversible ischemia or infarction. 2. Normal left ventricular wall motion. 3. Left ventricular ejection fraction 70% 4. Non invasive risk stratification*: Low  Result shared with patient, IM. OK to dc from cardiac standpoint. Further tx of hypokalemia per IM. Can follow up with PCP for BP.  Levy Wellman PA-C

## 2016-05-01 NOTE — Progress Notes (Addendum)
[facility]                                                                              Patient Demographics  Sharon [name], is a 60 y.o. female, DOB - [birth year], LAG:[id number]  Admit date - 04/30/2016   Admitting Physician Anderson Coppock Krystal Eaton, MD  Outpatient Primary MD for the patient is Ria Bush, MD  Outpatient specialists:   LOS - 0  days    Chief Complaint  Patient presents with  . Chest Pain       Brief summary  Patient is a 60 year old female with history of hypertension, hyperlipidemia, obesity, smoking, prior TIA, Crohn's disease presented to ED with chest pain for last 4 days. Patient described the chest pain as intermittent, episodic midsternal radiating to left side and back, 5/10 in intensity associated with shortness of breath, nausea, vomiting otherwise no diaphoresis or palpitations or dizziness. Denied any hematemesis. She has GERD but feels the chest pain is somewhat different. Patient reports that for last 4 days her BP has been running high, this morning was 180's/100's. D-dimer 0.47, troponin negative, CT angiogram of the chest negative for any PE or aortic dissection.   Assessment & Plan   Atypical Chest pain: Risk factors including hypertension, hyperlipidemia, nicotine abuse, no prior cardiac workup - Troponin 3 negative, EKG with no acute ischemic changes, d-dimer negative - CT angiogram of the chest negative for PE or aortic dissection  - cardiology consulted for possible stress test  - Continue aspirin, nitroglycerin patch, beta blocker. BP is much better today and borderline low, discontinued amlodipine.  Active Problems:   HTN (hypertension) Urgency resolved - Continue beta blocker     Crohn's disease ([place]) - Currently stable, is on the patient is on entyvio, follows [facility]    Smoker - Patient counseled on smoking cessation, place on nicotine patch    TIA (transient ischemic attack) - continue  aspirin    GERD (gastroesophageal reflux disease) - Continue PPI    HLD (hyperlipidemia) - Patient is not on any statins, LDL 76, triglycerides 298, cholesterol 169  Anxiety - Continue Klonopin, Celexa  Code Status: Full CODE STATUS DVT Prophylaxis:  Lovenox  Family Communication: Discussed in detail with the patient, all imaging results, lab results explained to the patient    Disposition Plan: Stress test today  Time Spent in minutes   25 minutes  Procedures:    Consultants:   cardiology  Antimicrobials:      Medications  Scheduled Meds: . acidophilus  1 capsule Oral QPM  . amitriptyline  75 mg Oral QHS  . aspirin EC  325 mg Oral Daily  . citalopram  40 mg Oral Daily  . enoxaparin (LOVENOX) injection  40 mg Subcutaneous Q24H  . metoprolol  50 mg Oral BID  . nicotine  21 mg Transdermal Daily  . nitroGLYCERIN  0.3 mg Transdermal Daily  . pantoprazole  40 mg Oral Daily   Continuous Infusions:  PRN Meds:.acetaminophen, clonazePAM, diphenhydrAMINE, gi cocktail, hydrALAZINE, morphine injection, ondansetron (ZOFRAN) IV   Antibiotics   Anti-infectives    None        Subjective:  Sharon [name] was seen and examined today.   Patient denies dizziness, chest pain, shortness of breath, abdominal pain, N/V/D/C, new weakness, numbess, tingling. No acute events overnight.    Objective:   Vitals:   04/30/16 1900 04/30/16 1930 05/01/16 0047 05/01/16 0430  BP: 123/98 148/98 (!) 118/54 (!) 98/51  Pulse: (!) 59 79 (!) 53 (!) 57  Resp: 15 23 18 16   Temp:   98.2 F (36.8 C) 97.8 F (36.6 C)  TempSrc:   Oral Oral  SpO2: 99% 99% 95% 98%  Weight:    75.9 kg (167 lb 4.8 oz)  Height:    5' 1"  (1.549 m)    Intake/Output Summary (Last 24 hours) at 05/01/16 1005 Last data filed at 04/30/16 2030  Gross per 24 hour  Intake              240 ml  Output                0 ml  Net              240 ml     Wt Readings from Last 3 Encounters:  05/01/16 75.9 kg (167  lb 4.8 oz)  01/01/16 76 kg (167 lb 8 oz)  12/27/15 74.4 kg (164 lb)     Exam  General: Alert and oriented x 3, NAD  HEENT:    Neck: Supple, no JVD  Cardiovascular: S1 S2 auscultated, no rubs, murmurs or gallops. Regular rate and rhythm.  Respiratory: Clear to auscultation bilaterally, no wheezing, rales or rhonchi  Gastrointestinal: Soft, nontender, nondistended, + bowel sounds  Ext: no cyanosis clubbing or edema  Neuro: AAOx3, Cr N's II- XII. Strength 5/5 upper and lower extremities bilaterally  Skin: No rashes  Psych: Normal affect and demeanor, alert and oriented x3    Data Reviewed:  I have personally reviewed following labs and imaging studies  Micro Results No results found for this or any previous visit (from the past 240 hour(s)).  Radiology Reports Dg Chest 2 View  Result Date: 04/30/2016 CLINICAL DATA:  Acute chest pain and shortness of breath for 3 days. EXAM: CHEST  2 VIEW COMPARISON:  11/03/2015 and prior radiographs FINDINGS: The cardiomediastinal silhouette is unremarkable. Mild elevation of the right hemidiaphragm again noted. There is no evidence of focal airspace disease, pulmonary edema, suspicious pulmonary nodule/mass, pleural effusion, or pneumothorax. No acute bony abnormalities are identified. IMPRESSION: No active cardiopulmonary disease. Electronically Signed   By: Margarette Canada M.D.   On: 04/30/2016 15:47   Ct Angio Chest Aorta W &/or Wo Contrast  Result Date: 04/30/2016 CLINICAL DATA:  Chest pain radiating to left side for 2 days ago. Crohn's disease. Personal history of cervical carcinoma. EXAM: CT ANGIOGRAPHY CHEST WITH CONTRAST TECHNIQUE: Multidetector CT imaging of the chest was performed using the standard protocol during bolus administration of intravenous contrast. Multiplanar CT image reconstructions and MIPs were obtained to evaluate the vascular anatomy. CONTRAST:  80 mL Isovue 370 COMPARISON:  None. FINDINGS: Cardiovascular: Normal heart  size. No evidence pericardial effusion. Aortic atherosclerosis. No evidence of thoracic aortic dissection or aneurysm. Satisfactory opacification of pulmonary arteries noted, and no pulmonary emboli identified. Mediastinum/Lymph Nodes: No masses or pathologically enlarged lymph nodes identified. Lungs/Pleura: No pulmonary mass, infiltrate, or effusion. Upper abdomen: Diffuse hepatic steatosis. Musculoskeletal: No chest wall mass or suspicious bone lesions identified. Review of the MIP images confirms the above findings. IMPRESSION: No evidence of thoracic aortic dissection or aneurysm. Aortic atherosclerosis. No evidence of  pulmonary embolism.  No active lung disease. Diffuse hepatic steatosis. Electronically Signed   By: Earle Gell M.D.   On: 04/30/2016 20:37    Lab Data:  CBC:  Recent Labs Lab 04/30/16 1455  WBC 7.4  HGB 12.5  HCT 38.1  MCV 91.6  PLT 980   Basic Metabolic Panel:  Recent Labs Lab 04/30/16 1455 05/01/16 0620  NA 139 140  K 3.2* 3.4*  CL 109 109  CO2 22 24  GLUCOSE 108* 115*  BUN 5* 7  CREATININE 0.83 0.76  CALCIUM 8.8* 8.3*   GFR: Estimated Creatinine Clearance: 71.4 mL/min (by C-G formula based on SCr of 0.8 mg/dL). Liver Function Tests: No results for input(s): AST, ALT, ALKPHOS, BILITOT, PROT, ALBUMIN in the last 168 hours. No results for input(s): LIPASE, AMYLASE in the last 168 hours. No results for input(s): AMMONIA in the last 168 hours. Coagulation Profile: No results for input(s): INR, PROTIME in the last 168 hours. Cardiac Enzymes:  Recent Labs Lab 04/30/16 1730 04/30/16 2040 05/01/16 0006 05/01/16 0453  TROPONINI <0.03 <0.03 <0.03 <0.03   BNP (last 3 results) No results for input(s): PROBNP in the last 8760 hours. HbA1C: No results for input(s): HGBA1C in the last 72 hours. CBG: No results for input(s): GLUCAP in the last 168 hours. Lipid Profile:  Recent Labs  05/01/16 0453  CHOL 169  HDL 33*  LDLCALC 76  TRIG 298*  CHOLHDL  5.1   Thyroid Function Tests: No results for input(s): TSH, T4TOTAL, FREET4, T3FREE, THYROIDAB in the last 72 hours. Anemia Panel: No results for input(s): VITAMINB12, FOLATE, FERRITIN, TIBC, IRON, RETICCTPCT in the last 72 hours. Urine analysis:    Component Value Date/Time   COLORURINE YELLOW (A) 12/27/2015 1918   APPEARANCEUR HAZY (A) 12/27/2015 1918   LABSPEC 1.015 12/27/2015 1918   PHURINE 6.0 12/27/2015 1918   GLUCOSEU NEGATIVE 12/27/2015 1918   HGBUR 2+ (A) 12/27/2015 1918   BILIRUBINUR neg 01/01/2016 1200   KETONESUR NEGATIVE 12/27/2015 1918   PROTEINUR neg 01/01/2016 1200   PROTEINUR NEGATIVE 12/27/2015 1918   UROBILINOGEN 0.2 01/01/2016 1200   UROBILINOGEN 0.2 07/15/2014 1800   NITRITE neg 01/01/2016 1200   NITRITE NEGATIVE 12/27/2015 1918   LEUKOCYTESUR Negative 01/01/2016 1200     Sruti Ayllon M.D. [facility] 05/01/2016, 10:05 AM  Pager: [phone] Between 7am to 7pm - call Pager - [phone]  After 7pm go to [email or web address] - password [email or web address]  Call night coverage person covering after 7pm

## 2016-05-01 NOTE — Discharge Summary (Signed)
Physician Discharge Summary   Patient ID: Sharon [name] MRN: [id number] DOB/AGE: [birth year] 60 y.o.  Admit date: 04/30/2016 Discharge date: 05/01/2016  Primary Care Physician:  Ria Bush, MD  Discharge Diagnoses:    . atypical Chest pain . Crohn's disease ([place]) . GERD (gastroesophageal reflux disease) . HLD (hyperlipidemia) . HTN (hypertension) . Smoker . TIA (transient ischemic attack)   Consults:  Cardiology, Dr Judithe Modest   Recommendations for Outpatient Follow-up:   1. Please repeat CBC/BMET at next visit    DIET: heart healthy diet     Allergies:   Allergies  Allergen Reactions  . Infliximab Dermatitis  . Codeine Nausea And Vomiting  . Flagyl [Metronidazole] Nausea And Vomiting  . Hydrocodone Nausea And Vomiting  . Ibuprofen Other (See Comments)    Pt states that she is unable to take due to her Crohn's disease.  . Remeron [Mirtazapine] Other (See Comments)    Reaction:  Makes pt lethargic      DISCHARGE MEDICATIONS: Current Discharge Medication List    START taking these medications   Details  aspirin EC 81 MG tablet Take 1 tablet (81 mg total) by mouth daily. Over the counter Qty: 30 tablet, Refills: 3    nitroGLYCERIN (NITROSTAT) 0.4 MG SL tablet Place 1 tablet (0.4 mg total) under the tongue every 5 (five) minutes as needed for chest pain. Qty: 30 tablet, Refills: 12      CONTINUE these medications which have CHANGED   Details  pantoprazole (PROTONIX) 40 MG tablet Take 1 tablet (40 mg total) by mouth daily. Qty: 30 tablet, Refills: 3      CONTINUE these medications which have NOT CHANGED   Details  acetaminophen (TYLENOL) 500 MG tablet Take 1,000 mg by mouth every 6 (six) hours as needed for mild pain or headache.     acidophilus (RISAQUAD) CAPS capsule Take 1 capsule by mouth every evening.    amitriptyline (ELAVIL) 50 MG tablet Take 1.5 tablets (75 mg total) by mouth at bedtime. Qty: 45 tablet, Refills: 6    citalopram (CELEXA)  40 MG tablet Take 1 tablet (40 mg total) by mouth daily. Qty: 90 tablet, Refills: 3    clonazePAM (KLONOPIN) 0.5 MG tablet TAKE ONE TABLET BY MOUTH TWICE DAILY AS NEEDED Qty: 60 tablet, Refills: 0    diphenhydrAMINE (BENADRYL) 25 mg capsule Take 12.5 mg by mouth at bedtime as needed for sleep.    metoprolol (LOPRESSOR) 50 MG tablet Take 1 tablet (50 mg total) by mouth 2 (two) times daily. Qty: 60 tablet, Refills: 11    cyanocobalamin (,VITAMIN B-12,) 1000 MCG/ML injection Inject 1,000 mcg into the muscle every 30 (thirty) days.    vedolizumab (ENTYVIO) 300 MG injection Inject into the vein every 8 (eight) weeks.       STOP taking these medications     HYDROmorphone (DILAUDID) 2 MG tablet      ondansetron (ZOFRAN ODT) 4 MG disintegrating tablet          Brief H and P: For complete details please refer to admission H and P, but in briefPatient is a 60 year old female with history of hypertension, hyperlipidemia, obesity, smoking, prior TIA, Crohn's disease presented to ED with chest pain for last 4 days. Patient described the chest pain as intermittent, episodic midsternal radiating to left side and back, 5/10 in intensity associated with shortness of breath,nausea, vomiting otherwise no diaphoresis or palpitations or dizziness. Denied any hematemesis. She has GERD but feels the chest pain is somewhat different. Patient  reports that for last 4 days her BP has been running high, this morning was 180's/100's. D-dimer 0.47, troponin negative, CT angiogram of the chest negative for any PE or aortic dissection.   Hospital Course:   AtypicalChest pain:Risk factors included hypertension, hyperlipidemia, nicotine abuse, no prior cardiac workup - Troponin 3 negative, EKG with no acute ischemic changes, d-dimer negative - CT angiogram of the chest negative for PE or aortic dissection  - cardiology was consulted for possible stress test  - Continue aspirin, beta blocker. BP was much better  today and borderline low, hence discontinued amlodipine which was started inpatient.  - nuclear medicine stress test showed no reversible ischemia, EF 70%. She was cleared to be discharged home by cardiology.   HTN (hypertension) Urgency resolved - Continue beta blocker   Crohn's disease ([place]) - Currently stable, is on the patient is on entyvio, follows eaglegastroenterology  Smoker - Patient counseled on smoking cessation, place on nicotine patch  TIA (transient ischemic attack) - continue aspirin  GERD (gastroesophageal reflux disease) - Continue PPI  HLD (hyperlipidemia) - Patient is not on any statins, LDL 76, triglycerides 298, cholesterol 169  Anxiety - Continue Klonopin, Celexa  Hyperkalemia: replaced   Day of Discharge BP 130/72 (BP Location: Left Arm)   Pulse 61   Temp 98.6 F (37 C) (Oral)   Resp 20   Ht 5' 1"  (1.549 m)   Wt 75.9 kg (167 lb 4.8 oz)   SpO2 98%   BMI 31.61 kg/m   Physical Exam: General: Alert and awake oriented x3 not in any acute distress. HEENT: anicteric sclera, pupils reactive to light and accommodation CVS: S1-S2 clear no murmur rubs or gallops Chest: clear to auscultation bilaterally, no wheezing rales or rhonchi Abdomen: soft nontender, nondistended, normal bowel sounds Extremities: no cyanosis, clubbing or edema noted bilaterally Neuro: Cranial nerves II-XII intact, no focal neurological deficits   The results of significant diagnostics from this hospitalization (including imaging, microbiology, ancillary and laboratory) are listed below for reference.    LAB RESULTS: Basic Metabolic Panel:  Recent Labs Lab 04/30/16 1455 05/01/16 0620  NA 139 140  K 3.2* 3.4*  CL 109 109  CO2 22 24  GLUCOSE 108* 115*  BUN 5* 7  CREATININE 0.83 0.76  CALCIUM 8.8* 8.3*   Liver Function Tests: No results for input(s): AST, ALT, ALKPHOS, BILITOT, PROT, ALBUMIN in the last 168 hours. No results for input(s): LIPASE,  AMYLASE in the last 168 hours. No results for input(s): AMMONIA in the last 168 hours. CBC:  Recent Labs Lab 04/30/16 1455  WBC 7.4  HGB 12.5  HCT 38.1  MCV 91.6  PLT 252   Cardiac Enzymes:  Recent Labs Lab 05/01/16 0006 05/01/16 0453  TROPONINI <0.03 <0.03   BNP: Invalid input(s): POCBNP CBG: No results for input(s): GLUCAP in the last 168 hours.  Significant Diagnostic Studies:  Dg Chest 2 View  Result Date: 04/30/2016 CLINICAL DATA:  Acute chest pain and shortness of breath for 3 days. EXAM: CHEST  2 VIEW COMPARISON:  11/03/2015 and prior radiographs FINDINGS: The cardiomediastinal silhouette is unremarkable. Mild elevation of the right hemidiaphragm again noted. There is no evidence of focal airspace disease, pulmonary edema, suspicious pulmonary nodule/mass, pleural effusion, or pneumothorax. No acute bony abnormalities are identified. IMPRESSION: No active cardiopulmonary disease. Electronically Signed   By: Margarette Canada M.D.   On: 04/30/2016 15:47   Nm Myocar Multi W/spect W/wall Motion / Ef  Result Date: 05/01/2016 CLINICAL DATA:  Chest pain. Previous stroke. Smoker. Hypertension. EXAM: MYOCARDIAL IMAGING WITH SPECT (REST AND PHARMACOLOGIC-STRESS) GATED LEFT VENTRICULAR WALL MOTION STUDY LEFT VENTRICULAR EJECTION FRACTION TECHNIQUE: Standard myocardial SPECT imaging was performed after resting intravenous injection of 10 mCi Tc-72mtetrofosmin. Subsequently, intravenous infusion of Lexiscan was performed under the supervision of the Cardiology staff. At peak effect of the drug, 30 mCi Tc-939metrofosmin was injected intravenously and standard myocardial SPECT imaging was performed. Quantitative gated imaging was also performed to evaluate left ventricular wall motion, and estimate left ventricular ejection fraction. COMPARISON:  None. FINDINGS: Perfusion: No decreased activity in the left ventricle on stress imaging to suggest reversible ischemia or infarction. Wall Motion:  Normal left ventricular wall motion. No left ventricular dilation. Left Ventricular Ejection Fraction: 70 % End diastolic volume 70 ml End systolic volume 21 ml IMPRESSION: 1. No reversible ischemia or infarction. 2. Normal left ventricular wall motion. 3. Left ventricular ejection fraction 70% 4. Non invasive risk stratification*: Low *2012 Appropriate Use Criteria for Coronary Revascularization Focused Update: J Am Coll Cardiol. 209935;70(1):[email or web address] Electronically Signed   By: JoEarle Gell.D.   On: 05/01/2016 14:36   Ct Angio Chest Aorta W &/or Wo Contrast  Result Date: 04/30/2016 CLINICAL DATA:  Chest pain radiating to left side for 2 days ago. Crohn's disease. Personal history of cervical carcinoma. EXAM: CT ANGIOGRAPHY CHEST WITH CONTRAST TECHNIQUE: Multidetector CT imaging of the chest was performed using the standard protocol during bolus administration of intravenous contrast. Multiplanar CT image reconstructions and MIPs were obtained to evaluate the vascular anatomy. CONTRAST:  80 mL Isovue 370 COMPARISON:  None. FINDINGS: Cardiovascular: Normal heart size. No evidence pericardial effusion. Aortic atherosclerosis. No evidence of thoracic aortic dissection or aneurysm. Satisfactory opacification of pulmonary arteries noted, and no pulmonary emboli identified. Mediastinum/Lymph Nodes: No masses or pathologically enlarged lymph nodes identified. Lungs/Pleura: No pulmonary mass, infiltrate, or effusion. Upper abdomen: Diffuse hepatic steatosis. Musculoskeletal: No chest wall mass or suspicious bone lesions identified. Review of the MIP images confirms the above findings. IMPRESSION: No evidence of thoracic aortic dissection or aneurysm. Aortic atherosclerosis. No evidence of pulmonary embolism.  No active lung disease. Diffuse hepatic steatosis. Electronically Signed   By: JoEarle Gell.D.   On: 04/30/2016 20:37    2D ECHO:   Disposition and  Follow-up: Discharge Instructions    Diet - low sodium heart healthy    Complete by:  As directed   Increase activity slowly    Complete by:  As directed       DISPOSITION: home    [place]  JaRia BushMD. Schedule an appointment as soon as possible for a visit in 2 week(s).   Specialty:  Family Medicine Contact information: [phone]            Time spent on Discharge: 2564m   Signed:   Annelise Mccoy M.D. [facility] 05/01/2016, 3:23 PM Pager: [phone]

## 2016-05-01 NOTE — Consult Note (Signed)
CARDIOLOGY CONSULT NOTE   Patient ID: Sharon [name] MRN: [id number] DOB/AGE: 1956/03/29 60 y.o.  Admit date: 04/30/2016  Requesting Physician: Dr.Rai Primary Physician:   Ria Bush, MD Primary Cardiologist:  New Reason for Consultation:   Chest pain   HPI: Sharon [name] is a 60 y.o. female with a history of HTN, HLD, obesity, tobacco abuse, prior TIA, and chron's disease who presented to [facility] ED on 04/30/16 with elevated BP and chest pain.   She was admitted in 2013 for what was felt to possibly be [place]. She underwent cardiac cath which showed normal cors and LV function. Chest pain was felt to be non cardiac.  She was seen in the ER in 10/2015 for chest pain. She ruled out and her chest pain was felt to be atypical. Plan was for outpatient cardiology evaluation with Dr. Rockey Situ but she subsequently canceled this appointment.   She was in her usual state of health until this past Thursday when she noted worsening chest pain. She also noticed her BP was high at 189/98 yesterday prompting her to call her PCP. She reported chest pain and HTN and the told her to come to the ER.   She described the pain as a left sided sharp pain that radiates to her left axilla and shoulder associated with SOB and nausea. It is worse with walking and gets better with rest. The chest pain only lasts minutes and has completely resolved since they placed the NTG patch on admission. She is not very active at all and does not formally exercise She denies LE edema, orthopnea or PND. No palpitations, dizziness or syncope.   She has smoked since her teenage years with 20 something years of cessation. She has smoked a total of about 20 years about 1/3-1 PPD. She has a family history of CAD in her PGM and paternal aunt and cousin. Only her cousin has premature CAD with an event in her 42s or 21s.   Past Medical History:  Diagnosis Date  . Anxiety and depression   . Arthritis   . B12 deficiency    pernicious anemia - monthly b12 shots  . Cervical cancer ([place]) 12/2008   s/p hysterectomy  . Crohn's disease ([place]) 2000   h/o stenotic crohn's, active in TI s/p resection 2000, PPD neg ([facility] Dr. [name]); has been on cimzia, remicade, 6MP, entocort, now stable on Entyvio and 6MP ([facility])  . Genital warts   . GERD (gastroesophageal reflux disease)    severe, daily sxs if off PPI  . Hepatic steatosis 04/2011   diffuse on CT, 7m gallbladder polyp  . History of anemia    attributed to crohn's  . History of chicken pox   . History of syphilis 1980s  . HTN (hypertension)   . Migraines    and frequent other headaches (sinus or stress)  . Scalp psoriasis   . Sensorineural hearing loss of both ears 12/2012   high freq  . Takotsubo syndrome 07/2012   cath 08/01/12, normal coronaries, LVEF 65%  . TIA (transient ischemic attack) [facility]   [facility] (w/u negative - carotids, echo, TC doppler, and MRI normal)  . Tobacco abuse      Past Surgical History:  Procedure Laterality Date  . abd [place] 04/2011   diffuse hepatic steatosis, 555mpolyp in gallbaldder fundus, no stones, mod abd ath  . APPENDECTOMY  1998  . CARDIAC CATHETERIZATION  07/2012   normal LV fxn, widely patent coronaries  . CERVICAL BIOPSY  W/ LOOP ELECTRODE EXCISION  10/2008  . COLON RESECTION  10/2014   removal of scar tissue colon from prior surgery ([facility])  . COLONOSCOPY  10/2008   ileo-colonic anastomosis, ielocolonic crohn's  . COLONOSCOPY  05/2011   ileo-colonic anastomosis normal, normal mucosa  . COLONOSCOPY  10/15/2012   focal inflammation at anastomosis, no active crohn's, planning MR enterograph [name])  . COLONOSCOPY  09/2014   [facility]  . ESOPHAGOGASTRODUODENOSCOPY  05/2011   nl esophagus, gastritis, nl duodenum  . hospitalization  05/2010   TIA vs complex migraine - w/u negative - head CT, MRI/MRA, carotid dopplers, echo.  treated with ASA and tramadol  . LEFT  HEART CATHETERIZATION WITH CORONARY ANGIOGRAM N/A 08/01/2012   Procedure: LEFT HEART CATHETERIZATION WITH CORONARY ANGIOGRAM;  Surgeon: Sherren Mocha, MD;  Location: [facility] CATH LAB;  Service: Cardiovascular;  Laterality: N/A;  . RIGHT COLECTOMY  2000   crohn's disease, ileocecal resection  . SHOULDER ARTHROSCOPY W/ ROTATOR CUFF REPAIR Right 05/31/2012   [facility] Tamera Punt)  . US ECHOCARDIOGRAPHY  05/2010   nl LV fxn ,EF 60%  . VAGINAL HYSTERECTOMY  12/2008   [facility]VH/BSO    Allergies  Allergen Reactions  . Infliximab Dermatitis  . Codeine Nausea And Vomiting  . Flagyl [Metronidazole] Nausea And Vomiting  . Hydrocodone Nausea And Vomiting  . Ibuprofen Other (See Comments)    Pt states that she is unable to take due to her Crohn's disease.  Marland Kitchen Remeron [Mirtazapine] Other (See Comments)    Reaction:  Makes pt lethargic     I have reviewed the patient's current medications . acidophilus  1 capsule Oral QPM  . amitriptyline  75 mg Oral QHS  . aspirin EC  325 mg Oral Daily  . citalopram  40 mg Oral Daily  . enoxaparin (LOVENOX) injection  40 mg Subcutaneous Q24H  . metoprolol  50 mg Oral BID  . nicotine  21 mg Transdermal Daily  . nitroGLYCERIN  0.3 mg Transdermal Daily  . pantoprazole  40 mg Oral Daily     acetaminophen, clonazePAM, diphenhydrAMINE, gi cocktail, hydrALAZINE, morphine injection, ondansetron (ZOFRAN) IV  Prior to Admission medications   Medication Sig Start Date End Date Taking? Authorizing Provider  acetaminophen (TYLENOL) 500 MG tablet Take 1,000 mg by mouth every 6 (six) hours as needed for mild pain or headache.    Yes Historical Provider, MD  acidophilus (RISAQUAD) CAPS capsule Take 1 capsule by mouth every evening.   Yes Historical Provider, MD  amitriptyline (ELAVIL) 50 MG tablet Take 1.5 tablets (75 mg total) by mouth at bedtime. 11/09/15  Yes Ria Bush, MD  citalopram (CELEXA) 40 MG tablet Take 1 tablet (40 mg total) by mouth daily. 11/09/15  Yes Ria Bush, MD  clonazePAM (KLONOPIN) 0.5 MG tablet TAKE ONE TABLET BY MOUTH TWICE DAILY AS NEEDED Patient taking differently: TAKE ONE TABLET BY MOUTH at night 01/27/16  Yes Ria Bush, MD  diphenhydrAMINE (BENADRYL) 25 mg capsule Take 12.5 mg by mouth at bedtime as needed for sleep.   Yes Historical Provider, MD  metoprolol (LOPRESSOR) 50 MG tablet Take 1 tablet (50 mg total) by mouth 2 (two) times daily. 05/01/15  Yes Ria Bush, MD  cyanocobalamin (,VITAMIN B-12,) 1000 MCG/ML injection Inject 1,000 mcg into the muscle every 30 (thirty) days.    Historical Provider, MD  HYDROmorphone (DILAUDID) 2 MG tablet Take 1 tablet (2 mg total) by  mouth every 6 (six) hours as needed for severe pain. Patient not taking: Reported on 04/30/2016 12/28/15   Paulette Blanch, MD  ondansetron (ZOFRAN ODT) 4 MG disintegrating tablet Take 1 tablet (4 mg total) by mouth every 8 (eight) hours as needed for nausea or vomiting. Patient not taking: Reported on 04/30/2016 12/28/15   Paulette Blanch, MD  pantoprazole (PROTONIX) 20 MG tablet Take 1 tablet (20 mg total) by mouth daily. Patient not taking: Reported on 04/30/2016 11/09/15   Ria Bush, MD  vedolizumab (ENTYVIO) 300 MG injection Inject into the vein every 8 (eight) weeks.     Historical Provider, MD     Social History   Social History  . Marital status: Single    Spouse name: N/A  . Number of children: N/A  . Years of education: N/A   Occupational History  . Not on file.   Social History Main Topics  . Smoking status: Current Every Day Smoker    Packs/day: 0.33    Years: 10.00    Types: Cigarettes  . Smokeless tobacco: Never Used  . Alcohol use No     Comment: remote EtOh use  . Drug use: No     Comment: extensive drug use, remotely  . Sexual activity: Yes     Comment: HYST   Other Topics Concern  . 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 Daughter   . Mother   . Father   . Sister   . Paternal Aunt   . Paternal Uncle   . Neg Hx    Family History  Problem Relation Age of Onset  . Crohn's disease Daughter     crohn's, ulcerative colitis  . Ulcerative colitis Daughter   . Stroke Mother   . Hypertension Mother   . Hyperlipidemia Mother   . Hypertension Father   . Crohn's disease Father   . Crohn's disease Sister   . Cancer Sister     ovarian  . Crohn's disease Paternal Aunt   . Crohn's disease Paternal Uncle   . Diabetes Neg Hx     ROS:  Full 14 point review of systems complete and found to be negative unless listed above.  Physical Exam: Blood pressure (!) 98/51, pulse (!) 57, temperature 97.8 F (36.6 C), temperature source Oral, resp. rate 16, height 5' 1"  (1.549 m), weight 167 lb 4.8 oz (75.9 kg), SpO2 98 %.  General: Well developed, well nourished, female in no acute distress obese Head: Eyes PERRLA, No xanthomas.   Normocephalic and atraumatic, oropharynx without edema or exudate. Dentition:  Lungs: CTAB Heart: HRRR S1 S2, no rub/gallop, Heart regular rate and rhythm with S1, S2  murmur. pulses are 2+ extrem.   Neck: No carotid bruits. No lymphadenopathy.  No JVD. Abdomen: Bowel sounds present, abdomen soft and non-tender without masses or hernias noted. Msk:  No spine or cva tenderness. No weakness, no joint deformities or effusions. Extremities: No clubbing or cyanosis. NO LE  edema.  Neuro: Alert and oriented X 3. No focal deficits noted. Psych:  Good affect, responds appropriately Skin: No rashes or lesions noted.  Labs:  Lab Results  Component Value Date   WBC 7.4 04/30/2016   HGB 12.5 04/30/2016   HCT 38.1 04/30/2016   MCV 91.6 04/30/2016   PLT 252 04/30/2016   No results for  input(s): INR in the last 72 hours.  Recent Labs Lab 05/01/16 0620  NA 140  K 3.4*  CL 109  CO2 24  BUN 7  CREATININE 0.76  CALCIUM 8.3*  GLUCOSE 115*   Magnesium  Date Value Ref Range Status  06/10/2010 1.7 1.5 - 2.5 mg/dL Final    Recent Labs  04/30/16 1730 04/30/16 2040 05/01/16 0006 05/01/16 0453  TROPONINI <0.03 <0.03 <0.03 <0.03    Recent Labs  04/30/16 1500  TROPIPOC 0.00   No results found for: PROBNP Lab Results  Component Value Date   CHOL 169 05/01/2016   HDL 33 (L) 05/01/2016   LDLCALC 76 05/01/2016   TRIG 298 (H) 05/01/2016   Lab Results  Component Value Date   DDIMER 0.47 04/30/2016   Lipase  Date/Time Value Ref Range Status  12/27/2015 07:18 PM 22 11 - 51 U/L Final     Echo: none   ECG:  NSR HR 61 with no acute ST or TW changes  Radiology:  Dg Chest 2 View  Result Date: 04/30/2016 CLINICAL DATA:  Acute chest pain and shortness of breath for 3 days. EXAM: CHEST  2 VIEW COMPARISON:  11/03/2015 and prior radiographs FINDINGS: The cardiomediastinal silhouette is unremarkable. Mild elevation of the right hemidiaphragm again noted. There is no evidence of focal airspace disease, pulmonary edema, suspicious pulmonary nodule/mass, pleural effusion, or pneumothorax. No acute bony abnormalities are identified. IMPRESSION: No active cardiopulmonary disease. Electronically Signed   By: Margarette [place] M.D.   On: 04/30/2016 15:47   Ct Angio Chest Aorta W &/or Wo Contrast  Result Date: 04/30/2016 CLINICAL DATA:  Chest pain radiating to left side for 2 days ago. Crohn's disease. Personal history of cervical carcinoma. EXAM: CT ANGIOGRAPHY CHEST WITH CONTRAST TECHNIQUE: Multidetector CT imaging of the chest was performed using the standard protocol during bolus administration of intravenous contrast. Multiplanar CT image reconstructions and MIPs were obtained to evaluate the vascular anatomy. CONTRAST:  80 mL Isovue 370 COMPARISON:  None. FINDINGS: Cardiovascular:  Normal heart size. No evidence pericardial effusion. Aortic atherosclerosis. No evidence of thoracic aortic dissection or aneurysm. Satisfactory opacification of pulmonary arteries noted, and no pulmonary emboli identified. Mediastinum/Lymph Nodes: No masses or pathologically enlarged lymph nodes identified. Lungs/Pleura: No pulmonary mass, infiltrate, or effusion. Upper abdomen: Diffuse hepatic steatosis. Musculoskeletal: No chest wall mass or suspicious bone lesions identified. Review of the MIP images confirms the above findings. IMPRESSION: No evidence of thoracic aortic dissection or aneurysm. Aortic atherosclerosis. No evidence of pulmonary embolism.  No active lung disease. Diffuse hepatic steatosis. Electronically Signed   By: Earle Gell M.D.   On: 04/30/2016 20:37   Cath in 2013 Final Conclusions:   1. Widely patent coronary arteries 2. Normal LV function Recommendations: Suspect noncardiac chest pain.  ASSESSMENT AND PLAN:    Principal Problem:   Chest pain Active Problems:   HTN (hypertension)   Crohn's disease (HCC)   Smoker   TIA (transient ischemic attack)   GERD (gastroesophageal reflux disease)   HLD (hyperlipidemia)  Exertional chest pain: patient has had exertional chest pain that has been worsening since Thursday. She has ruled out for MI with negative cardiac enzymes and non acute ECG. She had a normal cardiac cath in 2013 with widely patent coronary arteries  and normal LV function. She does have RFs including longstanding inflammatory disease (Chron's), obesity, continued tobacco abuse, HTN and HLD. Will plan for inpatient stress testing today. If low risk she can be discharged home.   HTN: BP now with much better control, soft today  HLD: LDL 76 on no therapy. TG 298- ? If fasting  Tobacco abuse: counseled on cessation  Obesity: Body mass index is 31.61 kg/m.   Signed: Angelena Form, PA-C 05/01/2016 9:28 AM  Pager [phone]  Co-Sign MD   Patient seen and  examined with Nell Range, PA-C. We discussed all aspects of the encounter. I agree with the assessment and plan as stated above.  CP with fairly typical features. Fortunately troponin and ECG look good. Had normal cardiac cath in 2013 so risk of severe underlying CAD considerably lower. That said, given IBS and ongoing tobacco use likely need further risk stratification with Myoview either today or early this week as outpatient. Will try to schedule for today. Counseled on need for smoking cessation.   Renisha Cockrum,MD 10:36 AM

## 2016-05-03 ENCOUNTER — Telehealth: Payer: Self-pay

## 2016-05-03 NOTE — Telephone Encounter (Signed)
Per chart review tab pt seen [place] on 09/*02/17.

## 2016-05-03 NOTE — Telephone Encounter (Signed)
PLEASE NOTE: All timestamps contained within this report are represented as [place] Standard Time. CONFIDENTIALTY NOTICE: This fax transmission is intended only for the addressee. It contains information that is legally privileged, confidential or otherwise protected from use or disclosure. If you are not the intended recipient, you are strictly prohibited from reviewing, disclosing, copying using or disseminating any of this information or taking any action in reliance on or regarding this information. If you have received this fax in error, please notify us immediately by telephone so that we can arrange for its return to [place]. Phone: [phone], Toll-Free: [phone], Fax: [phone] Page: 1 of 2 Call Id: [id number] [place] Patient Name: Sharon [name] Gender: Female DOB: Nov 21, 1955 Age: 60 Y 87 M 6 D Return Phone Number: [phone] (Primary) Address: City/State/Zip:  Client [place] Night - Client Client Site [place] Physician Ria Bush - MD Contact Type Call Who Is Calling Patient / Member / Family / Caregiver Call Type Triage / Clinical Relationship To Patient Self Return Phone Number [phone] (Primary) Chief Complaint Blood Pressure High Reason for Call Symptomatic / Request for Health Information Initial Comment Caller states bp elevated, 184/96 PreDisposition Call Doctor Translation No Nurse Assessment Nurse: Leilani Merl, RN, Heather Date/Time (Eastern Time): 04/30/2016 9:54:41 AM Confirm and document reason for call. If symptomatic, describe symptoms. You must click the next button to save text entered. ---Caller states bp elevated, 184/96, caller states that it has been up for the last few days, she is weak, shakey and nauseated Has the patient traveled out of the country within the last 30 days? ---Not  Applicable Does the patient have any new or worsening symptoms? ---Yes Will a triage be completed? ---Yes Related visit to physician within the last 2 weeks? ---No Does the PT have any chronic conditions? (i.e. diabetes, asthma, etc.) ---Yes List chronic conditions. ---HTN, crohns, anxiety Is this a behavioral health or substance abuse call? ---No Guidelines Guideline Title Affirmed Question Affirmed Notes Nurse Date/Time (Eastern Time) High Blood Pressure [1] BP # 160 / 100 AND [2] cardiac or neurologic symptoms (e.g., chest pain, difficulty breathing, unsteady gait, blurred vision) Standifer, RN, Heather 04/30/2016 9:55:43 AM Disp. Time Eilene Ghazi Time) Disposition Final User 04/30/2016 9:59:09 AM Call Completed Standifer, RN, Nira Conn 04/30/2016 9:57:36 AM Go to ED Now Yes Standifer, RN, Heather PLEASE NOTE: All timestamps contained within this report are represented as [place] Standard Time. CONFIDENTIALTY NOTICE: This fax transmission is intended only for the addressee. It contains information that is legally privileged, confidential or otherwise protected from use or disclosure. If you are not the intended recipient, you are strictly prohibited from reviewing, disclosing, copying using or disseminating any of this information or taking any action in reliance on or regarding this information. If you have received this fax in error, please notify us immediately by telephone so that we can arrange for its return to [place]. Phone: [phone], Toll-Free: [phone], Fax: [phone] Page: 2 of 2 Call Id: [id number] Caller Understands: Yes Disagree/Comply: Comply Care Advice Given Per Guideline GO TO ED NOW: You need to be seen in the Emergency Department. Go to the ER at ___________ [place] now. Drive carefully. * Another adult should drive. CARE ADVICE given per High Blood Pressure (Adult) guideline. Referrals [facility] - ED

## 2016-05-07 ENCOUNTER — Emergency Department
Admission: EM | Admit: 2016-05-07 | Discharge: 2016-05-07 | Disposition: A | Discharge status: Home / Self Care | Payer: Self-pay | Attending: Emergency Medicine | Admitting: Emergency Medicine

## 2016-05-07 ENCOUNTER — Emergency Department: Payer: Self-pay

## 2016-05-07 DIAGNOSIS — Z79899 Other long term (current) drug therapy: Secondary | ICD-10-CM | POA: Insufficient documentation

## 2016-05-07 DIAGNOSIS — G43909 Migraine, unspecified, not intractable, without status migrainosus: Secondary | ICD-10-CM | POA: Insufficient documentation

## 2016-05-07 DIAGNOSIS — Z7982 Long term (current) use of aspirin: Secondary | ICD-10-CM | POA: Insufficient documentation

## 2016-05-07 DIAGNOSIS — F17210 Nicotine dependence, cigarettes, uncomplicated: Secondary | ICD-10-CM | POA: Insufficient documentation

## 2016-05-07 DIAGNOSIS — I10 Essential (primary) hypertension: Secondary | ICD-10-CM | POA: Insufficient documentation

## 2016-05-07 DIAGNOSIS — Z8673 Personal history of transient ischemic attack (TIA), and cerebral infarction without residual deficits: Secondary | ICD-10-CM | POA: Insufficient documentation

## 2016-05-07 DIAGNOSIS — Z8541 Personal history of malignant neoplasm of cervix uteri: Secondary | ICD-10-CM | POA: Insufficient documentation

## 2016-05-07 LAB — CBC WITH DIFFERENTIAL/PLATELET
BASOS ABS: 0.1 10*3/uL (ref 0–0.1)
BASOS PCT: 1 %
EOS PCT: 3 %
Eosinophils Absolute: 0.2 10*3/uL (ref 0–0.7)
HEMATOCRIT: 40 % (ref 35.0–47.0)
Hemoglobin: 13.7 g/dL (ref 12.0–16.0)
LYMPHS PCT: 30 %
Lymphs Abs: 2.1 10*3/uL (ref 1.0–3.6)
MCH: 31.1 pg (ref 26.0–34.0)
MCHC: 34.2 g/dL (ref 32.0–36.0)
MCV: 90.8 fL (ref 80.0–100.0)
Monocytes Absolute: 0.5 10*3/uL (ref 0.2–0.9)
Monocytes Relative: 7 %
NEUTROS ABS: 4.1 10*3/uL (ref 1.4–6.5)
Neutrophils Relative %: 59 %
PLATELETS: 224 10*3/uL (ref 150–440)
RBC: 4.4 MIL/uL (ref 3.80–5.20)
RDW: 14.4 % (ref 11.5–14.5)
WBC: 7 10*3/uL (ref 3.6–11.0)

## 2016-05-07 LAB — COMPREHENSIVE METABOLIC PANEL
ALT: 30 U/L (ref 14–54)
ANION GAP: 7 (ref 5–15)
AST: 41 U/L (ref 15–41)
Albumin: 4.2 g/dL (ref 3.5–5.0)
Alkaline Phosphatase: 112 U/L (ref 38–126)
BILIRUBIN TOTAL: 0.5 mg/dL (ref 0.3–1.2)
BUN: 6 mg/dL (ref 6–20)
CO2: 24 mmol/L (ref 22–32)
Calcium: 8.7 mg/dL — ABNORMAL LOW (ref 8.9–10.3)
Chloride: 106 mmol/L (ref 101–111)
Creatinine, Ser: 0.79 mg/dL (ref 0.44–1.00)
GFR calc Af Amer: >60 mL/min (ref >60)
Glucose, Bld: 85 mg/dL (ref 65–99)
POTASSIUM: 3.7 mmol/L (ref 3.5–5.1)
Sodium: 137 mmol/L (ref 135–145)
TOTAL PROTEIN: 7.6 g/dL (ref 6.5–8.1)

## 2016-05-07 MED ORDER — DIPHENHYDRAMINE HCL 25 MG PO CAPS
25.0000 mg | ORAL_CAPSULE | Freq: Once | ORAL | Status: AC
  Administered 2016-05-07: 25 mg via ORAL
  Filled 2016-05-07: qty 1

## 2016-05-07 MED ORDER — METOCLOPRAMIDE HCL 10 MG PO TABS
10.0000 mg | ORAL_TABLET | Freq: Three times a day (TID) | ORAL | 1 refills | Status: DC
Start: 2016-05-07 — End: 2016-12-27

## 2016-05-07 MED ORDER — BUTALBITAL-APAP-CAFFEINE 50-325-40 MG PO TABS: 1.0000 | ORAL_TABLET | Freq: Four times a day (QID) | ORAL | 0 refills | Status: AC | PRN

## 2016-05-07 MED ORDER — BUTALBITAL-APAP-CAFFEINE 50-325-40 MG PO TABS
2.0000 | ORAL_TABLET | Freq: Once | ORAL | Status: AC
  Administered 2016-05-07: 2 via ORAL
  Filled 2016-05-07: qty 2

## 2016-05-07 MED ORDER — METOCLOPRAMIDE HCL 10 MG PO TABS
10.0000 mg | ORAL_TABLET | Freq: Once | ORAL | Status: AC
  Administered 2016-05-07: 10 mg via ORAL
  Filled 2016-05-07: qty 1

## 2016-05-07 NOTE — ED Notes (Signed)
Patient was left with cup of water to drink per patient's request.

## 2016-05-07 NOTE — ED Notes (Signed)
Patient states she was dropped off at the hospital and will not be driving.

## 2016-05-07 NOTE — ED Triage Notes (Signed)
Pt reports headache frontal that radiates to temples bilaterally with light sensitivity since yesterday. Low grade fever 99.3 c/o right ear pain and left jaw pain. Hx of Migraines.

## 2016-05-07 NOTE — ED Provider Notes (Signed)
[facility] Emergency Department Provider Note ____________________________________________  Time seen: Approximately 3:55 PM  I have reviewed the triage vital signs and the nursing notes.   HISTORY  Chief Complaint Headache   HPI Sharon [name] is a 60 y.o. female who presents to the emergency department for evaluation of a headache.  Location: Bilateral temporal and frontal Similar to previous headaches: yes Duration: 6 weeks, worse over past 2 days. TIMING: constant SEVERITY:9/10 QUALITY: throbbing/sharp  CONTEXT: No known exposures; photophobia and phonophobia MODIFYING FACTORS: No relief with tylenol and benadryl ASSOCIATED SYMPTOMS: nausea Past Medical History:  Diagnosis Date  . Anxiety and depression   . Arthritis   . B12 deficiency    pernicious anemia - monthly b12 shots  . Cervical cancer ([place]) 12/2008   s/p hysterectomy  . Crohn's disease ([place]) 2000   h/o stenotic crohn's, active in TI s/p resection 2000, PPD neg ([facility] Dr. [name]); has been on cimzia, remicade, 6MP, entocort, now stable on Entyvio and 6MP ([facility] at [place])  . Genital warts   . GERD (gastroesophageal reflux disease)    severe, daily sxs if off PPI  . Hepatic steatosis 04/2011   diffuse on CT, 54m gallbladder polyp  . History of anemia    attributed to crohn's  . History of chicken pox   . History of syphilis 1980s  . HTN (hypertension)   . Migraines    and frequent other headaches (sinus or stress)  . Scalp psoriasis   . Sensorineural hearing loss of both ears 12/2012   high freq  . Takotsubo syndrome 07/2012   cath 08/01/12, normal coronaries, LVEF 65%  . TIA (transient ischemic attack) [facility]2010   [facility] (w/u negative - carotids, echo, TC doppler, and MRI normal)  . Tobacco abuse     Patient Active Problem List   Diagnosis Date Noted  . Atypical chest pain 04/30/2016  . Hematuria 01/01/2016  . Hyperglycemia 05/09/2013   . HLD (hyperlipidemia) 02/12/2013  . Diarrhea 12/18/2012  . Right knee pain 11/13/2012  . Takotsubo syndrome   . Chest pain 08/02/2012  . Bilateral hearing loss 06/27/2012  . GERD (gastroesophageal reflux disease)   . Skin rash 04/27/2012  . Right shoulder pain 03/13/2012  . History of pneumonia 01/18/2012  . Headache(784.0) 10/13/2011  . Healthcare maintenance 08/16/2011  . Leukopenia 08/16/2011  . Vitamin B12 deficiency   . HTN (hypertension)   . Migraines   . [place]   . Anxiety and depression   . Smoker   . TIA (transient ischemic attack)   . Hepatic steatosis 04/30/2011    Past Surgical History:  Procedure Laterality Date  . abd UKorea 04/2011   diffuse hepatic steatosis, 526mpolyp in gallbaldder fundus, no stones, mod abd ath  . APPENDECTOMY  1998  . CARDIAC CATHETERIZATION  07/2012   normal LV fxn, widely patent coronaries  . CERVICAL BIOPSY  W/ LOOP ELECTRODE EXCISION  10/2008  . COLON RESECTION  10/2014   [facility] . COLONOSCOPY  10/2008   ileo-colonic anastomosis, ielocolonic crohn's  . COLONOSCOPY  05/2011   ileo-colonic anastomosis normal, normal mucosa  . COLONOSCOPY  10/15/2012   focal inflammation at anastomosis, no active crohn's, planning MR enterograph ([facility] . COLONOSCOPY  09/2014   [facility]  . ESOPHAGOGASTRODUODENOSCOPY  05/2011   nl esophagus, gastritis, nl duodenum  . hospitalization  05/2010  TIA vs complex migraine - w/u negative - head CT, MRI/MRA, carotid dopplers, echo.  treated with ASA and tramadol  . LEFT HEART CATHETERIZATION WITH CORONARY ANGIOGRAM N/A 08/01/2012   Procedure: LEFT HEART CATHETERIZATION WITH CORONARY ANGIOGRAM;  Surgeon: Sherren Mocha, MD;  Location: [facility] CATH LAB;  Service: Cardiovascular;  Laterality: N/A;  . RIGHT COLECTOMY  2000   crohn's disease, ileocecal resection  . SHOULDER ARTHROSCOPY W/ ROTATOR CUFF REPAIR Right 05/31/2012   [facility]  Tamera Punt)  . US ECHOCARDIOGRAPHY  05/2010   nl LV fxn ,EF 60%  . VAGINAL HYSTERECTOMY  12/2008   LAVH/BSO    Prior to Admission medications   Medication Sig Start Date End Date Taking? Authorizing Provider  acetaminophen (TYLENOL) 500 MG tablet Take 1,000 mg by mouth every 6 (six) hours as needed for mild pain or headache.     Historical Provider, MD  acidophilus (RISAQUAD) CAPS capsule Take 1 capsule by mouth every evening.    Historical Provider, MD  amitriptyline (ELAVIL) 50 MG tablet Take 1.5 tablets (75 mg total) by mouth at bedtime. 11/09/15   Ria Bush, MD  aspirin EC 81 MG tablet Take 1 tablet (81 mg total) by mouth daily. Over the counter 05/01/16   Ripudeep Krystal Eaton, MD  butalbital-acetaminophen-caffeine (FIORICET) [phone] MG tablet Take 1-2 tablets by mouth every 6 (six) hours as needed for headache. 05/07/16 05/07/17  Victorino Dike, FNP  citalopram (CELEXA) 40 MG tablet Take 1 tablet (40 mg total) by mouth daily. 11/09/15   Ria Bush, MD  clonazePAM (KLONOPIN) 0.5 MG tablet TAKE ONE TABLET BY MOUTH TWICE DAILY AS NEEDED Patient taking differently: TAKE ONE TABLET BY MOUTH at night 01/27/16   Ria Bush, MD  cyanocobalamin (,VITAMIN B-12,) 1000 MCG/ML injection Inject 1,000 mcg into the muscle every 30 (thirty) days.    Historical Provider, MD  diphenhydrAMINE (BENADRYL) 25 mg capsule Take 12.5 mg by mouth at bedtime as needed for sleep.    Historical Provider, MD  metoCLOPramide (REGLAN) 10 MG tablet Take 1 tablet (10 mg total) by mouth 3 (three) times daily with meals. 05/07/16 05/07/17  Victorino Dike, FNP  metoprolol (LOPRESSOR) 50 MG tablet Take 1 tablet (50 mg total) by mouth 2 (two) times daily. 05/01/15   Ria Bush, MD  nitroGLYCERIN (NITROSTAT) 0.4 MG SL tablet Place 1 tablet (0.4 mg total) under the tongue every 5 (five) minutes as needed for chest pain. 05/01/16   Ripudeep Krystal Eaton, MD  pantoprazole (PROTONIX) 40 MG tablet Take 1 tablet (40 mg total) by mouth  daily. 05/01/16   Ripudeep Krystal Eaton, MD  vedolizumab (ENTYVIO) 300 MG injection Inject into the vein every 8 (eight) weeks.     Historical Provider, MD    Allergies Infliximab; Codeine; Flagyl [metronidazole]; Hydrocodone; Ibuprofen; and Remeron [mirtazapine]  Family History  Problem Relation Age of Onset  . Crohn's disease Daughter     crohn's, ulcerative colitis  . Ulcerative colitis Daughter   . Stroke Mother   . Hypertension Mother   . Hyperlipidemia Mother   . Hypertension Father   . Crohn's disease Father   . Crohn's disease Sister   . Cancer Sister     ovarian  . Crohn's disease Paternal Aunt   . Crohn's disease Paternal Uncle   . Diabetes Neg Hx     Social History Social History  Substance Use Topics  . Smoking status: Current Every Day Smoker    Packs/day: 0.33    Years: 10.00  Types: Cigarettes  . Smokeless tobacco: Never Used  . Alcohol use No     Comment: remote EtOh use    Review of Systems Constitutional: "Low grade" fever with some chills. Eyes: No visual changes. ENT: No sore throat. Positive for bilateral otalgia. Respiratory: Denies shortness of breath. Gastrointestinal: No abdominal pain.  Positive for nausea, no vomiting.  No diarrhea.  No constipation. Musculoskeletal: Negative for pain. Skin: Negative for rash. Neurological:Positive for headache, Negative for focal weakness or numbness. Positive for occasional dizziness ___________________________________________   PHYSICAL EXAM:  VITAL SIGNS: ED Triage Vitals  Enc Vitals Group     BP 05/07/16 1537 (!) 152/61     Pulse Rate 05/07/16 1537 69     Resp 05/07/16 1537 20     Temp 05/07/16 1537 99.3 F (37.4 C)     Temp Source 05/07/16 1537 Oral     SpO2 05/07/16 1537 97 %     Weight 05/07/16 1541 167 lb (75.8 kg)     Height 05/07/16 1541 5' 1"  (1.549 m)     Head Circumference --      Peak Flow --      Pain Score 05/07/16 1541 8     Pain Loc --      Pain Edu? --      Excl. in [place]? --      Constitutional: Alert and oriented. Well appearing and in no acute distress. Eyes: Conjunctivae are normal. PERRL. EOMI. No evidence of papilledema on limited exam. Head: Atraumatic. Nose: No congestion/rhinnorhea. Mouth/Throat: Mucous membranes are moist.  Oropharynx non-erythematous. Neck: No stridor. No meningismus.   Cardiovascular: Normal rate, regular rhythm. Grossly normal heart sounds.  Good peripheral circulation. Respiratory: Normal respiratory effort.  No retractions. Lungs CTAB. Gastrointestinal: Soft and nontender. No distention. No abdominal bruits. No CVA tenderness. Musculoskeletal: No lower extremity tenderness nor edema.  No joint effusions. Neurologic:  Normal speech and language. No gross focal neurologic deficits are appreciated. No gait instability. Cranial nerves: 2-10 normal as tested. Cerebellar: Abnormal Romberg--unable to maintain balance with eyes closed and palms toward the ceiling. Normal gait. Sensorimotor: No aphasia, pronator drift, clonus, sensory loss or abnormal reflexes.  Skin:  Skin is warm, dry and intact. No rash noted. Psychiatric: Mood and affect are normal. Speech and behavior are normal. Normal thought process and cognition.  ____________________________________________   LABS (all labs ordered are listed, but only abnormal results are displayed)  Labs Reviewed  COMPREHENSIVE METABOLIC PANEL - Abnormal; Notable for the following:       Result Value   Calcium 8.7 (*)    All other components within normal limits  CBC WITH DIFFERENTIAL/PLATELET   ____________________________________________  EKG   ____________________________________________  RADIOLOGY  CT head negative for acute abnormality. Sinuses unremarkable. ____________________________________________   PROCEDURES  Procedure(s) performed: None  Critical Care performed: No  ____________________________________________   INITIAL IMPRESSION / ASSESSMENT AND PLAN / ED  COURSE  Clinical Course  Comment By Time  Fioricet, reglan, and benadryl po given in the ER. Last images of the brain were performed in 2013. MRI negative for concerning findings per radiology. Will get basic labs and CT without contrast due to imbalance on [place] and report of onset of headache 6 weeks ago. Victorino Dike, FNP 09/09 1625    Pertinent labs & imaging results that were available during my care of the patient were reviewed by me and considered in my medical decision making (see chart for details). After fioricet, reglan, and benadryl, patient  had near complete relief of headache. Prescriptions for fioricet and reglan  will be prescribed today. Patient was advised to follow up with the primary care provider for symptoms that are not relieved or improved over the next 24 hours. Also advised to return to the emergency department for symptoms that change or worsen if unable to schedule an appointment. ____________________________________________   FINAL CLINICAL IMPRESSION(S) / ED DIAGNOSES  Final diagnoses:  Migraine without status migrainosus, not intractable, unspecified migraine type      Victorino Dike, FNP 05/07/16 1834    Harvest Dark, MD 05/07/16 2256

## 2016-05-10 ENCOUNTER — Emergency Department
Admission: EM | Admit: 2016-05-10 | Discharge: 2016-05-10 | Disposition: A | Discharge status: Home / Self Care | Payer: Self-pay | Attending: Emergency Medicine | Admitting: Emergency Medicine

## 2016-05-10 ENCOUNTER — Encounter: Payer: Self-pay | Admitting: Emergency Medicine

## 2016-05-10 DIAGNOSIS — Z79899 Other long term (current) drug therapy: Secondary | ICD-10-CM | POA: Insufficient documentation

## 2016-05-10 DIAGNOSIS — Z8541 Personal history of malignant neoplasm of cervix uteri: Secondary | ICD-10-CM | POA: Insufficient documentation

## 2016-05-10 DIAGNOSIS — R110 Nausea: Secondary | ICD-10-CM | POA: Insufficient documentation

## 2016-05-10 DIAGNOSIS — Z7982 Long term (current) use of aspirin: Secondary | ICD-10-CM | POA: Insufficient documentation

## 2016-05-10 DIAGNOSIS — I10 Essential (primary) hypertension: Secondary | ICD-10-CM | POA: Insufficient documentation

## 2016-05-10 DIAGNOSIS — R519 Headache, unspecified: Secondary | ICD-10-CM

## 2016-05-10 DIAGNOSIS — F17210 Nicotine dependence, cigarettes, uncomplicated: Secondary | ICD-10-CM | POA: Insufficient documentation

## 2016-05-10 DIAGNOSIS — R51 Headache: Secondary | ICD-10-CM | POA: Insufficient documentation

## 2016-05-10 LAB — CBC
HCT: 40.6 % (ref 35.0–47.0)
Hemoglobin: 14.1 g/dL (ref 12.0–16.0)
MCH: 31.1 pg (ref 26.0–34.0)
MCHC: 34.7 g/dL (ref 32.0–36.0)
MCV: 89.8 fL (ref 80.0–100.0)
PLATELETS: 236 10*3/uL (ref 150–440)
RBC: 4.52 MIL/uL (ref 3.80–5.20)
RDW: 14.1 % (ref 11.5–14.5)
WBC: 6.6 10*3/uL (ref 3.6–11.0)

## 2016-05-10 LAB — BASIC METABOLIC PANEL
ANION GAP: 7 (ref 5–15)
BUN: <5 mg/dL — ABNORMAL LOW (ref 6–20)
CALCIUM: 8.9 mg/dL (ref 8.9–10.3)
CO2: 24 mmol/L (ref 22–32)
CREATININE: 0.78 mg/dL (ref 0.44–1.00)
Chloride: 105 mmol/L (ref 101–111)
Glucose, Bld: 92 mg/dL (ref 65–99)
Potassium: 3.1 mmol/L — ABNORMAL LOW (ref 3.5–5.1)
SODIUM: 136 mmol/L (ref 135–145)

## 2016-05-10 MED ORDER — ONDANSETRON HCL 4 MG/2ML IJ SOLN
4.0000 mg | Freq: Once | INTRAMUSCULAR | Status: AC
  Administered 2016-05-10: 4 mg via INTRAVENOUS
  Filled 2016-05-10: qty 2

## 2016-05-10 MED ORDER — MAGNESIUM SULFATE 2 GM/50ML IV SOLN
2.0000 g | Freq: Once | INTRAVENOUS | Status: AC
  Administered 2016-05-10: 2 g via INTRAVENOUS
  Filled 2016-05-10: qty 50

## 2016-05-10 MED ORDER — PROCHLORPERAZINE EDISYLATE 5 MG/ML IJ SOLN
10.0000 mg | Freq: Once | INTRAMUSCULAR | Status: AC
Start: 2016-05-10 — End: 2016-05-10
  Administered 2016-05-10: 10 mg via INTRAVENOUS
  Filled 2016-05-10: qty 2

## 2016-05-10 MED ORDER — PROCHLORPERAZINE MALEATE 10 MG PO TABS
10.0000 mg | ORAL_TABLET | Freq: Once | ORAL | Status: DC
  Filled 2016-05-10 (×2): qty 1

## 2016-05-10 MED ORDER — POTASSIUM CHLORIDE CRYS ER 20 MEQ PO TBCR
40.0000 meq | EXTENDED_RELEASE_TABLET | Freq: Once | ORAL | Status: AC
  Administered 2016-05-10: 40 meq via ORAL
  Filled 2016-05-10: qty 2

## 2016-05-10 NOTE — ED Provider Notes (Signed)
[facility] Emergency Department Provider Note    ____________________________________________   I have reviewed the triage vital signs and the nursing notes.   HISTORY  Chief Complaint Hypertension   History limited by: Not Limited   HPI Sharon [name] is a 60 y.o. female who presents to the emergency department today because of concern for headache. The patient states that she has been having headaches for a few weeks. They have been getting worse for the past week or so. She describes the pain as severe. It is located behind her forehead. It is accompanied by nausea and blurred vision. Was seen in the emergency department recently and had a head CT performed which did not show any concerning findings.      Past Medical History:  Diagnosis Date  . Anxiety and depression   . Arthritis   . B12 deficiency    pernicious anemia - monthly b12 shots  . Cervical cancer ([place]) 12/2008   s/p hysterectomy  . Crohn's disease ([place]) 2000   h/o stenotic crohn's, active in TI s/p resection 2000, PPD neg ([facility] Dr. [name]); has been on cimzia, remicade, 6MP, entocort, now stable on Entyvio and 6MP ([facility] at [place])  . Genital warts   . GERD (gastroesophageal reflux disease)    severe, daily sxs if off PPI  . Hepatic steatosis 04/2011   diffuse on CT, 34m gallbladder polyp  . History of anemia    attributed to crohn's  . History of chicken pox   . History of syphilis 1980s  . HTN (hypertension)   . Migraines    and frequent other headaches (sinus or stress)  . Scalp psoriasis   . Sensorineural hearing loss of both ears 12/2012   high freq  . Takotsubo syndrome 07/2012   cath 08/01/12, normal coronaries, LVEF 65%  . [facility]transient ischemic attack[facility]2010   [facility] (w/u negative - carotids, echo, TC doppler, and MRI normal)  . Tobacco abuse     Patient Active Problem List   Diagnosis Date Noted  . Atypical chest pain  04/30/2016  . Hematuria 01/01/2016  . Hyperglycemia 05/09/2013  . HLD (hyperlipidemia) 02/12/2013  . Diarrhea 12/18/2012  . Right knee pain 11/13/2012  . Takotsubo syndrome   . Chest pain 08/02/2012  . Bilateral hearing loss 06/27/2012  . GERD (gastroesophageal reflux disease)   . Skin rash 04/27/2012  . Right shoulder pain 03/13/2012  . History of pneumonia 01/18/2012  . Headache(784.0) 10/13/2011  . Healthcare maintenance 08/16/2011  . Leukopenia 08/16/2011  . Vitamin B12 deficiency   . HTN (hypertension)   . Migraines   . [place]   . Anxiety and depression   . Smoker   . TIA (transient ischemic attack)   . Hepatic steatosis 04/30/2011    Past Surgical History:  Procedure Laterality Date  . abd UKorea 04/2011   diffuse hepatic steatosis, 533mpolyp in gallbaldder fundus, no stones, mod abd ath  . APPENDECTOMY  1998  . CARDIAC CATHETERIZATION  07/2012   normal LV fxn, widely patent coronaries  . CERVICAL BIOPSY  W/ LOOP ELECTRODE EXCISION  10/2008  . COLON RESECTION  10/2014   [facility] . COLONOSCOPY  10/2008   ileo-colonic anastomosis, ielocolonic crohn's  . COLONOSCOPY  05/2011   ileo-colonic anastomosis normal, normal mucosa  . COLONOSCOPY  10/15/2012   focal inflammation at anastomosis, no active  crohn's, planning MR enterograph [name])  . COLONOSCOPY  09/2014   [facility]  . ESOPHAGOGASTRODUODENOSCOPY  05/2011   nl esophagus, gastritis, nl duodenum  . hospitalization  05/2010   TIA vs complex migraine - w/u negative - head CT, MRI/MRA, carotid dopplers, echo.  treated with ASA and tramadol  . LEFT HEART CATHETERIZATION WITH CORONARY ANGIOGRAM N/A 08/01/2012   Procedure: LEFT HEART CATHETERIZATION WITH CORONARY ANGIOGRAM;  Surgeon: Sherren Mocha, MD;  Location: [facility] CATH LAB;  Service: Cardiovascular;  Laterality: N/A;  . RIGHT COLECTOMY  2000   crohn's disease, ileocecal resection  . SHOULDER  ARTHROSCOPY W/ ROTATOR CUFF REPAIR Right 05/31/2012   [facility] Tamera Punt)  . US ECHOCARDIOGRAPHY  05/2010   nl LV fxn ,EF 60%  . VAGINAL HYSTERECTOMY  12/2008   LAVH/BSO    Prior to Admission medications   Medication Sig Start Date End Date Taking? Authorizing Provider  acetaminophen (TYLENOL) 500 MG tablet Take 1,000 mg by mouth every 6 (six) hours as needed for mild pain or headache.     Historical Provider, MD  acidophilus (RISAQUAD) CAPS capsule Take 1 capsule by mouth every evening.    Historical Provider, MD  amitriptyline (ELAVIL) 50 MG tablet Take 1.5 tablets (75 mg total) by mouth at bedtime. 11/09/15   Ria Bush, MD  aspirin EC 81 MG tablet Take 1 tablet (81 mg total) by mouth daily. Over the counter 05/01/16   Ripudeep Krystal Eaton, MD  butalbital-acetaminophen-caffeine (FIORICET) [phone] MG tablet Take 1-2 tablets by mouth every 6 (six) hours as needed for headache. 05/07/16 05/07/17  Victorino Dike, FNP  citalopram (CELEXA) 40 MG tablet Take 1 tablet (40 mg total) by mouth daily. 11/09/15   Ria Bush, MD  clonazePAM (KLONOPIN) 0.5 MG tablet TAKE ONE TABLET BY MOUTH TWICE DAILY AS NEEDED Patient taking differently: TAKE ONE TABLET BY MOUTH at night 01/27/16   Ria Bush, MD  cyanocobalamin (,VITAMIN B-12,) 1000 MCG/ML injection Inject 1,000 mcg into the muscle every 30 (thirty) days.    Historical Provider, MD  diphenhydrAMINE (BENADRYL) 25 mg capsule Take 12.5 mg by mouth at bedtime as needed for sleep.    Historical Provider, MD  metoCLOPramide (REGLAN) 10 MG tablet Take 1 tablet (10 mg total) by mouth 3 (three) times daily with meals. 05/07/16 05/07/17  Victorino Dike, FNP  metoprolol (LOPRESSOR) 50 MG tablet Take 1 tablet (50 mg total) by mouth 2 (two) times daily. 05/01/15   Ria Bush, MD  nitroGLYCERIN (NITROSTAT) 0.4 MG SL tablet Place 1 tablet (0.4 mg total) under the tongue every 5 (five) minutes as needed for chest pain. 05/01/16   Ripudeep Krystal Eaton, MD   pantoprazole (PROTONIX) 40 MG tablet Take 1 tablet (40 mg total) by mouth daily. 05/01/16   Ripudeep Krystal Eaton, MD  vedolizumab (ENTYVIO) 300 MG injection Inject into the vein every 8 (eight) weeks.     Historical Provider, MD    Allergies Infliximab; Codeine; Flagyl [metronidazole]; Hydrocodone; Ibuprofen; and Remeron [mirtazapine]  Family History  Problem Relation Age of Onset  . Crohn's disease Daughter     crohn's, ulcerative colitis  . Ulcerative colitis Daughter   . Stroke Mother   . Hypertension Mother   . Hyperlipidemia Mother   . Hypertension Father   . Crohn's disease Father   . Crohn's disease Sister   . Cancer Sister     ovarian  . Crohn's disease Paternal Aunt   . Crohn's disease Paternal Uncle   . Diabetes Neg Hx  Social History Social History  Substance Use Topics  . Smoking status: Current Every Day Smoker    Packs/day: 0.33    Years: 10.00    Types: Cigarettes  . Smokeless tobacco: Never Used  . Alcohol use No     Comment: remote EtOh use    Review of Systems  Constitutional: Negative for fever. Cardiovascular: Negative for chest pain. Respiratory: Negative for shortness of breath. Gastrointestinal: Positive for nausea.  Genitourinary: Negative for dysuria. Musculoskeletal: Negative for back pain. Skin: Negative for rash. Neurological: Positive for headache   10-point ROS otherwise negative.  ____________________________________________   PHYSICAL EXAM:  VITAL SIGNS: ED Triage Vitals  Enc Vitals Group     BP 05/10/16 2012 (!) 161/103     Pulse Rate 05/10/16 2012 85     Resp --      Temp 05/10/16 2012 98 F (36.7 C)     Temp Source 05/10/16 2012 Oral     SpO2 05/10/16 2012 100 %     Weight 05/10/16 2013 165 lb (74.8 kg)     Height 05/10/16 2013 5' 1"  (1.549 m)     Head Circumference --      Peak Flow --      Pain Score 05/10/16 2013 8   Constitutional: Alert and oriented. Well appearing and in no distress. Eyes: Conjunctivae are  normal. Normal extraocular movements. ENT   Head: Normocephalic and atraumatic.   Nose: No congestion/rhinnorhea.   Mouth/Throat: Mucous membranes are moist.   Neck: No stridor. Hematological/Lymphatic/Immunilogical: No cervical lymphadenopathy. Cardiovascular: Normal rate, regular rhythm.  No murmurs, rubs, or gallops. Respiratory: Normal respiratory effort without tachypnea nor retractions. Breath sounds are clear and equal bilaterally. No wheezes/rales/rhonchi. Gastrointestinal: Soft and nontender. No distention.  Genitourinary: Deferred Musculoskeletal: Normal range of motion in all extremities. No lower extremity edema. Neurologic:  Normal speech and language. No gross focal neurologic deficits are appreciated.  Skin:  Skin is warm, dry and intact. No rash noted. Psychiatric: Mood and affect are normal. Speech and behavior are normal. Patient exhibits appropriate insight and judgment.  ____________________________________________    LABS (pertinent positives/negatives)  Labs Reviewed  BASIC METABOLIC PANEL - Abnormal; Notable for the following:       Result Value   Potassium 3.1 (*)    BUN <5 (*)    All other components within normal limits  CBC     ____________________________________________   EKG  None  ____________________________________________    RADIOLOGY  None  ____________________________________________   PROCEDURES  Procedures  ____________________________________________   INITIAL IMPRESSION / ASSESSMENT AND PLAN / ED COURSE  Pertinent labs & imaging results that were available during my care of the patient were reviewed by me and considered in my medical decision making (see chart for details).  Patient presented to the emergency department today because of concerns for headache. Patient did feel better after IV Compazine and magnesium. Patient felt comfortable to go home and manage the pain at home. Will give patient urology  follow-up. ____________________________________________   FINAL CLINICAL IMPRESSION(S) / ED DIAGNOSES  Final diagnoses:  Headache, unspecified headache type     Note: This dictation was prepared with Dragon dictation. Any transcriptional errors that result from this process are unintentional    Nance Pear, MD 05/10/16 2334

## 2016-05-10 NOTE — ED Triage Notes (Signed)
Pt arrived to ED by EMS after pulling off side of road with dizziness, N/V, blurred vision and headache. Pts BP 200/110 upon EMS arrival. EMS reports pt was recently seen in ED and treated for headaches. Pt A&O x4 and ambulatory upon arrival to ED.

## 2016-05-10 NOTE — Discharge Instructions (Signed)
Please seek medical attention for any high fevers, chest pain, shortness of breath, change in behavior, persistent vomiting, bloody stool or any other new or concerning symptoms.  

## 2016-05-11 ENCOUNTER — Encounter: Payer: Self-pay | Admitting: Family Medicine

## 2016-05-11 ENCOUNTER — Other Ambulatory Visit: Payer: Self-pay | Admitting: Family Medicine

## 2016-05-11 ENCOUNTER — Ambulatory Visit (INDEPENDENT_AMBULATORY_CARE_PROVIDER_SITE_OTHER): Payer: Self-pay | Admitting: Family Medicine

## 2016-05-11 ENCOUNTER — Telehealth: Payer: Self-pay

## 2016-05-11 VITALS — BP 100/60 | HR 61 | Temp 98.6°F | Wt 169.4 lb

## 2016-05-11 DIAGNOSIS — I10 Essential (primary) hypertension: Secondary | ICD-10-CM

## 2016-05-11 DIAGNOSIS — H60391 Other infective otitis externa, right ear: Secondary | ICD-10-CM

## 2016-05-11 DIAGNOSIS — H9201 Otalgia, right ear: Secondary | ICD-10-CM

## 2016-05-11 DIAGNOSIS — H6691 Otitis media, unspecified, right ear: Secondary | ICD-10-CM

## 2016-05-11 DIAGNOSIS — R51 Headache: Secondary | ICD-10-CM

## 2016-05-11 DIAGNOSIS — R112 Nausea with vomiting, unspecified: Secondary | ICD-10-CM

## 2016-05-11 DIAGNOSIS — R519 Headache, unspecified: Secondary | ICD-10-CM

## 2016-05-11 MED ORDER — ONDANSETRON 8 MG PO TBDP: 8.0000 mg | ORAL_TABLET | Freq: Three times a day (TID) | ORAL | 0 refills | Status: DC | PRN

## 2016-05-11 MED ORDER — CIPROFLOXACIN-DEXAMETHASONE 0.3-0.1 % OT SUSP: 4.0000 [drp] | Freq: Two times a day (BID) | OTIC | 0 refills | Status: DC

## 2016-05-11 MED ORDER — AMOXICILLIN 875 MG PO TABS: 875.0000 mg | ORAL_TABLET | Freq: Two times a day (BID) | ORAL | 0 refills | Status: DC

## 2016-05-11 MED ORDER — METOPROLOL TARTRATE 50 MG PO TABS
50.0000 mg | ORAL_TABLET | Freq: Two times a day (BID) | ORAL | 11 refills | Status: DC
Start: 2016-05-11 — End: 2017-06-08

## 2016-05-11 NOTE — Progress Notes (Signed)
Pre visit review using our clinic review tool, if applicable. No additional management support is needed unless otherwise documented below in the visit note. 

## 2016-05-11 NOTE — Telephone Encounter (Signed)
PLEASE NOTE: All timestamps contained within this report are represented as [place] Standard Time. CONFIDENTIALTY NOTICE: This fax transmission is intended only for the addressee. It contains information that is legally privileged, confidential or otherwise protected from use or disclosure. If you are not the intended recipient, you are strictly prohibited from reviewing, disclosing, copying using or disseminating any of this information or taking any action in reliance on or regarding this information. If you have received this fax in error, please notify us immediately by telephone so that we can arrange for its return to [place]. Phone: [phone], Toll-Free: [phone], Fax: [phone] Page: 1 of 1 Call Id: [id number] [place] Patient Name: Sharon [name] Gender: Female DOB: October 30, 1955 Age: 42 Y 55 M 17 D Return Phone Number: [phone] (Primary) Address: City/State/Zip: [place] Client [place] - Client Client Site [place] Physician Ria Bush - MD Contact Type Call Who Is Calling Patient / Member / Family / Caregiver Call Type Triage / Clinical Relationship To Patient Self Return Phone Number [phone] (Primary) Chief Complaint Headache Reason for Call Symptomatic / Request for [place] states she was exposed to mold in the ceilings of her job and she has a bad headache. Translation No Nurse Assessment Guidelines Guideline Title Affirmed Question Affirmed Notes Nurse Date/Time (Eastern Time) Disp. Time Eilene Ghazi Time) Disposition Final User 05/10/2016 7:39:48 PM Attempt made - message left Toribio Harbour, RN, Joelene Millin 05/10/2016 7:52:23 PM Attempt made - message left Toribio Harbour, RN, Joelene Millin 05/10/2016 8:09:47 PM FINAL ATTEMPT MADE - message left Yes Toribio Harbour, RN, Joelene Millin

## 2016-05-11 NOTE — Telephone Encounter (Signed)
Noted. Would offer fu in office for headaches - 2 ER visits this week for migraines.

## 2016-05-11 NOTE — Progress Notes (Signed)
Subjective:    Patient ID: Sharon [name], female    DOB: 07-31-1956, 60 y.o.   MRN: [id number]  HPI This is a 60 yo female who presents today for follow up of headaches. Was seen in ER 05/07/16 and 05/10/16. Had negative CT scan 05/07/16. Yesterday was given IV compazine and IV magnesium with improvement of symptoms. Currently taking reglan and fioricet with some relief. No headache currently. Headaches are aching, throbbing pain in top of head and into temples. +light and sound sensitivity, no aura, + nausea/vomiting.   Has pressure around eyes and right ear hurts and throbs. Sound muffled. Reports history of psoriasis in ear canals. Feels like she has an infection in her right ear and thinks antibiotics would help clear her ear/headache/sinus.   Blood pressure was high at ER, checks at home and run 140s/70. Not sure if elevated BP related to headaches. Takes metoprolol 50 mg bid, interested in increasing dose.   Past Medical History:  Diagnosis Date  . Anxiety and depression   . Arthritis   . B12 deficiency    pernicious anemia - monthly b12 shots  . Cervical cancer ([place]) 12/2008   s/p hysterectomy  . Crohn's disease ([place]) 2000   h/o stenotic crohn's, active in TI s/p resection 2000, PPD neg ([facility] Dr. [name]); has been on cimzia, remicade, 6MP, entocort, now stable on Entyvio and 6MP ([facility] [place])  . Genital warts   . GERD (gastroesophageal reflux disease)    severe, daily sxs if off PPI  . Hepatic steatosis 04/2011   diffuse on CT, 11m gallbladder polyp  . History of anemia    attributed to crohn's  . History of chicken pox   . History of syphilis 1980s  . HTN (hypertension)   . Migraines    and frequent other headaches (sinus or stress)  . Scalp psoriasis   . Sensorineural hearing loss of both ears 12/2012   high freq  . Takotsubo syndrome 07/2012   cath 08/01/12, normal coronaries, LVEF 65%  . TIA (transient ischemic attack) 05/2010   [facility] vs complex  migraine (w/u negative - carotids, echo, TC doppler, and MRI normal)  . Tobacco abuse    Past Surgical History:  Procedure Laterality Date  . abd [place] 04/2011   diffuse hepatic steatosis, 565mpolyp in gallbaldder fundus, no stones, mod abd ath  . APPENDECTOMY  1998  . CARDIAC CATHETERIZATION  07/2012   normal LV fxn, widely patent coronaries  . CERVICAL BIOPSY  W/ LOOP ELECTRODE EXCISION  10/2008  . COLON RESECTION  10/2014   removal of scar tissue colon from [facility] . COLONOSCOPY  10/2008   ileo-colonic anastomosis, ielocolonic crohn's  . COLONOSCOPY  05/2011   ileo-colonic anastomosis normal, normal mucosa  . COLONOSCOPY  10/15/2012   focal inflammation at anastomosis, no active crohn's, planning MR enterograph (EOletta Lamas . COLONOSCOPY  09/2014   [facility]  . ESOPHAGOGASTRODUODENOSCOPY  05/2011   nl esophagus, gastritis, nl duodenum  . hospitalization  05/2010   TIA vs complex migraine - w/u negative - head CT, MRI/MRA, carotid dopplers, echo.  treated with ASA and tramadol  . LEFT HEART CATHETERIZATION WITH CORONARY ANGIOGRAM N/A 08/01/2012   Procedure: LEFT HEART CATHETERIZATION WITH CORONARY ANGIOGRAM;  Surgeon: MiSherren MochaMD;  [facility];  Service: Cardiovascular;  Laterality: N/A;  . RIGHT COLECTOMY  2000   crohn's disease, ileocecal resection  . SHOULDER ARTHROSCOPY W/  ROTATOR CUFF REPAIR Right 05/31/2012   [facility] Tamera Punt)  . US ECHOCARDIOGRAPHY  05/2010   nl LV fxn ,EF 60%  . VAGINAL HYSTERECTOMY  12/2008   LAVH/BSO   Family History  Problem Relation Age of Onset  . Crohn's disease Daughter     crohn's, ulcerative colitis  . Ulcerative colitis Daughter   . Stroke Mother   . Hypertension Mother   . Hyperlipidemia Mother   . Hypertension Father   . Crohn's disease Father   . Crohn's disease Sister   . Cancer Sister     ovarian  . Crohn's disease Paternal Aunt   . Crohn's disease Paternal Uncle   . Diabetes Neg Hx     Social History  Substance Use Topics  . Smoking status: Current Every Day Smoker    Packs/day: 0.33    Years: 10.00    Types: Cigarettes  . Smokeless tobacco: Never Used  . Alcohol use No     Comment: remote EtOh use      Review of Systems Per HPI    Objective:   Physical Exam  Constitutional: She is oriented to person, place, and time. She appears well-developed and well-nourished. No distress.  HENT:  Head: Normocephalic and atraumatic.  Right Ear: There is swelling (canal red and swollen) and tenderness. No drainage.  Left Ear: Tympanic membrane and external ear normal.  Nose: Right sinus exhibits maxillary sinus tenderness and frontal sinus tenderness. Left sinus exhibits no maxillary sinus tenderness and no frontal sinus tenderness.  Mouth/Throat: Uvula is midline, oropharynx is clear and moist and mucous membranes are normal.  Canal with flaky skin.    Eyes: Conjunctivae and EOM are normal. Pupils are equal, round, and reactive to light. Right eye exhibits no discharge. Left eye exhibits no discharge.  Neck: Normal range of motion. Neck supple.  Cardiovascular: Normal rate, regular rhythm and normal heart sounds.   Pulmonary/Chest: Effort normal and breath sounds normal.  Neurological: She is alert and oriented to person, place, and time.  Skin: Skin is warm and dry. She is not diaphoretic.  Psychiatric: She has a normal mood and affect. Her behavior is normal. Judgment and thought content normal.  Vitals reviewed.     BP 100/60   Pulse 61   Temp 98.6 F (37 C)   Wt 169 lb 6.4 oz (76.8 kg)   SpO2 97%   BMI 32.01 kg/m  Wt Readings from Last 3 Encounters:  05/11/16 169 lb 6.4 oz (76.8 kg)  05/10/16 165 lb (74.8 kg)  05/07/16 167 lb (75.8 kg)   BP Readings from Last 3 Encounters:  05/11/16 100/60  05/10/16 135/71  05/07/16 124/76        Assessment & Plan:  1. Chronic nonintractable headache, unspecified headache type - Ambulatory referral to  Neurology  2. Otalgia of right ear - OTC analgesics prn  3. Otitis, externa, infective, right - will treat with combination drop because of flaking skin and swelling - ciprofloxacin-dexamethasone (CIPRODEX) otic suspension; Place 4 drops into the right ear 2 (two) times daily. For 7-10 day  Dispense: 7.5 mL; Refill: 0  4. Acute right otitis media, recurrence not specified, unspecified otitis media type - unable to visualize TM, but very tender to exam, more so than expected with just OE. - amoxicillin (AMOXIL) 875 MG tablet; Take 1 tablet (875 mg total) by mouth 2 (two) times daily.  Dispense: 20 tablet; Refill: 0  5. Non-intractable vomiting with nausea, unspecified vomiting type - ondansetron (  ZOFRAN-ODT) 8 MG disintegrating tablet; Take 1 tablet (8 mg total) by mouth every 8 (eight) hours as needed for nausea.  Dispense: 30 tablet; Refill: 0  6. Essential hypertension - reviewed most recent blood pressures and discussed likelihood of increased BP in ED due to pain. HR and BP low normal today, do not think it is advisable to increase metoprolol at this time, will maintain on current dose.  - metoprolol (LOPRESSOR) 50 MG tablet; Take 1 tablet (50 mg total) by mouth 2 (two) times daily.  Dispense: 60 tablet; Refill: [place], FNP-BC  [facility]  05/12/2016 10:30 AM

## 2016-05-11 NOTE — Telephone Encounter (Signed)
Per chart review tab pt was seen [facility] ED on 05/10/16.

## 2016-05-12 NOTE — Telephone Encounter (Signed)
Seen yesterday by DG.

## 2016-05-31 ENCOUNTER — Other Ambulatory Visit: Payer: Self-pay | Admitting: Family Medicine

## 2016-05-31 NOTE — Telephone Encounter (Signed)
Rx called in as directed.   

## 2016-05-31 NOTE — Telephone Encounter (Signed)
plz phone in. 

## 2016-06-22 ENCOUNTER — Other Ambulatory Visit: Payer: Self-pay | Admitting: Family Medicine

## 2016-06-22 ENCOUNTER — Other Ambulatory Visit: Payer: Self-pay

## 2016-06-22 NOTE — Telephone Encounter (Signed)
Pt left v/m requesting cb about amitriptyline; pt will leave for 10 days to [place] on 06/24/16 and pt request cb on 06/23/16 with refill info.

## 2016-06-22 NOTE — Telephone Encounter (Signed)
Pt left v/m requesting refill amitriptyline to [facility] [place]. Last refilled # 45 x 6 on 11/09/15. Last seen by Dr Darnell Level on 01/01/16. Pt has med to last until next week but pt is going to [facility] and that is why she is requesting refill early. Pt request cb.

## 2016-06-23 MED ORDER — AMITRIPTYLINE HCL 50 MG PO TABS: 75.0000 mg | ORAL_TABLET | Freq: Every day | ORAL | 6 refills | Status: DC

## 2016-06-23 NOTE — Telephone Encounter (Signed)
Patient notified

## 2016-06-23 NOTE — Telephone Encounter (Signed)
Ok to refill? Last filled 11/09/15 #0 6 RF

## 2016-06-23 NOTE — Telephone Encounter (Signed)
refilled 

## 2016-10-03 ENCOUNTER — Other Ambulatory Visit: Payer: Self-pay | Admitting: Family Medicine

## 2016-10-03 NOTE — Telephone Encounter (Signed)
Last filled 10-29-15 #60 Last OV Acute 04-1316 No Future OV

## 2016-10-04 NOTE — Telephone Encounter (Signed)
Rx called in as directed.   

## 2016-10-04 NOTE — Telephone Encounter (Signed)
plz phone in. 

## 2016-11-10 ENCOUNTER — Encounter: Payer: Self-pay | Admitting: *Deleted

## 2016-11-10 ENCOUNTER — Other Ambulatory Visit: Payer: Self-pay | Admitting: Family Medicine

## 2016-12-15 ENCOUNTER — Other Ambulatory Visit: Payer: Self-pay | Admitting: Family Medicine

## 2016-12-23 ENCOUNTER — Telehealth: Payer: Self-pay | Admitting: Family Medicine

## 2016-12-23 NOTE — Telephone Encounter (Signed)
Opened in error

## 2016-12-27 ENCOUNTER — Encounter: Payer: Self-pay | Admitting: Family Medicine

## 2016-12-27 ENCOUNTER — Ambulatory Visit (INDEPENDENT_AMBULATORY_CARE_PROVIDER_SITE_OTHER): Payer: Self-pay | Admitting: Family Medicine

## 2016-12-27 VITALS — BP 104/68 | HR 64 | Temp 98.1°F | Wt 170.8 lb

## 2016-12-27 DIAGNOSIS — Z87891 Personal history of nicotine dependence: Secondary | ICD-10-CM

## 2016-12-27 DIAGNOSIS — I10 Essential (primary) hypertension: Secondary | ICD-10-CM

## 2016-12-27 DIAGNOSIS — K50919 Crohn's disease, unspecified, with unspecified complications: Secondary | ICD-10-CM

## 2016-12-27 DIAGNOSIS — K2100 Gastro-esophageal reflux disease with esophagitis, without bleeding: Secondary | ICD-10-CM

## 2016-12-27 DIAGNOSIS — R519 Headache, unspecified: Secondary | ICD-10-CM

## 2016-12-27 DIAGNOSIS — F329 Major depressive disorder, single episode, unspecified: Secondary | ICD-10-CM

## 2016-12-27 DIAGNOSIS — R51 Headache: Secondary | ICD-10-CM

## 2016-12-27 DIAGNOSIS — G43909 Migraine, unspecified, not intractable, without status migrainosus: Secondary | ICD-10-CM

## 2016-12-27 DIAGNOSIS — F32A Depression, unspecified: Secondary | ICD-10-CM

## 2016-12-27 DIAGNOSIS — F419 Anxiety disorder, unspecified: Secondary | ICD-10-CM

## 2016-12-27 MED ORDER — AMITRIPTYLINE HCL 50 MG PO TABS: 75.0000 mg | ORAL_TABLET | Freq: Every day | ORAL | 3 refills | Status: DC

## 2016-12-27 MED ORDER — ONDANSETRON 8 MG PO TBDP: 8.0000 mg | ORAL_TABLET | Freq: Three times a day (TID) | ORAL | 0 refills | Status: DC | PRN

## 2016-12-27 MED ORDER — CITALOPRAM HYDROBROMIDE 40 MG PO TABS: 40.0000 mg | ORAL_TABLET | Freq: Every day | ORAL | 6 refills | Status: DC

## 2016-12-27 MED ORDER — PANTOPRAZOLE SODIUM 20 MG PO TBEC: 20.0000 mg | DELAYED_RELEASE_TABLET | Freq: Every day | ORAL | 1 refills | Status: DC

## 2016-12-27 NOTE — Patient Instructions (Addendum)
I'm glad you quit smoking! Keep it up! Change nexium to protonix 68m daily Continue celexa but try 1/2 tablet daily, if doing well on this let me know and we will send in lower dose.  Try 1/2 tablet of metoprolol twice daily and see if blood pressure remains well controled with this.  Continue other medicines as on medicine list.

## 2016-12-27 NOTE — Assessment & Plan Note (Signed)
See above. s/p neurology evaluation and headaches are largely improved.

## 2016-12-27 NOTE — Assessment & Plan Note (Signed)
Able to taper off klonopin per neurology recommendations.  Continues celexa and amitriptyline daily. Suggested trial lower celexa dose - she will cut celexa to 80m (1/2 tab) daily and update me with effect in 1-2 wks, if tolerated well, we can send in lower dose. Pt agrees with plan.

## 2016-12-27 NOTE — Assessment & Plan Note (Signed)
Chronic, stable period on entyvio Q8 wks. Continue.

## 2016-12-27 NOTE — Assessment & Plan Note (Signed)
Change nexium to protonix given celexa interaction.

## 2016-12-27 NOTE — Progress Notes (Signed)
BP 104/68   Pulse 64   Temp 98.1 F (36.7 C) (Oral)   Wt 170 lb 12 oz (77.5 kg)   BMI 32.26 kg/m    CC: med refill visit Subjective:    Patient ID: Sharon [name], female    DOB: August 24, 1956, 61 y.o.   MRN: [id number]  HPI: Sharon [name] is a 61 y.o. female presenting on 12/27/2016 for Follow-up (on medication)   Anxiety/depression - tapered off klonopin - see below. She has continued celexa 73m daily and elavil 775mnightly.   HA - [facility]. She was tapered off klonopin. Discussed possibly coming off celexa due to dementia concerns. Started on gabapentin [id number] and PRN headaches.   Crohn's disease - stable on entyvio, followed by [facility].   Ex smoker - quit smoking 10/2016!!  Relevant past medical, surgical, family and social history reviewed and updated as indicated. Interim medical history since our last visit reviewed. Allergies and medications reviewed and updated. Outpatient Medications Prior to Visit  Medication Sig Dispense Refill  . butalbital-acetaminophen-caffeine (FIORICET) 50-325-40 MG tablet Take 1-2 tablets by mouth every 6 (six) hours as needed for headache. 20 tablet 0  . cyanocobalamin (,VITAMIN B-12,) 1000 MCG/ML injection Inject 1,000 mcg into the muscle every 30 (thirty) days.    . diphenhydrAMINE (BENADRYL) 25 mg capsule Take 12.5 mg by mouth at bedtime as needed for sleep.    . metoprolol (LOPRESSOR) 50 MG tablet Take 1 tablet (50 mg total) by mouth 2 (two) times daily. 60 tablet 11  . vedolizumab (ENTYVIO) 300 MG injection Inject into the vein every 8 (eight) weeks.     . aMarland Kitchenitriptyline (ELAVIL) 50 MG tablet TAKE ONE & ONE-HALF TABLETS BY MOUTH AT BEDTIME 45 tablet 6  . citalopram (CELEXA) 40 MG tablet TAKE 1 TABLET BY MOUTH ONCE DAILY *CALL  AND  SCHEDULE  FOLLOW  UP  FOR  ADDITIONAL  REFILLS* 15 tablet 0  . aspirin EC 81 MG tablet Take 1 tablet (81 mg total) by mouth  daily. Over the counter (Patient not taking: Reported on 12/27/2016) 30 tablet 3  . nitroGLYCERIN (NITROSTAT) 0.4 MG SL tablet Place 1 tablet (0.4 mg total) under the tongue every 5 (five) minutes as needed for chest pain. (Patient not taking: Reported on 12/27/2016) 30 tablet 12  . acetaminophen (TYLENOL) 500 MG tablet Take 1,000 mg by mouth every 6 (six) hours as needed for mild pain or headache.     . aMarland Kitchenidophilus (RISAQUAD) CAPS capsule Take 1 capsule by mouth every evening.    . aMarland Kitchenoxicillin (AMOXIL) 875 MG tablet Take 1 tablet (875 mg total) by mouth 2 (two) times daily. (Patient not taking: Reported on 12/27/2016) 20 tablet 0  . ciprofloxacin-dexamethasone (CIPRODEX) otic suspension Place 4 drops into the right ear 2 (two) times daily. For 7-10 day (Patient not taking: Reported on 12/27/2016) 7.5 mL 0  . clonazePAM (KLONOPIN) 0.5 MG tablet TAKE ONE TABLET BY MOUTH TWICE DAILY AS NEEDED (Patient not taking: Reported on 12/27/2016) 60 tablet 0  . metoCLOPramide (REGLAN) 10 MG tablet Take 1 tablet (10 mg total) by mouth 3 (three) times daily with meals. (Patient not taking: Reported on 12/27/2016) 14 tablet 1  . ondansetron (ZOFRAN-ODT) 8 MG disintegrating tablet Take 1 tablet (8 mg total) by mouth every 8 (eight) hours as needed for nausea. (Patient not taking: Reported on 12/27/2016) 30 tablet 0  . pantoprazole (PROTONIX) 40  MG tablet Take 1 tablet (40 mg total) by mouth daily. (Patient not taking: Reported on 12/27/2016) 30 tablet 3   No facility-administered medications prior to visit.      Per HPI unless specifically indicated in ROS section below Review of Systems     Objective:    BP 104/68   Pulse 64   Temp 98.1 F (36.7 C) (Oral)   Wt 170 lb 12 oz (77.5 kg)   BMI 32.26 kg/m   Wt Readings from Last 3 Encounters:  12/27/16 170 lb 12 oz (77.5 kg)  05/11/16 169 lb 6.4 oz (76.8 kg)  05/10/16 165 lb (74.8 kg)    BP Readings from Last 3 Encounters:  12/27/16 104/68  05/11/16 100/60  05/10/16  135/71    Physical Exam  Constitutional: She appears well-developed and well-nourished. No distress.  HENT:  Mouth/Throat: Oropharynx is clear and moist. No oropharyngeal exudate.  Cardiovascular: Normal rate, regular rhythm, normal heart sounds and intact distal pulses.   No murmur heard. Pulmonary/Chest: Effort normal and breath sounds normal. No respiratory distress. She has no wheezes. She has no rales.  Musculoskeletal: She exhibits no edema.  Skin: Skin is warm and dry. No rash noted.  Psychiatric: She has a normal mood and affect.  Nursing note and vitals reviewed.  Results for orders placed or performed during the hospital encounter of 02/21/93  Basic metabolic panel  Result Value Ref Range   Sodium 136 135 - 145 mmol/L   Potassium 3.1 (L) 3.5 - 5.1 mmol/L   Chloride 105 101 - 111 mmol/L   CO2 24 22 - 32 mmol/L   Glucose, Bld 92 65 - 99 mg/dL   BUN <5 (L) 6 - 20 mg/dL   Creatinine, Ser 0.78 0.44 - 1.00 mg/dL   Calcium 8.9 8.9 - 10.3 mg/dL   GFR calc non Af Amer >60 >60 mL/min   GFR calc Af Amer >60 >60 mL/min   Anion gap 7 5 - 15  CBC  Result Value Ref Range   WBC 6.6 3.6 - 11.0 K/uL   RBC 4.52 3.80 - 5.20 MIL/uL   Hemoglobin 14.1 12.0 - 16.0 g/dL   HCT 40.6 35.0 - 47.0 %   MCV 89.8 80.0 - 100.0 fL   MCH 31.1 26.0 - 34.0 pg   MCHC 34.7 32.0 - 36.0 g/dL   RDW 14.1 11.5 - 14.5 %   Platelets 236 150 - 440 K/uL      Assessment & Plan:   Problem List Items Addressed This Visit    Anxiety and depression - Primary    Able to taper off klonopin per neurology recommendations.  Continues celexa and amitriptyline daily. Suggested trial lower celexa dose - she will cut celexa to 28m (1/2 tab) daily and update me with effect in 1-2 wks, if tolerated well, we can send in lower dose. Pt agrees with plan.       [place]    Chronic, stable period on entyvio Q8 wks. Continue.      Ex-smoker    Quit 10/2016 - congratulated on smoking cessation. Pt motivated to  stay abstinent.       GERD (gastroesophageal reflux disease)    Change nexium to protonix given celexa interaction.       Relevant Medications   ondansetron (ZOFRAN-ODT) 8 MG disintegrating tablet   pantoprazole (PROTONIX) 20 MG tablet   Headache    See above. s/p neurology evaluation and headaches are largely improved.  Relevant Medications   amitriptyline (ELAVIL) 50 MG tablet   citalopram (CELEXA) 40 MG tablet   HTN (hypertension)    Chronic, stable. bp low today. Discussed trial 1/2 metoprolol tablet BID. Pt will update me with effect.       Migraines    Improved with less regular analgesic use, now on gabapentin per neurology recommendations and doing well.       Relevant Medications   ondansetron (ZOFRAN-ODT) 8 MG disintegrating tablet   amitriptyline (ELAVIL) 50 MG tablet   citalopram (CELEXA) 40 MG tablet       Follow up plan: Return in about 6 months (around 06/29/2017) for annual exam, prior fasting for blood work.  Ria Bush, MD

## 2016-12-27 NOTE — Assessment & Plan Note (Signed)
Chronic, stable. bp low today. Discussed trial 1/2 metoprolol tablet BID. Pt will update me with effect.

## 2016-12-27 NOTE — Assessment & Plan Note (Signed)
Improved with less regular analgesic use, now on gabapentin per neurology recommendations and doing well.

## 2016-12-27 NOTE — Assessment & Plan Note (Signed)
Quit 10/2016 - congratulated on smoking cessation. Pt motivated to stay abstinent.

## 2016-12-27 NOTE — Progress Notes (Signed)
Pre visit review using our clinic review tool, if applicable. No additional management support is needed unless otherwise documented below in the visit note. 

## 2017-01-04 ENCOUNTER — Encounter: Payer: Self-pay | Admitting: Family Medicine

## 2017-01-04 MED ORDER — CITALOPRAM HYDROBROMIDE 40 MG PO TABS: 40.0000 mg | ORAL_TABLET | Freq: Every day | ORAL | 6 refills | Status: DC

## 2017-01-14 ENCOUNTER — Encounter: Payer: Self-pay | Admitting: Emergency Medicine

## 2017-01-14 ENCOUNTER — Emergency Department
Admission: EM | Admit: 2017-01-14 | Discharge: 2017-01-14 | Disposition: A | Discharge status: Home / Self Care | Payer: Self-pay | Attending: Student in an Organized Health Care Education/Training Program | Admitting: Student in an Organized Health Care Education/Training Program

## 2017-01-14 ENCOUNTER — Emergency Department: Payer: Self-pay

## 2017-01-14 DIAGNOSIS — I10 Essential (primary) hypertension: Secondary | ICD-10-CM | POA: Insufficient documentation

## 2017-01-14 DIAGNOSIS — N3001 Acute cystitis with hematuria: Secondary | ICD-10-CM | POA: Insufficient documentation

## 2017-01-14 DIAGNOSIS — Z79899 Other long term (current) drug therapy: Secondary | ICD-10-CM | POA: Insufficient documentation

## 2017-01-14 DIAGNOSIS — Z87891 Personal history of nicotine dependence: Secondary | ICD-10-CM | POA: Insufficient documentation

## 2017-01-14 DIAGNOSIS — R030 Elevated blood-pressure reading, without diagnosis of hypertension: Secondary | ICD-10-CM

## 2017-01-14 DIAGNOSIS — R109 Unspecified abdominal pain: Secondary | ICD-10-CM

## 2017-01-14 LAB — URINALYSIS, COMPLETE (UACMP) WITH MICROSCOPIC
BILIRUBIN URINE: NEGATIVE
GLUCOSE, UA: NEGATIVE mg/dL
Ketones, ur: NEGATIVE mg/dL
NITRITE: NEGATIVE
PROTEIN: NEGATIVE mg/dL
Specific Gravity, Urine: 1.012 (ref 1.005–1.030)
pH: 5 (ref 5.0–8.0)

## 2017-01-14 LAB — COMPREHENSIVE METABOLIC PANEL
ALT: 40 U/L (ref 14–54)
ANION GAP: 10 (ref 5–15)
AST: 50 U/L — AB (ref 15–41)
Albumin: 3.8 g/dL (ref 3.5–5.0)
Alkaline Phosphatase: 123 U/L (ref 38–126)
BILIRUBIN TOTAL: 0.6 mg/dL (ref 0.3–1.2)
BUN: 6 mg/dL (ref 6–20)
CHLORIDE: 105 mmol/L (ref 101–111)
CO2: 22 mmol/L (ref 22–32)
Calcium: 8.9 mg/dL (ref 8.9–10.3)
Creatinine, Ser: 0.87 mg/dL (ref 0.44–1.00)
Glucose, Bld: 188 mg/dL — ABNORMAL HIGH (ref 65–99)
POTASSIUM: 3.3 mmol/L — AB (ref 3.5–5.1)
Sodium: 137 mmol/L (ref 135–145)
TOTAL PROTEIN: 7.6 g/dL (ref 6.5–8.1)

## 2017-01-14 LAB — CBC
HEMATOCRIT: 39.4 % (ref 35.0–47.0)
HEMOGLOBIN: 13.4 g/dL (ref 12.0–16.0)
MCH: 29.9 pg (ref 26.0–34.0)
MCHC: 33.9 g/dL (ref 32.0–36.0)
MCV: 88.1 fL (ref 80.0–100.0)
Platelets: 273 10*3/uL (ref 150–440)
RBC: 4.47 MIL/uL (ref 3.80–5.20)
RDW: 13.9 % (ref 11.5–14.5)
WBC: 10.4 10*3/uL (ref 3.6–11.0)

## 2017-01-14 LAB — TROPONIN I

## 2017-01-14 LAB — LIPASE, BLOOD: LIPASE: 18 U/L (ref 11–51)

## 2017-01-14 MED ORDER — CEPHALEXIN 500 MG PO CAPS: 500.0000 mg | ORAL_CAPSULE | Freq: Three times a day (TID) | ORAL | 0 refills | Status: AC

## 2017-01-14 MED ORDER — OXYCODONE-ACETAMINOPHEN 5-325 MG PO TABS
1.0000 | ORAL_TABLET | Freq: Once | ORAL | Status: AC
  Administered 2017-01-14: 1 via ORAL
  Filled 2017-01-14: qty 1

## 2017-01-14 MED ORDER — SODIUM CHLORIDE 0.9 % IV BOLUS (SEPSIS)
1000.0000 mL | Freq: Once | INTRAVENOUS | Status: AC
  Administered 2017-01-14: 1000 mL via INTRAVENOUS

## 2017-01-14 NOTE — ED Notes (Signed)
Pt being transported to [place] by Wendelyn Breslow, [employer]

## 2017-01-14 NOTE — ED Provider Notes (Signed)
[facility] Emergency Department Provider Note    First MD Initiated Contact with Patient 01/14/17 1550     (approximate)  I have reviewed the triage vital signs and the nursing notes.   HISTORY  Chief Complaint Hypertension and Flank Pain    HPI Sharon [name] is a 61 y.o. female history of Crohn's disease presents with right flank pain that is been off and on for the past 5 days associated with some nausea, increased urinary frequency. Patient checked her blood pressure this morning when she felt that she was having worsening pain and she was concerned that her blood pressure was elevated as documented in nursing notes from today's visit. Patient was recently decreased on her metoprolol by her PCP as her blood pressure had been too low. She is currently taking metoprolol twice a day. She denies any chest pain. No shortness of breath. Denies any pain at this moment but states that the pain will come the last several minutes and is gnawing in nature. Denies any history of kidney stones. States this does not feel like her Crohn's pain   Past Medical History:  Diagnosis Date  . Anxiety and depression   . Arthritis   . B12 deficiency    pernicious anemia - monthly b12 shots  . Cervical cancer ([place]) 12/2008   s/p hysterectomy  . Crohn's disease ([place]) 2000   h/o stenotic crohn's, active in TI s/p resection 2000, PPD neg ([facility] Dr. [name]); has been on cimzia, remicade, 6MP, entocort, now stable on Entyvio and 6MP ([facility] [place])  . Genital warts   . GERD (gastroesophageal reflux disease)    severe, daily sxs if off PPI  . Hepatic steatosis 04/2011   diffuse on CT, 42m gallbladder polyp  . History of anemia    attributed to crohn's  . History of chicken pox   . History of syphilis 1980s  . HTN (hypertension)   . Migraines    and frequent other headaches (sinus or stress)  . Scalp psoriasis   . Sensorineural hearing loss of both ears 12/2012   high freq  . Takotsubo syndrome 07/2012   cath 08/01/12, normal coronaries, LVEF 65%  . TIA (transient ischemic attack) [facility]/2010   [facility] (w/u negative - carotids, echo, TC doppler, and MRI normal)  . Tobacco abuse    Family History  Problem Relation Age of Onset  . Crohn's disease Daughter        crohn's, ulcerative colitis  . Ulcerative colitis Daughter   . Stroke Mother   . Hypertension Mother   . Hyperlipidemia Mother   . Hypertension Father   . Crohn's disease Father   . Crohn's disease Sister   . Cancer Sister        ovarian  . Crohn's disease Paternal Aunt   . Crohn's disease Paternal Uncle   . Diabetes Neg Hx    Past Surgical History:  Procedure Laterality Date  . abd UKorea 04/2011   diffuse hepatic steatosis, 556mpolyp in gallbaldder fundus, no stones, mod abd ath  . APPENDECTOMY  1998  . CARDIAC CATHETERIZATION  07/2012   normal LV fxn, widely patent coronaries  . CERVICAL BIOPSY  W/ LOOP ELECTRODE EXCISION  10/2008  . COLON RESECTION  10/2014   removal [facility] scar tissue [facility] COLONOSCOPY  10/2008   ileo-colonic anastomosis, ielocolonic crohn's  . COLONOSCOPY  05/2011  ileo-colonic anastomosis normal, normal mucosa  . COLONOSCOPY  10/15/2012   focal inflammation at anastomosis, no active crohn's, planning MR enterograph [name])  . COLONOSCOPY  09/2014   [facility]  . ESOPHAGOGASTRODUODENOSCOPY  05/2011   nl esophagus, gastritis, nl duodenum  . hospitalization  05/2010   TIA vs complex migraine - w/u negative - head CT, MRI/MRA, carotid dopplers, echo.  treated with ASA and tramadol  . LEFT HEART CATHETERIZATION WITH CORONARY ANGIOGRAM N/A 08/01/2012   Procedure: LEFT HEART CATHETERIZATION WITH CORONARY ANGIOGRAM;  Surgeon: Sherren Mocha, MD;  Location: [facility] CATH LAB;  Service: Cardiovascular;  Laterality: N/A;  . RIGHT COLECTOMY  2000   crohn's disease, ileocecal resection  . SHOULDER ARTHROSCOPY W/  ROTATOR CUFF REPAIR Right 05/31/2012   [facility] Tamera Punt)  . US ECHOCARDIOGRAPHY  05/2010   nl LV fxn ,EF 60%  . VAGINAL HYSTERECTOMY  12/2008   LAVH/BSO   Patient Active Problem List   Diagnosis Date Noted  . Atypical chest pain 04/30/2016  . Hematuria 01/01/2016  . Hyperglycemia 05/09/2013  . HLD (hyperlipidemia) 02/12/2013  . Diarrhea 12/18/2012  . Right knee pain 11/13/2012  . Takotsubo syndrome   . Chest pain 08/02/2012  . Bilateral hearing loss 06/27/2012  . GERD (gastroesophageal reflux disease)   . Skin rash 04/27/2012  . Right shoulder pain 03/13/2012  . History of pneumonia 01/18/2012  . Headache 10/13/2011  . Healthcare maintenance 08/16/2011  . Leukopenia 08/16/2011  . Vitamin B12 deficiency   . HTN (hypertension)   . Migraines   . Crohn's disease ([place])   . Anxiety and depression   . Ex-smoker   . TIA (transient ischemic attack)   . Hepatic steatosis 04/30/2011      Prior to Admission medications   Medication Sig Start Date End Date Taking? Authorizing Provider  amitriptyline (ELAVIL) 50 MG tablet Take 1.5 tablets (75 mg total) by mouth at bedtime. 12/27/16   Ria Bush, MD  aspirin EC 81 MG tablet Take 1 tablet (81 mg total) by mouth daily. Over the counter Patient not taking: Reported on 12/27/2016 05/01/16   Mendel Corning, MD  butalbital-acetaminophen-caffeine (FIORICET) [phone] MG tablet Take 1-2 tablets by mouth every 6 (six) hours as needed for headache. 05/07/16 05/07/17  Triplett, Johnette Abraham B, FNP  citalopram (CELEXA) 40 MG tablet Take 1 tablet (40 mg total) by mouth daily. 01/04/17   Ria Bush, MD  cyanocobalamin (,VITAMIN B-12,) 1000 MCG/ML injection Inject 1,000 mcg into the muscle every 30 (thirty) days.    [provider]  diphenhydrAMINE (BENADRYL) 25 mg capsule Take 12.5 mg by mouth at bedtime as needed for sleep.    [provider]  metoprolol (LOPRESSOR) 50 MG tablet Take 1 tablet (50 mg total) by mouth 2 (two) times  daily. 05/11/16   Elby Beck, FNP  nitroGLYCERIN (NITROSTAT) 0.4 MG SL tablet Place 1 tablet (0.4 mg total) under the tongue every 5 (five) minutes as needed for chest pain. Patient not taking: Reported on 12/27/2016 05/01/16   Rai, Vernelle Emerald, MD  ondansetron (ZOFRAN-ODT) 8 MG disintegrating tablet Take 1 tablet (8 mg total) by mouth every 8 (eight) hours as needed for nausea. 12/27/16   Ria Bush, MD  pantoprazole (PROTONIX) 20 MG tablet Take 1 tablet (20 mg total) by mouth daily. 12/27/16   Ria Bush, MD  vedolizumab (ENTYVIO) 300 MG injection Inject into the vein every 8 (eight) weeks.     [provider]    Allergies Infliximab; Codeine; Flagyl [metronidazole];  Hydrocodone; Ibuprofen; and Remeron [mirtazapine]    Social History Social History  Substance Use Topics  . Smoking status: Former Smoker    Packs/day: 0.33    Years: 10.00    Types: Cigarettes    Quit date: 11/14/2016  . Smokeless tobacco: Never Used  . Alcohol use No     Comment: remote EtOh use    Review of Systems Patient denies headaches, rhinorrhea, blurry vision, numbness, shortness of breath, chest pain, edema, cough, abdominal pain, nausea, vomiting, diarrhea, dysuria, fevers, rashes or hallucinations unless otherwise stated above in HPI. ____________________________________________   PHYSICAL EXAM:  VITAL SIGNS: Vitals:   01/14/17 1428  BP: (!) 148/75  Pulse: (!) 106  Resp: 18  Temp: 98.7 F (37.1 C)    Constitutional: Alert and oriented. Well appearing and in no acute distress. Eyes: Conjunctivae are normal. PERRL. EOMI. Head: Atraumatic. Nose: No congestion/rhinnorhea. Mouth/Throat: Mucous membranes are moist.  Oropharynx non-erythematous. Neck: No stridor. Painless ROM. No cervical spine tenderness to palpation Hematological/Lymphatic/Immunilogical: No cervical lymphadenopathy. Cardiovascular: Normal rate, regular rhythm. Grossly normal heart sounds.  Good peripheral  circulation. Respiratory: Normal respiratory effort.  No retractions. Lungs CTAB. Gastrointestinal: Soft and nontender. No distention. No abdominal bruits. + right CVA tenderness. Musculoskeletal: No lower extremity tenderness nor edema.  No joint effusions. Neurologic:  Normal speech and language. No gross focal neurologic deficits are appreciated. No gait instability. Skin:  Skin is warm, dry and intact. No rash noted. Psychiatric: Mood and affect are normal. Speech and behavior are normal.  ____________________________________________   LABS (all labs ordered are listed, but only abnormal results are displayed)  Results for orders placed or performed during the hospital encounter of 01/14/17 (from the past 24 hour(s))  Lipase, blood     Status: None   Collection Time: 01/14/17  2:28 PM  Result Value Ref Range   Lipase 18 11 - 51 U/L  Comprehensive metabolic panel     Status: Abnormal   Collection Time: 01/14/17  2:28 PM  Result Value Ref Range   Sodium 137 135 - 145 mmol/L   Potassium 3.3 (L) 3.5 - 5.1 mmol/L   Chloride 105 101 - 111 mmol/L   CO2 22 22 - 32 mmol/L   Glucose, Bld 188 (H) 65 - 99 mg/dL   BUN 6 6 - 20 mg/dL   Creatinine, Ser 0.87 0.44 - 1.00 mg/dL   Calcium 8.9 8.9 - 10.3 mg/dL   Total Protein 7.6 6.5 - 8.1 g/dL   Albumin 3.8 3.5 - 5.0 g/dL   AST 50 (H) 15 - 41 U/L   ALT 40 14 - 54 U/L   Alkaline Phosphatase 123 38 - 126 U/L   Total Bilirubin 0.6 0.3 - 1.2 mg/dL   GFR calc non Af Amer >60 >60 mL/min   GFR calc Af Amer >60 >60 mL/min   Anion gap 10 5 - 15  CBC     Status: None   Collection Time: 01/14/17  2:28 PM  Result Value Ref Range   WBC 10.4 3.6 - 11.0 K/uL   RBC 4.47 3.80 - 5.20 MIL/uL   Hemoglobin 13.4 12.0 - 16.0 g/dL   HCT 39.4 35.0 - 47.0 %   MCV 88.1 80.0 - 100.0 fL   MCH 29.9 26.0 - 34.0 pg   MCHC 33.9 32.0 - 36.0 g/dL   RDW 13.9 11.5 - 14.5 %   Platelets 273 150 - 440 K/uL  Urinalysis, Complete w Microscopic     Status: Abnormal  Collection Time: 01/14/17  2:28 PM  Result Value Ref Range   Color, Urine YELLOW (A) YELLOW   APPearance HAZY (A) CLEAR   Specific Gravity, Urine 1.012 1.005 - 1.030   pH 5.0 5.0 - 8.0   Glucose, UA NEGATIVE NEGATIVE mg/dL   Hgb urine dipstick MODERATE (A) NEGATIVE   Bilirubin Urine NEGATIVE NEGATIVE   Ketones, ur NEGATIVE NEGATIVE mg/dL   Protein, ur NEGATIVE NEGATIVE mg/dL   Nitrite NEGATIVE NEGATIVE   Leukocytes, UA LARGE (A) NEGATIVE   RBC / HPF 6-30 0 - 5 RBC/hpf   WBC, UA 6-30 0 - 5 WBC/hpf   Bacteria, UA RARE (A) NONE SEEN   Squamous Epithelial / LPF 6-30 (A) NONE SEEN   Mucous PRESENT    Non Squamous Epithelial 0-5 (A) NONE SEEN  Troponin I     Status: None   Collection Time: 01/14/17  2:28 PM  Result Value Ref Range   Troponin I <0.03 <0.03 ng/mL   ____________________________________________  EKG My review and personal interpretation at Time: 15:36   Indication: flank pain  Rate: 95  Rhythm: sinus Axis: normal Other: normal intervals, no STEMI ____________________________________________  RADIOLOGY  I personally reviewed all radiographic images ordered to evaluate for the above acute complaints and reviewed radiology reports and findings.  These findings were personally discussed with the patient.  Please see medical record for radiology report.  ____________________________________________   PROCEDURES  Procedure(s) performed:  Procedures    Critical Care performed: no ____________________________________________   INITIAL IMPRESSION / ASSESSMENT AND PLAN / ED COURSE  Pertinent labs & imaging results that were available during my care of the patient were reviewed by me and considered in my medical decision making (see chart for details).  DDX: stone, pyelo, msk strain, shingles, cholelithiasis, hepatitis,   Sharon [name] is a 61 y.o. who presents to the ED with left flank pain with Hx of crohns disease. No fevers, no systemic symptoms. + urinary  symptoms. Denies trauma or injury. Afebrile in ED. Exam as above. Flank TTP, otherwise abdominal exam is benign. No peritoneal signs. Possible kidney stone, cystitis, or pyelonephritis.   Checking urine. UA with + hgb, wbc, rare bacteria Clinical picture is not consistent with appendicitis, diverticulitis, pancreatitis, cholecystitis, bowel perforation, aortic dissection, splenic injury or acute abdominal process at this time.     Clinical Course as of Jan 15 1807  Sat Jan 14, 2017  1753 CT imaging with no evidence of stone. He does report sludge in gallbladder but she has no right upper quadrant tenderness on exam. Biliary labs are unremarkable.  [PR]    Clinical Course User Index [PR] Merlyn Lot, MD   ----------------------------------------- 6:10 PM on 01/14/2017 -----------------------------------------   Pain improved, tolerating PO. Repeat ABD exam benign, will plan supportive treatment and early follow up for recheck.  ____________________________________________   FINAL CLINICAL IMPRESSION(S) / ED DIAGNOSES  Final diagnoses:  Acute right flank pain  Elevated blood pressure reading  Acute cystitis with hematuria      NEW MEDICATIONS STARTED DURING THIS VISIT:  New Prescriptions   No medications on file     Note:  This document was prepared using Dragon voice recognition software and may include unintentional dictation errors.    Merlyn Lot, MD 01/14/17 [phone]

## 2017-01-14 NOTE — ED Triage Notes (Addendum)
Pt states she is here today for her BP and for pain in her side. Pt states BP at home was 134/92, Hr 93 Laying down, Sitting 138/91 Hr 95, and Standing 153/97, Hr 109. Pt also c/o not feeling well for the last couple of days. Pt also c/o R flank pain that started approx 5 days ago and has progressed. Pt is alert and oriented, NAD noted at this time. Pt also c/o intermittent chest tightness for the last couple of days.

## 2017-01-14 NOTE — ED Notes (Signed)
Pt now stating that pain is RUQ abdominal pain.

## 2017-02-13 ENCOUNTER — Encounter: Payer: Self-pay | Admitting: Family Medicine

## 2017-03-17 ENCOUNTER — Encounter: Payer: Self-pay | Admitting: Family Medicine

## 2017-06-07 ENCOUNTER — Other Ambulatory Visit: Payer: Self-pay | Admitting: Family Medicine

## 2017-06-07 DIAGNOSIS — I10 Essential (primary) hypertension: Secondary | ICD-10-CM

## 2017-06-08 ENCOUNTER — Other Ambulatory Visit: Payer: Self-pay | Admitting: Family Medicine

## 2017-06-08 DIAGNOSIS — I10 Essential (primary) hypertension: Secondary | ICD-10-CM

## 2017-06-13 ENCOUNTER — Telehealth: Payer: Self-pay

## 2017-06-13 ENCOUNTER — Emergency Department
Admission: EM | Admit: 2017-06-13 | Discharge: 2017-06-13 | Disposition: A | Discharge status: Home / Self Care | Payer: Self-pay | Attending: Emergency Medicine | Admitting: Emergency Medicine

## 2017-06-13 ENCOUNTER — Emergency Department: Payer: Self-pay

## 2017-06-13 DIAGNOSIS — Z87891 Personal history of nicotine dependence: Secondary | ICD-10-CM | POA: Insufficient documentation

## 2017-06-13 DIAGNOSIS — Z79899 Other long term (current) drug therapy: Secondary | ICD-10-CM | POA: Insufficient documentation

## 2017-06-13 DIAGNOSIS — Z8673 Personal history of transient ischemic attack (TIA), and cerebral infarction without residual deficits: Secondary | ICD-10-CM | POA: Insufficient documentation

## 2017-06-13 DIAGNOSIS — E876 Hypokalemia: Secondary | ICD-10-CM | POA: Insufficient documentation

## 2017-06-13 DIAGNOSIS — I10 Essential (primary) hypertension: Secondary | ICD-10-CM | POA: Insufficient documentation

## 2017-06-13 LAB — BASIC METABOLIC PANEL
Anion gap: 11 (ref 5–15)
BUN: 9 mg/dL (ref 6–20)
CHLORIDE: 107 mmol/L (ref 101–111)
CO2: 19 mmol/L — ABNORMAL LOW (ref 22–32)
CREATININE: 0.72 mg/dL (ref 0.44–1.00)
Calcium: 9 mg/dL (ref 8.9–10.3)
Glucose, Bld: 163 mg/dL — ABNORMAL HIGH (ref 65–99)
POTASSIUM: 3 mmol/L — AB (ref 3.5–5.1)
SODIUM: 137 mmol/L (ref 135–145)

## 2017-06-13 LAB — TROPONIN I

## 2017-06-13 LAB — CBC
HEMATOCRIT: 40 % (ref 35.0–47.0)
Hemoglobin: 13.4 g/dL (ref 12.0–16.0)
MCH: 30.2 pg (ref 26.0–34.0)
MCHC: 33.5 g/dL (ref 32.0–36.0)
MCV: 90.4 fL (ref 80.0–100.0)
Platelets: 257 10*3/uL (ref 150–440)
RBC: 4.43 MIL/uL (ref 3.80–5.20)
RDW: 14.6 % — AB (ref 11.5–14.5)
WBC: 11.7 10*3/uL — AB (ref 3.6–11.0)

## 2017-06-13 MED ORDER — POTASSIUM CHLORIDE CRYS ER 20 MEQ PO TBCR
40.0000 meq | EXTENDED_RELEASE_TABLET | Freq: Once | ORAL | Status: AC
  Administered 2017-06-13: 40 meq via ORAL
  Filled 2017-06-13: qty 2

## 2017-06-13 NOTE — ED Provider Notes (Signed)
[facility] Emergency Department Provider Note   ____________________________________________   I have reviewed the triage vital signs and the nursing notes.   HISTORY  Chief Complaint Hypertension   History limited by: Not Limited   HPI Sharon [name] is a 61 y.o. female who presents to the emergency department today because of concerns for hypertension.  DURATION:started today TIMING: lasted a few hours, had improved by the time of my evaluation SEVERITY: systolic in 505L CONTEXT: patient with history of htn. States she missed her blood pressure medication yesterday.  MODIFYING FACTORS: none identified  ASSOCIATED SYMPTOMS: dizziness. Patient denied any chest pain to me (contrary to triage note). No palpitations.  Per medical record review patient has a history of anxiety, htn.  Past Medical History:  Diagnosis Date  . Anxiety and depression   . Arthritis   . B12 deficiency    pernicious anemia - monthly b12 shots  . Cervical cancer ([place]) 12/2008   s/p hysterectomy  . Crohn's disease ([place]) 2000   h/o stenotic crohn's, active in TI s/p resection 2000, PPD neg ([facility] Dr. [name]); has been on cimzia, remicade, 6MP, entocort, now stable on Entyvio and 6MP ([facility] at [place])  . Genital warts   . GERD (gastroesophageal reflux disease)    severe, daily sxs if off PPI  . Hepatic steatosis 04/2011   diffuse on CT, 70m gallbladder polyp  . History of anemia    attributed to crohn's  . History of chicken pox   . History of syphilis 1980s  . HTN (hypertension)   . Migraines    and frequent other headaches (sinus or stress)  . Scalp psoriasis   . Sensorineural hearing loss of both ears 12/2012   high freq  . Takotsubo syndrome 07/2012   cath 08/01/12, normal coronaries, LVEF 65%  . T[facility] (transient ischemic attack) [facility]/2010   [facility] (w/u negative - carotids, echo, TC doppler, and MRI normal)  . Tobacco abuse      Patient Active Problem List   Diagnosis Date Noted  . Atypical chest pain 04/30/2016  . Hematuria 01/01/2016  . Hyperglycemia 05/09/2013  . HLD (hyperlipidemia) 02/12/2013  . Diarrhea 12/18/2012  . Right knee pain 11/13/2012  . Takotsubo syndrome   . Chest pain 08/02/2012  . Bilateral hearing loss 06/27/2012  . GERD (gastroesophageal reflux disease)   . Skin rash 04/27/2012  . Right shoulder pain 03/13/2012  . History of pneumonia 01/18/2012  . Headache 10/13/2011  . Healthcare maintenance 08/16/2011  . Leukopenia 08/16/2011  . Vitamin B12 deficiency   . HTN (hypertension)   . Migraines   . [place]   . Anxiety and depression   . Ex-smoker   . TIA (transient ischemic attack)   . Hepatic steatosis 04/30/2011    Past Surgical History:  Procedure Laterality Date  . abd UKorea 04/2011   diffuse hepatic steatosis, 526mpolyp in gallbaldder fundus, no stones, mod abd ath  . APPENDECTOMY  1998  . CARDIAC CATHETERIZATION  07/2012   normal LV fxn, widely patent coronaries  . CERVICAL BIOPSY  W/ LOOP ELECTRODE EXCISION  10/2008  . COLON RESECTION  10/2014   [facility] . COLONOSCOPY  10/2008   ileo-colonic anastomosis, ielocolonic crohn's  . COLONOSCOPY  05/2011   ileo-colonic anastomosis normal, normal mucosa  . COLONOSCOPY  10/15/2012   focal inflammation at anastomosis, no active crohn's,  planning MR enterograph [name])  . COLONOSCOPY  09/2014   [facility]  . ESOPHAGOGASTRODUODENOSCOPY  05/2011   nl esophagus, gastritis, nl duodenum  . hospitalization  05/2010   TIA vs complex migraine - w/u negative - head CT, MRI/MRA, carotid dopplers, echo.  treated with ASA and tramadol  . LEFT HEART CATHETERIZATION WITH CORONARY ANGIOGRAM N/A 08/01/2012   Procedure: LEFT HEART CATHETERIZATION WITH CORONARY ANGIOGRAM;  Surgeon: Sherren Mocha, MD;  Location: [facility] CATH LAB;  Service: Cardiovascular;  Laterality: N/A;  . RIGHT  COLECTOMY  2000   crohn's disease, ileocecal resection  . SHOULDER ARTHROSCOPY W/ ROTATOR CUFF REPAIR Right 05/31/2012   [facility] Tamera Punt)  . US ECHOCARDIOGRAPHY  05/2010   nl LV fxn ,EF 60%  . VAGINAL HYSTERECTOMY  12/2008   [facility]    Prior to Admission medications   Medication Sig Start Date End Date Taking? Authorizing Provider  amitriptyline (ELAVIL) 50 MG tablet Take 1.5 tablets (75 mg total) by mouth at bedtime. 12/27/16   Ria Bush, MD  aspirin EC 81 MG tablet Take 1 tablet (81 mg total) by mouth daily. Over the counter Patient not taking: Reported on 12/27/2016 05/01/16   Rai, Vernelle Emerald, MD  citalopram (CELEXA) 40 MG tablet Take 1 tablet (40 mg total) by mouth daily. 01/04/17   Ria Bush, MD  cyanocobalamin (,VITAMIN B-12,) 1000 MCG/ML injection Inject 1,000 mcg into the muscle every 30 (thirty) days.    [provider]  diphenhydrAMINE (BENADRYL) 25 mg capsule Take 12.5 mg by mouth at bedtime as needed for sleep.    [provider]  metoprolol (LOPRESSOR) 50 MG tablet Take 1 tablet (50 mg total) by mouth 2 (two) times daily. 05/11/16   Elby Beck, FNP  nitroGLYCERIN (NITROSTAT) 0.4 MG SL tablet Place 1 tablet (0.4 mg total) under the tongue every 5 (five) minutes as needed for chest pain. Patient not taking: Reported on 12/27/2016 05/01/16   Rai, Vernelle Emerald, MD  ondansetron (ZOFRAN-ODT) 8 MG disintegrating tablet Take 1 tablet (8 mg total) by mouth every 8 (eight) hours as needed for nausea. 12/27/16   Ria Bush, MD  pantoprazole (PROTONIX) 20 MG tablet Take 1 tablet (20 mg total) by mouth daily. 12/27/16   Ria Bush, MD  vedolizumab (ENTYVIO) 300 MG injection Inject into the vein every 8 (eight) weeks.     [provider]    Allergies Infliximab; Codeine; Flagyl [metronidazole]; Hydrocodone; Ibuprofen; and Remeron [mirtazapine]  Family History  Problem Relation Age of Onset  . Crohn's disease Daughter        crohn's,  ulcerative colitis  . Ulcerative colitis Daughter   . Stroke Mother   . Hypertension Mother   . Hyperlipidemia Mother   . Hypertension Father   . Crohn's disease Father   . Crohn's disease Sister   . Cancer Sister        ovarian  . Crohn's disease Paternal Aunt   . Crohn's disease Paternal Uncle   . Diabetes Neg Hx     Social History Social History  Substance Use Topics  . Smoking status: Former Smoker    Packs/day: 0.33    Years: 10.00    Types: Cigarettes    Quit date: 11/14/2016  . Smokeless tobacco: Never Used  . Alcohol use No     Comment: remote EtOh use    Review of Systems Constitutional: No fever/chills Eyes: No visual changes. ENT: No sore throat. Cardiovascular: Denies chest pain. Respiratory: Denies shortness of breath. Gastrointestinal:  No abdominal pain.  No nausea, no vomiting.  No diarrhea.   Genitourinary: Negative for dysuria. Musculoskeletal: Negative for back pain. Skin: Negative for rash. Neurological: Positive for dizziness.  ____________________________________________   PHYSICAL EXAM:  VITAL SIGNS: ED Triage Vitals [06/13/17 0036]  Enc Vitals Group     BP 131/66     Pulse Rate (!) 117     Resp 20     Temp 97.7 F (36.5 C)     Temp Source Oral     SpO2 99 %     Weight 168 lb (76.2 kg)     Height 5' 1.5" (1.562 m)     Head Circumference      Peak Flow      Pain Score 3    Constitutional: Alert and oriented. Well appearing and in no distress. Eyes: Conjunctivae are normal.  ENT   Head: Normocephalic and atraumatic.   Nose: No congestion/rhinnorhea.   Mouth/Throat: Mucous membranes are moist.   Neck: No stridor. Hematological/Lymphatic/Immunilogical: No cervical lymphadenopathy. Cardiovascular: Normal rate, regular rhythm.  No murmurs, rubs, or gallops.  Respiratory: Normal respiratory effort without tachypnea nor retractions. Breath sounds are clear and equal bilaterally. No  wheezes/rales/rhonchi. Gastrointestinal: Soft and non tender. No rebound. No guarding.  Genitourinary: Deferred Musculoskeletal: Normal range of motion in all extremities. No lower extremity edema. Neurologic:  Normal speech and language. No gross focal neurologic deficits are appreciated.  Skin:  Skin is warm, dry and intact. No rash noted. Psychiatric: Mood and affect are normal. Speech and behavior are normal. Patient exhibits appropriate insight and judgment.  ____________________________________________    LABS (pertinent positives/negatives)  Trop <0.03 CBC WBC 11.7, hgb 13.4 BMP K 3.0, CO2 19, Glu 163  ____________________________________________   EKG  I, Nance Pear, attending physician, personally viewed and interpreted this EKG  EKG Time: 0035 Rate: 110 Rhythm: sinus tachycardia Axis: left axis deviation Intervals: qtc 489 QRS: narrow, LVH ST changes: no st elevation Impression: abnormal ekg   ____________________________________________    RADIOLOGY  CXR No acute disease.  ____________________________________________   PROCEDURES  Procedures  ____________________________________________   INITIAL IMPRESSION / ASSESSMENT AND PLAN / ED COURSE  Pertinent labs & imaging results that were available during my care of the patient were reviewed by me and considered in my medical decision making (see chart for details).  patient presented to the emergency department today because of concerns for high blood pressure and some dizziness. By the time I examination patient stated she felt improved. She denied any chest pain to me. Blood work did not show any concerning electrolyte abnormalities that might explain dizziness. Also did not show any infectious cause. No signs of any cardiac ischemia. Given the patient felt better do feel she is safe for discharge. Discussed the findings and plan with  patient.  ____________________________________________   FINAL CLINICAL IMPRESSION(S) / ED DIAGNOSES  Final diagnoses:  Hypertension, unspecified type  Hypokalemia     Note: This dictation was prepared with Dragon dictation. Any transcriptional errors that result from this process are unintentional     Nance Pear, MD 06/13/17 [phone]

## 2017-06-13 NOTE — Telephone Encounter (Signed)
Spoke with pt notifying her rx was sent to pharmacy.  Pt confirms she takes 50 mg in  AM then 25 mg in PM.

## 2017-06-13 NOTE — Discharge Instructions (Signed)
Please seek medical attention for any high fevers, chest pain, shortness of breath, change in behavior, persistent vomiting, bloody stool or any other new or concerning symptoms.  

## 2017-06-13 NOTE — Telephone Encounter (Signed)
Pt left v/m wanting to know about refill of metoprolol and if was approved for refill and if not pt request cb with why not. Pt seen [facility] ED due to elevated BP. Pt request cb.

## 2017-06-13 NOTE — Telephone Encounter (Signed)
I never got this request. Looks like it was sent to [facility], not me although I'm PCP?  plz notify I've filled as 55m bid, but I believe she was taking 566min am and 2589mn pm? Would verify with patient.

## 2017-06-13 NOTE — Telephone Encounter (Signed)
PLEASE NOTE: All timestamps contained within this report are represented as [place] Standard Time. CONFIDENTIALTY NOTICE: This fax transmission is intended only for the addressee. It contains information that is legally privileged, confidential or otherwise protected from use or disclosure. If you are not the intended recipient, you are strictly prohibited from reviewing, disclosing, copying using or disseminating any of this information or taking any action in reliance on or regarding this information. If you have received this fax in error, please notify us immediately by telephone so that we can arrange for its return to [place]. Phone: [phone], Toll-Free: [phone], Fax: [phone] Page: 1 of 2 Call Id: [id number] [place] Patient Name: Sharon [name] Gender: Female DOB: 05-Nov-1955 Age: 61 Y 49 M 19 D Return Phone Number: [phone] (Primary) [place] [address] Client [place] Night - Client Client Site [place] Physician Ria Bush - MD Contact Type Call Who Is Calling Patient / Member / Family / Caregiver Call Type Triage / Clinical Relationship To Patient Self Return Phone Number [phone] (Primary) Chief Complaint Blood Pressure High Reason for Call Symptomatic / Request for [place] stated they have been trying to get her blood pressure medication for a week. Her bp is now 171/100 and pulse is 105. She has a headache and nausea. Translation No Nurse Assessment Nurse: Elissa Hefty, RN, Anderson Malta Date/Time Eilene Ghazi Time): 06/13/2017 12:05:56 AM Confirm and document reason for call. If symptomatic, describe symptoms. ---Caller states BP keeps going up took her last dose of medications last night. 171/100 pulse is 105 taken 15 mins ago Does the patient have  any new or worsening symptoms? ---Yes Will a triage be completed? ---Yes Related visit to physician within the last 2 weeks? ---No Does the PT have any chronic conditions? (i.e. diabetes, asthma, etc.) ---Yes List chronic conditions. ---HTN, past stroke, Is this a behavioral health or substance abuse call? ---No Guidelines Guideline Title Affirmed Question Affirmed Notes Nurse Date/Time (Eastern Time) High Blood Pressure [0] Systolic BP >= 998 OR Diastolic >= 338 AND [2] cardiac or neurologic symptoms (e.g., chest pain, difficulty breathing, unsteady gait, blurred vision) Elissa Hefty, RN, Anderson Malta 06/13/2017 12:08:05 AM Disp. Time Eilene Ghazi Time) Disposition Final User 06/13/2017 12:12:18 AM Go to ED Now Yes Elissa Hefty, RN, Anderson Malta PLEASE NOTE: All timestamps contained within this report are represented as [place] Standard Time. CONFIDENTIALTY NOTICE: This fax transmission is intended only for the addressee. It contains information that is legally privileged, confidential or otherwise protected from use or disclosure. If you are not the intended recipient, you are strictly prohibited from reviewing, disclosing, copying using or disseminating any of this information or taking any action in reliance on or regarding this information. If you have received this fax in error, please notify us immediately by telephone so that we can arrange for its return to [place]. Phone: [phone], Toll-Free: [phone], Fax: [phone] Page: 2 of 2 Call Id: [id number] [place] Disagree/Comply Comply Caller Understands Yes PreDisposition InappropriateToAsk Care Advice Given Per Guideline GO TO ED NOW: You need to be seen in the Emergency Department. Go to the ER at ___________ [place] now. Drive carefully. NOTE TO TRIAGER - DRIVING: * Another adult should drive. * If immediate transportation is not available via car or taxi, then the patient should be instructed to call [id number]. CARE ADVICE given per  High Blood Pressure (Adult) guideline. CALL EMS 911  IF: * Patient passes out, starts acting confused or becomes too weak to stand. * You become worse. Referrals [facility] - ED

## 2017-06-13 NOTE — ED Triage Notes (Signed)
Patient reports high blood pressure (171/100) and elevated heart rate (105) at home.  Patient also reports chest pain states she thinks that might be due to anxiety.

## 2017-06-13 NOTE — Telephone Encounter (Signed)
Noted. Metoprolol refilled. ER records reviewed.

## 2017-06-13 NOTE — Telephone Encounter (Signed)
Per chart review tab pt is at [facility] ED. 

## 2017-06-24 ENCOUNTER — Encounter: Payer: Self-pay | Admitting: Family Medicine

## 2017-07-21 ENCOUNTER — Emergency Department
Admission: EM | Admit: 2017-07-21 | Discharge: 2017-07-22 | Disposition: A | Discharge status: Home / Self Care | Payer: Self-pay | Attending: Emergency Medicine | Admitting: Emergency Medicine

## 2017-07-21 ENCOUNTER — Encounter: Payer: Self-pay | Admitting: Emergency Medicine

## 2017-07-21 ENCOUNTER — Emergency Department: Payer: Self-pay

## 2017-07-21 DIAGNOSIS — Z7982 Long term (current) use of aspirin: Secondary | ICD-10-CM | POA: Insufficient documentation

## 2017-07-21 DIAGNOSIS — M79605 Pain in left leg: Secondary | ICD-10-CM

## 2017-07-21 DIAGNOSIS — Z79899 Other long term (current) drug therapy: Secondary | ICD-10-CM | POA: Insufficient documentation

## 2017-07-21 DIAGNOSIS — I10 Essential (primary) hypertension: Secondary | ICD-10-CM | POA: Insufficient documentation

## 2017-07-21 DIAGNOSIS — Z8541 Personal history of malignant neoplasm of cervix uteri: Secondary | ICD-10-CM | POA: Insufficient documentation

## 2017-07-21 DIAGNOSIS — Z87891 Personal history of nicotine dependence: Secondary | ICD-10-CM | POA: Insufficient documentation

## 2017-07-21 DIAGNOSIS — R2242 Localized swelling, mass and lump, left lower limb: Secondary | ICD-10-CM | POA: Insufficient documentation

## 2017-07-21 NOTE — Discharge Instructions (Signed)
Your ultrasound does not show a blood clot today.  Follow-up with your orthopedic surgeon as scheduled; he may repeat an ultrasound at that time based on your symptoms.  Return to the ER for worsening symptoms, persistent vomiting, difficulty breathing or other concerns.

## 2017-07-21 NOTE — ED Notes (Signed)
Pt transported to US

## 2017-07-21 NOTE — ED Provider Notes (Signed)
[facility] Emergency Department Provider Note   ____________________________________________   First MD Initiated Contact with Patient 07/21/17 2313     (approximate)  I have reviewed the triage vital signs and the nursing notes.   HISTORY  Chief Complaint Joint Swelling    HPI Sharon [name] is a 61 y.o. female who presents to the ED from home with a chief complaint of left ankle swelling and bruising.  Patient had left meniscus surgery on 11/7 and was sent to the ED by her orthopedist to rule out a DVT.  Patient had follow-up appointment on 11/21; healing fine.  Awoke this morning and noted bruising and swelling to her left ankle which has improved throughout the day because she was able to keep it elevated.  States she has been on the leg more recently due to the Thanksgiving holiday.  Denies associated fever, chills, chest pain, shortness of breath, abdominal pain, nausea, vomiting.  Denies recent travel or trauma.   Past Medical History:  Diagnosis Date  . Anxiety and depression   . Arthritis   . B12 deficiency    pernicious anemia - monthly b12 shots  . Cervical cancer ([place]) 12/2008   s/p hysterectomy  . Crohn's disease ([place]) 2000   h/o stenotic crohn's ileitis, active in TI s/p resections 2000, 2016 PPD neg ([facility] Dr. [name]); has been on cimzia, remicade, 6MP, entocort, now stable on Entyvio ([facility])  . Genital warts   . GERD (gastroesophageal reflux disease)    severe, daily sxs if off PPI  . Hepatic steatosis 04/2011   diffuse on CT, 64m gallbladder polyp  . History of anemia    attributed to crohn's  . History of chicken pox   . History of syphilis 1980s  . HTN (hypertension)   . Migraines    and frequent other headaches (sinus or stress)  . Scalp psoriasis   . Sensorineural hearing loss of both ears 12/2012   high freq  . Takotsubo syndrome 07/2012   cath 08/01/12, normal coronaries, LVEF 65%  . TIA (transient  ischemic attack) 05/2010   [facility]- TIA vs complex migraine (w/u negative - carotids, echo, TC doppler, and MRI normal)  . Tobacco abuse     Patient Active Problem List   Diagnosis Date Noted  . Atypical chest pain 04/30/2016  . Hematuria 01/01/2016  . Hyperglycemia 05/09/2013  . HLD (hyperlipidemia) 02/12/2013  . Diarrhea 12/18/2012  . Right knee pain 11/13/2012  . Takotsubo syndrome   . Chest pain 08/02/2012  . Bilateral hearing loss 06/27/2012  . GERD (gastroesophageal reflux disease)   . Skin rash 04/27/2012  . Right shoulder pain 03/13/2012  . History of pneumonia 01/18/2012  . Headache 10/13/2011  . Healthcare maintenance 08/16/2011  . Leukopenia 08/16/2011  . Vitamin B12 deficiency   . HTN (hypertension)   . Migraines   . Cro[place]'s disease ([place]   . Anxiety and depression   . Ex-smoker   . TIA (transient ischemic attack)   . Hepatic steatosis 04/30/2011    Past Surgical History:  Procedure Laterality Date  . abd [place] 04/2011   diffuse hepatic steatosis, 531mpolyp in gallbaldder fundus, no stones, mod abd ath  . APPENDECTOMY  1998  . CARDIAC CATHETERIZATION  07/2012   normal LV fxn, widely patent coronaries  . CERVICAL BIOPSY  W/ LOOP ELECTRODE EXCISION  10/2008  . COLON RESECTION  10/2014   removal of scar tissue colon from prior  surgery ([facility])  . COLONOSCOPY  10/2008   ileo-colonic anastomosis, ielocolonic crohn's  . COLONOSCOPY  05/2011   ileo-colonic anastomosis normal, normal mucosa  . COLONOSCOPY  10/15/2012   focal inflammation at anastomosis, no active crohn's, planning MR enterograph [name])  . COLONOSCOPY  09/2014   [facility]  . ESOPHAGOGASTRODUODENOSCOPY  05/2011   nl esophagus, gastritis, nl duodenum  . hospitalization  05/2010   TIA vs complex migraine - w/u negative - head CT, MRI/MRA, carotid dopplers, echo.  treated with ASA and tramadol  . LEFT HEART CATHETERIZATION WITH CORONARY ANGIOGRAM N/A 08/01/2012   Procedure: LEFT HEART  CATHETERIZATION WITH CORONARY ANGIOGRAM;  Surgeon: Sherren Mocha, MD;  Location: [facility] CATH LAB;  Service: Cardiovascular;  Laterality: N/A;  . RIGHT COLECTOMY  2000   crohn's disease, ileocecal resection  . SHOULDER ARTHROSCOPY W/ ROTATOR CUFF REPAIR Right 05/31/2012   [facility] Tamera Punt)  . US ECHOCARDIOGRAPHY  05/2010   nl LV fxn ,EF 60%  . VAGINAL HYSTERECTOMY  12/2008   [facility]VH/BSO    Prior to Admission medications   Medication Sig Start Date End Date Taking? Authorizing Provider  amitriptyline (ELAVIL) 50 MG tablet Take 1.5 tablets (75 mg total) by mouth at bedtime. 12/27/16   Ria Bush, MD  aspirin EC 81 MG tablet Take 1 tablet (81 mg total) by mouth daily. Over the counter Patient not taking: Reported on 12/27/2016 05/01/16   Rai, Vernelle Emerald, MD  citalopram (CELEXA) 40 MG tablet Take 1 tablet (40 mg total) by mouth daily. 01/04/17   Ria Bush, MD  cyanocobalamin (,VITAMIN B-12,) 1000 MCG/ML injection Inject 1,000 mcg into the muscle every 30 (thirty) days.    [provider]  diphenhydrAMINE (BENADRYL) 25 mg capsule Take 12.5 mg by mouth at bedtime as needed for sleep.    [provider]  metoprolol tartrate (LOPRESSOR) 50 MG tablet TAKE ONE TABLET BY MOUTH TWICE DAILY 06/13/17   Ria Bush, MD  nitroGLYCERIN (NITROSTAT) 0.4 MG SL tablet Place 1 tablet (0.4 mg total) under the tongue every 5 (five) minutes as needed for chest pain. Patient not taking: Reported on 12/27/2016 05/01/16   Rai, Vernelle Emerald, MD  ondansetron (ZOFRAN-ODT) 8 MG disintegrating tablet Take 1 tablet (8 mg total) by mouth every 8 (eight) hours as needed for nausea. 12/27/16   Ria Bush, MD  pantoprazole (PROTONIX) 20 MG tablet Take 1 tablet (20 mg total) by mouth daily. 12/27/16   Ria Bush, MD  vedolizumab (ENTYVIO) 300 MG injection Inject into the vein every 8 (eight) weeks.     [provider]    Allergies Infliximab; Codeine; Flagyl [metronidazole];  Hydrocodone; Ibuprofen; and Remeron [mirtazapine]  Family History  Problem Relation Age of Onset  . Crohn's disease Daughter        crohn's, ulcerative colitis  . Ulcerative colitis Daughter   . Stroke Mother   . Hypertension Mother   . Hyperlipidemia Mother   . Hypertension Father   . Crohn's disease Father   . Crohn's disease Sister   . Cancer Sister        ovarian  . Crohn's disease Paternal Aunt   . Crohn's disease Paternal Uncle   . Diabetes Neg Hx     Social History Social History   Tobacco Use  . Smoking status: Former Smoker    Packs/day: 0.33    Years: 10.00    Pack years: 3.30    Types: Cigarettes    Last attempt to quit: 11/14/2016  Years since quitting: 0.6  . Smokeless tobacco: Never Used  Substance Use Topics  . Alcohol use: No    Comment: remote EtOh use  . Drug use: No    Comment: extensive drug use, remotely    Review of Systems  Constitutional: No fever/chills. Eyes: No visual changes. ENT: No sore throat. Cardiovascular: Denies chest pain. Respiratory: Denies shortness of breath. Gastrointestinal: No abdominal pain.  No nausea, no vomiting.  No diarrhea.  No constipation. Genitourinary: Negative for dysuria. Musculoskeletal: Positive for left ankle bruising and swelling.  Negative for back pain. Skin: Negative for rash. Neurological: Negative for headaches, focal weakness or numbness.   ____________________________________________   PHYSICAL EXAM:  VITAL SIGNS: ED Triage Vitals  Enc Vitals Group     BP 07/21/17 2156 109/79     Pulse Rate 07/21/17 2156 67     Resp 07/21/17 2156 16     Temp 07/21/17 2156 98 F (36.7 C)     Temp Source 07/21/17 2156 Oral     SpO2 07/21/17 2156 98 %     Weight 07/21/17 2157 174 lb (78.9 kg)     Height 07/21/17 2157 5' 1"  (1.549 m)     Head Circumference --      Peak Flow --      Pain Score 07/21/17 2156 3     Pain Loc --      Pain Edu? --      Excl. in [place]? --     Constitutional: Alert and  oriented. Well appearing and in no acute distress. Eyes: Conjunctivae are normal. PERRL. EOMI. Head: Atraumatic. Nose: No congestion/rhinnorhea. Mouth/Throat: Mucous membranes are moist.  Oropharynx non-erythematous. Neck: No stridor.   Cardiovascular: Normal rate, regular rhythm. Grossly normal heart sounds.  Good peripheral circulation. Respiratory: Normal respiratory effort.  No retractions. Lungs CTAB. Gastrointestinal: Soft and nontender. No distention. No abdominal bruits. No CVA tenderness. Musculoskeletal: Left knee incisions clean/dry/intact. Left calf 34.5cm, right calf 31cm.  2+ distal pulses.  Slight bruising to left medial malleolus.  No significant swelling noted.  Left calf is supple.  Symmetrically warm limb without evidence for ischemia. Neurologic:  Normal speech and language. No gross focal neurologic deficits are appreciated. No gait instability. Skin:  Skin is warm, dry and intact. No rash noted. Psychiatric: Mood and affect are normal. Speech and behavior are normal.  ____________________________________________   LABS (all labs ordered are listed, but only abnormal results are displayed)  Labs Reviewed - No data to display ____________________________________________  EKG  None ____________________________________________  RADIOLOGY  US Venous Img Lower Unilateral Left  Result Date: 07/21/2017 CLINICAL DATA:  Left lower extremity pain and swelling. Left knee surgery earlier this month. Provided history of PE/DVT history. EXAM: LEFT LOWER EXTREMITY VENOUS DOPPLER ULTRASOUND TECHNIQUE: Gray-scale sonography with graded compression, as well as color Doppler and duplex ultrasound were performed to evaluate the lower extremity deep venous systems from the level of the common femoral vein and including the common femoral, femoral, profunda femoral, popliteal and calf veins including the posterior tibial, peroneal and gastrocnemius veins when visible. The superficial  great saphenous vein was also interrogated. Spectral Doppler was utilized to evaluate flow at rest and with distal augmentation maneuvers in the common femoral, femoral and popliteal veins. COMPARISON:  None. FINDINGS: Contralateral Common Femoral Vein: Respiratory phasicity is normal and symmetric with the symptomatic side. No evidence of thrombus. Normal compressibility. Common Femoral Vein: No evidence of thrombus. Normal compressibility, respiratory phasicity and response to augmentation. Saphenofemoral Junction:  No evidence of thrombus. Normal compressibility and flow on color Doppler imaging. Profunda Femoral Vein: No evidence of thrombus. Normal compressibility and flow on color Doppler imaging. Femoral Vein: No evidence of thrombus. Normal compressibility, respiratory phasicity and response to augmentation. Popliteal Vein: No evidence of thrombus. Normal compressibility, respiratory phasicity and response to augmentation. Calf Veins: No evidence of thrombus. Normal compressibility and flow on color Doppler imaging. Superficial Great Saphenous Vein: No evidence of thrombus. Normal compressibility. Venous Reflux:  None. Other Findings:  None. IMPRESSION: No evidence of deep venous thrombosis in the left lower extremity. Electronically Signed   By: Jeb Levering M.D.   On: 07/21/2017 23:20    ____________________________________________   PROCEDURES  Procedure(s) performed: None  Procedures  Critical Care performed: No  ____________________________________________   INITIAL IMPRESSION / ASSESSMENT AND PLAN / ED COURSE  As part of my medical decision making, I reviewed the following data within the [facility] notes reviewed and incorporated, Radiograph reviewed and Notes from prior ED visits.   61 year old female who presents with left ankle bruising and swelling approximately 2 weeks status post left meniscus surgery. Differential diagnosis includes but is not  limited to DVT, musculoskeletal, peripheral edema, cellulitis.  Sent by orthopedist to rule out DVT.  Ultrasound negative for DVT.  Patient has upcoming appointment with her orthopedist.  Strict return precautions given.  Patient verbalizes understanding and agrees with plan of care.      ____________________________________________   FINAL CLINICAL IMPRESSION(S) / ED DIAGNOSES  Final diagnoses:  Pain of left lower extremity     ED Discharge Orders    None       Note:  This document was prepared using Dragon voice recognition software and may include unintentional dictation errors.    Paulette Blanch, MD 07/22/17 [phone]

## 2017-07-21 NOTE — ED Triage Notes (Signed)
Pt states had left meniscus surgery on 07/05/2017. Pt states she began to have swelling and bruising noted to medial left ankle yesterday. Pt states her orthopedist sent her for rule out dvt.

## 2017-08-14 ENCOUNTER — Encounter: Payer: Self-pay | Admitting: Family Medicine

## 2017-08-17 ENCOUNTER — Encounter: Payer: Self-pay | Admitting: Family Medicine

## 2017-08-20 NOTE — Telephone Encounter (Signed)
Letter written and in Sharon [name]'s box.  

## 2017-08-20 NOTE — Telephone Encounter (Signed)
Responded to other message.

## 2017-08-21 NOTE — Telephone Encounter (Signed)
Spoke with pt notifying her the note is ready to pick up. [Placed note at front office.]

## 2017-08-30 ENCOUNTER — Telehealth: Payer: Self-pay | Admitting: Family Medicine

## 2017-08-30 NOTE — Telephone Encounter (Signed)
Copied from [place] [phone]. Topic: General - Other >> Aug 30, 2017  3:51 PM Lolita Rieger, [place] wrote: Reason for CRM: Pt would like the letter that was written for her mailed to her at the address in her chart

## 2017-08-31 NOTE — Telephone Encounter (Signed)
Pulled letter from front office and mailed to pt's address on file.

## 2017-09-02 ENCOUNTER — Encounter: Payer: Self-pay | Admitting: Emergency Medicine

## 2017-09-02 DIAGNOSIS — I5181 Takotsubo syndrome: Secondary | ICD-10-CM | POA: Insufficient documentation

## 2017-09-02 DIAGNOSIS — F17210 Nicotine dependence, cigarettes, uncomplicated: Secondary | ICD-10-CM | POA: Insufficient documentation

## 2017-09-02 DIAGNOSIS — N390 Urinary tract infection, site not specified: Secondary | ICD-10-CM | POA: Insufficient documentation

## 2017-09-02 DIAGNOSIS — I10 Essential (primary) hypertension: Secondary | ICD-10-CM | POA: Insufficient documentation

## 2017-09-02 DIAGNOSIS — K5641 Fecal impaction: Secondary | ICD-10-CM | POA: Insufficient documentation

## 2017-09-02 DIAGNOSIS — F329 Major depressive disorder, single episode, unspecified: Secondary | ICD-10-CM | POA: Insufficient documentation

## 2017-09-02 DIAGNOSIS — Z8541 Personal history of malignant neoplasm of cervix uteri: Secondary | ICD-10-CM | POA: Insufficient documentation

## 2017-09-02 DIAGNOSIS — K50911 Crohn's disease, unspecified, with rectal bleeding: Secondary | ICD-10-CM | POA: Insufficient documentation

## 2017-09-02 DIAGNOSIS — F419 Anxiety disorder, unspecified: Secondary | ICD-10-CM | POA: Insufficient documentation

## 2017-09-02 DIAGNOSIS — Z79899 Other long term (current) drug therapy: Secondary | ICD-10-CM | POA: Insufficient documentation

## 2017-09-02 LAB — COMPREHENSIVE METABOLIC PANEL
ALT: 25 U/L (ref 14–54)
AST: 41 U/L (ref 15–41)
Albumin: 4.3 g/dL (ref 3.5–5.0)
Alkaline Phosphatase: 115 U/L (ref 38–126)
Anion gap: 10 (ref 5–15)
BUN: 9 mg/dL (ref 6–20)
CO2: 24 mmol/L (ref 22–32)
CREATININE: 0.82 mg/dL (ref 0.44–1.00)
Calcium: 8.9 mg/dL (ref 8.9–10.3)
Chloride: 104 mmol/L (ref 101–111)
Glucose, Bld: 90 mg/dL (ref 65–99)
Potassium: 3.5 mmol/L (ref 3.5–5.1)
Sodium: 138 mmol/L (ref 135–145)
Total Bilirubin: 0.3 mg/dL (ref 0.3–1.2)
Total Protein: 8 g/dL (ref 6.5–8.1)

## 2017-09-02 LAB — CBC
HCT: 38.5 % (ref 35.0–47.0)
Hemoglobin: 12.8 g/dL (ref 12.0–16.0)
MCH: 29.3 pg (ref 26.0–34.0)
MCHC: 33.1 g/dL (ref 32.0–36.0)
MCV: 88.5 fL (ref 80.0–100.0)
PLATELETS: 234 10*3/uL (ref 150–440)
RBC: 4.36 MIL/uL (ref 3.80–5.20)
RDW: 13.9 % (ref 11.5–14.5)
WBC: 12.8 10*3/uL — AB (ref 3.6–11.0)

## 2017-09-02 LAB — URINALYSIS, COMPLETE (UACMP) WITH MICROSCOPIC
BILIRUBIN URINE: NEGATIVE
Bacteria, UA: NONE SEEN
GLUCOSE, UA: NEGATIVE mg/dL
KETONES UR: NEGATIVE mg/dL
Nitrite: NEGATIVE
PH: 5 (ref 5.0–8.0)
Protein, ur: NEGATIVE mg/dL
Specific Gravity, Urine: 1.017 (ref 1.005–1.030)

## 2017-09-02 NOTE — ED Triage Notes (Signed)
Patient states that she has a history of chron's. Patient states that she has not had a bowel movement in 3-4 days. Patient states that she has had multiple episodes of rectal bleeding today with dark red blood.

## 2017-09-03 ENCOUNTER — Emergency Department: Payer: Self-pay

## 2017-09-03 ENCOUNTER — Emergency Department
Admission: EM | Admit: 2017-09-03 | Discharge: 2017-09-03 | Disposition: A | Discharge status: Home / Self Care | Payer: Self-pay | Attending: Emergency Medicine | Admitting: Emergency Medicine

## 2017-09-03 ENCOUNTER — Encounter: Payer: Self-pay | Admitting: Radiology

## 2017-09-03 DIAGNOSIS — K5900 Constipation, unspecified: Secondary | ICD-10-CM

## 2017-09-03 DIAGNOSIS — K625 Hemorrhage of anus and rectum: Secondary | ICD-10-CM

## 2017-09-03 DIAGNOSIS — K5641 Fecal impaction: Secondary | ICD-10-CM

## 2017-09-03 DIAGNOSIS — N390 Urinary tract infection, site not specified: Secondary | ICD-10-CM

## 2017-09-03 DIAGNOSIS — K50911 Crohn's disease, unspecified, with rectal bleeding: Secondary | ICD-10-CM

## 2017-09-03 LAB — CBC WITH DIFFERENTIAL/PLATELET
Basophils Absolute: 0.1 10*3/uL (ref 0–0.1)
Basophils Relative: 1 %
Eosinophils Absolute: 0.2 10*3/uL (ref 0–0.7)
Eosinophils Relative: 2 %
HEMATOCRIT: 35.4 % (ref 35.0–47.0)
Hemoglobin: 11.8 g/dL — ABNORMAL LOW (ref 12.0–16.0)
LYMPHS ABS: 2.3 10*3/uL (ref 1.0–3.6)
LYMPHS PCT: 23 %
MCH: 30 pg (ref 26.0–34.0)
MCHC: 33.3 g/dL (ref 32.0–36.0)
MCV: 89.9 fL (ref 80.0–100.0)
Monocytes Absolute: 0.6 10*3/uL (ref 0.2–0.9)
Monocytes Relative: 6 %
NEUTROS ABS: 6.9 10*3/uL — AB (ref 1.4–6.5)
NEUTROS PCT: 68 %
Platelets: 215 10*3/uL (ref 150–440)
RBC: 3.93 MIL/uL (ref 3.80–5.20)
RDW: 14 % (ref 11.5–14.5)
WBC: 10.1 10*3/uL (ref 3.6–11.0)

## 2017-09-03 MED ORDER — MINERAL OIL RE ENEM
1.0000 | ENEMA | Freq: Once | RECTAL | Status: AC
  Administered 2017-09-03: 1 via RECTAL

## 2017-09-03 MED ORDER — IOPAMIDOL (ISOVUE-300) INJECTION 61%
30.0000 mL | Freq: Once | INTRAVENOUS | Status: AC | PRN
  Administered 2017-09-03: 30 mL via ORAL

## 2017-09-03 MED ORDER — CIPROFLOXACIN HCL 500 MG PO TABS
500.0000 mg | ORAL_TABLET | Freq: Once | ORAL | Status: AC
  Administered 2017-09-03: 500 mg via ORAL
  Filled 2017-09-03: qty 1

## 2017-09-03 MED ORDER — LACTULOSE 10 GM/15ML PO SOLN: 20.0000 g | Freq: Every day | ORAL | 0 refills | Status: DC | PRN

## 2017-09-03 MED ORDER — ONDANSETRON HCL 4 MG/2ML IJ SOLN
4.0000 mg | Freq: Once | INTRAMUSCULAR | Status: AC
  Administered 2017-09-03: 4 mg via INTRAVENOUS
  Filled 2017-09-03: qty 2

## 2017-09-03 MED ORDER — IOPAMIDOL (ISOVUE-300) INJECTION 61%
100.0000 mL | Freq: Once | INTRAVENOUS | Status: AC | PRN
  Administered 2017-09-03: 100 mL via INTRAVENOUS

## 2017-09-03 MED ORDER — CIPROFLOXACIN HCL 500 MG PO TABS: 500.0000 mg | ORAL_TABLET | Freq: Two times a day (BID) | ORAL | 0 refills | Status: DC

## 2017-09-03 NOTE — ED Notes (Signed)
Patient digitally disimpacted per MD VO by this RN

## 2017-09-03 NOTE — ED Notes (Signed)
Patient called out, they have finished their oral contrast - CT called to come get patient.

## 2017-09-03 NOTE — Discharge Instructions (Signed)
1. Take antibiotic as prescribed (Cipro 567m twice daily x 5 days). 2. You may take laxative (Lactulose) as needed for bowel movements. 3. Take a daily stool softener. 4. Return to the ER for worsening symptoms, persistent vomiting, difficulty breathing or other concerns.

## 2017-09-03 NOTE — ED Provider Notes (Signed)
[facility] Emergency Department Provider Note   ____________________________________________   First MD Initiated Contact with Patient 09/03/17 0246     (approximate)  I have reviewed the triage vital signs and the nursing notes.   HISTORY  Chief Complaint GI Bleeding; Abdominal Pain; and Constipation    HPI Sharon [name] is a 62 y.o. female who presents to the ED from home with a chief complaint of rectal bleeding.  Patient has a history of Crohn's disease, on Entyvio infusions, last infusion 08/16/2017.  Also history of colectomy x 2.  States she has not had any stool in her bowel movements for the past 5 days.  For the past 3 days she has been passing dark red blood clots. Occasional lightheadedness and nausea.  Denies fever, chills, chest pain, shortness of breath, abdominal pain.  Denies recent travel or trauma.  Phoned her GI at [facility] last evening who referred her to the ED for imaging.   Past Medical History:  Diagnosis Date  . Anxiety and depression   . Arthritis   . B12 deficiency    pernicious anemia - monthly b12 shots  . Cervical cancer ([place]) 12/2008   s/p hysterectomy  . Crohn's disease ([place]) 2000   h/o stenotic crohn's ileitis, active in TI s/p resections 2000, 2016 PPD neg ([facility] Dr. [name]); has been on cimzia, remicade, 6MP, entocort, now stable on Entyvio ([facility])  . Genital warts   . GERD (gastroesophageal reflux disease)    severe, daily sxs if off PPI  . Hepatic steatosis 04/2011   diffuse on CT, 21m gallbladder polyp  . History of anemia    attributed to crohn's  . History of chicken pox   . History of syphilis 1980s  . HTN (hypertension)   . Migraines    and frequent other headaches (sinus or stress)  . Scalp psoriasis   . Sensorineural hearing loss of both ears 12/2012   high freq  . Takotsubo syndrome 07/2012   cath 08/01/12, normal coronaries, LVEF 65%  . TIA (transient ischemic attack)  05/2010   at [facility]- TIA vs complex migraine (w/u negative - carotids, echo, TC doppler, and MRI normal)  . Tobacco abuse     Patient Active Problem List   Diagnosis Date Noted  . Atypical chest pain 04/30/2016  . Hematuria 01/01/2016  . Hyperglycemia 05/09/2013  . HLD (hyperlipidemia) 02/12/2013  . Diarrhea 12/18/2012  . Right knee pain 11/13/2012  . Takotsubo syndrome   . Chest pain 08/02/2012  . Bilateral hearing loss 06/27/2012  . GERD (gastroesophageal reflux disease)   . Skin rash 04/27/2012  . Right shoulder pain 03/13/2012  . History of pneumonia 01/18/2012  . Headache 10/13/2011  . Healthcare maintenance 08/16/2011  . Leukopenia 08/16/2011  . Vitamin B12 deficiency   . HTN (hypertension)   . Migraines   . [place]   . Anxiety and depression   . Ex-smoker   . TIA (transient ischemic attack)   . Hepatic steatosis 04/30/2011    Past Surgical History:  Procedure Laterality Date  . abd [place]orea 04/2011   diffuse hepatic steatosis, 533mpolyp in gallbaldder fundus, no stones, mod abd ath  . APPENDECTOMY  1998  . CARDIAC CATHETERIZATION  07/2012   normal LV fxn, widely patent coronaries  . CERVICAL BIOPSY  W/ LOOP ELECTRODE EXCISION  10/2008  . COLON RESECTION  10/2014   removal of scar tissue colon from prior surgery ([facility]  at [facility])  . COLONOSCOPY  10/2008   ileo-colonic anastomosis, ielocolonic crohn's  . COLONOSCOPY  05/2011   ileo-colonic anastomosis normal, normal mucosa  . COLONOSCOPY  10/15/2012   focal inflammation at anastomosis, no active crohn's, planning MR enterograph [name])  . COLONOSCOPY  09/2014   [facility]  . ESOPHAGOGASTRODUODENOSCOPY  05/2011   nl esophagus, gastritis, nl duodenum  . hospitalization  05/2010   TIA vs complex migraine - w/u negative - head CT, MRI/MRA, carotid dopplers, echo.  treated with ASA and tramadol  . KNEE SURGERY Left   . LEFT HEART CATHETERIZATION WITH CORONARY ANGIOGRAM N/A 08/01/2012   Procedure: LEFT HEART  CATHETERIZATION WITH CORONARY ANGIOGRAM;  Surgeon: Sherren Mocha, MD;  Location: [facility] CATH LAB;  Service: Cardiovascular;  Laterality: N/A;  . RIGHT COLECTOMY  2000   crohn's disease, ileocecal resection  . SHOULDER ARTHROSCOPY W/ ROTATOR CUFF REPAIR Right 05/31/2012   [facility] Tamera Punt)  . US ECHOCARDIOGRAPHY  05/2010   nl LV fxn ,EF 60%  . VAGINAL HYSTERECTOMY  12/2008   LAVH/BSO    Prior to Admission medications   Medication Sig Start Date End Date Taking? Authorizing Provider  amitriptyline (ELAVIL) 50 MG tablet Take 1.5 tablets (75 mg total) by mouth at bedtime. 12/27/16   Ria Bush, MD  aspirin EC 81 MG tablet Take 1 tablet (81 mg total) by mouth daily. Over the counter Patient not taking: Reported on 12/27/2016 05/01/16   Rai, Vernelle Emerald, MD  citalopram (CELEXA) 40 MG tablet Take 1 tablet (40 mg total) by mouth daily. 01/04/17   Ria Bush, MD  cyanocobalamin (,VITAMIN B-12,) 1000 MCG/ML injection Inject 1,000 mcg into the muscle every 30 (thirty) days.    [provider]  diphenhydrAMINE (BENADRYL) 25 mg capsule Take 12.5 mg by mouth at bedtime as needed for sleep.    [provider]  metoprolol tartrate (LOPRESSOR) 50 MG tablet TAKE ONE TABLET BY MOUTH TWICE DAILY 06/13/17   Ria Bush, MD  nitroGLYCERIN (NITROSTAT) 0.4 MG SL tablet Place 1 tablet (0.4 mg total) under the tongue every 5 (five) minutes as needed for chest pain. Patient not taking: Reported on 12/27/2016 05/01/16   Rai, Vernelle Emerald, MD  ondansetron (ZOFRAN-ODT) 8 MG disintegrating tablet Take 1 tablet (8 mg total) by mouth every 8 (eight) hours as needed for nausea. 12/27/16   Ria Bush, MD  pantoprazole (PROTONIX) 20 MG tablet Take 1 tablet (20 mg total) by mouth daily. 12/27/16   Ria Bush, MD  vedolizumab (ENTYVIO) 300 MG injection Inject into the vein every 8 (eight) weeks.     [provider]    Allergies Infliximab; Codeine; Flagyl [metronidazole];  Hydrocodone; Ibuprofen; and Remeron [mirtazapine]  Family History  Problem Relation Age of Onset  . Crohn's disease Daughter        crohn's, ulcerative colitis  . Ulcerative colitis Daughter   . Stroke Mother   . Hypertension Mother   . Hyperlipidemia Mother   . Hypertension Father   . Crohn's disease Father   . Crohn's disease Sister   . Cancer Sister        ovarian  . Crohn's disease Paternal Aunt   . Crohn's disease Paternal Uncle   . Diabetes Neg Hx     Social History Social History   Tobacco Use  . Smoking status: Current Some Day Smoker    Packs/day: 0.33    Years: 10.00    Pack years: 3.30    Types: Cigarettes    Last  attempt to quit: 11/14/2016    Years since quitting: 0.8  . Smokeless tobacco: Never Used  Substance Use Topics  . Alcohol use: Yes    Comment: occ  . Drug use: No    Comment: extensive drug use, remotely    Review of Systems  Constitutional: No fever/chills. Eyes: No visual changes. ENT: No sore throat. Cardiovascular: Denies chest pain. Respiratory: Denies shortness of breath. Gastrointestinal: No abdominal pain.  Positive for nausea, no vomiting.  Positive for rectal bleeding.  No diarrhea.  No constipation. Genitourinary: Negative for dysuria. Musculoskeletal: Negative for back pain. Skin: Negative for rash. Neurological: Negative for headaches, focal weakness or numbness.   ____________________________________________   PHYSICAL EXAM:  VITAL SIGNS: ED Triage Vitals [09/02/17 2302]  Enc Vitals Group     BP (!) 149/70     Pulse Rate 88     Resp 18     Temp 98.3 F (36.8 C)     Temp Source Oral     SpO2 100 %     Weight 165 lb (74.8 kg)     Height 5' 2"  (1.575 m)     Head Circumference      Peak Flow      Pain Score 6     Pain Loc      Pain Edu?      Excl. in [place]?     Constitutional: Alert and oriented. Well appearing and in no acute distress. Eyes: Conjunctivae are normal. PERRL. EOMI. Head: Atraumatic. Nose: No  congestion/rhinnorhea. Mouth/Throat: Mucous membranes are moist.  Oropharynx non-erythematous. Neck: No stridor.   Cardiovascular: Normal rate, regular rhythm. Grossly normal heart sounds.  Good peripheral circulation. Respiratory: Normal respiratory effort.  No retractions. Lungs CTAB. Gastrointestinal: Soft and nontender to light or deep palpation. No distention. No abdominal bruits. No CVA tenderness. Musculoskeletal: No lower extremity tenderness nor edema.  No joint effusions. Neurologic:  Normal speech and language. No gross focal neurologic deficits are appreciated. No gait instability. Skin:  Skin is warm, dry and intact. No rash noted. Psychiatric: Mood and affect are normal. Speech and behavior are normal.  ____________________________________________   LABS (all labs ordered are listed, but only abnormal results are displayed)  Labs Reviewed  CBC - Abnormal; Notable for the following components:      Result Value   WBC 12.8 (*)    All other components within normal limits  URINALYSIS, COMPLETE (UACMP) WITH MICROSCOPIC - Abnormal; Notable for the following components:   Color, Urine YELLOW (*)    APPearance CLEAR (*)    Hgb urine dipstick MODERATE (*)    Leukocytes, UA SMALL (*)    Squamous Epithelial / LPF 0-5 (*)    All other components within normal limits  CBC WITH DIFFERENTIAL/PLATELET - Abnormal; Notable for the following components:   Hemoglobin 11.8 (*)    Neutro Abs 6.9 (*)    All other components within normal limits  COMPREHENSIVE METABOLIC PANEL  POC OCCULT BLOOD, ED   ____________________________________________  EKG  ED ECG REPORT I, Trinita Devlin J, the attending physician, personally viewed and interpreted this ECG.   Date: 09/03/2017  EKG Time: 2306  Rate: 84  Rhythm: normal EKG, normal sinus rhythm  Axis: Normal  Intervals:none  ST&T Change: Nonspecific  ____________________________________________  RADIOLOGY  Ct Abdomen Pelvis W  Contrast  Result Date: 09/03/2017 CLINICAL DATA:  62 y/o F; rectal bleeding. No bowel movement for 3-4 days. History of Crohn's disease. EXAM: CT ABDOMEN AND PELVIS WITH CONTRAST TECHNIQUE:  Multidetector CT imaging of the abdomen and pelvis was performed using the standard protocol following bolus administration of intravenous contrast. CONTRAST:  100 cc Isovue-300 COMPARISON:  01/14/2017 CT abdomen and pelvis. FINDINGS: Lower chest: No acute abnormality. Hepatobiliary: Hepatic steatosis. No focal liver abnormality is seen. No gallstones, gallbladder wall thickening, or biliary dilatation. Pancreas: Unremarkable. No pancreatic ductal dilatation or surrounding inflammatory changes. Spleen: Normal in size without focal abnormality. Adrenals/Urinary Tract: Adrenal glands are unremarkable. Kidneys are normal, without renal calculi, focal lesion, or hydronephrosis. Bladder is unremarkable. Stomach/Bowel: Stomach is within normal limits. Partial right colectomy. Normal appearance of small bowel. Large volume of stool in rectal vault and mild fat stranding within the mesorectal fat. Vascular/Lymphatic: Aortic atherosclerosis. No enlarged abdominal or pelvic lymph nodes. Reproductive: Status post hysterectomy. No adnexal masses. Other: Small paraumbilical hernia containing fat. Musculoskeletal: No acute or significant osseous findings. IMPRESSION: 1. Large volume of stool in rectum and mild edema within the mesorectal fat, possibly stercoral colitis. 2. Stable hepatic steatosis. 3. Stable small paraumbilical hernia containing fat. Electronically Signed   By: Kristine Garbe M.D.   On: 09/03/2017 03:01    ____________________________________________   PROCEDURES  Procedure(s) performed:   Rectal exam: External, reducible, non-thrombosed hemorrhoid.  Bloody streak noted on gloved finger.  Procedures  Critical Care performed: No  ____________________________________________   INITIAL IMPRESSION /  ASSESSMENT AND PLAN / ED COURSE  As part of my medical decision making, I reviewed the following data within the [facility] notes reviewed and incorporated, Labs reviewed, Old chart reviewed, Radiograph reviewed  and Notes from prior ED visits.   62 year old female with Crohn's disease on Entyvio infusions who presents with rectal bleeding. Differential diagnosis includes, but is not limited to, ovarian cyst, ovarian torsion, acute appendicitis, diverticulitis, urinary tract infection/pyelonephritis, endometriosis, bowel obstruction, colitis, renal colic, gastroenteritis, hernia, etc.  Laboratory and urinalysis results unremarkable.  Awaiting results of CT scan.  Will obtain repeat CBC, orthostatic vital signs, administer Zofran and reassess.   Clinical Course as of Sep 04 547  Sun Sep 03, 2017  0451 Patient resting no acute distress.  Orthostatic vital signs within normal limits.  Repeat H/H unremarkable.  Updated her of CT imaging results.  Recommend mineral oil enema with disimpaction for stercoral colitis.  Patient agreeable.  Will also cover her with a few days of Cipro for both colitis as well as mild UTI.  [JS]  [id number] Nursing administered mineral oil enema with good relief of patient's symptoms.  Large amount was disimpacted and passed after enema.  Patient feeling better.  Will discharge home with lactulose to use as needed, and she will follow-up with her GI doctor next week.  Strict return precautions given.  Patient verbalizes understanding and agrees to plan of care.  [JS]    Clinical Course User Index [JS] Paulette Blanch, MD     ____________________________________________   FINAL CLINICAL IMPRESSION(S) / ED DIAGNOSES  Final diagnoses:  Crohn's disease with rectal bleeding, unspecified gastrointestinal tract location [facility])  Constipation, unspecified constipation type  Fecal impaction [facility])  Rectal bleeding  Lower urinary tract infectious disease      ED Discharge Orders    None       Note:  This document was prepared using Dragon voice recognition software and may include unintentional dictation errors.    Paulette Blanch, MD 09/03/17 [phone]

## 2017-09-04 ENCOUNTER — Encounter: Payer: Self-pay | Admitting: Family Medicine

## 2017-10-30 DIAGNOSIS — K5080 Crohn's disease of both small and large intestine without complications: Secondary | ICD-10-CM | POA: Diagnosis not present

## 2017-11-22 ENCOUNTER — Encounter: Payer: Self-pay | Admitting: Family Medicine

## 2017-11-29 ENCOUNTER — Ambulatory Visit (INDEPENDENT_AMBULATORY_CARE_PROVIDER_SITE_OTHER): Payer: 59 | Admitting: Family Medicine

## 2017-11-29 ENCOUNTER — Encounter: Payer: Self-pay | Admitting: Family Medicine

## 2017-11-29 VITALS — BP 116/70 | HR 63 | Temp 98.6°F | Ht 61.5 in | Wt 164.5 lb

## 2017-11-29 DIAGNOSIS — K50919 Crohn's disease, unspecified, with unspecified complications: Secondary | ICD-10-CM

## 2017-11-29 DIAGNOSIS — A084 Viral intestinal infection, unspecified: Secondary | ICD-10-CM | POA: Diagnosis not present

## 2017-11-29 DIAGNOSIS — R509 Fever, unspecified: Secondary | ICD-10-CM

## 2017-11-29 LAB — POC INFLUENZA A&B (BINAX/QUICKVUE)
INFLUENZA A, POC: NEGATIVE
Influenza B, POC: NEGATIVE

## 2017-11-29 MED ORDER — ONDANSETRON HCL 4 MG PO TABS: 4.0000 mg | ORAL_TABLET | Freq: Three times a day (TID) | ORAL | 0 refills | Status: DC | PRN

## 2017-11-29 NOTE — Patient Instructions (Addendum)
Call to schedule well woman exam with OBGYN and mammogram at breast center.  Return at your convenience for fasting labs and physical.  I think you have the stomach flu - push fluids and rest. You look a little dehydrated so increase fluid intake. Bland diet, advance as tolerated. Ok continue tylenol. May take zofran for nausea.   Viral Gastroenteritis, Adult Viral gastroenteritis is also known as the stomach flu. This condition is caused by various viruses. These viruses can be passed from person to person very easily (are very contagious). This condition may affect your stomach, small intestine, and large intestine. It can cause sudden watery diarrhea, fever, and vomiting. Diarrhea and vomiting can make you feel weak and cause you to become dehydrated. You may not be able to keep fluids down. Dehydration can make you tired and thirsty, cause you to have a dry mouth, and decrease how often you urinate. Older adults and people with other diseases or a weak immune system are at higher risk for dehydration. It is important to replace the fluids that you lose from diarrhea and vomiting. If you become severely dehydrated, you may need to get fluids through an IV tube. What are the causes? Gastroenteritis is caused by various viruses, including rotavirus and norovirus. Norovirus is the most common cause in adults. You can get sick by eating food, drinking water, or touching a surface contaminated with one of these viruses. You can also get sick from sharing utensils or other personal items with an infected person. What increases the risk? This condition is more likely to develop in people:  Who have a weak defense system (immune system).  Who live with one or more children who are younger than 56 years old.  Who live in a nursing home.  Who go on cruise ships.  What are the signs or symptoms? Symptoms of this condition start suddenly 1-2 days after exposure to a virus. Symptoms may last a few days  or as long as a week. The most common symptoms are watery diarrhea and vomiting. Other symptoms include:  Fever.  Headache.  Fatigue.  Pain in the abdomen.  Chills.  Weakness.  Nausea.  Muscle aches.  Loss of appetite.  How is this diagnosed? This condition is diagnosed with a medical history and physical exam. You may also have a stool test to check for viruses or other infections. How is this treated? This condition typically goes away on its own. The focus of treatment is to restore lost fluids (rehydration). Your health care provider may recommend that you take an oral rehydration solution (ORS) to replace important salts and minerals (electrolytes) in your body. Severe cases of this condition may require giving fluids through an IV tube. Treatment may also include medicine to help with your symptoms. Follow these instructions at home: Follow instructions from your health care provider about how to care for yourself at home. Eating and drinking Follow these recommendations as told by your health care provider:  Take an ORS. This is a drink that is sold at pharmacies and retail stores.  Drink clear fluids in small amounts as you are able. Clear fluids include water, ice chips, diluted fruit juice, and low-calorie sports drinks.  Eat bland, easy-to-digest foods in small amounts as you are able. These foods include bananas, applesauce, rice, lean meats, toast, and crackers.  Avoid fluids that contain a lot of sugar or caffeine, such as energy drinks, sports drinks, and soda.  Avoid alcohol.  Avoid spicy or  fatty foods.  General instructions   Drink enough fluid to keep your urine clear or pale yellow.  Wash your hands often. If soap and water are not available, use hand sanitizer.  Make sure that all people in your household wash their hands well and often.  Take over-the-counter and prescription medicines only as told by your health care provider.  Rest at home  while you recover.  Watch your condition for any changes.  Take a warm bath to relieve any burning or pain from frequent diarrhea episodes.  Keep all follow-up visits as told by your health care provider. This is important. Contact a health care provider if:  You cannot keep fluids down.  Your symptoms get worse.  You have new symptoms.  You feel light-headed or dizzy.  You have muscle cramps. Get help right away if:  You have chest pain.  You feel extremely weak or you faint.  You see blood in your vomit.  Your vomit looks like coffee grounds.  You have bloody or black stools or stools that look like tar.  You have a severe headache, a stiff neck, or both.  You have a rash.  You have severe pain, cramping, or bloating in your abdomen.  You have trouble breathing or you are breathing very quickly.  Your heart is beating very quickly.  Your skin feels cold and clammy.  You feel confused.  You have pain when you urinate.  You have signs of dehydration, such as: ? Dark urine, very little urine, or no urine. ? Cracked lips. ? Dry mouth. ? Sunken eyes. ? Sleepiness. ? Weakness. This information is not intended to replace advice given to you by your health care provider. Make sure you discuss any questions you have with your health care provider. Document Released: 08/15/2005 Document Revised: 01/27/2016 Document Reviewed: 04/21/2015 Elsevier Interactive Patient Education  [employer].

## 2017-11-29 NOTE — Assessment & Plan Note (Signed)
Anticipate viral stomach bug - treat with fluids and rest. Supportive care reviewed. zofran PRN nausea. Update if not improving with treatment. Pt agrees with plan.

## 2017-11-29 NOTE — Progress Notes (Signed)
BP 116/70 (BP Location: Left Arm, Patient Position: Sitting, Cuff Size: Normal)   Pulse 63   Temp 98.6 F (37 C) (Oral)   Ht 5' 1.5" (1.562 m)   Wt 164 lb 8 oz (74.6 kg)   SpO2 98%   BMI 30.58 kg/m    CC: CPE --> converted to acute visit Subjective:    Patient ID: Sharon [name], female    DOB: [birth year], 62 y.o.   MRN: [id number]  HPI: Sharon [name] is a 62 y.o. female presenting on 11/29/2017 for Annual Exam and Fever (Started 11/26/17. Max 101. Also, has vomiting, diarrhea and body aches. Was exposed to flu at work.)   3d h/o malaise, fever (Tmax 101.1), nausea/vomiting, diarrhea and body aches. Abdominal pain/cramping. This feels different than crohn's disease flares. Today actually feels some better. Last diarrhea was this morning. No appetite. Drinking ginger ale. No URI sxs. Treating with tylenol. Flu exposure, stomach bug exposure and other sick contacts at work - works in [employer] care. No urinary symptoms  Preventative: Will return for this. Had colonoscopy 10/2017 - stable Due for mammogram - advised to call and schedule.  Well woman exam - Dr Toney Rakes (since retired) due. Flu shot year prevnar 2015, pneumovax 2013 Tdap 2013 Hep A/B - 2014, 2015, Hep B alone 05/2013   Relevant past medical, surgical, family and social history reviewed and updated as indicated. Interim medical history since our last visit reviewed. Allergies and medications reviewed and updated. Outpatient Medications Prior to Visit  Medication Sig Dispense Refill  . amitriptyline (ELAVIL) 50 MG tablet Take 1.5 tablets (75 mg total) by mouth at bedtime. 135 tablet 3  . aspirin EC 81 MG tablet Take 1 tablet (81 mg total) by mouth daily. Over the counter 30 tablet 3  . citalopram (CELEXA) 40 MG tablet Take 1 tablet (40 mg total) by mouth daily. 30 tablet 6  . cyanocobalamin (,VITAMIN B-12,) 1000 MCG/ML injection Inject 1,000 mcg into the muscle every 30 (thirty) days.    . diphenhydrAMINE (BENADRYL)  25 mg capsule Take 12.5 mg by mouth at bedtime as needed for sleep.    . metoprolol tartrate (LOPRESSOR) 50 MG tablet TAKE ONE TABLET BY MOUTH TWICE DAILY 180 tablet 3  . vedolizumab (ENTYVIO) 300 MG injection Inject into the vein every 8 (eight) weeks.     . nitroGLYCERIN (NITROSTAT) 0.4 MG SL tablet Place 1 tablet (0.4 mg total) under the tongue every 5 (five) minutes as needed for chest pain. (Patient not taking: Reported on 12/27/2016) 30 tablet 12  . ciprofloxacin (CIPRO) 500 MG tablet Take 1 tablet (500 mg total) by mouth 2 (two) times daily. 10 tablet 0  . lactulose (CHRONULAC) 10 GM/15ML solution Take 30 mLs (20 g total) by mouth daily as needed for mild constipation. 120 mL 0  . ondansetron (ZOFRAN-ODT) 8 MG disintegrating tablet Take 1 tablet (8 mg total) by mouth every 8 (eight) hours as needed for nausea. 30 tablet 0  . pantoprazole (PROTONIX) 20 MG tablet Take 1 tablet (20 mg total) by mouth daily. 90 tablet 1   No facility-administered medications prior to visit.      Per HPI unless specifically indicated in ROS section below Review of Systems     Objective:    BP 116/70 (BP Location: Left Arm, Patient Position: Sitting, Cuff Size: Normal)   Pulse 63   Temp 98.6 F (37 C) (Oral)   Ht 5' 1.5" (1.562 m)   Wt 164 lb 8  oz (74.6 kg)   SpO2 98%   BMI 30.58 kg/m   Wt Readings from Last 3 Encounters:  11/29/17 164 lb 8 oz (74.6 kg)  09/02/17 165 lb (74.8 kg)  07/21/17 174 lb (78.9 kg)    Physical Exam  Constitutional: She appears well-developed and well-nourished. No distress.  HENT:  Mouth/Throat: No oropharyngeal exudate.  mildy dry MM  Eyes: Pupils are equal, round, and reactive to light. Conjunctivae and EOM are normal. No scleral icterus.  Cardiovascular: Normal rate, regular rhythm, normal heart sounds and intact distal pulses.  No murmur heard. Pulmonary/Chest: Effort normal and breath sounds normal. No respiratory distress. She has no rales.  Abdominal: Soft.  Normal appearance and bowel sounds are normal. She exhibits no distension and no mass. There is no hepatosplenomegaly. There is tenderness in the right lower quadrant, epigastric area, suprapubic area and left lower quadrant. There is no rigidity, no rebound, no guarding, no CVA tenderness and negative Murphy's sign.  Musculoskeletal: She exhibits no edema.  Psychiatric: She has a normal mood and affect.  Nursing note and vitals reviewed.  Results for orders placed or performed in visit on 11/29/17  POC Influenza A&B(BINAX/QUICKVUE)  Result Value Ref Range   Influenza A, POC Negative Negative   Influenza B, POC Negative Negative      Assessment & Plan:   Problem List Items Addressed This Visit    Crohn's disease ([place])   Viral gastroenteritis - Primary    Anticipate viral stomach bug - treat with fluids and rest. Supportive care reviewed. zofran PRN nausea. Update if not improving with treatment. Pt agrees with plan.        Other Visit Diagnoses    Fever, unspecified fever cause       Relevant Orders   POC Influenza A&B(BINAX/QUICKVUE) (Completed)       Meds ordered this encounter  Medications  . ondansetron (ZOFRAN) 4 MG tablet    Sig: Take 1 tablet (4 mg total) by mouth every 8 (eight) hours as needed for nausea or vomiting.    Dispense:  20 tablet    Refill:  0   Orders Placed This Encounter  Procedures  . POC Influenza A&B(BINAX/QUICKVUE)    Follow up plan: Return if symptoms worsen or fail to improve, for annual exam, prior fasting for blood work.  Ria Bush, MD

## 2017-12-25 DIAGNOSIS — K5080 Crohn's disease of both small and large intestine without complications: Secondary | ICD-10-CM | POA: Diagnosis not present

## 2017-12-25 DIAGNOSIS — K50019 Crohn's disease of small intestine with unspecified complications: Secondary | ICD-10-CM | POA: Diagnosis not present

## 2017-12-26 ENCOUNTER — Other Ambulatory Visit: Payer: Self-pay | Admitting: Family Medicine

## 2017-12-26 DIAGNOSIS — Z1231 Encounter for screening mammogram for malignant neoplasm of breast: Secondary | ICD-10-CM

## 2017-12-31 ENCOUNTER — Other Ambulatory Visit: Payer: Self-pay | Admitting: Family Medicine

## 2017-12-31 DIAGNOSIS — E538 Deficiency of other specified B group vitamins: Secondary | ICD-10-CM

## 2017-12-31 DIAGNOSIS — R739 Hyperglycemia, unspecified: Secondary | ICD-10-CM

## 2017-12-31 DIAGNOSIS — D72819 Decreased white blood cell count, unspecified: Secondary | ICD-10-CM

## 2017-12-31 DIAGNOSIS — E785 Hyperlipidemia, unspecified: Secondary | ICD-10-CM

## 2018-01-05 ENCOUNTER — Other Ambulatory Visit (INDEPENDENT_AMBULATORY_CARE_PROVIDER_SITE_OTHER): Payer: 59

## 2018-01-05 DIAGNOSIS — D72819 Decreased white blood cell count, unspecified: Secondary | ICD-10-CM

## 2018-01-05 DIAGNOSIS — E785 Hyperlipidemia, unspecified: Secondary | ICD-10-CM | POA: Diagnosis not present

## 2018-01-05 DIAGNOSIS — R739 Hyperglycemia, unspecified: Secondary | ICD-10-CM | POA: Diagnosis not present

## 2018-01-05 DIAGNOSIS — E538 Deficiency of other specified B group vitamins: Secondary | ICD-10-CM | POA: Diagnosis not present

## 2018-01-05 LAB — COMPREHENSIVE METABOLIC PANEL
ALT: 20 U/L (ref 0–35)
AST: 30 U/L (ref 0–37)
Albumin: 3.6 g/dL (ref 3.5–5.2)
Alkaline Phosphatase: 103 U/L (ref 39–117)
BUN: 10 mg/dL (ref 6–23)
CALCIUM: 8.5 mg/dL (ref 8.4–10.5)
CHLORIDE: 108 meq/L (ref 96–112)
CO2: 28 meq/L (ref 19–32)
Creatinine, Ser: 0.76 mg/dL (ref 0.40–1.20)
GFR: 82.47 mL/min (ref >60.00)
Glucose, Bld: 98 mg/dL (ref 70–99)
Potassium: 3.9 mEq/L (ref 3.5–5.1)
Sodium: 141 mEq/L (ref 135–145)
Total Bilirubin: 0.3 mg/dL (ref 0.2–1.2)
Total Protein: 6.7 g/dL (ref 6.0–8.3)

## 2018-01-05 LAB — CBC WITH DIFFERENTIAL/PLATELET
BASOS ABS: 0.1 10*3/uL (ref 0.0–0.1)
Basophils Relative: 0.9 % (ref 0.0–3.0)
Eosinophils Absolute: 0.3 10*3/uL (ref 0.0–0.7)
Eosinophils Relative: 3.5 % (ref 0.0–5.0)
HCT: 37.2 % (ref 36.0–46.0)
Hemoglobin: 12.4 g/dL (ref 12.0–15.0)
LYMPHS ABS: 3.8 10*3/uL (ref 0.7–4.0)
Lymphocytes Relative: 41.8 % (ref 12.0–46.0)
MCHC: 33.4 g/dL (ref 30.0–36.0)
MCV: 91.4 fl (ref 78.0–100.0)
MONOS PCT: 5.9 % (ref 3.0–12.0)
Monocytes Absolute: 0.5 10*3/uL (ref 0.1–1.0)
NEUTROS PCT: 47.9 % (ref 43.0–77.0)
Neutro Abs: 4.4 10*3/uL (ref 1.4–7.7)
PLATELETS: 267 10*3/uL (ref 150.0–400.0)
RBC: 4.07 Mil/uL (ref 3.87–5.11)
RDW: 14.9 % (ref 11.5–15.5)
WBC: 9.1 10*3/uL (ref 4.0–10.5)

## 2018-01-05 LAB — LIPID PANEL
CHOL/HDL RATIO: 4
Cholesterol: 147 mg/dL (ref 0–200)
HDL: 40.9 mg/dL (ref >39.00)
LDL CALC: 83 mg/dL (ref 0–99)
NonHDL: 106.04
TRIGLYCERIDES: 115 mg/dL (ref 0.0–149.0)
VLDL: 23 mg/dL (ref 0.0–40.0)

## 2018-01-05 LAB — VITAMIN B12: Vitamin B-12: 371 pg/mL (ref 211–911)

## 2018-01-05 LAB — HEMOGLOBIN A1C: Hgb A1c MFr Bld: 5.6 % (ref 4.6–6.5)

## 2018-01-09 ENCOUNTER — Encounter: Payer: 59 | Admitting: Family Medicine

## 2018-01-12 ENCOUNTER — Other Ambulatory Visit: Payer: Self-pay | Admitting: Family Medicine

## 2018-01-12 ENCOUNTER — Ambulatory Visit: Payer: 59

## 2018-01-24 ENCOUNTER — Ambulatory Visit (INDEPENDENT_AMBULATORY_CARE_PROVIDER_SITE_OTHER): Payer: 59 | Admitting: Family Medicine

## 2018-01-24 ENCOUNTER — Encounter: Payer: Self-pay | Admitting: Family Medicine

## 2018-01-24 ENCOUNTER — Telehealth: Payer: Self-pay | Admitting: Family Medicine

## 2018-01-24 VITALS — BP 118/64 | HR 58 | Temp 97.9°F | Ht 61.0 in | Wt 166.2 lb

## 2018-01-24 DIAGNOSIS — E785 Hyperlipidemia, unspecified: Secondary | ICD-10-CM | POA: Diagnosis not present

## 2018-01-24 DIAGNOSIS — E538 Deficiency of other specified B group vitamins: Secondary | ICD-10-CM | POA: Diagnosis not present

## 2018-01-24 DIAGNOSIS — E669 Obesity, unspecified: Secondary | ICD-10-CM

## 2018-01-24 DIAGNOSIS — G459 Transient cerebral ischemic attack, unspecified: Secondary | ICD-10-CM

## 2018-01-24 DIAGNOSIS — F329 Major depressive disorder, single episode, unspecified: Secondary | ICD-10-CM | POA: Diagnosis not present

## 2018-01-24 DIAGNOSIS — K50919 Crohn's disease, unspecified, with unspecified complications: Secondary | ICD-10-CM

## 2018-01-24 DIAGNOSIS — F419 Anxiety disorder, unspecified: Secondary | ICD-10-CM

## 2018-01-24 DIAGNOSIS — Z0000 Encounter for general adult medical examination without abnormal findings: Secondary | ICD-10-CM

## 2018-01-24 DIAGNOSIS — I10 Essential (primary) hypertension: Secondary | ICD-10-CM | POA: Diagnosis not present

## 2018-01-24 DIAGNOSIS — Z01419 Encounter for gynecological examination (general) (routine) without abnormal findings: Secondary | ICD-10-CM | POA: Diagnosis not present

## 2018-01-24 DIAGNOSIS — F32A Depression, unspecified: Secondary | ICD-10-CM

## 2018-01-24 MED ORDER — NORTRIPTYLINE HCL 25 MG PO CAPS: 25.0000 mg | ORAL_CAPSULE | Freq: Every day | ORAL | 3 refills | Status: DC

## 2018-01-24 NOTE — Assessment & Plan Note (Signed)
Marked improvement attributed to healthy diet changes.  The 10-year ASCVD risk score Mikey Bussing DC Brooke Bonito., et al., 2013) is: 3.6%   Values used to calculate the score:     Age: 62 years     Sex: Female     Is Non-Hispanic African American: No     Diabetic: No     Tobacco smoker: No     Systolic Blood Pressure: 482 mmHg     Is BP treated: Yes     HDL Cholesterol: 40.9 mg/dL     Total Cholesterol: 147 mg/dL

## 2018-01-24 NOTE — Assessment & Plan Note (Signed)
Encouraged healthy diet and lifestyle changes to affect sustainable weight loss.  

## 2018-01-24 NOTE — Progress Notes (Signed)
BP 118/64 (BP Location: Left Arm, Patient Position: Sitting, Cuff Size: Normal)   Pulse (!) 58   Temp 97.9 F (36.6 C) (Oral)   Ht 5' 1"  (1.549 m)   Wt 166 lb 4 oz (75.4 kg)   SpO2 98%   BMI 31.41 kg/m    CC: CPE Subjective:    Patient ID: Sharon [name], female    DOB: [birth year], 62 y.o.   MRN: [id number]  HPI: Sharon [name] is a 62 y.o. female presenting on 01/24/2018 for Annual Exam   Having trouble sleeping despite amitriptyline 45m and benadryl nightly.  She also takes celexa 499mdaily for depression. Some trouble with attention. Quickly thinks about worst case scenario. Lorazepam initially effective then may have lost effect. Valium was not helpful.   ?TIA vs complicated migraine [id number] on daily aspirin since.   Crohn's - on entyvio. Takes b12 shots.   Requests letter to keep water nearby during work.   Preventative: Colon cancer screening - stable colonoscopy 10/2017 Lung cancer screening - not eligible Breast cancer screening - due for mammogram last 2014 [facility] - previously [facility] FeToney RakesWould [facility]  Flu shot - yearly Tdap 2013 prevnar 2015, pneumovax 2013  Shingrix - discussed Hep A/B - 2014, 2015, Hep B alone 05/2013 Seat belt use discussed Sunscreen use discussed. No changing moles on skin.  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  Interim medical history since our last visit reviewed. Allergies and medications reviewed and updated. Outpatient Medications Prior to Visit  Medication Sig Dispense Refill  . aspirin EC 81 MG tablet Take 1 tablet (81 mg total) by mouth daily. Over the counter 30 tablet 3  . citalopram (CELEXA) 40 MG tablet TAKE 1 TABLET BY MOUTH ONCE DAILY 30 tablet 0  . cyanocobalamin (,VITAMIN B-12,) 1000 MCG/ML injection Inject 1,000 mcg into the muscle every 30 (thirty) days.    . diphenhydrAMINE (BENADRYL) 25 mg capsule Take 12.5 mg by mouth at bedtime as needed for sleep.    . metoprolol tartrate (LOPRESSOR) 50 MG tablet TAKE ONE TABLET BY MOUTH TWICE DAILY 180 tablet 3  . vedolizumab (ENTYVIO) 300 MG injection Inject into the vein every 8 (eight) weeks.     . Marland Kitchenmitriptyline (ELAVIL) 50 MG tablet Take 1.5 tablets (75 mg total) by mouth at bedtime. 135 tablet 3  . citalopram (CELEXA) 40 MG tablet Take 1 tablet (40 mg total) by mouth daily. 30 tablet 6  . nitroGLYCERIN (NITROSTAT) 0.4 MG SL tablet Place 1 tablet (0.4 mg total) under the tongue every 5 (five) minutes as needed for chest pain. (Patient not taking: Reported on 12/27/2016) 30 tablet 12  . ondansetron (ZOFRAN) 4 MG tablet Take 1 tablet (4 mg total) by mouth every 8 (eight) hours as needed for nausea or vomiting. 20 tablet 0   No facility-administered medications prior to visit.  Per HPI unless specifically indicated in ROS section below Review of Systems  Constitutional: Negative for activity change, appetite change, chills, fatigue, fever and unexpected weight change.  HENT: Negative for hearing loss.   Eyes: Negative for visual disturbance.  Respiratory: Negative for cough, chest tightness, shortness of breath and wheezing.   Cardiovascular: Negative for chest pain, palpitations and leg swelling.  Gastrointestinal: Positive for diarrhea and nausea. Negative for abdominal distention, abdominal pain, blood in stool, constipation and  vomiting.       Intermittent  Endocrine: Positive for cold intolerance (a few days ago).  Genitourinary: Negative for difficulty urinating and hematuria.  Musculoskeletal: Negative for arthralgias, myalgias and neck pain.  Skin: Negative for rash.  Neurological: Positive for headaches. Negative for dizziness, seizures and syncope.  Hematological: Negative for adenopathy. Bruises/bleeds easily.  Psychiatric/Behavioral: Positive for dysphoric mood. The patient is nervous/anxious (>>).        Objective:    BP 118/64 (BP Location: Left Arm, Patient Position: Sitting, Cuff Size: Normal)   Pulse (!) 58   Temp 97.9 F (36.6 C) (Oral)   Ht 5' 1"  (1.549 m)   Wt 166 lb 4 oz (75.4 kg)   SpO2 98%   BMI 31.41 kg/m   Wt Readings from Last 3 Encounters:  01/24/18 166 lb 4 oz (75.4 kg)  11/29/17 164 lb 8 oz (74.6 kg)  09/02/17 165 lb (74.8 kg)    Physical Exam  Constitutional: She is oriented to person, place, and time. She appears well-developed and well-nourished. No distress.  HENT:  Head: Normocephalic and atraumatic.  Right Ear: Hearing, tympanic membrane, external ear and ear canal normal.  Left Ear: Hearing, tympanic membrane, external ear and ear canal normal.  Nose: Nose normal.  Mouth/Throat: Uvula is midline, oropharynx is clear and moist and mucous membranes are normal. No oropharyngeal exudate, posterior oropharyngeal edema or posterior oropharyngeal erythema.  Eyes: Pupils are equal, round, and reactive to light. Conjunctivae and EOM are normal. No scleral icterus.  Neck: Normal range of motion. Neck supple. No thyromegaly present.  Cardiovascular: Normal rate, regular rhythm, normal heart sounds and intact distal pulses.  No murmur heard. Pulses:      Radial pulses are 2+ on the right side, and 2+ on the left side.  Pulmonary/Chest: Effort normal and breath sounds normal. No respiratory distress. She has no wheezes. She has no rales.  Abdominal: Soft. Bowel sounds are  normal. She exhibits no distension and no mass. There is no tenderness. There is no rebound and no guarding.  Musculoskeletal: Normal range of motion. She exhibits no edema.  Lymphadenopathy:    She has no cervical adenopathy.  Neurological: She is alert and oriented to person, place, and time.  CN grossly intact, station and gait intact  Skin: Skin is warm and dry. No rash noted.  Psychiatric: She has a normal mood and affect. Her behavior is normal. Judgment and thought content normal.  Nursing note and vitals reviewed.  Results for orders placed or performed in visit on 01/05/18  Vitamin B12  Result Value Ref Range   Vitamin B-12 371 211 - 911 pg/mL  CBC with Differential/Platelet  Result Value Ref Range   WBC 9.1 4.0 - 10.5 K/uL   RBC 4.07 3.87 - 5.11 Mil/uL   Hemoglobin 12.4 12.0 - 15.0 g/dL   HCT 37.2 36.0 - 46.0 %   MCV 91.4 78.0 - 100.0 fl   MCHC 33.4 30.0 - 36.0 g/dL   RDW 14.9 11.5 -  15.5 %   Platelets 267.0 150.0 - 400.0 K/uL   Neutrophils Relative % 47.9 43.0 - 77.0 %   Lymphocytes Relative 41.8 12.0 - 46.0 %   Monocytes Relative 5.9 3.0 - 12.0 %   Eosinophils Relative 3.5 0.0 - 5.0 %   Basophils Relative 0.9 0.0 - 3.0 %   Neutro Abs 4.4 1.4 - 7.7 K/uL   Lymphs Abs 3.8 0.7 - 4.0 K/uL   Monocytes Absolute 0.5 0.1 - 1.0 K/uL   Eosinophils Absolute 0.3 0.0 - 0.7 K/uL   Basophils Absolute 0.1 0.0 - 0.1 K/uL  Hemoglobin A1c  Result Value Ref Range   Hgb A1c MFr Bld 5.6 4.6 - 6.5 %  Comprehensive metabolic panel  Result Value Ref Range   Sodium 141 135 - 145 mEq/L   Potassium 3.9 3.5 - 5.1 mEq/L   Chloride 108 96 - 112 mEq/L   CO2 28 19 - 32 mEq/L   Glucose, Bld 98 70 - 99 mg/dL   BUN 10 6 - 23 mg/dL   Creatinine, Ser 0.76 0.40 - 1.20 mg/dL   Total Bilirubin 0.3 0.2 - 1.2 mg/dL   Alkaline Phosphatase 103 39 - 117 U/L   AST 30 0 - 37 U/L   ALT 20 0 - 35 U/L   Total Protein 6.7 6.0 - 8.3 g/dL   Albumin 3.6 3.5 - 5.2 g/dL   Calcium 8.5 8.4 - 10.5 mg/dL   GFR  82.47 >60.00 mL/min  Lipid panel  Result Value Ref Range   Cholesterol 147 0 - 200 mg/dL   Triglycerides 115.0 0.0 - 149.0 mg/dL   HDL 40.90 >39.00 mg/dL   VLDL 23.0 0.0 - 40.0 mg/dL   LDL Cholesterol 83 0 - 99 mg/dL   Total CHOL/HDL Ratio 4    NonHDL 106.04    Depression screen [facility] 2/9 01/24/2018 01/24/2018 11/29/2017 11/09/2015  Decreased Interest 1 0 0 1  Down, Depressed, Hopeless 1 0 2 0  PHQ - 2 Score 2 0 2 1  Altered sleeping 3 - 3 3  Tired, decreased energy 3 - 3 3  Change in appetite 0 - 1 2  Feeling bad or failure about yourself  0 - 1 0  Trouble concentrating 3 - 3 2  Moving slowly or fidgety/restless 3 - 1 1  Suicidal thoughts 0 - 0 0  PHQ-9 Score 14 - 14 12  Difficult doing work/chores - - - Somewhat difficult   GAD 7 : Generalized Anxiety Score 01/24/2018 11/09/2015  Nervous, Anxious, on Edge 3 3  Control/stop worrying 3 1  Worry too much - different things 0 1  Trouble relaxing 0 1  Restless 2 1  Easily annoyed or irritable 2 3  Afraid - awful might happen 2 1  Total GAD 7 Score 12 11      Assessment & Plan:   Problem List Items Addressed This Visit    Anxiety and depression    Anxiety > depression.  Continue high dose celexa. Possible side effects to amitriptyline 62m nightly (dry mouth) - will transition to nortriptyline 25-572mnightly. RTC 1-2 mo f/u visit.       Relevant Medications   nortriptyline (PAMELOR) 25 MG capsule   [place]   Followed by GI. Appreciate their care. On entyvio Q8 wk IV.       Healthcare maintenance - Primary    Preventative protocols reviewed and updated unless pt declined. Discussed healthy diet and lifestyle.  HLD (hyperlipidemia)    Marked improvement attributed to healthy diet changes.  The 10-year ASCVD risk score Mikey Bussing DC Brooke Bonito., et al., 2013) is: 3.6%   Values used to calculate the score:     Age: 69 years     Sex: Female     Is Non-Hispanic African American: No     Diabetic: No     Tobacco  smoker: No     Systolic Blood Pressure: 680 mmHg     Is BP treated: Yes     HDL Cholesterol: 40.9 mg/dL     Total Cholesterol: 147 mg/dL       HTN (hypertension)    Chronic, stable. Continue metoprolol.      Obesity, Class I, BMI 30.0-34.9 (see actual BMI)    Encouraged healthy diet and lifestyle changes to affect sustainable weight loss.       TIA (transient ischemic attack)    ?h/o this. Continue aspirin 31m daily.      Vitamin B12 deficiency    Continue B12 shots.        Other Visit Diagnoses    Well woman exam       Relevant Orders   Ambulatory referral to Gynecology       Meds ordered this encounter  Medications  . nortriptyline (PAMELOR) 25 MG capsule    Sig: Take 1-2 capsules (25-50 mg total) by mouth at bedtime.    Dispense:  60 capsule    Refill:  3   Orders Placed This Encounter  Procedures  . Ambulatory referral to Gynecology    Referral Priority:   Routine    Referral Type:   Consultation    Referral Reason:   Specialty Services Required    Requested Specialty:   Gynecology    Number of Visits Requested:   1    Follow up plan: Return in about 6 weeks (around 03/07/2018) for follow up visit.  JRia Bush MD

## 2018-01-24 NOTE — Assessment & Plan Note (Signed)
Followed by GI. Appreciate their care. On entyvio Q8 wk IV.

## 2018-01-24 NOTE — Patient Instructions (Addendum)
Schedule eye and dental exam  If interested, check with pharmacy about new 2 shot shingles series (shingrix).  We will refer you to [place] from amitriptyline to nortriptyline to see if more effective - in same family. Update me with effect on mood and sleep.  Return as needed or in 1 year for next physical.  Return in 1-2 months for follow up visit on medicine change  Health Maintenance, Female Adopting a healthy lifestyle and getting preventive care can go a long way to promote health and wellness. Talk with your health care provider about what schedule of regular examinations is right for you. This is a good chance for you to check in with your provider about disease prevention and staying healthy. In between checkups, there are plenty of things you can do on your own. Experts have done a lot of research about which lifestyle changes and preventive measures are most likely to keep you healthy. Ask your health care provider for more information. Weight and diet Eat a healthy diet  Be sure to include plenty of vegetables, fruits, low-fat dairy products, and lean protein.  Do not eat a lot of foods high in solid fats, added sugars, or salt.  Get regular exercise. This is one of the most important things you can do for your health. ? Most adults should exercise for at least 150 minutes each week. The exercise should increase your heart rate and make you sweat (moderate-intensity exercise). ? Most adults should also do strengthening exercises at least twice a week. This is in addition to the moderate-intensity exercise.  Maintain a healthy weight  Body mass index (BMI) is a measurement that can be used to identify possible weight problems. It estimates body fat based on height and weight. Your health care provider can help determine your BMI and help you achieve or maintain a healthy weight.  For females 88 years of age and older: ? A BMI below 18.5 is considered underweight. ? A  BMI of 18.5 to 24.9 is normal. ? A BMI of 25 to 29.9 is considered overweight. ? A BMI of 30 and above is considered obese.  Watch levels of cholesterol and blood lipids  You should start having your blood tested for lipids and cholesterol at 60 years of age, then have this test every 5 years.  You may need to have your cholesterol levels checked more often if: ? Your lipid or cholesterol levels are high. ? You are older than 60 years of age. ? You are at high risk for heart disease.  Cancer screening Lung Cancer  Lung cancer screening is recommended for adults 60-64 years old who are at high risk for lung cancer because of a history of smoking.  A yearly low-dose CT scan of the lungs is recommended for people who: ? Currently smoke. ? Have quit within the past 15 years. ? Have at least a 30-pack-year history of smoking. A pack year is smoking an average of one pack of cigarettes a day for 1 year.  Yearly screening should continue until it has been 15 years since you quit.  Yearly screening should stop if you develop a health problem that would prevent you from having lung cancer treatment.  Breast Cancer  Practice breast self-awareness. This means understanding how your breasts normally appear and feel.  It also means doing regular breast self-exams. Let your health care provider know about any changes, no matter how small.  If you are in your 16s  or 53s, you should have a clinical breast exam (CBE) by a health care provider every 1-3 years as part of a regular health exam.  If you are 54 or older, have a CBE every year. Also consider having a breast X-ray (mammogram) every year.  If you have a family history of breast cancer, talk to your health care provider about genetic screening.  If you are at high risk for breast cancer, talk to your health care provider about having an MRI and a mammogram every year.  Breast cancer gene (BRCA) assessment is recommended for women who  have family members with BRCA-related cancers. BRCA-related cancers include: ? Breast. ? Ovarian. ? Tubal. ? Peritoneal cancers.  Results of the assessment will determine the need for genetic counseling and BRCA1 and BRCA2 testing.  Cervical Cancer Your health care provider may recommend that you be screened regularly for cancer of the pelvic organs (ovaries, uterus, and vagina). This screening involves a pelvic examination, including checking for microscopic changes to the surface of your cervix (Pap test). You may be encouraged to have this screening done every 3 years, beginning at age 57.  For women ages 6-65, health care providers may recommend pelvic exams and Pap testing every 3 years, or they may recommend the Pap and pelvic exam, combined with testing for human papilloma virus (HPV), every 5 years. Some types of HPV increase your risk of cervical cancer. Testing for HPV may also be done on women of any age with unclear Pap test results.  Other health care providers may not recommend any screening for nonpregnant women who are considered low risk for pelvic cancer and who do not have symptoms. Ask your health care provider if a screening pelvic exam is right for you.  If you have had past treatment for cervical cancer or a condition that could lead to cancer, you need Pap tests and screening for cancer for at least 20 years after your treatment. If Pap tests have been discontinued, your risk factors (such as having a new sexual partner) need to be reassessed to determine if screening should resume. Some women have medical problems that increase the chance of getting cervical cancer. In these cases, your health care provider may recommend more frequent screening and Pap tests.  Colorectal Cancer  This type of cancer can be detected and often prevented.  Routine colorectal cancer screening usually begins at 60 years of age and continues through 60 years of age.  Your health care  provider may recommend screening at an earlier age if you have risk factors for colon cancer.  Your health care provider may also recommend using home test kits to check for hidden blood in the stool.  A small camera at the end of a tube can be used to examine your colon directly (sigmoidoscopy or colonoscopy). This is done to check for the earliest forms of colorectal cancer.  Routine screening usually begins at age 45.  Direct examination of the colon should be repeated every 5-10 years through 60 years of age. However, you may need to be screened more often if early forms of precancerous polyps or small growths are found.  Skin Cancer  Check your skin from head to toe regularly.  Tell your health care provider about any new moles or changes in moles, especially if there is a change in a mole's shape or color.  Also tell your health care provider if you have a mole that is larger than the size of a  pencil eraser.  Always use sunscreen. Apply sunscreen liberally and repeatedly throughout the day.  Protect yourself by wearing long sleeves, pants, a wide-brimmed hat, and sunglasses whenever you are outside.  Heart disease, diabetes, and high blood pressure  High blood pressure causes heart disease and increases the risk of stroke. High blood pressure is more likely to develop in: ? People who have blood pressure in the high end of the normal range (130-139/85-89 mm Hg). ? People who are overweight or obese. ? People who are African American.  If you are 87-20 years of age, have your blood pressure checked every 3-5 years. If you are 58 years of age or older, have your blood pressure checked every year. You should have your blood pressure measured twice-once when you are at a hospital or clinic, and once when you are not at a hospital or clinic. Record the average of the two measurements. To check your blood pressure when you are not at a hospital or clinic, you can use: ? An automated  blood pressure machine at a pharmacy. ? A home blood pressure monitor.  If you are between 45 years and 33 years old, ask your health care provider if you should take aspirin to prevent strokes.  Have regular diabetes screenings. This involves taking a blood sample to check your fasting blood sugar level. ? If you are at a normal weight and have a low risk for diabetes, have this test once every three years after 60 years of age. ? If you are overweight and have a high risk for diabetes, consider being tested at a younger age or more often. Preventing infection Hepatitis B  If you have a higher risk for hepatitis B, you should be screened for this virus. You are considered at high risk for hepatitis B if: ? You were born in a country where hepatitis B is common. Ask your health care provider which countries are considered high risk. ? Your parents were born in a high-risk country, and you have not been immunized against hepatitis B (hepatitis B vaccine). ? You have HIV or AIDS. ? You use needles to inject street drugs. ? You live with someone who has hepatitis B. ? You have had sex with someone who has hepatitis B. ? You get hemodialysis treatment. ? You take certain medicines for conditions, including cancer, organ transplantation, and autoimmune conditions.  Hepatitis C  Blood testing is recommended for: ? Everyone born from [birth year] through 1963. ? Anyone with known risk factors for hepatitis C.  Sexually transmitted infections (STIs)  You should be screened for sexually transmitted infections (STIs) including gonorrhea and chlamydia if: ? You are sexually active and are younger than 60 years of age. ? You are older than 61 years of age and your health care provider tells you that you are at risk for this type of infection. ? Your sexual activity has changed since you were last screened and you are at an increased risk for chlamydia or gonorrhea. Ask your health care provider if you  are at risk.  If you do not have HIV, but are at risk, it may be recommended that you take a prescription medicine daily to prevent HIV infection. This is called pre-exposure prophylaxis (PrEP). You are considered at risk if: ? You are sexually active and do not regularly use condoms or know the HIV status of your partner(s). ? You take drugs by injection. ? You are sexually active with a partner who has HIV.  Talk with your health care provider about whether you are at high risk of being infected with HIV. If you choose to begin PrEP, you should first be tested for HIV. You should then be tested every 3 months for as long as you are taking PrEP. Pregnancy  If you are premenopausal and you may become pregnant, ask your health care provider about preconception counseling.  If you may become pregnant, take 400 to 800 micrograms (mcg) of folic acid every day.  If you want to prevent pregnancy, talk to your health care provider about birth control (contraception). Osteoporosis and menopause  Osteoporosis is a disease in which the bones lose minerals and strength with aging. This can result in serious bone fractures. Your risk for osteoporosis can be identified using a bone density scan.  If you are 26 years of age or older, or if you are at risk for osteoporosis and fractures, ask your health care provider if you should be screened.  Ask your health care provider whether you should take a calcium or vitamin D supplement to lower your risk for osteoporosis.  Menopause may have certain physical symptoms and risks.  Hormone replacement therapy may reduce some of these symptoms and risks. Talk to your health care provider about whether hormone replacement therapy is right for you. Follow these instructions at home:  Schedule regular health, dental, and eye exams.  Stay current with your immunizations.  Do not use any tobacco products including cigarettes, chewing tobacco, or electronic  cigarettes.  If you are pregnant, do not drink alcohol.  If you are breastfeeding, limit how much and how often you drink alcohol.  Limit alcohol intake to no more than 1 drink per day for nonpregnant women. One drink equals 12 ounces of beer, 5 ounces of wine, or 1 ounces of hard liquor.  Do not use street drugs.  Do not share needles.  Ask your health care provider for help if you need support or information about quitting drugs.  Tell your health care provider if you often feel depressed.  Tell your health care provider if you have ever been abused or do not feel safe at home. This information is not intended to replace advice given to you by your health care provider. Make sure you discuss any questions you have with your health care provider. Document Released: 02/28/2011 Document Revised: 01/21/2016 Document Reviewed: 05/19/2015 Elsevier Interactive Patient Education  [employer].

## 2018-01-24 NOTE — Assessment & Plan Note (Signed)
Anxiety > depression.  Continue high dose celexa. Possible side effects to amitriptyline 22m nightly (dry mouth) - will transition to nortriptyline 25-543mnightly. RTC 1-2 mo f/u visit.

## 2018-01-24 NOTE — Assessment & Plan Note (Signed)
?  h/o this. Continue aspirin 48m daily.

## 2018-01-24 NOTE — Assessment & Plan Note (Signed)
Continue B12 shots.

## 2018-01-24 NOTE — Assessment & Plan Note (Signed)
Preventative protocols reviewed and updated unless pt declined. Discussed healthy diet and lifestyle.  

## 2018-01-24 NOTE — Assessment & Plan Note (Signed)
Chronic, stable. Continue metoprolol.

## 2018-01-24 NOTE — Telephone Encounter (Signed)
Copied from [place] [id number]. Topic: Quick Communication - Rx Refill/Question >> Jan 24, 2018  7:15 PM Marin Olp L wrote: Medication: nortriptyline (PAMELOR) 25 MG capsule  Has the patient contacted their pharmacy? Yes.   (Agent: If no, request that the patient contact the pharmacy for the refill.) (Agent: If yes, when and what did the pharmacy advise?)  Preferred Pharmacy (with phone number or street name): [place], [place] - [address] [place] [address] Phone: [phone] Fax: [phone]  \Agent: Please be advised that RX refills may take up to 3 business days. We ask that you follow-up with your pharmacy.  Patient needs Dr. Darnell Level to call pharm because they won't fill it due to possible contraindication with another medication the patient takes.

## 2018-01-25 ENCOUNTER — Telehealth: Payer: Self-pay | Admitting: Family Medicine

## 2018-01-25 NOTE — Telephone Encounter (Signed)
Noted  

## 2018-01-25 NOTE — Telephone Encounter (Signed)
High dose celexa can cause heart arrhythmias, and combination with nortritpyline (new medicine I wanted to start) can also contribute to this.  Would she be willing to decrease celexa dose to 39m daily in order to try nortritpyline?

## 2018-01-25 NOTE — Telephone Encounter (Signed)
See other note

## 2018-01-25 NOTE — Telephone Encounter (Signed)
Left message for pt to call back.  Need to relay Dr. Synthia Innocent message and get answer to his question.

## 2018-01-25 NOTE — Telephone Encounter (Signed)
Copied from [place] [id number]. Topic: Quick Communication - See Telephone Encounter >> Jan 25, 2018  8:58 AM Cleaster Corin, NT wrote: CRM for notification. See Telephone encounter for: 01/25/18.  Pt. Stated that [facility] faxed over a paper for Dr. Darnell Level to sign due to the med. Ordered (nortriptyline (PAMELOR) 25 MG capsule [[id number]]) and what pt. Is currently taking.  Pt. Wanted to know the status of paper work  [facility] [address] [place] - [place] [address] Phone: [phone] Fax: [phone]

## 2018-01-25 NOTE — Telephone Encounter (Signed)
Patient says she is willing to try reduce Celexa to 61m. Please call her back if needed.

## 2018-01-25 NOTE — Telephone Encounter (Signed)
Placed paperwork in Dr. Synthia Innocent box.

## 2018-01-26 ENCOUNTER — Telehealth: Payer: Self-pay

## 2018-01-26 MED ORDER — LORAZEPAM 0.5 MG PO TABS: 0.5000 mg | ORAL_TABLET | Freq: Every evening | ORAL | 0 refills | Status: DC | PRN

## 2018-01-26 MED ORDER — CITALOPRAM HYDROBROMIDE 40 MG PO TABS: 40.0000 mg | ORAL_TABLET | Freq: Every day | ORAL | 1 refills | Status: DC

## 2018-01-26 NOTE — Telephone Encounter (Signed)
Left message for pt to call back.  Need to relay Dr. Synthia Innocent message.

## 2018-01-26 NOTE — Telephone Encounter (Signed)
I can't write letter for 3rd shift. We gave her a letter for this at her office visit. I have reprinted it for her and placed in Lisa's box.

## 2018-01-26 NOTE — Telephone Encounter (Signed)
Spoke with pt relaying Dr. Synthia Innocent message. Pt verbalizes understanding and requests the letter be mailed to her.  Mailed letter.

## 2018-01-26 NOTE — Telephone Encounter (Signed)
Thank you :)

## 2018-01-26 NOTE — Telephone Encounter (Signed)
Actually, may retrial lorazepam. Sent to pharmacy. Use sparingly for sleep. Ensure good bedtime routine.  May continue celexa 25m daily.

## 2018-01-26 NOTE — Telephone Encounter (Signed)
Copied from [place] [id number]. Topic: Inquiry >> Jan 26, 2018  1:15 PM Sharon [name] wrote: Reason for CRM: pt called and asked if her pcp can write notes for the pt to have a sleep pattern so that she cant work 3rd shift, and then she needs another note stating that she can have water w/ her at her other job, contact to advise pt will check mychart for messages as well

## 2018-01-26 NOTE — Telephone Encounter (Addendum)
Sharon [name] with [facility] has pt on phone about nortriptyline rx. I spoke with pt and advised per phone note that Dr Darnell Level decided not to send in the nortriptyline and wanted pt to retry the lorazepam which Dr Darnell Level sent to [address]. Pt is to use sparingly for sleep and to have a good bedtime routine and to continue Celexa 40 mg daily. Pt voiced understanding and will cb if has any further problem with sleep or questions. Fyi to Dr Darnell Level. I spoke with Dr Darnell Level and he did send in nortriptyline rx to [address] but then decided to change  rx to lorazepam and took off nortriptyline from current med list. I called [facility] [address] and spoke with [place] pharmacist and she still had rx on hold for pt. I asked Kerstin to d/c nortriptline rx. Kerstin voiced understanding so there would not be any confusion over the nortriptyline rx.

## 2018-01-30 DIAGNOSIS — Z1231 Encounter for screening mammogram for malignant neoplasm of breast: Secondary | ICD-10-CM | POA: Diagnosis not present

## 2018-01-30 LAB — HM MAMMOGRAPHY

## 2018-02-05 ENCOUNTER — Telehealth: Payer: Self-pay | Admitting: Obstetrics & Gynecology

## 2018-02-05 NOTE — Telephone Encounter (Signed)
[facility] referring for well women's exam. Called and left voicemail for patient to call back to be schedule

## 2018-02-12 ENCOUNTER — Ambulatory Visit (INDEPENDENT_AMBULATORY_CARE_PROVIDER_SITE_OTHER): Payer: 59 | Admitting: Obstetrics and Gynecology

## 2018-02-12 ENCOUNTER — Encounter: Payer: Self-pay | Admitting: Obstetrics and Gynecology

## 2018-02-12 VITALS — BP 130/82 | HR 54 | Ht 61.0 in | Wt 158.0 lb

## 2018-02-12 DIAGNOSIS — Z13820 Encounter for screening for osteoporosis: Secondary | ICD-10-CM | POA: Diagnosis not present

## 2018-02-12 DIAGNOSIS — Z01419 Encounter for gynecological examination (general) (routine) without abnormal findings: Secondary | ICD-10-CM

## 2018-02-12 DIAGNOSIS — Z0000 Encounter for general adult medical examination without abnormal findings: Secondary | ICD-10-CM | POA: Diagnosis not present

## 2018-02-12 DIAGNOSIS — Z8541 Personal history of malignant neoplasm of cervix uteri: Secondary | ICD-10-CM

## 2018-02-12 NOTE — Progress Notes (Signed)
Gynecology Annual Exam  PCP: Ria Bush, MD  Chief Complaint:  Chief Complaint  Patient presents with  . Gynecologic Exam    well woman exam    History of Present Illness:Patient is a 62 y.o. Sharon [name] presents for annual exam. The patient has no complaints today.   LMP: No LMP recorded. Patient has had a hysterectomy. She reports that this was performed for CIN 3 changes but the final pathology showed cervical cancer. She has not had a vaginal exam in at least 5 years. Hysterectomy performed by Dr. Uvaldo Rising at [facility] in [place].  The patient is not currently sexually active.  The patient does perform self breast exams.  There is notable family history of breast or ovarian cancer in her family. Her sister had ovarian cancer at 42 yo.   The patient wears seatbelts: yes.   The patient has regular exercise: no.  She has an active job.  The patient denies current symptoms of depression.     Review of Systems: Review of Systems  Constitutional: Negative for chills, fever, malaise/fatigue and weight loss.       Hot flashes  HENT: Negative for congestion, hearing loss and sinus pain.   Eyes: Negative for blurred vision and double vision.  Respiratory: Negative for cough, sputum production, shortness of breath and wheezing.   Cardiovascular: Negative for chest pain, palpitations, orthopnea and leg swelling.  Gastrointestinal: Negative for abdominal pain, constipation, diarrhea, nausea and vomiting.  Genitourinary: Negative for dysuria, flank pain, frequency, hematuria and urgency.  Musculoskeletal: Negative for back pain, falls and joint pain.  Skin: Negative for itching and rash.  Neurological: Negative for dizziness and headaches.  Psychiatric/Behavioral: Negative for depression, substance abuse and suicidal ideas. The patient is not nervous/anxious.     Past Medical History:  Past Medical History:  Diagnosis Date  . Anxiety and depression   .  Arthritis   . B12 deficiency    pernicious anemia - monthly b12 shots  . Cervical cancer ([place]) 12/2008   s/p hysterectomy  . Crohn's disease ([place]) 2000   h/o stenotic crohn's ileitis, active in TI s/p resections 2000, 2016 PPD neg ([facility] Dr. [name]); has been on cimzia, remicade, 6MP, entocort, now stable on Entyvio ([facility] [place])  . Genital warts   . GERD (gastroesophageal reflux disease)    severe, daily sxs if off PPI  . Hepatic steatosis 04/2011   diffuse on CT, 53m gallbladder polyp  . History of anemia    attributed to crohn's  . History of chicken pox   . History of syphilis 1980s  . HTN (hypertension)   . Migraines    and frequent other headaches (sinus or stress)  . Scalp psoriasis   . Sensorineural hearing loss of both ears 12/2012   high freq  . Takotsubo syndrome 07/2012   cath 08/01/12, normal coronaries, LVEF 65%  . TIA (transient ischemic attack) [facility]2010   [facility] vs complex migraine (w/u negative - carotids, echo, TC doppler, and MRI normal)  . Tobacco abuse     Past Surgical History:  Past Surgical History:  Procedure Laterality Date  . abd [place] 04/2011   diffuse hepatic steatosis, 579mpolyp in gallbaldder fundus, no stones, mod abd ath  . APPENDECTOMY  1998  . CARDIAC CATHETERIZATION  07/2012   normal LV fxn, widely patent coronaries  . CERVICAL BIOPSY  W/ LOOP ELECTRODE EXCISION  10/2008  . COLON RESECTION  10/2014  removal of scar tissue colon from prior surgery ([facility])  . COLONOSCOPY  10/2008   ileo-colonic anastomosis, ielocolonic crohn's  . COLONOSCOPY  05/2011   ileo-colonic anastomosis normal, normal mucosa  . COLONOSCOPY  10/15/2012   focal inflammation at anastomosis, no active crohn's, planning MR enterograph [name])  . COLONOSCOPY  09/2014   [facility]  . ESOPHAGOGASTRODUODENOSCOPY  05/2011   nl esophagus, gastritis, nl duodenum  . hospitalization  05/2010   TIA vs complex migraine - w/u negative - head CT, MRI/MRA,  carotid dopplers, echo.  treated with ASA and tramadol  . KNEE SURGERY Left   . LEFT HEART CATHETERIZATION WITH CORONARY ANGIOGRAM N/A 08/01/2012   Procedure: LEFT HEART CATHETERIZATION WITH CORONARY ANGIOGRAM;  Surgeon: Sherren Mocha, MD;  Location: [facility] C[facility] LAB;  Service: Cardiovascular;  Laterality: N/A;  . RIGHT COLECTOMY  2000   crohn's disease, ileocecal resection  . SHOULDER ARTHROSCOPY W/ ROTATOR CUFF REPAIR Right 05/31/2012   [facility] Tamera Punt)  . US ECHOCARDIOGRAPHY  05/2010   nl LV fxn ,EF 60%  . VAGINAL HYSTERECTOMY  12/2008   [facility]    Gynecologic History:  No LMP recorded. Patient has had a hysterectomy. Last Pap: Results were: uncertain, She reports that she had a hysterectomy after a LEEP showed CIN 3. She said that the pathology from her hysterectomy showed cervical cancer but she did not have any additional treatments.  Last mammogram: 2019 Results were: BI-RAD II  Obstetric History: [id number]  Family History:  Family History  Problem Relation Age of Onset  . Crohn's disease Daughter        crohn's, ulcerative colitis  . Ulcerative colitis Daughter   . Stroke Mother   . Hypertension Mother   . Hyperlipidemia Mother   . Hypertension Father   . Crohn's disease Father   . Crohn's disease Sister   . Cancer Sister        ovarian  . Crohn's disease Paternal Aunt   . Crohn's disease Paternal Uncle   . Diabetes Neg Hx     Social History:  Social History   Socioeconomic History  . Marital status: Divorced    Spouse name: Not on file  . Number of children: Not on file  . Years of education: Not on file  . Highest education level: Not on file  Occupational History  . Not on file  Social Needs  . Financial resource strain: Not on file  . Food insecurity:    Worry: Not on file    Inability: Not on file  . Transportation needs:    Medical: Not on file    Non-medical: Not on file  Tobacco Use  . Smoking status: Former Smoker    Packs/day: 0.33     Years: 10.00    Pack years: 3.30    Types: Cigarettes    Last attempt to quit: 11/14/2016    Years since quitting: 1.2  . Smokeless tobacco: Never Used  . Tobacco comment: Quit within past yr  Substance and Sexual Activity  . Alcohol use: Yes    Comment: occ  . Drug use: No    Comment: extensive drug use, remotely  . Sexual activity: Not Currently    Birth control/protection: Surgical, Post-menopausal  Lifestyle  . Physical activity:    Days per week: 5 days    Minutes per session: Not on file  . Stress: Only a little  Relationships  . Social connections:    Talks on phone: Not on file  Gets together: Not on file    Attends religious service: Not on file    Active member of club or organization: Not on file    Attends meetings of clubs or organizations: Not on file    Relationship status: Not on file  . Intimate partner violence:    Fear of current or ex partner: Not on file    Emotionally abused: Not on file    Physically abused: Not on file    Forced sexual activity: Not on file  Other Topics Concern  . 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   Edu: went back to school for psychology, considering CMA school   Activity: no regular activity   Diet:     Allergies:  Allergies  Allergen Reactions  . Infliximab Dermatitis  . Codeine Nausea And Vomiting  . Flagyl [Metronidazole] Nausea And Vomiting  . Hydrocodone Nausea And Vomiting  . Ibuprofen Other (See Comments)    Pt states that she is unable to take due to her Crohn's disease.  Marland Kitchen Remeron [Mirtazapine] Other (See Comments)    Reaction:  Makes pt lethargic     Medications: Prior to Admission medications   Medication Sig Start Date End Date Taking? Authorizing Provider  citalopram (CELEXA) 40 MG tablet Take 1  tablet (40 mg total) by mouth daily. 01/26/18  Yes Ria Bush, MD  cyanocobalamin (,VITAMIN B-12,) 1000 MCG/ML injection Inject 1,000 mcg into the muscle every 30 (thirty) days.   Yes [provider]  diphenhydrAMINE (BENADRYL) 25 mg capsule Take 12.5 mg by mouth at bedtime as needed for sleep.   Yes [provider]  LORazepam (ATIVAN) 0.5 MG tablet Take 1 tablet (0.5 mg total) by mouth at bedtime as needed for sleep. 01/26/18  Yes Ria Bush, MD  metoprolol tartrate (LOPRESSOR) 50 MG tablet TAKE ONE TABLET BY MOUTH TWICE DAILY 06/13/17  Yes Ria Bush, MD  vedolizumab (ENTYVIO) 300 MG injection Inject into the vein every 8 (eight) weeks.    Yes [provider]  aspirin EC 81 MG tablet Take 1 tablet (81 mg total) by mouth daily. Over the counter Patient not taking: Reported on 02/12/2018 05/01/16   Mendel Corning, MD    Physical Exam Vitals: Blood pressure 130/82, pulse (!) 54, height 5' 1"  (1.549 m), weight 158 lb (71.7 kg).  General: NAD HEENT: normocephalic, anicteric Thyroid: no enlargement, no palpable nodules Pulmonary: No increased work of breathing, CTAB Cardiovascular: RRR, distal pulses 2+ Breast: Breast symmetrical, no tenderness, no palpable nodules or masses, no skin or nipple retraction present, no nipple discharge.  No axillary or supraclavicular lymphadenopathy. Abdomen: NABS, soft, non-tender, non-distended.  Umbilicus without lesions.  No hepatomegaly, splenomegaly or masses palpable. No evidence of hernia  Genitourinary:  External: Normal external female genitalia.  Normal urethral meatus, normal Bartholin's and Skene's glands.    Vagina: Normal vaginal mucosa, no evidence of prolapse.  Normal vaginal cuff. No granulation tissue or lesions identified.   Adnexa: ovaries non-enlarged, no adnexal masses  Rectal: deferred  Lymphatic: no evidence of inguinal lymphadenopathy Extremities: no edema, erythema, or tenderness Neurologic:  Grossly intact Psychiatric: mood appropriate, affect full  Female chaperone present for pelvic and breast  portions of the physical exam    Assessment: 62 y.o. [id number] routine annual exam  Plan: Problem List Items Addressed This Visit    None    Visit Diagnoses    Hx of cervical cancer    -  Primary   Relevant Orders   PapIG, HPV, rfx 16/18      1) Mammogram - recommend yearly screening mammogram.  Mammogram Is up to date  2) STI screening  was not offered and therefore not obtained  3) ASCCP guidelines and rational discussed.  Patient opts for yearly screening interval. Asked patient to sign records release for hysterectomy records.   4) Osteoporosis  - per USPTF routine screening DEXA at age 46 - FRAX 58 year major fracture risk 70,  10 year hip fracture risk 1.9  Consider FDA-approved medical therapies in postmenopausal women and men aged 74 years and older, based on the following: a) A hip or vertebral (clinical or morphometric) fracture b) T-score ? -2.5 at the femoral neck or spine after appropriate evaluation to exclude secondary causes C) Low bone mass (T-score between -1.0 and -2.5 at the femoral neck or spine) and a 10-year probability of a hip fracture ? 3% or a 10-year probability of a major osteoporosis-related fracture ? 20% based on the [place]-adapted WHO algorithm   5) Routine healthcare maintenance including cholesterol, diabetes screening discussed managed by PCP  6) Colonoscopy is up to date, follows with her GI from [facility].  Screening recommended starting at age 24 for average risk individuals, age 72 for individuals deemed at increased risk (including African Americans) and recommended to continue until age 49.  For patient age 80-85 individualized approach is recommended.  Gold standard screening is via colonoscopy, Cologuard screening is an acceptable alternative for patient unwilling or unable to undergo colonoscopy.  "Colorectal cancer screening for  average?risk adults: 2018 guideline update from the American Cancer Society"CA: A Cancer Journal for Clinicians: Jan 25, 2017   7) Return in about 1 year (around 02/13/2019) for annual.   Adrian Prows MD [place] 02/12/18 10:50 AM

## 2018-02-13 ENCOUNTER — Encounter: Payer: Self-pay | Admitting: Family Medicine

## 2018-02-15 LAB — PAPIG, HPV, RFX 16/18
HPV, HIGH-RISK: NEGATIVE
PAP SMEAR COMMENT: 0

## 2018-02-18 MED ORDER — ESCITALOPRAM OXALATE 20 MG PO TABS: 20.0000 mg | ORAL_TABLET | Freq: Every day | ORAL | 6 refills | Status: DC

## 2018-02-18 NOTE — Addendum Note (Signed)
Addended by: Ria Bush on: 02/18/2018 01:22 AM   Modules accepted: Orders

## 2018-02-19 ENCOUNTER — Encounter: Payer: Self-pay | Admitting: Family Medicine

## 2018-02-19 ENCOUNTER — Telehealth: Payer: Self-pay

## 2018-02-19 DIAGNOSIS — K50019 Crohn's disease of small intestine with unspecified complications: Secondary | ICD-10-CM | POA: Diagnosis not present

## 2018-02-19 DIAGNOSIS — K5010 Crohn's disease of large intestine without complications: Secondary | ICD-10-CM | POA: Diagnosis not present

## 2018-02-19 NOTE — Telephone Encounter (Signed)
Spoke with pt notifying her the mammogram was normal and will need to repeat in 1 yr.  Pt verbalizes understanding.

## 2018-02-22 ENCOUNTER — Other Ambulatory Visit: Payer: Self-pay

## 2018-02-22 NOTE — Telephone Encounter (Addendum)
Received email from pt requesting refill for lorazepam. [See Pt email, 02/13/18.]  Last rx:  01/26/18, #30 Last OV (CPE):  01/24/18 Next OV:  03/27/18

## 2018-02-23 MED ORDER — LORAZEPAM 0.5 MG PO TABS: 0.5000 mg | ORAL_TABLET | Freq: Every evening | ORAL | 0 refills | Status: DC | PRN

## 2018-02-23 NOTE — Telephone Encounter (Signed)
Eprescribed.

## 2018-02-26 ENCOUNTER — Ambulatory Visit: Payer: 59 | Admitting: Family Medicine

## 2018-03-12 ENCOUNTER — Telehealth: Payer: Self-pay

## 2018-03-12 NOTE — Telephone Encounter (Signed)
Copied from [place] [phone]. Topic: Appointment Scheduling - Scheduling Inquiry for Clinic >> Mar 12, 2018  3:00 PM Burchel, Abbi R wrote: Reason for CRM: Pt is requesting an appt for TB testing.  Please call pt to schedule.  [phone]  >> Mar 12, 2018  3:24 PM Helene Shoe, LPN wrote: Unable to reach pt by phone.

## 2018-03-13 NOTE — Telephone Encounter (Signed)
Spoke with pt about TB skin test.  Says she has NV on 02/28/18 at 8:30 AM.

## 2018-03-15 ENCOUNTER — Ambulatory Visit: Payer: 59

## 2018-03-17 ENCOUNTER — Other Ambulatory Visit: Payer: Self-pay

## 2018-03-17 ENCOUNTER — Emergency Department
Admission: EM | Admit: 2018-03-17 | Discharge: 2018-03-17 | Disposition: A | Discharge status: Home / Self Care | Payer: 59 | Attending: Emergency Medicine | Admitting: Emergency Medicine

## 2018-03-17 ENCOUNTER — Emergency Department: Payer: 59

## 2018-03-17 DIAGNOSIS — R112 Nausea with vomiting, unspecified: Secondary | ICD-10-CM | POA: Diagnosis present

## 2018-03-17 DIAGNOSIS — Z87891 Personal history of nicotine dependence: Secondary | ICD-10-CM | POA: Diagnosis not present

## 2018-03-17 DIAGNOSIS — I10 Essential (primary) hypertension: Secondary | ICD-10-CM | POA: Diagnosis not present

## 2018-03-17 DIAGNOSIS — Z7982 Long term (current) use of aspirin: Secondary | ICD-10-CM | POA: Diagnosis not present

## 2018-03-17 DIAGNOSIS — R001 Bradycardia, unspecified: Secondary | ICD-10-CM | POA: Diagnosis not present

## 2018-03-17 DIAGNOSIS — G4700 Insomnia, unspecified: Secondary | ICD-10-CM

## 2018-03-17 DIAGNOSIS — Z8673 Personal history of transient ischemic attack (TIA), and cerebral infarction without residual deficits: Secondary | ICD-10-CM | POA: Diagnosis not present

## 2018-03-17 DIAGNOSIS — R197 Diarrhea, unspecified: Secondary | ICD-10-CM | POA: Diagnosis not present

## 2018-03-17 DIAGNOSIS — Z8541 Personal history of malignant neoplasm of cervix uteri: Secondary | ICD-10-CM | POA: Insufficient documentation

## 2018-03-17 DIAGNOSIS — R079 Chest pain, unspecified: Secondary | ICD-10-CM | POA: Diagnosis not present

## 2018-03-17 DIAGNOSIS — R1013 Epigastric pain: Secondary | ICD-10-CM | POA: Diagnosis not present

## 2018-03-17 DIAGNOSIS — Z79899 Other long term (current) drug therapy: Secondary | ICD-10-CM | POA: Diagnosis not present

## 2018-03-17 LAB — URINE DRUG SCREEN, QUALITATIVE (ARMC ONLY)
Amphetamines, Ur Screen: NOT DETECTED
BENZODIAZEPINE, UR SCRN: NOT DETECTED
CANNABINOID 50 NG, UR ~~LOC~~: NOT DETECTED
Cocaine Metabolite,Ur ~~LOC~~: NOT DETECTED
MDMA (Ecstasy)Ur Screen: NOT DETECTED
Methadone Scn, Ur: NOT DETECTED
OPIATE, UR SCREEN: NOT DETECTED
PHENCYCLIDINE (PCP) UR S: NOT DETECTED
Tricyclic, Ur Screen: NOT DETECTED

## 2018-03-17 LAB — TROPONIN I: Troponin I: <0.03 ng/mL (ref <0.03)

## 2018-03-17 LAB — URINALYSIS, COMPLETE (UACMP) WITH MICROSCOPIC
Bacteria, UA: NONE SEEN
Bilirubin Urine: NEGATIVE
Glucose, UA: NEGATIVE mg/dL
Ketones, ur: NEGATIVE mg/dL
Nitrite: NEGATIVE
PH: 6 (ref 5.0–8.0)
Protein, ur: NEGATIVE mg/dL
SPECIFIC GRAVITY, URINE: 1.006 (ref 1.005–1.030)
WBC, UA: >50 WBC/hpf — ABNORMAL HIGH (ref 0–5)

## 2018-03-17 LAB — BASIC METABOLIC PANEL
Anion gap: 10 (ref 5–15)
BUN: 12 mg/dL (ref 6–20)
CHLORIDE: 108 mmol/L (ref 98–111)
CO2: 22 mmol/L (ref 22–32)
Calcium: 8.7 mg/dL — ABNORMAL LOW (ref 8.9–10.3)
Creatinine, Ser: 0.71 mg/dL (ref 0.44–1.00)
GFR calc non Af Amer: >60 mL/min (ref >60)
Glucose, Bld: 88 mg/dL (ref 70–99)
POTASSIUM: 3.5 mmol/L (ref 3.5–5.1)
SODIUM: 140 mmol/L (ref 135–145)

## 2018-03-17 LAB — CBC
HEMATOCRIT: 36.6 % (ref 35.0–47.0)
HEMOGLOBIN: 12.5 g/dL (ref 12.0–16.0)
MCH: 31.1 pg (ref 26.0–34.0)
MCHC: 34.1 g/dL (ref 32.0–36.0)
MCV: 91.4 fL (ref 80.0–100.0)
Platelets: 242 10*3/uL (ref 150–440)
RBC: 4.01 MIL/uL (ref 3.80–5.20)
RDW: 14.4 % (ref 11.5–14.5)
WBC: 9.7 10*3/uL (ref 3.6–11.0)

## 2018-03-17 LAB — SALICYLATE LEVEL

## 2018-03-17 LAB — HEPATIC FUNCTION PANEL
ALBUMIN: 3.5 g/dL (ref 3.5–5.0)
ALT: 17 U/L (ref 0–44)
AST: 34 U/L (ref 15–41)
Alkaline Phosphatase: 94 U/L (ref 38–126)
BILIRUBIN DIRECT: 0.1 mg/dL (ref 0.0–0.2)
BILIRUBIN TOTAL: 0.4 mg/dL (ref 0.3–1.2)
Indirect Bilirubin: 0.3 mg/dL (ref 0.3–0.9)
Total Protein: 6.9 g/dL (ref 6.5–8.1)

## 2018-03-17 LAB — LIPASE, BLOOD: LIPASE: 49 U/L (ref 11–51)

## 2018-03-17 LAB — ACETAMINOPHEN LEVEL

## 2018-03-17 MED ORDER — SODIUM CHLORIDE 0.9 % IV BOLUS
1000.0000 mL | Freq: Once | INTRAVENOUS | Status: AC
  Administered 2018-03-17: 1000 mL via INTRAVENOUS

## 2018-03-17 MED ORDER — ONDANSETRON HCL 4 MG PO TABS: 4.0000 mg | ORAL_TABLET | Freq: Three times a day (TID) | ORAL | 0 refills | Status: DC | PRN

## 2018-03-17 NOTE — Discharge Instructions (Signed)
Return to the emergency room for any new or worsening symptoms including bleeding, persistent vomiting, or other concerns.  Including weight loss or fever.  Follow closely with primary care doctor.  For your insomnia is concerned, we have given you resources to take care of that as an outpatient.  If you have any thoughts of hurting yourself or anyone else return to the emergency department.  Follow closely with [facility] and your primary care doctor for these issues.

## 2018-03-17 NOTE — ED Notes (Signed)
PT was able to eat and drink with no nausea/vomitting

## 2018-03-17 NOTE — ED Notes (Signed)
PT given food and water for po challenge

## 2018-03-17 NOTE — ED Triage Notes (Signed)
Pt reports not feeling well for 4 days, states n/v/d with abd pain and chest pain, pt reports hx of chron's but states that this doesn't feel the same

## 2018-03-17 NOTE — ED Notes (Signed)
Patient transported to X-ray 
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
## 2018-03-17 NOTE — ED Notes (Signed)
PT provided resources by TTS

## 2018-03-17 NOTE — Progress Notes (Signed)
Provided [facility] mental health referral information to pt and explained how several of the agencies have walk-in clinic information. Encouraged her to call the agencies and ask any specific questions about their services, if she has them. Pt expressed an understanding and stated she had no questions.

## 2018-03-20 ENCOUNTER — Encounter: Payer: Self-pay | Admitting: Family Medicine

## 2018-03-21 ENCOUNTER — Ambulatory Visit: Payer: 59

## 2018-03-21 NOTE — Telephone Encounter (Addendum)
Patient is requesting a refill on her Lorazepam. Note: patient's my chart account is inactive, we will only be able to respond to her via phone.    Last Ov: 01/24/18 Annual  Next OV: 03/27/18  Last Refill: #30 on 02/23/18.    Dr. Darnell Level is out of the office, will forward to Dr. Damita Dunnings covering for response.

## 2018-03-22 MED ORDER — LORAZEPAM 0.5 MG PO TABS: 0.5000 mg | ORAL_TABLET | Freq: Every evening | ORAL | 0 refills | Status: DC | PRN

## 2018-03-22 NOTE — Telephone Encounter (Signed)
Noted  

## 2018-03-22 NOTE — Telephone Encounter (Signed)
Sent. Thanks.   

## 2018-03-27 ENCOUNTER — Encounter: Payer: Self-pay | Admitting: Family Medicine

## 2018-03-27 ENCOUNTER — Ambulatory Visit: Payer: 59 | Admitting: Family Medicine

## 2018-03-27 VITALS — BP 118/70 | HR 56 | Temp 98.4°F | Ht 61.0 in | Wt 158.8 lb

## 2018-03-27 DIAGNOSIS — F419 Anxiety disorder, unspecified: Secondary | ICD-10-CM

## 2018-03-27 DIAGNOSIS — F329 Major depressive disorder, single episode, unspecified: Secondary | ICD-10-CM

## 2018-03-27 DIAGNOSIS — F5104 Psychophysiologic insomnia: Secondary | ICD-10-CM

## 2018-03-27 DIAGNOSIS — F32A Depression, unspecified: Secondary | ICD-10-CM

## 2018-03-27 DIAGNOSIS — Z111 Encounter for screening for respiratory tuberculosis: Secondary | ICD-10-CM | POA: Diagnosis not present

## 2018-03-27 MED ORDER — TRAZODONE HCL 50 MG PO TABS: 25.0000 mg | ORAL_TABLET | Freq: Every evening | ORAL | 3 refills | Status: DC | PRN

## 2018-03-27 NOTE — Assessment & Plan Note (Addendum)
Pt overall feels well. Continue lexapro.  PHQ9 and GAD7 with improved readings today.

## 2018-03-27 NOTE — Assessment & Plan Note (Addendum)
Still struggles with insomnia and 2nd shift. Lorazepam helps with anxiety but unsure how effective it is to help with her sleep. Will trial trazodone 25-39m nightly. Amitriptyline lost effect and caused dry mouth. Reviewed bedtime routine/sleep hygiene.

## 2018-03-27 NOTE — Patient Instructions (Addendum)
Continue lexapro. Ok to continue lorazepam for now. I do want to trial trazodone 25-57m at bedtime for sleep.   Sleep hygiene checklist: 1. Avoid naps during the day 2. Avoid stimulants such as caffeine and nicotine. Avoid bedtime alcohol (it can speed onset of sleep but the body's metabolism can cause awakenings). 3. All forms of exercise help ensure sound sleep - limit vigorous exercise to morning or late afternoon 4. Avoid food too close to bedtime including chocolate (which contains caffeine) 5. Soak up natural light 6. Establish regular bedtime routine. 7. Associate bed with sleep - avoid TV, computer or phone, reading while in bed. 8. Ensure pleasant, relaxing sleep environment - quiet, dark, cool room.

## 2018-03-27 NOTE — Progress Notes (Signed)
BP 118/70 (BP Location: Right Arm, Patient Position: Sitting, Cuff Size: Normal)   Pulse (!) 56   Temp 98.4 F (36.9 C) (Oral)   Ht 5' 1"  (1.549 m)   Wt 158 lb 12 oz (72 kg)   SpO2 97%   BMI 30.00 kg/m    CC: 2 mo f/u visit Subjective:    Patient ID: Sharon [name], female    DOB: Jan 16, 1956, 62 y.o.   MRN: [id number]  HPI: Sharon [name] is a 62 y.o. female presenting on 03/27/2018 for Depression (Here for 2 mo f/u.) and Anxiety   Reviewed ER note - seen for abd pain and nausea and diarrhea (chronic) and trouble sleeping - thought anxiety contributing to GI sxs.   Last month we started lexapro 25m in place of celexa 439m Amitriptyline was not effective. She continues lorazepam nightly for sleep. Ongoing stressors but not overwhelming. She saw counselor in the past but is not currently interested in returning.   Still having trouble sleeping. Works 2nd shift. Needs to unwind when she gets home (midnight). Bad dreams. Sleep initiation and maintenance insomnia.   Relevant past medical, surgical, family and social history reviewed and updated as indicated. Interim medical history since our last visit reviewed. Allergies and medications reviewed and updated. Outpatient Medications Prior to Visit  Medication Sig Dispense Refill  . cyanocobalamin (,VITAMIN B-12,) 1000 MCG/ML injection Inject 1,000 mcg into the muscle every 30 (thirty) days.    . diphenhydrAMINE (BENADRYL) 25 mg capsule Take 12.5 mg by mouth at bedtime as needed for sleep.    . Marland Kitchenscitalopram (LEXAPRO) 20 MG tablet Take 1 tablet (20 mg total) by mouth daily. 30 tablet 6  . LORazepam (ATIVAN) 0.5 MG tablet Take 1 tablet (0.5 mg total) by mouth at bedtime as needed for sleep. 30 tablet 0  . metoprolol tartrate (LOPRESSOR) 50 MG tablet TAKE ONE TABLET BY MOUTH TWICE DAILY 180 tablet 3  . ondansetron (ZOFRAN) 4 MG tablet Take 1 tablet (4 mg total) by mouth every 8 (eight) hours as needed for nausea or vomiting. 8 tablet 0   . vedolizumab (ENTYVIO) 300 MG injection Inject into the vein every 8 (eight) weeks.     . Marland Kitchenspirin EC 81 MG tablet Take 1 tablet (81 mg total) by mouth daily. Over the counter (Patient not taking: Reported on 02/12/2018) 30 tablet 3   No facility-administered medications prior to visit.      Per HPI unless specifically indicated in ROS section below Review of Systems     Objective:    BP 118/70 (BP Location: Right Arm, Patient Position: Sitting, Cuff Size: Normal)   Pulse (!) 56   Temp 98.4 F (36.9 C) (Oral)   Ht 5' 1"  (1.549 m)   Wt 158 lb 12 oz (72 kg)   SpO2 97%   BMI 30.00 kg/m   Wt Readings from Last 3 Encounters:  03/27/18 158 lb 12 oz (72 kg)  03/17/18 155 lb (70.3 kg)  02/12/18 158 lb (71.7 kg)    Physical Exam  Constitutional: She appears well-developed and well-nourished. No distress.  HENT:  Mouth/Throat: Oropharynx is clear and moist. No oropharyngeal exudate.  Skin: Skin is warm and dry. No rash noted.  Psychiatric: She has a normal mood and affect. Her behavior is normal.  Vitals reviewed.   GAD 7 : Generalized Anxiety Score 03/27/2018 01/24/2018 11/09/2015  Nervous, Anxious, on Edge 1 3 3   Control/stop worrying 1 3 1   Worry too much -  different things 1 0 1  Trouble relaxing 3 0 1  Restless 0 2 1  Easily annoyed or irritable 1 2 3   Afraid - awful might happen 1 2 1   Total GAD 7 Score 8 12 11    Depression screen [facility] 2/9 03/27/2018 01/24/2018 01/24/2018 11/29/2017 11/09/2015  Decreased Interest 1 1 0 0 1  Down, Depressed, Hopeless 1 1 0 2 0  PHQ - 2 Score 2 2 0 2 1  Altered sleeping 3 3 - 3 3  Tired, decreased energy 3 3 - 3 3  Change in appetite 0 0 - 1 2  Feeling bad or failure about yourself  1 0 - 1 0  Trouble concentrating 2 3 - 3 2  Moving slowly or fidgety/restless 0 3 - 1 1  Suicidal thoughts 0 0 - 0 0  PHQ-9 Score 11 14 - 14 12  Difficult doing work/chores - - - - Somewhat difficult      Assessment & Plan:  Requests PPD placed today for  school purposes. Will need to return Thursday for reading Problem List Items Addressed This Visit    Chronic insomnia    Still struggles with insomnia and 2nd shift. Lorazepam helps with anxiety but unsure how effective it is to help with her sleep. Will trial trazodone 25-73m nightly. Amitriptyline lost effect and caused dry mouth. Reviewed bedtime routine/sleep hygiene.       Anxiety and depression - Primary    Pt overall feels well. Continue lexapro.  PHQ9 and GAD7 with improved readings today.       Relevant Medications   traZODone (DESYREL) 50 MG tablet    Other Visit Diagnoses    Encounter for PPD test       Relevant Orders   TB Skin Test (Completed)       Meds ordered this encounter  Medications  . traZODone (DESYREL) 50 MG tablet    Sig: Take 0.5-1 tablets (25-50 mg total) by mouth at bedtime as needed for sleep.    Dispense:  30 tablet    Refill:  3   Orders Placed This Encounter  Procedures  . TB Skin Test    Order Specific Question:   Has patient ever tested positive?    Answer:   No    Follow up plan: Return in about 3 months (around 06/27/2018) for follow up visit.  JRia Bush MD

## 2018-03-28 ENCOUNTER — Ambulatory Visit: Payer: 59

## 2018-03-29 ENCOUNTER — Encounter: Payer: Self-pay | Admitting: *Deleted

## 2018-03-29 LAB — TB SKIN TEST
Induration: 0 mm
TB Skin Test: NEGATIVE

## 2018-04-14 ENCOUNTER — Emergency Department
Admission: EM | Admit: 2018-04-14 | Discharge: 2018-04-14 | Disposition: A | Discharge status: Home / Self Care | Payer: 59 | Attending: Emergency Medicine | Admitting: Emergency Medicine

## 2018-04-14 ENCOUNTER — Emergency Department: Payer: 59

## 2018-04-14 ENCOUNTER — Encounter: Payer: Self-pay | Admitting: Emergency Medicine

## 2018-04-14 ENCOUNTER — Other Ambulatory Visit: Payer: Self-pay

## 2018-04-14 DIAGNOSIS — G459 Transient cerebral ischemic attack, unspecified: Secondary | ICD-10-CM | POA: Diagnosis not present

## 2018-04-14 DIAGNOSIS — Z8673 Personal history of transient ischemic attack (TIA), and cerebral infarction without residual deficits: Secondary | ICD-10-CM | POA: Insufficient documentation

## 2018-04-14 DIAGNOSIS — Z87891 Personal history of nicotine dependence: Secondary | ICD-10-CM | POA: Insufficient documentation

## 2018-04-14 DIAGNOSIS — Z79899 Other long term (current) drug therapy: Secondary | ICD-10-CM | POA: Diagnosis not present

## 2018-04-14 DIAGNOSIS — G43909 Migraine, unspecified, not intractable, without status migrainosus: Secondary | ICD-10-CM | POA: Diagnosis not present

## 2018-04-14 DIAGNOSIS — R110 Nausea: Secondary | ICD-10-CM | POA: Diagnosis not present

## 2018-04-14 DIAGNOSIS — Z7982 Long term (current) use of aspirin: Secondary | ICD-10-CM | POA: Insufficient documentation

## 2018-04-14 DIAGNOSIS — R200 Anesthesia of skin: Secondary | ICD-10-CM | POA: Diagnosis not present

## 2018-04-14 DIAGNOSIS — M6281 Muscle weakness (generalized): Secondary | ICD-10-CM | POA: Insufficient documentation

## 2018-04-14 DIAGNOSIS — Z8541 Personal history of malignant neoplasm of cervix uteri: Secondary | ICD-10-CM | POA: Diagnosis not present

## 2018-04-14 DIAGNOSIS — R42 Dizziness and giddiness: Secondary | ICD-10-CM | POA: Insufficient documentation

## 2018-04-14 DIAGNOSIS — I10 Essential (primary) hypertension: Secondary | ICD-10-CM | POA: Insufficient documentation

## 2018-04-14 DIAGNOSIS — R519 Headache, unspecified: Secondary | ICD-10-CM

## 2018-04-14 DIAGNOSIS — R51 Headache: Secondary | ICD-10-CM | POA: Insufficient documentation

## 2018-04-14 LAB — COMPREHENSIVE METABOLIC PANEL
ALT: 21 U/L (ref 0–44)
ANION GAP: 9 (ref 5–15)
AST: 42 U/L — AB (ref 15–41)
Albumin: 4 g/dL (ref 3.5–5.0)
Alkaline Phosphatase: 85 U/L (ref 38–126)
BILIRUBIN TOTAL: 0.6 mg/dL (ref 0.3–1.2)
BUN: 6 mg/dL (ref 6–20)
CO2: 23 mmol/L (ref 22–32)
Calcium: 8.8 mg/dL — ABNORMAL LOW (ref 8.9–10.3)
Chloride: 108 mmol/L (ref 98–111)
Creatinine, Ser: 0.83 mg/dL (ref 0.44–1.00)
Glucose, Bld: 133 mg/dL — ABNORMAL HIGH (ref 70–99)
Potassium: 3.7 mmol/L (ref 3.5–5.1)
Sodium: 140 mmol/L (ref 135–145)
TOTAL PROTEIN: 7.4 g/dL (ref 6.5–8.1)

## 2018-04-14 LAB — CBC
HEMATOCRIT: 37 % (ref 35.0–47.0)
Hemoglobin: 13.1 g/dL (ref 12.0–16.0)
MCH: 32.1 pg (ref 26.0–34.0)
MCHC: 35.4 g/dL (ref 32.0–36.0)
MCV: 90.7 fL (ref 80.0–100.0)
Platelets: 259 10*3/uL (ref 150–440)
RBC: 4.08 MIL/uL (ref 3.80–5.20)
RDW: 14.2 % (ref 11.5–14.5)
WBC: 9.7 10*3/uL (ref 3.6–11.0)

## 2018-04-14 LAB — URINALYSIS, COMPLETE (UACMP) WITH MICROSCOPIC
BACTERIA UA: NONE SEEN
BILIRUBIN URINE: NEGATIVE
Glucose, UA: NEGATIVE mg/dL
KETONES UR: NEGATIVE mg/dL
Nitrite: NEGATIVE
PROTEIN: NEGATIVE mg/dL
Specific Gravity, Urine: 1.009 (ref 1.005–1.030)
pH: 6 (ref 5.0–8.0)

## 2018-04-14 LAB — DIFFERENTIAL
Basophils Absolute: 0.1 10*3/uL (ref 0–0.1)
Basophils Relative: 1 %
EOS ABS: 0.3 10*3/uL (ref 0–0.7)
EOS PCT: 3 %
LYMPHS ABS: 3.2 10*3/uL (ref 1.0–3.6)
Lymphocytes Relative: 33 %
MONO ABS: 0.5 10*3/uL (ref 0.2–0.9)
MONOS PCT: 5 %
Neutro Abs: 5.7 10*3/uL (ref 1.4–6.5)
Neutrophils Relative %: 58 %

## 2018-04-14 LAB — APTT: aPTT: 29 seconds (ref 24–36)

## 2018-04-14 LAB — GLUCOSE, CAPILLARY: GLUCOSE-CAPILLARY: 74 mg/dL (ref 70–99)

## 2018-04-14 LAB — PROTIME-INR
INR: 0.94
Prothrombin Time: 12.5 seconds (ref 11.4–15.2)

## 2018-04-14 LAB — TROPONIN I

## 2018-04-14 MED ORDER — DIPHENHYDRAMINE HCL 50 MG/ML IJ SOLN
25.0000 mg | Freq: Once | INTRAMUSCULAR | Status: AC
  Administered 2018-04-14: 25 mg via INTRAVENOUS
  Filled 2018-04-14: qty 1

## 2018-04-14 MED ORDER — PROCHLORPERAZINE EDISYLATE 10 MG/2ML IJ SOLN
10.0000 mg | Freq: Once | INTRAMUSCULAR | Status: AC
  Administered 2018-04-14: 10 mg via INTRAVENOUS
  Filled 2018-04-14: qty 2

## 2018-04-14 MED ORDER — BUTALBITAL-APAP-CAFFEINE 50-325-40 MG PO TABS: 1.0000 | ORAL_TABLET | Freq: Four times a day (QID) | ORAL | 0 refills | Status: AC | PRN

## 2018-04-14 MED ORDER — SODIUM CHLORIDE 0.9 % IV BOLUS
1000.0000 mL | Freq: Once | INTRAVENOUS | Status: AC
  Administered 2018-04-14: 1000 mL via INTRAVENOUS

## 2018-04-14 NOTE — ED Notes (Signed)
Iv removed by [place], rn.

## 2018-04-14 NOTE — ED Notes (Signed)
Report from previous rn.

## 2018-04-14 NOTE — ED Triage Notes (Signed)
Pt states woke up this morning "feeling real shakey inside and out but not like [she] was going to pass out, just not feeling well". Pt also reports L arm numbness. Pt reports intermittently "going into a daze" over the last few days and reports time loss during these episodes of going into a daze. Pt also reports over the last few days feeling like she "can't walk in a straight line". Pt reports having to lean on walls and almost falling. Pt also reports that her eyes "hurt and water all the time". Pt is alert and oriented x 4, equal grip strengths bilaterally, facial symmetry intact, reports mild L arm numbness that started this morning at approx 10am.

## 2018-04-14 NOTE — ED Provider Notes (Signed)
[facility] Emergency Department Provider Note ____________________________________________   First MD Initiated Contact with Patient 04/14/18 1550     (approximate)  I have reviewed the triage vital signs and the nursing notes.   HISTORY  Chief Complaint Weakness  HPI Sharon [name] is a 62 y.o. female with history of anxiety, depression as well as Crohn's disease and migraine headaches was presented to the emergency department today with 1 week of worsening headache that is to the frontal region as well as the left side of her head.  Says that it is associated with nausea and occasional dizziness.  Also had aching and numbness in her left upper extremity this morning which is resolved.  Says that she had several episodes where she also felt like she was going to pass out due to lightheadedness and generalized weakness.  However, says that this has improved over the course of today.  Not reporting any chest pain or shortness of breath at this time.  Past Medical History:  Diagnosis Date  . Anxiety and depression   . Arthritis   . B12 deficiency    pernicious anemia - monthly b12 shots  . Cervical cancer ([place]) 12/2008   s/p hysterectomy  . Crohn's disease ([place]) 2000   h/o stenotic crohn's ileitis, active in TI s/p resections 2000, 2016 PPD neg ([facility] Dr. [name]); has been on cimzia, remicade, 6MP, entocort, now stable on Entyvio ([facility] [place])  . Genital warts   . GERD (gastroesophageal reflux disease)    severe, daily sxs if off PPI  . Hepatic steatosis 04/2011   diffuse on CT, 82m gallbladder polyp  . History of anemia    attributed to crohn's  . History of chicken pox   . History of syphilis 1980s  . HTN (hypertension)   . Migraines    and frequent other headaches (sinus or stress)  . Scalp psoriasis   . Sensorineural hearing loss of both ears 12/2012   high freq  . Takotsubo syndrome 07/2012   cath 08/01/12, normal coronaries, LVEF  65%  . TIA (transient ischemic attack) [facility]   [facility] (w/u negative - carotids, echo, TC doppler, and MRI normal)  . Tobacco abuse     Patient Active Problem List   Diagnosis Date Noted  . Chronic insomnia 03/27/2018  . Obesity, Class I, BMI 30.0-34.9 (see actual BMI) 01/24/2018  . Hematuria 01/01/2016  . Hyperglycemia 05/09/2013  . HLD (hyperlipidemia) 02/12/2013  . Takotsubo syndrome   . Chest pain 08/02/2012  . Bilateral hearing loss 06/27/2012  . GERD (gastroesophageal reflux disease)   . History of pneumonia 01/18/2012  . Headache 10/13/2011  . Healthcare maintenance 08/16/2011  . Leukopenia 08/16/2011  . Vitamin B12 deficiency   . HTN (hypertension)   . Migraines   . [place]'s disease ([place]   . Anxiety and depression   . Ex-smoker   . TIA (transient ischemic attack)   . Hepatic steatosis 04/30/2011    Past Surgical History:  Procedure Laterality Date  . abd UKorea 04/2011   diffuse hepatic steatosis, 535mpolyp in gallbaldder fundus, no stones, mod abd ath  . APPENDECTOMY  1998  . CARDIAC CATHETERIZATION  07/2012   normal LV fxn, widely patent coronaries  . CERVICAL BIOPSY  W/ LOOP ELECTRODE EXCISION  10/2008  . COLON RESECTION  10/2014   [facility] scar tissue [facility] from [facility] . COLONOSCOPY  10/2008  ileo-colonic anastomosis, ielocolonic crohn's  . COLONOSCOPY  05/2011   ileo-colonic anastomosis normal, normal mucosa  . COLONOSCOPY  10/15/2012   focal inflammation at anastomosis, no active crohn's, planning MR enterograph [name])  . COLONOSCOPY  09/2014   [facility]  . ESOPHAGOGASTRODUODENOSCOPY  05/2011   nl esophagus, gastritis, nl duodenum  . hospitalization  05/2010   TIA vs complex migraine - w/u negative - head CT, MRI/MRA, carotid dopplers, echo.  treated with ASA and tramadol  . KNEE SURGERY Left   . LEFT HEART CATHETERIZATION WITH CORONARY ANGIOGRAM N/A 08/01/2012   Procedure: LEFT HEART CATHETERIZATION  WITH CORONARY ANGIOGRAM;  Surgeon: Sherren Mocha, MD;  Location: [facility] CATH LAB;  Service: Cardiovascular;  Laterality: N/A;  . RIGHT COLECTOMY  2000   crohn's disease, ileocecal resection  . SHOULDER ARTHROSCOPY W/ ROTATOR CUFF REPAIR Right 05/31/2012   [facility] Tamera Punt)  . US ECHOCARDIOGRAPHY  05/2010   nl LV fxn ,EF 60%  . VAGINAL HYSTERECTOMY  12/2008   [facility]/BSO    Prior to Admission medications   Medication Sig Start Date End Date Taking? Authorizing Provider  aspirin EC 81 MG tablet Take 1 tablet (81 mg total) by mouth daily. Over the counter Patient not taking: Reported on 02/12/2018 05/01/16   Rai, Vernelle Emerald, MD  cyanocobalamin (,VITAMIN B-12,) 1000 MCG/ML injection Inject 1,000 mcg into the muscle every 30 (thirty) days.    [provider]  diphenhydrAMINE (BENADRYL) 25 mg capsule Take 12.5 mg by mouth at bedtime as needed for sleep.    [provider]  escitalopram (LEXAPRO) 20 MG tablet Take 1 tablet (20 mg total) by mouth daily. 02/18/18   Ria Bush, MD  LORazepam (ATIVAN) 0.5 MG tablet Take 1 tablet (0.5 mg total) by mouth at bedtime as needed for sleep. 03/22/18   Tonia Ghent, MD  metoprolol tartrate (LOPRESSOR) 50 MG tablet TAKE ONE TABLET BY MOUTH TWICE DAILY 06/13/17   Ria Bush, MD  ondansetron [facility]) 4 MG tablet Take 1 tablet (4 mg total) by mouth every 8 (eight) hours as needed for nausea or vomiting. 03/17/18   Schuyler Amor, MD  traZODone (DESYREL) 50 MG tablet Take 0.5-1 tablets (25-50 mg total) by mouth at bedtime as needed for sleep. 03/27/18   Ria Bush, MD  vedolizumab (ENTYVIO) 300 MG injection Inject into the vein every 8 (eight) weeks.     [provider]    Allergies Infliximab; Codeine; Flagyl [metronidazole]; Hydrocodone; Ibuprofen; and Remeron [mirtazapine]  Family History  Problem Relation Age of Onset  . Crohn's disease Daughter        crohn's, ulcerative colitis  . Ulcerative colitis  Daughter   . Stroke Mother   . Hypertension Mother   . Hyperlipidemia Mother   . Hypertension Father   . Crohn's disease Father   . Crohn's disease Sister   . Cancer Sister        ovarian  . Crohn's disease Paternal Aunt   . Crohn's disease Paternal Uncle   . Diabetes Neg Hx     Social History Social History   Tobacco Use  . Smoking status: Former Smoker    Packs/day: 0.33    Years: 10.00    Pack years: 3.30    Types: Cigarettes    Last attempt to quit: 11/14/2016    Years since quitting: 1.4  . Smokeless tobacco: Never Used  . Tobacco comment: Quit within past yr  Substance Use Topics  . Alcohol use: Yes  Comment: occ  . Drug use: No    Comment: extensive drug use, remotely    Review of Systems  Constitutional: No fever/chills Eyes: No visual changes. ENT: No sore throat. Cardiovascular: Denies chest pain. Respiratory: Denies shortness of breath. Gastrointestinal: No abdominal pain.  no vomiting.  No diarrhea.  No constipation. Genitourinary: Negative for dysuria. Musculoskeletal: Negative for back pain. Skin: Negative for rash. Neurological: Negative for focal weakness    ____________________________________________   PHYSICAL EXAM:  VITAL SIGNS: ED Triage Vitals  Enc Vitals Group     BP 04/14/18 1508 (!) 146/41     Pulse Rate 04/14/18 1508 62     Resp 04/14/18 1508 14     Temp 04/14/18 1508 98.5 F (36.9 C)     Temp Source 04/14/18 1508 Oral     SpO2 04/14/18 1508 92 %     Weight 04/14/18 1508 158 lb (71.7 kg)     Height 04/14/18 1508 5' 1.5" (1.562 m)     Head Circumference --      Peak Flow --      Pain Score 04/14/18 1519 5     Pain Loc --      Pain Edu? --      Excl. in [place]? --     Constitutional: Alert and oriented. Well appearing and in no acute distress. Eyes: Conjunctivae are normal.  Head: Atraumatic. Nose: No congestion/rhinnorhea. Mouth/Throat: Mucous membranes are moist.  Neck: No stridor.   Cardiovascular: Normal rate,  regular rhythm. Grossly normal heart sounds.   Respiratory: Normal respiratory effort.  No retractions. Lungs CTAB. Gastrointestinal: Soft with minimal left upper quadrant tenderness to palpation without any rebound or guarding. No distention. No CVA tenderness. Musculoskeletal: No lower extremity tenderness nor edema.  No joint effusions. Neurologic:  Normal speech and language. No gross focal neurologic deficits are appreciated. Skin:  Skin is warm, dry and intact. No rash noted. Psychiatric: Mood and affect are normal. Speech and behavior are normal.  ____________________________________________   LABS (all labs ordered are listed, but only abnormal results are displayed)  Labs Reviewed  COMPREHENSIVE METABOLIC PANEL - Abnormal; Notable for the following components:      Result Value   Glucose, Bld 133 (*)    Calcium 8.8 (*)    AST 42 (*)    All other components within normal limits  URINALYSIS, COMPLETE (UACMP) WITH MICROSCOPIC - Abnormal; Notable for the following components:   Color, Urine YELLOW (*)    APPearance CLEAR (*)    Hgb urine dipstick SMALL (*)    Leukocytes, UA TRACE (*)    All other components within normal limits  PROTIME-INR  APTT  CBC  DIFFERENTIAL  TROPONIN I  GLUCOSE, CAPILLARY  CBG MONITORING, ED   ____________________________________________  EKG  ED ECG REPORT I, Doran Stabler, the attending physician, personally viewed and interpreted this ECG.   Date: 04/14/2018  EKG Time: 1522  Rate: 61  Rhythm: normal sinus rhythm  Axis: Normal  Intervals:none  ST&T Change: No ST segment elevation or depression.  No abnormal T wave inversion.  ____________________________________________  RADIOLOGY  CT head without acute finding. ____________________________________________   PROCEDURES  Procedure(s) performed:   Procedures  Critical Care performed:   ____________________________________________   INITIAL IMPRESSION / ASSESSMENT  AND PLAN / ED COURSE  Pertinent labs & imaging results that were available during my care of the patient were reviewed by me and considered in my medical decision making (see chart for details).  Differential  diagnosis includes, but is not limited to, intracranial hemorrhage, meningitis/encephalitis, previous head trauma, cavernous venous thrombosis, tension headache, temporal arteritis, migraine or migraine equivalent, idiopathic intracranial hypertension, and non-specific headache. As part of my medical decision making, I reviewed the following data within the electronic MEDICAL RECORD NUMBER Notes from prior ED visits  ----------------------------------------- 7:06 PM on 04/14/2018 -----------------------------------------  Patient with trace leukocytes and few white blood cells on the urine.  We will send for culture.  Patient says that her headache is under 3.  Says that she is no longer dizzy and I observed her ambulating throughout the room without issue.  Also nausea has abated.  Do not believe the patient has a temporal arteritis.  No nodularity nor is there any tenderness to the bilateral temporal artery distributions.  There is no claudication.  Reassuring head CT.  Patient to be discharged with Fioricet.  Will follow with primary care.  Mild abdominal pain of unclear significance.  However, possibly gastritis.  Normal white blood cell count.  Unlikely to be Crohn's flare. ____________________________________________   FINAL CLINICAL IMPRESSION(S) / ED DIAGNOSES  Headache.  Nausea.  NEW MEDICATIONS STARTED DURING THIS VISIT:  New Prescriptions   No medications on file     Note:  This document was prepared using Dragon voice recognition software and may include unintentional dictation errors.     Orbie Pyo, MD 04/14/18 [phone]

## 2018-04-14 NOTE — ED Notes (Signed)
Pt  Feels  Much better

## 2018-04-23 ENCOUNTER — Other Ambulatory Visit: Payer: Self-pay | Admitting: Family Medicine

## 2018-04-23 NOTE — Telephone Encounter (Signed)
Name of Medication: Lorazepam Name of Pharmacy: [facility] or Written Date and Quantity: 03/27/18, #30 Last Office Visit and Type: 03/27/18, f/u Next Office Visit and Type: 06/26/18, f/u Last Controlled Substance Agreement Date: 05/01/15 Last UDS: 05/01/15

## 2018-04-26 NOTE — Telephone Encounter (Signed)
Patient checking status, out of meds. CB @# [id number]

## 2018-04-26 NOTE — Telephone Encounter (Signed)
E prescribed. plz notify patient.

## 2018-04-26 NOTE — Telephone Encounter (Signed)
Lattie Haw CMA said to send to Dr Danise Mina for review.

## 2018-04-27 NOTE — Telephone Encounter (Signed)
Spoke with pt notifying her the rx was sent to pharmacy. She expresses her thanks.

## 2018-05-07 DIAGNOSIS — K5080 Crohn's disease of both small and large intestine without complications: Secondary | ICD-10-CM | POA: Diagnosis not present

## 2018-05-23 ENCOUNTER — Encounter: Payer: Self-pay | Admitting: Family Medicine

## 2018-05-23 ENCOUNTER — Encounter (INDEPENDENT_AMBULATORY_CARE_PROVIDER_SITE_OTHER): Payer: Self-pay

## 2018-05-23 ENCOUNTER — Encounter: Payer: Self-pay | Admitting: Emergency Medicine

## 2018-05-23 ENCOUNTER — Ambulatory Visit: Payer: 59 | Admitting: Family Medicine

## 2018-05-23 ENCOUNTER — Telehealth: Payer: Self-pay | Admitting: *Deleted

## 2018-05-23 ENCOUNTER — Other Ambulatory Visit: Payer: Self-pay | Admitting: Family Medicine

## 2018-05-23 VITALS — BP 128/70 | HR 68 | Temp 98.3°F | Ht 61.5 in | Wt 155.0 lb

## 2018-05-23 DIAGNOSIS — G43909 Migraine, unspecified, not intractable, without status migrainosus: Secondary | ICD-10-CM | POA: Diagnosis not present

## 2018-05-23 MED ORDER — ONDANSETRON 8 MG PO TBDP: 8.0000 mg | ORAL_TABLET | Freq: Three times a day (TID) | ORAL | 0 refills | Status: DC | PRN

## 2018-05-23 MED ORDER — LORAZEPAM 0.5 MG PO TABS: 0.5000 mg | ORAL_TABLET | Freq: Every evening | ORAL | 0 refills | Status: DC | PRN

## 2018-05-23 NOTE — Telephone Encounter (Signed)
Spoke with pt asking about letter for school. Pt states she saw Sharon [name] today and she took care of everything for her.  Expresses her thanks for the call.

## 2018-05-23 NOTE — Telephone Encounter (Signed)
Copied from [place] [phone]. Topic: Quick Communication - Rx Refill/Question >> May 23, 2018  1:21 PM Yvette Rack wrote: Medication: LORazepam (ATIVAN) 0.5 MG tablet  Has the patient contacted their pharmacy? Yes.  But they called the wrong provider (Agent: If no, request that the patient contact the pharmacy for the refill.) (Agent: If yes, when and what did the pharmacy advise?)  Preferred Pharmacy (with phone number or street name): [facility] [address], [place] - [place] [phone] (Phone) [phone] (Fax)    Agent: Please be advised that RX refills may take up to 3 business days. We ask that you follow-up with your pharmacy.

## 2018-05-23 NOTE — Telephone Encounter (Signed)
Eprescribed.

## 2018-05-23 NOTE — Patient Instructions (Signed)
I have sent anti nausea medicine to your pharmacy  Hydrate really well tonight- aim for light yellow urine  Take a fioricet and if no improvement in 1-2 hours, can take another one    Migraine Headache A migraine headache is a very strong throbbing pain on one side or both sides of your head. Migraines can also cause other symptoms. Talk with your doctor about what things may bring on (trigger) your migraine headaches. Follow these instructions at home: Medicines  Take over-the-counter and prescription medicines only as told by your doctor.  Do not drive or use heavy machinery while taking prescription pain medicine.  To prevent or treat constipation while you are taking prescription pain medicine, your doctor may recommend that you: ? Drink enough fluid to keep your pee (urine) clear or pale yellow. ? Take over-the-counter or prescription medicines. ? Eat foods that are high in fiber. These include fresh fruits and vegetables, whole grains, and beans. ? Limit foods that are high in fat and processed sugars. These include fried and sweet foods. Lifestyle  Avoid alcohol.  Do not use any products that contain nicotine or tobacco, such as cigarettes and e-cigarettes. If you need help quitting, ask your doctor.  Get at least 8 hours of sleep every night.  Limit your stress. General instructions   Keep a journal to find out what may bring on your migraines. For example, write down: ? What you eat and drink. ? How much sleep you get. ? Any change in what you eat or drink. ? Any change in your medicines.  If you have a migraine: ? Avoid things that make your symptoms worse, such as bright lights. ? It may help to lie down in a dark, quiet room. ? Do not drive or use heavy machinery. ? Ask your doctor what activities are safe for you.  Keep all follow-up visits as told by your doctor. This is important. Contact a doctor if:  You get a migraine that is different or worse than  your usual migraines. Get help right away if:  Your migraine gets very bad.  You have a fever.  You have a stiff neck.  You have trouble seeing.  Your muscles feel weak or like you cannot control them.  You start to lose your balance a lot.  You start to have trouble walking.  You pass out (faint). This information is not intended to replace advice given to you by your health care provider. Make sure you discuss any questions you have with your health care provider. Document Released: 05/24/2008 Document Revised: 03/04/2016 Document Reviewed: 02/01/2016 Elsevier Interactive Patient Education  2018 [employer].

## 2018-05-23 NOTE — Telephone Encounter (Signed)
Copied from [place] [phone]. Topic: General - Other >> May 23, 2018  1:19 PM Yvette Rack wrote: Reason for CRM: pt calling wanting Ria Bush to write her a note about her headaches for school

## 2018-05-23 NOTE — Progress Notes (Signed)
Subjective:    Patient ID: Sharon [name], female    DOB: Jul 29, 1956, 62 y.o.   MRN: [id number]  HPI This is a 62 yo female who presents today with headache. Started last night. Almost finished with CNA school. Has been taking Fioricet 1/4 to 1/2 tablet prn, didn't take one last night. Took a benadryl last night which seemed to help some. Currently still has headache, frontal. Some pain in temples, thinks temple pain is tension. Positive for light and sound sensitivity. Some nausea, occasional vomiting with headache. Out of ondansetron. Poor fluid intake. Went to headache clinic at [facility], provider she saw left. Not on any prophylaxis. Denies blurred or double vision, weakness, lethargy. She requests written excuses for school and work. She works at [employer], was supposed to work Midwife, but feels unable.   Past Medical History:  Diagnosis Date  . Anxiety and depression   . Arthritis   . B12 deficiency    pernicious anemia - monthly b12 shots  . Cervical cancer ([place]) 12/2008   s/p hysterectomy  . Crohn's disease ([place]) 2000   h/o stenotic crohn's ileitis, active in TI s/p resections 2000, 2016 PPD neg ([facility] Dr. [name]); has been on cimzia, remicade, 6MP, entocort, now stable on Entyvio ([facility] at [place])  . Genital warts   . GERD (gastroesophageal reflux disease)    severe, daily sxs if off PPI  . Hepatic steatosis 04/2011   diffuse on CT, 78m gallbladder polyp  . History of anemia    attributed to crohn's  . History of chicken pox   . History of syphilis 1980s  . HTN (hypertension)   . Migraines    and frequent other headaches (sinus or stress)  . Scalp psoriasis   . Sensorineural hearing loss of both ears 12/2012   high freq  . Takotsubo syndrome 07/2012   cath 08/01/12, normal coronaries, LVEF 65%  . TIA (transient ischemic attack) 05/2010   [facility] (w/u negative - carotids, echo, TC doppler, and MRI normal)  . Tobacco abuse    Past Surgical  History:  Procedure Laterality Date  . abd [place]orea 04/2011   diffuse hepatic steatosis, 556mpolyp in gallbaldder fundus, no stones, mod abd ath  . APPENDECTOMY  1998  . CARDIAC CATHETERIZATION  07/2012   normal LV fxn, widely patent coronaries  . CERVICAL BIOPSY  W/ LOOP ELECTRODE EXCISION  10/2008  . COLON RESECTION  10/2014   [facility] scar tissue [facility] . COLONOSCOPY  10/2008   ileo-colonic anastomosis, ielocolonic crohn's  . COLONOSCOPY  05/2011   ileo-colonic anastomosis normal, normal mucosa  . COLONOSCOPY  10/15/2012   focal inflammation at anastomosis, no active crohn's, planning MR enterograph ([facility] . COLONOSCOPY  09/2014   [facility]  . ESOPHAGOGASTRODUODENOSCOPY  05/2011   nl esophagus, gastritis, nl duodenum  . hospitalization  05/2010   TIA vs complex migraine - w/u negative - head CT, MRI/MRA, carotid dopplers, echo.  treated with ASA and tramadol  . KNEE SURGERY Left   . LEFT HEART CATHETERIZATION WITH CORONARY ANGIOGRAM N/A 08/01/2012   Procedure: LEFT HEART CATHETERIZATION WITH CORONARY ANGIOGRAM;  Surgeon: MiSherren MochaMD;  [facility];  Service: Cardiovascular;  Laterality: N/A;  . RIGHT COLECTOMY  2000   crohn's disease, ileocecal resection  . SHOULDER ARTHROSCOPY W/ ROTATOR CUFF REPAIR Right 05/31/2012   [facility] [place]  05/2010  nl LV fxn ,EF 60%  . VAGINAL HYSTERECTOMY  12/2008   LAVH/BSO   Family History  Problem Relation Age of Onset  . Crohn's disease Daughter        crohn's, ulcerative colitis  . Ulcerative colitis Daughter   . Stroke Mother   . Hypertension Mother   . Hyperlipidemia Mother   . Hypertension Father   . Crohn's disease Father   . Crohn's disease Sister   . Cancer Sister        ovarian  . Crohn's disease Paternal Aunt   . Crohn's disease Paternal Uncle   . Diabetes Neg Hx    Social History   Tobacco Use  . Smoking status: Former Smoker    Packs/day:  0.33    Years: 10.00    Pack years: 3.30    Types: Cigarettes    Last attempt to quit: 11/14/2016    Years since quitting: 1.5  . Smokeless tobacco: Never Used  . Tobacco comment: Quit within past yr  Substance Use Topics  . Alcohol use: Yes    Comment: occ  . Drug use: No    Comment: extensive drug use, remotely      Review of Systems Per HPI    Objective:   Physical Exam  Constitutional: She is oriented to person, place, and time. She appears well-developed and well-nourished. No distress.  HENT:  Head: Normocephalic and atraumatic.  Eyes: Pupils are equal, round, and reactive to light. Conjunctivae and EOM are normal.  Neck: Normal range of motion. Neck supple.  Cardiovascular: Normal rate, regular rhythm and normal heart sounds.  Pulmonary/Chest: Effort normal and breath sounds normal.  Musculoskeletal: She exhibits no edema.  UE/LE strength 5/5  Neurological: She is alert and oriented to person, place, and time. She displays normal reflexes. No cranial nerve deficit. She exhibits normal muscle tone. Coordination normal.  Skin: Skin is warm and dry. She is not diaphoretic.  Psychiatric: She has a normal mood and affect. Her behavior is normal. Judgment and thought content normal.  Vitals reviewed.     BP 128/70 (BP Location: Right Arm, Patient Position: Sitting, Cuff Size: Normal)   Pulse 68   Temp 98.3 F (36.8 C) (Oral)   Ht 5' 1.5" (1.562 m)   Wt 155 lb (70.3 kg)   SpO2 97%   BMI 28.81 kg/m  Wt Readings from Last 3 Encounters:  05/23/18 155 lb (70.3 kg)  04/14/18 158 lb (71.7 kg)  03/27/18 158 lb 12 oz (72 kg)       Assessment & Plan:  1. Migraine without status migrainosus, not intractable, unspecified migraine type - typical migraine for her, declines in office Toradol injection, states that she has plenty of Fioricet at home. She can take one when she gets home and can repeat x 1 if no improvement in 1-2 hours - encouraged her to hydrate aggressively  this afternoon and evening as I suspect mild dehydration playing a role - she has follow up on file with Dr. Danise Mina next month and can discuss recurrent headaches  - RTC precautions reviewed - ondansetron (ZOFRAN-ODT) 8 MG disintegrating tablet; Take 1 tablet (8 mg total) by mouth every 8 (eight) hours as needed for nausea.  Dispense: 20 tablet; Refill: 0   Clarene Reamer, FNP-BC  [facility] [place] [facility]  05/24/2018 9:23 AM

## 2018-05-23 NOTE — Telephone Encounter (Signed)
lorazepam refill Last Refill:04/26/18 # 30 Last OV: 01/24/18 PCP: Dr Danise Mina Pharmacy: [facility] [address] [place], [place]

## 2018-05-24 ENCOUNTER — Encounter: Payer: Self-pay | Admitting: Family Medicine

## 2018-06-09 ENCOUNTER — Emergency Department
Admission: EM | Admit: 2018-06-09 | Discharge: 2018-06-09 | Disposition: A | Discharge status: Home / Self Care | Payer: 59 | Attending: Emergency Medicine | Admitting: Emergency Medicine

## 2018-06-09 ENCOUNTER — Encounter: Payer: Self-pay | Admitting: Emergency Medicine

## 2018-06-09 ENCOUNTER — Other Ambulatory Visit: Payer: Self-pay

## 2018-06-09 DIAGNOSIS — Z8673 Personal history of transient ischemic attack (TIA), and cerebral infarction without residual deficits: Secondary | ICD-10-CM | POA: Diagnosis not present

## 2018-06-09 DIAGNOSIS — I10 Essential (primary) hypertension: Secondary | ICD-10-CM | POA: Diagnosis not present

## 2018-06-09 DIAGNOSIS — N390 Urinary tract infection, site not specified: Secondary | ICD-10-CM | POA: Diagnosis not present

## 2018-06-09 DIAGNOSIS — R197 Diarrhea, unspecified: Secondary | ICD-10-CM | POA: Insufficient documentation

## 2018-06-09 DIAGNOSIS — R112 Nausea with vomiting, unspecified: Secondary | ICD-10-CM | POA: Diagnosis not present

## 2018-06-09 DIAGNOSIS — Z7982 Long term (current) use of aspirin: Secondary | ICD-10-CM | POA: Insufficient documentation

## 2018-06-09 DIAGNOSIS — Z79899 Other long term (current) drug therapy: Secondary | ICD-10-CM | POA: Insufficient documentation

## 2018-06-09 DIAGNOSIS — R109 Unspecified abdominal pain: Secondary | ICD-10-CM | POA: Diagnosis present

## 2018-06-09 DIAGNOSIS — Z87891 Personal history of nicotine dependence: Secondary | ICD-10-CM | POA: Diagnosis not present

## 2018-06-09 LAB — COMPREHENSIVE METABOLIC PANEL
ALBUMIN: 3.9 g/dL (ref 3.5–5.0)
ALK PHOS: 88 U/L (ref 38–126)
ALT: 24 U/L (ref 0–44)
ANION GAP: 8 (ref 5–15)
AST: 32 U/L (ref 15–41)
BILIRUBIN TOTAL: 0.4 mg/dL (ref 0.3–1.2)
BUN: 11 mg/dL (ref 6–20)
CALCIUM: 8.8 mg/dL — AB (ref 8.9–10.3)
CO2: 24 mmol/L (ref 22–32)
Chloride: 109 mmol/L (ref 98–111)
Creatinine, Ser: 0.78 mg/dL (ref 0.44–1.00)
GFR calc non Af Amer: >60 mL/min (ref >60)
GLUCOSE: 138 mg/dL — AB (ref 70–99)
Potassium: 3.5 mmol/L (ref 3.5–5.1)
SODIUM: 141 mmol/L (ref 135–145)
TOTAL PROTEIN: 7.1 g/dL (ref 6.5–8.1)

## 2018-06-09 LAB — CBC
HCT: 37.8 % (ref 36.0–46.0)
Hemoglobin: 12.4 g/dL (ref 12.0–15.0)
MCH: 30.5 pg (ref 26.0–34.0)
MCHC: 32.8 g/dL (ref 30.0–36.0)
MCV: 92.9 fL (ref 80.0–100.0)
NRBC: 0 % (ref 0.0–0.2)
PLATELETS: 239 10*3/uL (ref 150–400)
RBC: 4.07 MIL/uL (ref 3.87–5.11)
RDW: 13.2 % (ref 11.5–15.5)
WBC: 10 10*3/uL (ref 4.0–10.5)

## 2018-06-09 LAB — URINALYSIS, COMPLETE (UACMP) WITH MICROSCOPIC
BILIRUBIN URINE: NEGATIVE
Bacteria, UA: NONE SEEN
Glucose, UA: NEGATIVE mg/dL
KETONES UR: NEGATIVE mg/dL
Nitrite: NEGATIVE
PH: 5 (ref 5.0–8.0)
Protein, ur: NEGATIVE mg/dL
SPECIFIC GRAVITY, URINE: 1.018 (ref 1.005–1.030)

## 2018-06-09 LAB — LIPASE, BLOOD: Lipase: 31 U/L (ref 11–51)

## 2018-06-09 MED ORDER — PROMETHAZINE HCL 25 MG RE SUPP: 25.0000 mg | Freq: Four times a day (QID) | RECTAL | 1 refills | Status: DC | PRN

## 2018-06-09 MED ORDER — CEPHALEXIN 500 MG PO CAPS: 500.0000 mg | ORAL_CAPSULE | Freq: Three times a day (TID) | ORAL | 0 refills | Status: AC

## 2018-06-09 MED ORDER — ONDANSETRON HCL 4 MG/2ML IJ SOLN
4.0000 mg | Freq: Once | INTRAMUSCULAR | Status: AC
  Administered 2018-06-09: 4 mg via INTRAVENOUS
  Filled 2018-06-09: qty 2

## 2018-06-09 MED ORDER — SODIUM CHLORIDE 0.9 % IV BOLUS
1000.0000 mL | Freq: Once | INTRAVENOUS | Status: AC
  Administered 2018-06-09: 1000 mL via INTRAVENOUS

## 2018-06-09 MED ORDER — CEPHALEXIN 500 MG PO CAPS
500.0000 mg | ORAL_CAPSULE | Freq: Once | ORAL | Status: AC
  Administered 2018-06-09: 500 mg via ORAL
  Filled 2018-06-09: qty 1

## 2018-06-09 NOTE — ED Provider Notes (Addendum)
[facility] Emergency Department Provider Note  ____________________________________________   I have reviewed the triage vital signs and the nursing notes. Where available I have reviewed prior notes and, if possible and indicated, outside hospital notes.    HISTORY  Chief Complaint Abdominal Pain    HPI Sharon [name] is a 62 y.o. female well-known to this facility, patient has a chronic with diarrhea, she states she has it "all the time", she also has recurrent episodes of emesis and has for years.  Often for her distress related.  She been having her same symptoms this week.  She feels stronger this is not a Crohn's flare.  She has had no mucus stool or bloody stool.  She states that she has not thrown up today and her stool is getting better but she wants to be "checked out".  She does not have dysuria urinary frequency.  She denies chest pain or shortness of breath she has been eating and drinking well.  Last time she was here she was very concerned about her sleep, states her sleep is been better although she states she is again feeling somewhat anxious.  She denies any focal abdominal pain. Patient states she also needs a work note    Past Medical History:  Diagnosis Date  . Anxiety and depression   . Arthritis   . B12 deficiency    pernicious anemia - monthly b12 shots  . Cervical cancer ([place]) 12/2008   s/p hysterectomy  . Crohn's disease ([place]) 2000   h/o stenotic crohn's ileitis, active in TI s/p resections 2000, 2016 PPD neg ([facility] Dr. [name]); has been on cimzia, remicade, 6MP, entocort, now stable on Entyvio ([facility] [place])  . Genital warts   . GERD (gastroesophageal reflux disease)    severe, daily sxs if off PPI  . Hepatic steatosis 04/2011   diffuse on CT, 68m gallbladder polyp  . History of anemia    attributed to crohn's  . History of chicken pox   . History of syphilis 1980s  . HTN (hypertension)   . Migraines    and  frequent other headaches (sinus or stress)  . Scalp psoriasis   . Sensorineural hearing loss of both ears 12/2012   high freq  . Takotsubo syndrome 07/2012   cath 08/01/12, normal coronaries, LVEF 65%  . [facility]transient ischemic [facility]   [facility] (w/u negative - carotids, echo, TC doppler, and MRI normal)  . Tobacco abuse     Patient Active Problem List   Diagnosis Date Noted  . Chronic insomnia 03/27/2018  . Obesity, Class I, BMI 30.0-34.9 (see actual BMI) 01/24/2018  . Hematuria 01/01/2016  . Hyperglycemia 05/09/2013  . HLD (hyperlipidemia) 02/12/2013  . Takotsubo syndrome   . Chest pain 08/02/2012  . Bilateral hearing loss 06/27/2012  . GERD (gastroesophageal reflux disease)   . History of pneumonia 01/18/2012  . Headache 10/13/2011  . Healthcare maintenance 08/16/2011  . Leukopenia 08/16/2011  . Vitamin B12 deficiency   . HTN (hypertension)   . Migraines   . [place]   . Anxiety and depression   . Ex-smoker   . TIA (transient ischemic attack)   . Hepatic steatosis 04/30/2011    Past Surgical History:  Procedure Laterality Date  . abd UKorea 04/2011   diffuse hepatic steatosis, 538mpolyp in gallbaldder fundus, no stones, mod abd ath  . APPENDECTOMY  1998  . CARDIAC CATHETERIZATION  07/2012  normal LV fxn, widely patent coronaries  . CERVICAL BIOPSY  W/ LOOP ELECTRODE EXCISION  10/2008  . COLON RESECTION  10/2014   removal of scar tissue colon from prior surgery ([facility] at [facility])  . COLONOSCOPY  10/2008   ileo-colonic anastomosis, ielocolonic crohn's  . COLONOSCOPY  05/2011   ileo-colonic anastomosis normal, normal mucosa  . COLONOSCOPY  10/15/2012   focal inflammation at anastomosis, no active crohn's, planning MR enterograph [name])  . COLONOSCOPY  09/2014   [facility]  . ESOPHAGOGASTRODUODENOSCOPY  05/2011   nl esophagus, gastritis, nl duodenum  . hospitalization  05/2010   TIA vs complex migraine - w/u negative - head  CT, MRI/MRA, carotid dopplers, echo.  treated with ASA and tramadol  . KNEE SURGERY Left   . LEFT HEART CATHETERIZATION WITH CORONARY ANGIOGRAM N/A 08/01/2012   Procedure: LEFT HEART CATHETERIZATION WITH CORONARY ANGIOGRAM;  Surgeon: Sherren Mocha, MD;  Location: [facility] CATH LAB;  Service: Cardiovascular;  Laterality: N/A;  . RIGHT COLECTOMY  2000   crohn's disease, ileocecal resection  . SHOULDER ARTHROSCOPY W/ ROTATOR CUFF REPAIR Right 05/31/2012   [facility] Tamera Punt)  . US ECHOCARDIOGRAPHY  05/2010   nl LV fxn ,EF 60%  . VAGINAL HYSTERECTOMY  12/2008   LAVH/BSO    Prior to Admission medications   Medication Sig Start Date End Date Taking? Authorizing Provider  aspirin EC 81 MG tablet Take 1 tablet (81 mg total) by mouth daily. Over the counter 05/01/16   Rai, Vernelle Emerald, MD  butalbital-acetaminophen-caffeine (FIORICET, [facility]) [phone] MG tablet Take 1-2 tablets by mouth every 6 (six) hours as needed for headache. 04/14/18 04/14/19  Schaevitz, Randall An, MD  cyanocobalamin (,VITAMIN B-12,) 1000 MCG/ML injection Inject 1,000 mcg into the muscle every 30 (thirty) days.    [provider]  diphenhydrAMINE (BENADRYL) 25 mg capsule Take 12.5 mg by mouth at bedtime as needed for sleep.    [provider]  escitalopram (LEXAPRO) 20 MG tablet Take 1 tablet (20 mg total) by mouth daily. 02/18/18   Ria Bush, MD  LORazepam (ATIVAN) 0.5 MG tablet Take 1 tablet (0.5 mg total) by mouth at bedtime as needed. for sleep 05/23/18   Ria Bush, MD  metoprolol tartrate (LOPRESSOR) 50 MG tablet TAKE ONE TABLET BY MOUTH TWICE DAILY 06/13/17   Ria Bush, MD  ondansetron (ZOFRAN) 4 MG tablet Take 1 tablet (4 mg total) by mouth every 8 (eight) hours as needed for nausea or vomiting. Patient not taking: Reported on 05/23/2018 03/17/18   Schuyler Amor, MD  ondansetron (ZOFRAN-ODT) 8 MG disintegrating tablet Take 1 tablet (8 mg total) by mouth every 8 (eight) hours as needed  for nausea. 05/23/18   Elby Beck, FNP  traZODone (DESYREL) 50 MG tablet Take 0.5-1 tablets (25-50 mg total) by mouth at bedtime as needed for sleep. 03/27/18   Ria Bush, MD  vedolizumab (ENTYVIO) 300 MG injection Inject into the vein every 8 (eight) weeks.     [provider]    Allergies Infliximab; Codeine; Flagyl [metronidazole]; Hydrocodone; Ibuprofen; and Remeron [mirtazapine]  Family History  Problem Relation Age of Onset  . Crohn's disease Daughter        crohn's, ulcerative colitis  . Ulcerative colitis Daughter   . Stroke Mother   . Hypertension Mother   . Hyperlipidemia Mother   . Hypertension Father   . Crohn's disease Father   . Crohn's disease Sister   . Cancer Sister        ovarian  .  Crohn's disease Paternal Aunt   . Crohn's disease Paternal Uncle   . Diabetes Neg Hx     Social History Social History   Tobacco Use  . Smoking status: Former Smoker    Packs/day: 0.33    Years: 10.00    Pack years: 3.30    Types: Cigarettes    Last attempt to quit: 11/14/2016    Years since quitting: 1.5  . Smokeless tobacco: Never Used  . Tobacco comment: Quit within past yr  Substance Use Topics  . Alcohol use: Yes    Comment: occ  . Drug use: No    Comment: extensive drug use, remotely    Review of Systems Constitutional: No fever/chills Eyes: No visual changes. ENT: No sore throat. No stiff neck no neck pain Cardiovascular: Denies chest pain. Respiratory: Denies shortness of breath. Gastrointestinal:   HPI Genitourinary: Negative for dysuria. Musculoskeletal: Negative lower extremity swelling Skin: Negative for rash. Neurological: Negative for severe headaches, focal weakness or numbness.   ____________________________________________   PHYSICAL EXAM:  VITAL SIGNS: ED Triage Vitals  Enc Vitals Group     BP 06/09/18 1225 123/66     Pulse Rate 06/09/18 1225 (!) 56     Resp 06/09/18 1225 14     Temp 06/09/18 1225 98.2 F (36.8  C)     Temp Source 06/09/18 1225 Oral     SpO2 06/09/18 1225 97 %     Weight 06/09/18 1225 152 lb (68.9 kg)     Height 06/09/18 1225 5' 1.5" (1.562 m)     Head Circumference --      Peak Flow --      Pain Score 06/09/18 1229 8     Pain Loc --      Pain Edu? --      Excl. in [place]? --     Constitutional: Alert and oriented. Well appearing and in no acute distress. Eyes: Conjunctivae are normal Head: Atraumatic HEENT: No congestion/rhinnorhea. Mucous membranes are moist.  Oropharynx non-erythematous Neck:   Nontender with no meningismus, no masses, no stridor Cardiovascular: Normal rate, regular rhythm. Grossly normal heart sounds.  Good peripheral circulation. Respiratory: Normal respiratory effort.  No retractions. Lungs CTAB. Abdominal: Soft and nontender. No distention. No guarding no rebound Back:  There is no focal tenderness or step off.  there is no midline tenderness there are no lesions noted. there is no CVA tenderness Musculoskeletal: No lower extremity tenderness, no upper extremity tenderness. No joint effusions, no DVT signs strong distal pulses no edema Neurologic:  Normal speech and language. No gross focal neurologic deficits are appreciated.  Skin:  Skin is warm, dry and intact. No rash noted. Psychiatric: Mood and affect are normal. Speech and behavior are normal.  ____________________________________________   LABS (all labs ordered are listed, but only abnormal results are displayed)  Labs Reviewed  COMPREHENSIVE METABOLIC PANEL - Abnormal; Notable for the following components:      Result Value   Glucose, Bld 138 (*)    Calcium 8.8 (*)    All other components within normal limits  URINALYSIS, COMPLETE (UACMP) WITH MICROSCOPIC - Abnormal; Notable for the following components:   Color, Urine YELLOW (*)    APPearance HAZY (*)    Hgb urine dipstick SMALL (*)    Leukocytes, UA MODERATE (*)    All other components within normal limits  URINE CULTURE  LIPASE,  BLOOD  CBC    Pertinent labs  results that were available during my care of  the patient were reviewed by me and considered in my medical decision making (see chart for details). ____________________________________________  EKG   ____________________________________________  RADIOLOGY  Pertinent labs & imaging results that were available during my care of the patient were reviewed by me and considered in my medical decision making (see chart for details). If possible, patient and/or family made aware of any abnormal findings.  No results found. ____________________________________________    PROCEDURES  Procedure(s) performed: None  Procedures  Critical Care performed: None  ____________________________________________   INITIAL IMPRESSION / ASSESSMENT AND PLAN / ED COURSE  Pertinent labs & imaging results that were available during my care of the patient were reviewed by me and considered in my medical decision making (see chart for details).  Patient here with chronic vomiting and occasional diarrhea abdomen is completely benign blood work is completely reassuring do not see any indication for acute CT scan on this patient.  Patient herself does not want one.  She states she has had a many times for this and they never show anything.  Abdominal exams here in the department thus far returned no evidence of focal tenderness nothing to suggest diverticular disease appendicitis or other acute pathologies as gallbladder disease Crohn's flare with perforation or abscess.  Patient does appear to have a UTI which could be exacerbating her chronic symptoms, we are sending a urine culture and we will treat her for that.  We will give her IV fluid which seems to be her primary request and we will reassess.  ----------------------------------------- 6:53 PM on 06/09/2018 -----------------------------------------  Patient laughing and joking and texting on her cell phone she states she  feels much better.  She wants to go home at this time she declines further work-up.  She is very specific that she needs a work note for today.  We will of course get her one.  Serial abdominal exams are benign. Considering the patient's symptoms, medical history, and physical examination today, I have low suspicion for cholecystitis or biliary pathology, pancreatitis, perforation or bowel obstruction, hernia, intra-abdominal abscess, AAA or dissection, volvulus or intussusception, mesenteric ischemia, ischemic gut, pyelonephritis or appendicitis.  Is not vomited since she is been here and that is been several hours, she has not had any diarrhea since she is been here, and she is tolerating p.o. without difficulty.  We did ask for stool culture and she states she cannot provide one which I think is a positive prognostic indicator    ____________________________________________   FINAL CLINICAL IMPRESSION(S) / ED DIAGNOSES  Final diagnoses:  None      This chart was dictated using voice recognition software.  Despite best efforts to proofread,  errors can occur which can change meaning.      Schuyler Amor, MD 06/09/18 1715    Schuyler Amor, MD 06/09/18 [id number]    Schuyler Amor, MD 06/09/18 [phone]

## 2018-06-09 NOTE — ED Triage Notes (Signed)
Abdominal pain nausea vomiting and diarrhea x 1 week.

## 2018-06-11 LAB — URINE CULTURE

## 2018-06-25 ENCOUNTER — Other Ambulatory Visit: Payer: Self-pay | Admitting: Family Medicine

## 2018-06-25 NOTE — Telephone Encounter (Signed)
Name of Medication: Lorazepam Name of Pharmacy: [facility] or Written Date and Quantity: 05/24/18, #30 Last Office Visit and Type: 05/23/18, acute Next Office Visit and Type: none Last Controlled Substance Agreement Date: 05/01/15 Last UDS: 05/01/15

## 2018-06-26 ENCOUNTER — Ambulatory Visit: Payer: 59 | Admitting: Family Medicine

## 2018-06-26 NOTE — Telephone Encounter (Signed)
Eprescribed.

## 2018-07-04 ENCOUNTER — Other Ambulatory Visit: Payer: Self-pay | Admitting: Family Medicine

## 2018-07-04 DIAGNOSIS — I10 Essential (primary) hypertension: Secondary | ICD-10-CM

## 2018-07-24 ENCOUNTER — Other Ambulatory Visit: Payer: Self-pay

## 2018-07-24 ENCOUNTER — Emergency Department
Admission: EM | Admit: 2018-07-24 | Discharge: 2018-07-24 | Disposition: A | Discharge status: Home / Self Care | Payer: 59 | Attending: Emergency Medicine | Admitting: Emergency Medicine

## 2018-07-24 ENCOUNTER — Encounter: Payer: Self-pay | Admitting: Emergency Medicine

## 2018-07-24 DIAGNOSIS — Z8673 Personal history of transient ischemic attack (TIA), and cerebral infarction without residual deficits: Secondary | ICD-10-CM | POA: Diagnosis not present

## 2018-07-24 DIAGNOSIS — N390 Urinary tract infection, site not specified: Secondary | ICD-10-CM | POA: Diagnosis not present

## 2018-07-24 DIAGNOSIS — Z87891 Personal history of nicotine dependence: Secondary | ICD-10-CM | POA: Diagnosis not present

## 2018-07-24 DIAGNOSIS — I10 Essential (primary) hypertension: Secondary | ICD-10-CM | POA: Diagnosis not present

## 2018-07-24 DIAGNOSIS — Z7982 Long term (current) use of aspirin: Secondary | ICD-10-CM | POA: Insufficient documentation

## 2018-07-24 DIAGNOSIS — K5090 Crohn's disease, unspecified, without complications: Secondary | ICD-10-CM | POA: Diagnosis not present

## 2018-07-24 DIAGNOSIS — R42 Dizziness and giddiness: Secondary | ICD-10-CM | POA: Insufficient documentation

## 2018-07-24 DIAGNOSIS — R531 Weakness: Secondary | ICD-10-CM | POA: Diagnosis present

## 2018-07-24 DIAGNOSIS — Z79899 Other long term (current) drug therapy: Secondary | ICD-10-CM | POA: Insufficient documentation

## 2018-07-24 LAB — URINALYSIS, COMPLETE (UACMP) WITH MICROSCOPIC
BACTERIA UA: NONE SEEN
BILIRUBIN URINE: NEGATIVE
GLUCOSE, UA: NEGATIVE mg/dL
Ketones, ur: 20 mg/dL — AB
Nitrite: NEGATIVE
PH: 5 (ref 5.0–8.0)
Protein, ur: NEGATIVE mg/dL
SPECIFIC GRAVITY, URINE: 1.016 (ref 1.005–1.030)

## 2018-07-24 LAB — BASIC METABOLIC PANEL
Anion gap: 9 (ref 5–15)
BUN: 13 mg/dL (ref 6–20)
CHLORIDE: 108 mmol/L (ref 98–111)
CO2: 23 mmol/L (ref 22–32)
Calcium: 8.9 mg/dL (ref 8.9–10.3)
Creatinine, Ser: 0.7 mg/dL (ref 0.44–1.00)
GFR calc Af Amer: >60 mL/min (ref >60)
GFR calc non Af Amer: >60 mL/min (ref >60)
GLUCOSE: 93 mg/dL (ref 70–99)
POTASSIUM: 3.8 mmol/L (ref 3.5–5.1)
Sodium: 140 mmol/L (ref 135–145)

## 2018-07-24 LAB — CBC
HCT: 40.6 % (ref 36.0–46.0)
HEMOGLOBIN: 13.2 g/dL (ref 12.0–15.0)
MCH: 30.1 pg (ref 26.0–34.0)
MCHC: 32.5 g/dL (ref 30.0–36.0)
MCV: 92.5 fL (ref 80.0–100.0)
Platelets: 244 10*3/uL (ref 150–400)
RBC: 4.39 MIL/uL (ref 3.87–5.11)
RDW: 13 % (ref 11.5–15.5)
WBC: 10.1 10*3/uL (ref 4.0–10.5)
nRBC: 0 % (ref 0.0–0.2)

## 2018-07-24 LAB — GLUCOSE, CAPILLARY: Glucose-Capillary: 88 mg/dL (ref 70–99)

## 2018-07-24 MED ORDER — FOSFOMYCIN TROMETHAMINE 3 G PO PACK
3.0000 g | PACK | Freq: Once | ORAL | Status: AC
  Administered 2018-07-24: 3 g via ORAL
  Filled 2018-07-24 (×2): qty 3

## 2018-07-24 NOTE — ED Triage Notes (Signed)
Pt arrived with multiple complaints. Pt states she feels weak, shaky, and her left and right ear ache. PT reports symptoms started "a few days ago."

## 2018-07-24 NOTE — ED Provider Notes (Signed)
[facility] Emergency Department Provider Note  Time seen: 5:41 PM  I have reviewed the triage vital signs and the nursing notes.   HISTORY  Chief Complaint Weakness    HPI Sharon [name] is a 62 y.o. female with a past medical history of anxiety, Crohn's disease, gastric reflux, hypertension, TIA, presents to the emergency department for generalized weakness, dizziness ear pain.  According to the patient she woke this morning feeling somewhat weak and shaky, states she felt little dizzy.  States she has been experiencing pain in both of her ears times several weeks as well.  Denies any sore throat.  Denies any fever.  Patient denies any abdominal pain or chest pain, nausea vomiting or dysuria.  States frequent diarrhea but this is chronic for her.  Past Medical History:  Diagnosis Date  . Anxiety and depression   . Arthritis   . B12 deficiency    pernicious anemia - monthly b12 shots  . Cervical cancer ([place]) 12/2008   s/p hysterectomy  . Crohn's disease ([place]) 2000   h/o stenotic crohn's ileitis, active in TI s/p resections 2000, 2016 PPD neg ([facility] Dr. [name]); has been on cimzia, remicade, 6MP, entocort, now stable on Entyvio ([facility] [place])  . Genital warts   . GERD (gastroesophageal reflux disease)    severe, daily sxs if off PPI  . Hepatic steatosis 04/2011   diffuse on CT, 73m gallbladder polyp  . History of anemia    attributed to crohn's  . History of chicken pox   . History of syphilis 1980s  . HTN (hypertension)   . Migraines    and frequent other headaches (sinus or stress)  . Scalp psoriasis   . Sensorineural hearing loss of both ears 12/2012   high freq  . Takotsubo syndrome 07/2012   cath 08/01/12, normal coronaries, LVEF 65%  . [facility] ischemic attack[facility]   [facility] (w/u negative - carotids, echo, TC doppler, and MRI normal)  . Tobacco abuse     Patient Active Problem List   Diagnosis  Date Noted  . Chronic insomnia 03/27/2018  . Obesity, Class I, BMI 30.0-34.9 (see actual BMI) 01/24/2018  . Hematuria 01/01/2016  . Hyperglycemia 05/09/2013  . HLD (hyperlipidemia) 02/12/2013  . Takotsubo syndrome   . Chest pain 08/02/2012  . Bilateral hearing loss 06/27/2012  . GERD (gastroesophageal reflux disease)   . History of pneumonia 01/18/2012  . Headache 10/13/2011  . Healthcare maintenance 08/16/2011  . Leukopenia 08/16/2011  . Vitamin B12 deficiency   . HTN (hypertension)   . Migraines   . [place]   . Anxiety and depression   . Ex-smoker   . TIA (transient ischemic attack)   . Hepatic steatosis 04/30/2011    Past Surgical History:  Procedure Laterality Date  . abd UKorea 04/2011   diffuse hepatic steatosis, 51mpolyp in gallbaldder fundus, no stones, mod abd ath  . APPENDECTOMY  1998  . CARDIAC CATHETERIZATION  07/2012   normal LV fxn, widely patent coronaries  . CERVICAL BIOPSY  W/ LOOP ELECTRODE EXCISION  10/2008  . COLON RESECTION  10/2014   [facility] . COLONOSCOPY  10/2008   ileo-colonic anastomosis, ielocolonic crohn's  . COLONOSCOPY  05/2011   ileo-colonic anastomosis normal, normal mucosa  . COLONOSCOPY  10/15/2012   focal inflammation at anastomosis, no active crohn's, planning MR enterograph ([facility] .  [facility]COPY  09/2014   [facility]  . ESOPHAGOGASTRODUODENOSCOPY  05/2011   nl esophagus, gastritis, nl duodenum  . hospitalization  05/2010   TIA vs complex migraine - w/u negative - head CT, MRI/MRA, carotid dopplers, echo.  treated with ASA and tramadol  . KNEE SURGERY Left   . LEFT HEART CATHETERIZATION WITH CORONARY ANGIOGRAM N/A 08/01/2012   Procedure: LEFT HEART CATHETERIZATION WITH CORONARY ANGIOGRAM;  Surgeon: Sherren Mocha, MD;  Location: [facility] CATH LAB;  Service: Cardiovascular;  Laterality: N/A;  . RIGHT COLECTOMY  2000   crohn's disease, ileocecal resection  . SHOULDER  ARTHROSCOPY W/ ROTATOR CUFF REPAIR Right 05/31/2012   [facility] Tamera Punt)  . US ECHOCARDIOGRAPHY  05/2010   nl LV fxn ,EF 60%  . VAGINAL HYSTERECTOMY  12/2008   [facility]VH/BSO    Prior to Admission medications   Medication Sig Start Date End Date Taking? Authorizing Provider  aspirin EC 81 MG tablet Take 1 tablet (81 mg total) by mouth daily. Over the counter 05/01/16   Rai, Vernelle Emerald, MD  butalbital-acetaminophen-caffeine (FIORICET, [facility]) [phone] MG tablet Take 1-2 tablets by mouth every 6 (six) hours as needed for headache. 04/14/18 04/14/19  Schaevitz, Randall An, MD  cyanocobalamin (,VITAMIN B-12,) 1000 MCG/ML injection Inject 1,000 mcg into the muscle every 30 (thirty) days.    [provider]  diphenhydrAMINE (BENADRYL) 25 mg capsule Take 12.5 mg by mouth at bedtime as needed for sleep.    [provider]  escitalopram (LEXAPRO) 20 MG tablet Take 1 tablet (20 mg total) by mouth daily. 02/18/18   Ria Bush, MD  LORazepam (ATIVAN) 0.5 MG tablet TAKE 1 TABLET BY MOUTH AT BEDTIME AS NEEDED FOR SLEEP 06/26/18   Ria Bush, MD  metoprolol tartrate (LOPRESSOR) 50 MG tablet TAKE 1 TABLET BY MOUTH TWICE DAILY 07/04/18   Ria Bush, MD  ondansetron (ZOFRAN) 4 MG tablet Take 1 tablet (4 mg total) by mouth every 8 (eight) hours as needed for nausea or vomiting. Patient not taking: Reported on 05/23/2018 03/17/18   Schuyler Amor, MD  ondansetron (ZOFRAN-ODT) 8 MG disintegrating tablet Take 1 tablet (8 mg total) by mouth every 8 (eight) hours as needed for nausea. 05/23/18   Elby Beck, FNP  promethazine (PHENERGAN) 25 MG suppository Place 1 suppository (25 mg total) rectally every 6 (six) hours as needed for nausea. 06/09/18 06/09/19  Schuyler Amor, MD  traZODone (DESYREL) 50 MG tablet Take 0.5-1 tablets (25-50 mg total) by mouth at bedtime as needed for sleep. 03/27/18   Ria Bush, MD  vedolizumab (ENTYVIO) 300 MG injection Inject into the vein  every 8 (eight) weeks.     [provider]    Allergies  Allergen Reactions  . Infliximab Dermatitis  . Codeine Nausea And Vomiting  . Flagyl [Metronidazole] Nausea And Vomiting  . Hydrocodone Nausea And Vomiting  . Ibuprofen Other (See Comments)    Pt states that she is unable to take due to her Crohn's disease.  Marland Kitchen Remeron [Mirtazapine] Other (See Comments)    Reaction:  Makes pt lethargic     Family History  Problem Relation Age of Onset  . Crohn's disease Daughter        crohn's, ulcerative colitis  . Ulcerative colitis Daughter   . Stroke Mother   . Hypertension Mother   . Hyperlipidemia Mother   . Hypertension Father   . Crohn's disease Father   . Crohn's disease Sister   . Cancer Sister  ovarian  . Crohn's disease Paternal Aunt   . Crohn's disease Paternal Uncle   . Diabetes Neg Hx     Social History Social History   Tobacco Use  . Smoking status: Former Smoker    Packs/day: 0.33    Years: 10.00    Pack years: 3.30    Types: Cigarettes    Last attempt to quit: 11/14/2016    Years since quitting: 1.6  . Smokeless tobacco: Never Used  . Tobacco comment: Quit within past yr  Substance Use Topics  . Alcohol use: Yes    Comment: occ  . Drug use: No    Comment: extensive drug use, remotely    Review of Systems Constitutional: Negative for fever. Cardiovascular: Negative for chest pain. Respiratory: Negative for shortness of breath. Gastrointestinal: Negative for abdominal pain, vomiting.  Loose stool which is chronic. Genitourinary: Negative for urinary compaints Musculoskeletal: Negative for musculoskeletal complaints Skin: Negative for skin complaints  Neurological: Negative for headache All other ROS negative  ____________________________________________   PHYSICAL EXAM:  VITAL SIGNS: ED Triage Vitals [07/24/18 1436]  Enc Vitals Group     BP (!) 119/54     Pulse Rate (!) 53     Resp 18     Temp 98.2 F (36.8 C)     Temp  Source Oral     SpO2 98 %     Weight 152 lb (68.9 kg)     Height 5' 1.5" (1.562 m)     Head Circumference      Peak Flow      Pain Score 7     Pain Loc      Pain Edu?      Excl. in [place]?    Constitutional: Alert and oriented. Well appearing and in no distress. Eyes: Normal exam ENT   Head: Normocephalic and atraumatic.   Mouth/Throat: Mucous membranes are moist. Cardiovascular: Normal rate, regular rhythm. No murmur Respiratory: Normal respiratory effort without tachypnea nor retractions. Breath sounds are clear  Gastrointestinal: Soft and nontender. No distention. Musculoskeletal: Nontender with normal range of motion in all extremities. Neurologic:  Normal speech and language. No gross focal neurologic deficits  Skin:  Skin is warm, dry and intact.  Psychiatric: Mood and affect are normal. Speech and behavior are normal.   ____________________________________________  EKG reviewed and interpreted by myself shows a sinus bradycardia 53 bpm with a narrow QRS, normal axis, normal intervals, no concerning ST changes.   INITIAL IMPRESSION / ASSESSMENT AND PLAN / ED COURSE  Pertinent labs & imaging results that were available during my care of the patient were reviewed by me and considered in my medical decision making (see chart for details).  Patient presents emergency department for generalized weakness, dizziness since awakening this morning.  Patient's vitals are reassuring, exam is reassuring.  Patient's lab work is largely normal besides a urinary tract infection showing moderate leukocyte esterase as well as 11-20 red blood cells.  We will treat with a one-time dose of fosfomycin and send a urine culture.  Lab work is otherwise nonrevealing.  With reassuring vitals and as the patient appears well overall we will discharge home with PCP follow-up.  Patient agreeable to plan of care.  ____________________________________________   FINAL CLINICAL IMPRESSION(S) / ED  DIAGNOSES  Weakness Urinary tract infection    Harvest Dark, MD 07/24/18 1744

## 2018-07-25 ENCOUNTER — Ambulatory Visit: Payer: 59 | Admitting: Family Medicine

## 2018-07-25 DIAGNOSIS — Z0289 Encounter for other administrative examinations: Secondary | ICD-10-CM

## 2018-07-25 NOTE — Progress Notes (Deleted)
There were no vitals taken for this visit.   CC: ER f/u visit Subjective:    Patient ID: Sharon [name], female    DOB: [birth year], 62 y.o.   MRN: [id number]  HPI: Sharon [name] is a 62 y.o. female presenting on 07/25/2018 for No chief complaint on file.   ***  Relevant past medical, surgical, family and social history reviewed and updated as indicated. Interim medical history since our last visit reviewed. Allergies and medications reviewed and updated. Outpatient Medications Prior to Visit  Medication Sig Dispense Refill  . aspirin EC 81 MG tablet Take 1 tablet (81 mg total) by mouth daily. Over the counter 30 tablet 3  . butalbital-acetaminophen-caffeine (FIORICET, ESGIC) 50-325-40 MG tablet Take 1-2 tablets by mouth every 6 (six) hours as needed for headache. 20 tablet 0  . cyanocobalamin (,VITAMIN B-12,) 1000 MCG/ML injection Inject 1,000 mcg into the muscle every 30 (thirty) days.    . diphenhydrAMINE (BENADRYL) 25 mg capsule Take 12.5 mg by mouth at bedtime as needed for sleep.    Marland Kitchen escitalopram (LEXAPRO) 20 MG tablet Take 1 tablet (20 mg total) by mouth daily. 30 tablet 6  . LORazepam (ATIVAN) 0.5 MG tablet TAKE 1 TABLET BY MOUTH AT BEDTIME AS NEEDED FOR SLEEP 30 tablet 0  . metoprolol tartrate (LOPRESSOR) 50 MG tablet TAKE 1 TABLET BY MOUTH TWICE DAILY 180 tablet 1  . ondansetron (ZOFRAN) 4 MG tablet Take 1 tablet (4 mg total) by mouth every 8 (eight) hours as needed for nausea or vomiting. (Patient not taking: Reported on 05/23/2018) 8 tablet 0  . ondansetron (ZOFRAN-ODT) 8 MG disintegrating tablet Take 1 tablet (8 mg total) by mouth every 8 (eight) hours as needed for nausea. 20 tablet 0  . promethazine (PHENERGAN) 25 MG suppository Place 1 suppository (25 mg total) rectally every 6 (six) hours as needed for nausea. 12 suppository 1  . traZODone (DESYREL) 50 MG tablet Take 0.5-1 tablets (25-50 mg total) by mouth at bedtime as needed for sleep. 30 tablet 3  . vedolizumab  (ENTYVIO) 300 MG injection Inject into the vein every 8 (eight) weeks.      No facility-administered medications prior to visit.      Per HPI unless specifically indicated in ROS section below Review of Systems     Objective:    There were no vitals taken for this visit.  Wt Readings from Last 3 Encounters:  07/24/18 152 lb (68.9 kg)  06/09/18 152 lb (68.9 kg)  05/23/18 155 lb (70.3 kg)    Physical Exam Results for orders placed or performed during the hospital encounter of 94/76/54  Basic metabolic panel  Result Value Ref Range   Sodium 140 135 - 145 mmol/L   Potassium 3.8 3.5 - 5.1 mmol/L   Chloride 108 98 - 111 mmol/L   CO2 23 22 - 32 mmol/L   Glucose, Bld 93 70 - 99 mg/dL   BUN 13 6 - 20 mg/dL   Creatinine, Ser 0.70 0.44 - 1.00 mg/dL   Calcium 8.9 8.9 - 10.3 mg/dL   GFR calc non Af Amer >60 >60 mL/min   GFR calc Af Amer >60 >60 mL/min   Anion gap 9 5 - 15  CBC  Result Value Ref Range   WBC 10.1 4.0 - 10.5 K/uL   RBC 4.39 3.87 - 5.11 MIL/uL   Hemoglobin 13.2 12.0 - 15.0 g/dL   HCT 40.6 36.0 - 46.0 %   MCV 92.5 80.0 - 100.0  fL   MCH 30.1 26.0 - 34.0 pg   MCHC 32.5 30.0 - 36.0 g/dL   RDW 13.0 11.5 - 15.5 %   Platelets 244 150 - 400 K/uL   nRBC 0.0 0.0 - 0.2 %  Urinalysis, Complete w Microscopic  Result Value Ref Range   Color, Urine YELLOW (A) YELLOW   APPearance CLEAR (A) CLEAR   Specific Gravity, Urine 1.016 1.005 - 1.030   pH 5.0 5.0 - 8.0   Glucose, UA NEGATIVE NEGATIVE mg/dL   Hgb urine dipstick SMALL (A) NEGATIVE   Bilirubin Urine NEGATIVE NEGATIVE   Ketones, ur 20 (A) NEGATIVE mg/dL   Protein, ur NEGATIVE NEGATIVE mg/dL   Nitrite NEGATIVE NEGATIVE   Leukocytes, UA MODERATE (A) NEGATIVE   RBC / HPF 11-20 0 - 5 RBC/hpf   WBC, UA 6-10 0 - 5 WBC/hpf   Bacteria, UA NONE SEEN NONE SEEN   Squamous Epithelial / LPF 0-5 0 - 5   Mucus PRESENT   Glucose, capillary  Result Value Ref Range   Glucose-Capillary 88 70 - 99 mg/dL      Assessment & Plan:    Problem List Items Addressed This Visit    None       No orders of the defined types were placed in this encounter.  No orders of the defined types were placed in this encounter.   Follow up plan: No follow-ups on file.  Ria Bush, MD

## 2018-07-26 LAB — URINE CULTURE

## 2018-07-27 ENCOUNTER — Other Ambulatory Visit: Payer: Self-pay | Admitting: Family Medicine

## 2018-07-29 NOTE — Telephone Encounter (Signed)
Eprescribed.

## 2018-07-30 ENCOUNTER — Telehealth: Payer: Self-pay | Admitting: *Deleted

## 2018-07-30 DIAGNOSIS — Z2839 Other underimmunization status: Secondary | ICD-10-CM

## 2018-07-30 DIAGNOSIS — Z9189 Other specified personal risk factors, not elsewhere classified: Principal | ICD-10-CM

## 2018-07-30 NOTE — Telephone Encounter (Signed)
Spoke to pt who states she has gotten a new job and is requesting titers for MMR and varicella. She is needing the results back by Friday and is requesting a cb once orders have been placed so that she can schedule a lab visit. pls advise

## 2018-07-30 NOTE — Telephone Encounter (Signed)
Labs ordered.

## 2018-07-31 NOTE — Telephone Encounter (Signed)
Left message on vm for pt to call back.    Needs to schedule lab visit for titers.

## 2018-07-31 NOTE — Telephone Encounter (Signed)
Left message on vm for pt to call back.  Needs to schedule lab visit for titers.

## 2018-08-01 NOTE — Telephone Encounter (Signed)
Noted  

## 2018-08-01 NOTE — Telephone Encounter (Signed)
Spoke to pt who states she had titers completed at [facility] where she will be employed. States she is needing copy of ppd; printed and placed at the front desk for pickup

## 2018-08-02 DIAGNOSIS — K5080 Crohn's disease of both small and large intestine without complications: Secondary | ICD-10-CM | POA: Diagnosis not present

## 2018-08-03 NOTE — Telephone Encounter (Signed)
Pt came to office to pick up results of TB skin test done 03/27/18. Given and Sharon [name] had pt sign record release.

## 2018-08-28 ENCOUNTER — Other Ambulatory Visit: Payer: Self-pay | Admitting: Family Medicine

## 2018-08-30 NOTE — Telephone Encounter (Signed)
Name of Medication: ativan Name of Pharmacy: [facility] Last Fill or Written Date and Quantity: 07/29/18 #30 tabs with 0 refills Last Office Visit and Type:  Migraine with [facility] Netters on 05/23/18 (no-showed or cancelled other appt with PCP) Next Office Visit and Type: no future appts  Last Controlled Substance Agreement Date: 05/01/15 Last UDS:05/01/15

## 2018-08-31 NOTE — Telephone Encounter (Signed)
Eprescribed.

## 2018-09-11 ENCOUNTER — Other Ambulatory Visit: Payer: Self-pay | Admitting: Family Medicine

## 2018-10-03 ENCOUNTER — Other Ambulatory Visit: Payer: Self-pay | Admitting: Family Medicine

## 2018-10-08 ENCOUNTER — Other Ambulatory Visit: Payer: Self-pay | Admitting: Family Medicine

## 2018-10-11 ENCOUNTER — Other Ambulatory Visit: Payer: Self-pay | Admitting: *Deleted

## 2018-10-11 NOTE — Telephone Encounter (Signed)
Spoke to pt who is requesting medication refill or lorazepam. Last 2 request on 2/5 and 2/10 were denied and pt states she is aware she is requiring an OV. She states she has gotten a new job and Dr Danise Mina is no longer in network. She has an upcoming establish appt at [facility] but its not scheduled until [facility] March. Pt is requesting a refill until she is able to be seen by her new provider. Last OV 02/2018. pls advise

## 2018-10-12 MED ORDER — LORAZEPAM 0.5 MG PO TABS: 0.5000 mg | ORAL_TABLET | Freq: Every evening | ORAL | 0 refills | Status: DC | PRN

## 2018-10-12 NOTE — Telephone Encounter (Signed)
Noted. Thanks.

## 2018-10-12 NOTE — Telephone Encounter (Signed)
Eprescribed.

## 2018-11-14 ENCOUNTER — Ambulatory Visit: Payer: Self-pay | Admitting: Family Medicine

## 2018-11-14 ENCOUNTER — Emergency Department
Admission: EM | Admit: 2018-11-14 | Discharge: 2018-11-14 | Disposition: A | Discharge status: Home / Self Care | Payer: 59 | Attending: Emergency Medicine | Admitting: Emergency Medicine

## 2018-11-14 ENCOUNTER — Other Ambulatory Visit: Payer: Self-pay

## 2018-11-14 DIAGNOSIS — Z8541 Personal history of malignant neoplasm of cervix uteri: Secondary | ICD-10-CM | POA: Insufficient documentation

## 2018-11-14 DIAGNOSIS — K5090 Crohn's disease, unspecified, without complications: Secondary | ICD-10-CM | POA: Insufficient documentation

## 2018-11-14 DIAGNOSIS — Z8673 Personal history of transient ischemic attack (TIA), and cerebral infarction without residual deficits: Secondary | ICD-10-CM | POA: Insufficient documentation

## 2018-11-14 DIAGNOSIS — R51 Headache: Secondary | ICD-10-CM | POA: Insufficient documentation

## 2018-11-14 DIAGNOSIS — R1084 Generalized abdominal pain: Secondary | ICD-10-CM | POA: Diagnosis not present

## 2018-11-14 DIAGNOSIS — I10 Essential (primary) hypertension: Secondary | ICD-10-CM | POA: Insufficient documentation

## 2018-11-14 DIAGNOSIS — R197 Diarrhea, unspecified: Secondary | ICD-10-CM | POA: Insufficient documentation

## 2018-11-14 DIAGNOSIS — Z79899 Other long term (current) drug therapy: Secondary | ICD-10-CM | POA: Insufficient documentation

## 2018-11-14 DIAGNOSIS — R112 Nausea with vomiting, unspecified: Secondary | ICD-10-CM | POA: Diagnosis not present

## 2018-11-14 DIAGNOSIS — R5383 Other fatigue: Secondary | ICD-10-CM | POA: Diagnosis not present

## 2018-11-14 DIAGNOSIS — Z87891 Personal history of nicotine dependence: Secondary | ICD-10-CM | POA: Diagnosis not present

## 2018-11-14 DIAGNOSIS — Z7982 Long term (current) use of aspirin: Secondary | ICD-10-CM | POA: Diagnosis not present

## 2018-11-14 LAB — CBC
HCT: 38.4 % (ref 36.0–46.0)
Hemoglobin: 12.2 g/dL (ref 12.0–15.0)
MCH: 30.2 pg (ref 26.0–34.0)
MCHC: 31.8 g/dL (ref 30.0–36.0)
MCV: 95 fL (ref 80.0–100.0)
Platelets: 235 10*3/uL (ref 150–400)
RBC: 4.04 MIL/uL (ref 3.87–5.11)
RDW: 13.9 % (ref 11.5–15.5)
WBC: 10.4 10*3/uL (ref 4.0–10.5)
nRBC: 0 % (ref 0.0–0.2)

## 2018-11-14 LAB — URINALYSIS, COMPLETE (UACMP) WITH MICROSCOPIC
BILIRUBIN URINE: NEGATIVE
Bacteria, UA: NONE SEEN
Glucose, UA: NEGATIVE mg/dL
Ketones, ur: NEGATIVE mg/dL
Nitrite: NEGATIVE
Protein, ur: NEGATIVE mg/dL
Specific Gravity, Urine: 1.011 (ref 1.005–1.030)
pH: 5 (ref 5.0–8.0)

## 2018-11-14 LAB — COMPREHENSIVE METABOLIC PANEL
ALT: 21 U/L (ref 0–44)
AST: 25 U/L (ref 15–41)
Albumin: 3.9 g/dL (ref 3.5–5.0)
Alkaline Phosphatase: 91 U/L (ref 38–126)
Anion gap: 7 (ref 5–15)
BUN: 14 mg/dL (ref 6–20)
CO2: 26 mmol/L (ref 22–32)
Calcium: 9 mg/dL (ref 8.9–10.3)
Chloride: 107 mmol/L (ref 98–111)
Creatinine, Ser: 0.78 mg/dL (ref 0.44–1.00)
GFR calc Af Amer: >60 mL/min (ref >60)
GFR calc non Af Amer: >60 mL/min (ref >60)
Glucose, Bld: 97 mg/dL (ref 70–99)
Potassium: 4.1 mmol/L (ref 3.5–5.1)
Sodium: 140 mmol/L (ref 135–145)
Total Bilirubin: 0.2 mg/dL — ABNORMAL LOW (ref 0.3–1.2)
Total Protein: 7.5 g/dL (ref 6.5–8.1)

## 2018-11-14 LAB — LIPASE, BLOOD: LIPASE: 44 U/L (ref 11–51)

## 2018-11-14 LAB — POCT PREGNANCY, URINE: Preg Test, Ur: NEGATIVE

## 2018-11-14 MED ORDER — MORPHINE SULFATE (PF) 4 MG/ML IV SOLN
4.0000 mg | Freq: Once | INTRAVENOUS | Status: AC
  Administered 2018-11-14: 4 mg via INTRAVENOUS
  Filled 2018-11-14: qty 1

## 2018-11-14 MED ORDER — ONDANSETRON HCL 4 MG/2ML IJ SOLN
4.0000 mg | Freq: Once | INTRAMUSCULAR | Status: AC
  Administered 2018-11-14: 4 mg via INTRAVENOUS
  Filled 2018-11-14: qty 2

## 2018-11-14 MED ORDER — PREDNISONE 20 MG PO TABS: 60.0000 mg | ORAL_TABLET | Freq: Every day | ORAL | 0 refills | Status: AC

## 2018-11-14 MED ORDER — METHYLPREDNISOLONE SODIUM SUCC 125 MG IJ SOLR
125.0000 mg | Freq: Once | INTRAMUSCULAR | Status: AC
  Administered 2018-11-14: 125 mg via INTRAVENOUS
  Filled 2018-11-14: qty 2

## 2018-11-14 MED ORDER — SODIUM CHLORIDE 0.9 % IV BOLUS
1000.0000 mL | Freq: Once | INTRAVENOUS | Status: AC
  Administered 2018-11-14: 1000 mL via INTRAVENOUS

## 2018-11-14 MED ORDER — ONDANSETRON 4 MG PO TBDP: 4.0000 mg | ORAL_TABLET | Freq: Three times a day (TID) | ORAL | 0 refills | Status: DC | PRN

## 2018-11-14 NOTE — Discharge Instructions (Signed)
Take prednisone as prescribed for possible Crohn's flare.  Take Zofran as needed for nausea.  Eat a bland diet for the next 48 hours.  Drink plenty of fluids.  Follow-up with your doctor.  Return to the emergency room for new or worsening abdominal pain or fever.

## 2018-11-14 NOTE — ED Provider Notes (Signed)
[facility] Emergency Department Provider Note  ____________________________________________  Time seen: Approximately 9:37 AM  I have reviewed the triage vital signs and the nursing notes.   HISTORY  Chief Complaint Abdominal Pain   HPI Sharon [name] is a 63 y.o. female with a history of Crohn's disease status post right colectomy in 2000, anemia, hypertension, Takotsubo cardiomyopathy who presents for evaluation of abdominal pain.  Patient reports a week of generalized weakness, fatigue, initially intermittent sharp abdominal pain which is now constant for the last few days, headache, nausea, intermittent episodes of nonbloody nonbilious emesis, and several daily episodes of diarrhea.  No melena, hematemesis, hematochezia, coffee-ground emesis.  No fever, no cough, no chest pain or shortness of breath.  Currently her abdominal pain is 7 out of 10 diffuse and nonradiating.  Patient is supposed to be on Entyvio injections every 8 weeks but has not received any since November due to issues with insurance switch and lack of PCP.  Patient feels like her symptoms are due to a Crohn's flare.  She is not on steroids.  Past Medical History:  Diagnosis Date  . Anxiety and depression   . Arthritis   . B12 deficiency    pernicious anemia - monthly b12 shots  . Cervical cancer ([place]) 12/2008   s/p hysterectomy  . Crohn's disease ([place]) 2000   h/o stenotic crohn's ileitis, active in TI s/p resections 2000, 2016 PPD neg ([facility] Dr. [name]); has been on cimzia, remicade, 6MP, entocort, now stable on Entyvio ([facility] [place])  . Genital warts   . GERD (gastroesophageal reflux disease)    severe, daily sxs if off PPI  . Hepatic steatosis 04/2011   diffuse on CT, 62m gallbladder polyp  . History of anemia    attributed to crohn's  . History of chicken pox   . History of syphilis 1980s  . HTN (hypertension)   . Migraines    and frequent other headaches (sinus or  stress)  . Scalp psoriasis   . Sensorineural hearing loss of both ears 12/2012   high freq  . Takotsubo syndrome 07/2012   cath 08/01/12, normal coronaries, LVEF 65%  . TIA (transient ischemic attack) 05/2010   [facility] vs complex migraine (w/u negative - carotids, echo, TC doppler, and MRI normal)  . Tobacco abuse     Patient Active Problem List   Diagnosis Date Noted  . Chronic insomnia 03/27/2018  . Obesity, Class I, BMI 30.0-34.9 (see actual BMI) 01/24/2018  . Hematuria 01/01/2016  . Hyperglycemia 05/09/2013  . HLD (hyperlipidemia) 02/12/2013  . Takotsubo syndrome   . Chest pain 08/02/2012  . Bilateral hearing loss 06/27/2012  . GERD (gastroesophageal reflux disease)   . History of pneumonia 01/18/2012  . Headache 10/13/2011  . Healthcare maintenance 08/16/2011  . Leukopenia 08/16/2011  . Vitamin B12 deficiency   . HTN (hypertension)   . Migraines   . [place]   . Anxiety and depression   . Ex-smoker   . TIA (transient ischemic attack)   . Hepatic steatosis 04/30/2011    Past Surgical History:  Procedure Laterality Date  . abd [place] 04/2011   diffuse hepatic steatosis, 562mpolyp in gallbaldder fundus, no stones, mod abd ath  . APPENDECTOMY  1998  . CARDIAC CATHETERIZATION  07/2012   normal LV fxn, widely patent coronaries  . CERVICAL BIOPSY  W/ LOOP ELECTRODE EXCISION  10/2008  . COLON RESECTION  10/2014   removal  of scar tissue colon from prior surgery ([facility])  . COLONOSCOPY  10/2008   ileo-colonic anastomosis, ielocolonic crohn's  . COLONOSCOPY  05/2011   ileo-colonic anastomosis normal, normal mucosa  . COLONOSCOPY  10/15/2012   focal inflammation at anastomosis, no active crohn's, planning MR enterograph [name])  . COLONOSCOPY  09/2014   [facility]  . ESOPHAGOGASTRODUODENOSCOPY  05/2011   nl esophagus, gastritis, nl duodenum  . hospitalization  05/2010   TIA vs complex migraine - w/u negative - head CT, MRI/MRA, carotid dopplers, echo.   treated with ASA and tramadol  . KNEE SURGERY Left   . LEFT HEART CATHETERIZATION WITH CORONARY ANGIOGRAM N/A 08/01/2012   Procedure: LEFT HEART CATHETERIZATION WITH CORONARY ANGIOGRAM;  Surgeon: Sherren Mocha, MD;  Location: [facility] CATH LAB;  Service: Cardiovascular;  Laterality: N/A;  . RIGHT COLECTOMY  2000   crohn's disease, ileocecal resection  . SHOULDER ARTHROSCOPY W/ ROTATOR CUFF REPAIR Right 05/31/2012   [facility] Tamera Punt)  . US ECHOCARDIOGRAPHY  05/2010   nl LV fxn ,EF 60%  . VAGINAL HYSTERECTOMY  12/2008   [facility]/BSO    Prior to Admission medications   Medication Sig Start Date End Date Taking? Authorizing Provider  aspirin EC 81 MG tablet Take 1 tablet (81 mg total) by mouth daily. Over the counter 05/01/16   Rai, Vernelle Emerald, MD  butalbital-acetaminophen-caffeine (FIORICET, [facility]) [phone] MG tablet Take 1-2 tablets by mouth every 6 (six) hours as needed for headache. 04/14/18 04/14/19  Schaevitz, Randall An, MD  cyanocobalamin (,VITAMIN B-12,) 1000 MCG/ML injection Inject 1,000 mcg into the muscle every 30 (thirty) days.    [provider]  diphenhydrAMINE (BENADRYL) 25 mg capsule Take 12.5 mg by mouth at bedtime as needed for sleep.    [provider]  escitalopram (LEXAPRO) 20 MG tablet TAKE 1 TABLET BY MOUTH ONCE DAILY 09/11/18   Ria Bush, MD  LORazepam (ATIVAN) 0.5 MG tablet Take 1 tablet (0.5 mg total) by mouth at bedtime as needed. for sleep 10/12/18   Ria Bush, MD  metoprolol tartrate (LOPRESSOR) 50 MG tablet TAKE 1 TABLET BY MOUTH TWICE DAILY 07/04/18   Ria Bush, MD  ondansetron (ZOFRAN ODT) 4 MG disintegrating tablet Take 1 tablet (4 mg total) by mouth every 8 (eight) hours as needed. 11/14/18   Rudene Re, MD  ondansetron (ZOFRAN) 4 MG tablet Take 1 tablet (4 mg total) by mouth every 8 (eight) hours as needed for nausea or vomiting. Patient not taking: Reported on 05/23/2018 03/17/18   Schuyler Amor, MD  predniSONE  (DELTASONE) 20 MG tablet Take 3 tablets (60 mg total) by mouth daily for 4 days. 11/14/18 11/18/18  Rudene Re, MD  promethazine (PHENERGAN) 25 MG suppository Place 1 suppository (25 mg total) rectally every 6 (six) hours as needed for nausea. 06/09/18 06/09/19  Schuyler Amor, MD  traZODone (DESYREL) 50 MG tablet Take 0.5-1 tablets (25-50 mg total) by mouth at bedtime as needed for sleep. 03/27/18   Ria Bush, MD  vedolizumab (ENTYVIO) 300 MG injection Inject into the vein every 8 (eight) weeks.     [provider]    Allergies Infliximab; Codeine; Flagyl [metronidazole]; Hydrocodone; Ibuprofen; and Remeron [mirtazapine]  Family History  Problem Relation Age of Onset  . Crohn's disease Daughter        crohn's, ulcerative colitis  . Ulcerative colitis Daughter   . Stroke Mother   . Hypertension Mother   . Hyperlipidemia Mother   . Hypertension Father   .  Crohn's disease Father   . Crohn's disease Sister   . Cancer Sister        ovarian  . Crohn's disease Paternal Aunt   . Crohn's disease Paternal Uncle   . Diabetes Neg Hx     Social History Social History   Tobacco Use  . Smoking status: Former Smoker    Packs/day: 0.33    Years: 10.00    Pack years: 3.30    Types: Cigarettes    Last attempt to quit: 11/14/2016    Years since quitting: 2.0  . Smokeless tobacco: Never Used  . Tobacco comment: Quit within past yr  Substance Use Topics  . Alcohol use: Yes    Comment: occ  . Drug use: No    Comment: extensive drug use, remotely    Review of Systems  Constitutional: Negative for fever. + Fatigue and generalized weakness Eyes: Negative for visual changes. ENT: Negative for sore throat. Neck: No neck pain  Cardiovascular: Negative for chest pain. Respiratory: Negative for shortness of breath. Gastrointestinal: + abdominal pain, vomiting and diarrhea. Genitourinary: Negative for dysuria. Musculoskeletal: Negative for back pain. Skin: Negative  for rash. Neurological: Negative for weakness or numbness. + HA Psych: No SI or HI  ____________________________________________   PHYSICAL EXAM:  VITAL SIGNS: ED Triage Vitals  Enc Vitals Group     BP 11/14/18 0912 114/63     Pulse Rate 11/14/18 0912 (!) 55     Resp 11/14/18 0912 16     Temp 11/14/18 0912 98.1 F (36.7 C)     Temp Source 11/14/18 0912 Oral     SpO2 11/14/18 0912 99 %     Weight 11/14/18 0915 150 lb (68 kg)     Height 11/14/18 0915 5' 1"  (1.549 m)     Head Circumference --      Peak Flow --      Pain Score 11/14/18 0915 7     Pain Loc --      Pain Edu? --      Excl. in [place]? --     Constitutional: Alert and oriented. Well appearing and in no apparent distress. HEENT:      Head: Normocephalic and atraumatic.         Eyes: Conjunctivae are normal. Sclera is non-icteric.       Mouth/Throat: Mucous membranes are moist.       Neck: Supple with no signs of meningismus. Cardiovascular: Regular rate and rhythm. No murmurs, gallops, or rubs. 2+ symmetrical distal pulses are present in all extremities. No JVD. Respiratory: Normal respiratory effort. Lungs are clear to auscultation bilaterally. No wheezes, crackles, or rhonchi.  Gastrointestinal: Soft, diffuse mild tenderness to palpation, and non distended with positive bowel sounds. No rebound or guarding. Genitourinary: No CVA tenderness. Musculoskeletal: Nontender with normal range of motion in all extremities. No edema, cyanosis, or erythema of extremities. Neurologic: Normal speech and language. Face is symmetric. Moving all extremities. No gross focal neurologic deficits are appreciated. Skin: Skin is warm, dry and intact. No rash noted. Psychiatric: Mood and affect are normal. Speech and behavior are normal.  ____________________________________________   LABS (all labs ordered are listed, but only abnormal results are displayed)  Labs Reviewed  COMPREHENSIVE METABOLIC PANEL - Abnormal; Notable for the  following components:      Result Value   Total Bilirubin 0.2 (*)    All other components within normal limits  URINALYSIS, COMPLETE (UACMP) WITH MICROSCOPIC - Abnormal; Notable for the following components:  Color, Urine YELLOW (*)    APPearance CLEAR (*)    Hgb urine dipstick SMALL (*)    Leukocytes,Ua TRACE (*)    All other components within normal limits  LIPASE, BLOOD  CBC  POC URINE PREG, ED  POCT PREGNANCY, URINE   ____________________________________________  EKG  none  ____________________________________________  RADIOLOGY  none  ____________________________________________   PROCEDURES  Procedure(s) performed: None Procedures Critical Care performed:  None ____________________________________________   INITIAL IMPRESSION / ASSESSMENT AND PLAN / ED COURSE   63 y.o. female with a history of Crohn's disease status post right colectomy in 2000, anemia, hypertension, Takotsubo cardiomyopathy who presents for evaluation of abdominal pain, vomiting, diarrhea, headache, weakness, fatigue x1 week.  Patient is well-appearing and in no distress, has normal vital signs, abdomen is soft with no distention, mild generalized tenderness with no localized tenderness, rebound or guarding.  Differential diagnosis including viral illness versus Crohn's flare versus colitis versus diverticulitis.  Will start patient on steroids for possible Crohn's flare, give IV fluids, Zofran and morphine for symptom relief.  Will check labs and urinalysis.  Clinical Course as of Nov 13 1052  Wed Nov 14, 2018  1051 Labs showing no acute findings.  Discussed risks and benefits of CT imaging at this time, and with normal labs, normal vitals I really do not believe patient warrants a CT scan.  I will send her home on prednisone for Crohn's flare.  Recommended close follow-up with primary care doctor.  She has an appointment with her new doctor coming up next week.  Discussed and return precautions.    [CV]    Clinical Course User Index [CV] Alfred Levins [place], MD     As part of my medical decision making, I reviewed the following data within the [place] notes reviewed and incorporated, Labs reviewed , Old chart reviewed, Notes from prior ED visits and [place] Controlled Substance Database    Pertinent labs & imaging results that were available during my care of the patient were reviewed by me and considered in my medical decision making (see chart for details).    ____________________________________________   FINAL CLINICAL IMPRESSION(S) / ED DIAGNOSES  Final diagnoses:  Generalized abdominal pain  Nausea vomiting and diarrhea      NEW MEDICATIONS STARTED DURING THIS VISIT:  ED Discharge Orders         Ordered    ondansetron (ZOFRAN ODT) 4 MG disintegrating tablet  Every 8 hours PRN     11/14/18 1053    predniSONE (DELTASONE) 20 MG tablet  Daily     11/14/18 1053           Note:  This document was prepared using Dragon voice recognition software and may include unintentional dictation errors.    Alfred Levins, [place], MD 11/14/18 1054

## 2018-11-14 NOTE — ED Triage Notes (Signed)
Pt arrived via POV with abd pain. She was unable to see her primary due to her switching insurance. Pt also has nausea, headache, and abd pain. Hx of Chron's.

## 2018-11-21 ENCOUNTER — Ambulatory Visit: Payer: Self-pay | Admitting: Family Medicine

## 2019-01-02 ENCOUNTER — Other Ambulatory Visit: Payer: Self-pay | Admitting: Family Medicine

## 2019-01-02 DIAGNOSIS — F32A Depression, unspecified: Secondary | ICD-10-CM

## 2019-01-02 DIAGNOSIS — F419 Anxiety disorder, unspecified: Secondary | ICD-10-CM

## 2019-01-02 DIAGNOSIS — F329 Major depressive disorder, single episode, unspecified: Secondary | ICD-10-CM

## 2019-01-02 DIAGNOSIS — F5104 Psychophysiologic insomnia: Secondary | ICD-10-CM

## 2019-07-14 ENCOUNTER — Emergency Department: Payer: 59

## 2019-07-14 ENCOUNTER — Other Ambulatory Visit: Payer: Self-pay

## 2019-07-14 ENCOUNTER — Emergency Department
Admission: EM | Admit: 2019-07-14 | Discharge: 2019-07-14 | Disposition: A | Discharge status: Home / Self Care | Payer: 59 | Attending: Emergency Medicine | Admitting: Emergency Medicine

## 2019-07-14 DIAGNOSIS — Z8541 Personal history of malignant neoplasm of cervix uteri: Secondary | ICD-10-CM | POA: Diagnosis not present

## 2019-07-14 DIAGNOSIS — W19XXXA Unspecified fall, initial encounter: Secondary | ICD-10-CM

## 2019-07-14 DIAGNOSIS — Z79899 Other long term (current) drug therapy: Secondary | ICD-10-CM | POA: Insufficient documentation

## 2019-07-14 DIAGNOSIS — M25511 Pain in right shoulder: Secondary | ICD-10-CM | POA: Diagnosis present

## 2019-07-14 DIAGNOSIS — Z8673 Personal history of transient ischemic attack (TIA), and cerebral infarction without residual deficits: Secondary | ICD-10-CM | POA: Insufficient documentation

## 2019-07-14 DIAGNOSIS — I10 Essential (primary) hypertension: Secondary | ICD-10-CM | POA: Insufficient documentation

## 2019-07-14 DIAGNOSIS — M75101 Unspecified rotator cuff tear or rupture of right shoulder, not specified as traumatic: Secondary | ICD-10-CM | POA: Diagnosis not present

## 2019-07-14 DIAGNOSIS — F17210 Nicotine dependence, cigarettes, uncomplicated: Secondary | ICD-10-CM | POA: Diagnosis not present

## 2019-07-14 DIAGNOSIS — Z7982 Long term (current) use of aspirin: Secondary | ICD-10-CM | POA: Diagnosis not present

## 2019-07-14 MED ORDER — ONDANSETRON 4 MG PO TBDP: 4.0000 mg | ORAL_TABLET | Freq: Three times a day (TID) | ORAL | 0 refills | Status: DC | PRN

## 2019-07-14 MED ORDER — HYDROCODONE-ACETAMINOPHEN 5-325 MG PO TABS: 1.0000 | ORAL_TABLET | ORAL | 0 refills | Status: DC | PRN

## 2019-07-14 NOTE — ED Triage Notes (Signed)
Pt states a week ago she fell and now c/o R shoulder pain and back pain. States felt better and then pain came back today. A&O, ambulatory. States was lifting heavy bucket full of water last night because washing machine broke and she had to pour water in.

## 2019-07-14 NOTE — ED Notes (Signed)
NAD noted at time of D/C. Pt denies questions or concerns. Pt ambulatory to the lobby at this time.  

## 2019-07-14 NOTE — ED Provider Notes (Signed)
[facility] EMERGENCY DEPARTMENT Provider Note   CSN: [id number] Arrival date & time: 07/14/19  1537     History   Chief Complaint Chief Complaint  Patient presents with   Shoulder Pain   Back Pain    HPI Sharon [name] is a 63 y.o. female presents emergency department evaluation of right shoulder pain.  She complains of pain along the posterior subacromial space, superior scapular border as well as the lateral subacromial space.  She describes aching pain worse with shoulder range of motion, particularly elevating her elbow above her shoulder.  Patient's pain is moderate.  Patient states she suffered a mechanical fall 3 weeks ago which flared up her shoulder pain.  She has a history of prior rotator cuff repair several years ago.  No neck pain numbness tingling radicular symptoms.  She denies hitting her head and having headache.  No nausea vomiting.  No chest pain, shortness of breath.     HPI  Past Medical History:  Diagnosis Date   Anxiety and depression    Arthritis    B12 deficiency    pernicious anemia - monthly b12 shots   Cervical cancer ([place]) 12/2008   s/p hysterectomy   Crohn's disease ([place]) 2000   h/o stenotic crohn's ileitis, active in TI s/p resections 2000, 2016 PPD neg ([facility] Dr. [name]); has been on cimzia, remicade, 6MP, entocort, now stable on Entyvio ([facility])   Genital warts    GERD (gastroesophageal reflux disease)    severe, daily sxs if off PPI   Hepatic steatosis 04/2011   diffuse on CT, 57m gallbladder polyp   History of anemia    attributed to crohn's   History of chicken pox    History of syphilis 1980s   HTN (hypertension)    Migraines    and frequent other headaches (sinus or stress)   Scalp psoriasis    Sensorineural hearing loss of both ears 12/2012   high freq   Takotsubo syndrome 07/2012   cath 08/01/12, normal coronaries, LVEF 65%   [facility] (transient ischemic attack) [facility]   [facility] complex [facility] (w/u negative - carotids, echo, TC doppler, and MRI normal)   Tobacco abuse     Patient Active Problem List   Diagnosis Date Noted   Chronic insomnia 03/27/2018   Obesity, Class I, BMI 30.0-34.9 (see actual BMI) 01/24/2018   Hematuria 01/01/2016   Hyperglycemia 05/09/2013   HLD (hyperlipidemia) 02/12/2013   Takotsubo syndrome    Chest pain 08/02/2012   Bilateral hearing loss 06/27/2012   GERD (gastroesophageal reflux disease)    History of pneumonia 01/18/2012   Headache 10/13/2011   Healthcare maintenance 08/16/2011   Leukopenia 08/16/2011   Vitamin B12 deficiency    HTN (hypertension)    Migraines    Crohn's disease ([place]    Anxiety and depression    Ex-smoker    TIA (transient ischemic attack)    Hepatic steatosis 04/30/2011    Past Surgical History:  Procedure Laterality Date   abd [place] 04/2011   diffuse hepatic steatosis, 549mpolyp in gallbaldder fundus, no stones, mod abd ath   [place]   normal [place] f[place], widely patent coronaries   CERVICAL BIOPSY  W/ LOOP ELECTRODE EXCISION  10/2008   COLON RESECTION  10/2014   [facility] scar tissue [facility]  COLONOSCOPY  10/2008   ileo-colonic anastomosis, ielocolonic  crohn's   COLONOSCOPY  05/2011   ileo-colonic anastomosis normal, normal mucosa   COLONOSCOPY  10/15/2012   focal inflammation at anastomosis, no active crohn's, planning MR enterograph [name])   COLONOSCOPY  09/2014   [facility]   ESOPHAGOGASTRODUODENOSCOPY  05/2011   nl esophagus, gastritis, nl duodenum   hospitalization  05/2010   TIA vs complex migraine - w/u negative - head CT, MRI/MRA, carotid dopplers, echo.  treated with ASA and tramadol   KNEE SURGERY Left    LEFT HEART CATHETERIZATION WITH CORONARY ANGIOGRAM N/A 08/01/2012   Procedure: LEFT HEART CATHETERIZATION WITH CORONARY ANGIOGRAM;  Surgeon: Sherren Mocha,  MD;  Location: [facility] CATH LAB;  Service: Cardiovascular;  Laterality: N/A;   RIGHT COLECTOMY  2000   crohn's disease, ileocecal resection   SHOULDER ARTHROSCOPY W/ ROTATOR CUFF REPAIR Right 05/31/2012   [facility] Tamera Punt)   US ECHOCARDIOGRAPHY  05/2010   nl LV fxn ,EF 60%   VAGINAL HYSTERECTOMY  12/2008   LAVH/BSO     OB History    Gravida  4   Para  3   Term  3   Preterm      AB  1   Living  3     SAB  1   TAB      Ectopic      Multiple      Live Births  3            Home Medications    Prior to Admission medications   Medication Sig Start Date End Date Taking? Authorizing Provider  aspirin EC 81 MG tablet Take 1 tablet (81 mg total) by mouth daily. Over the counter 05/01/16   Rai, Vernelle Emerald, MD  cyanocobalamin (,VITAMIN B-12,) 1000 MCG/ML injection Inject 1,000 mcg into the muscle every 30 (thirty) days.    [provider]  diphenhydrAMINE (BENADRYL) 25 mg capsule Take 12.5 mg by mouth at bedtime as needed for sleep.    [provider]  escitalopram (LEXAPRO) 20 MG tablet TAKE 1 TABLET BY MOUTH ONCE DAILY 09/11/18   Ria Bush, MD  HYDROcodone-acetaminophen [facility]) 5-325 MG tablet Take 1 tablet by mouth every 4 (four) hours as needed for moderate pain. 07/14/19   Duanne Guess, PA-C  LORazepam (ATIVAN) 0.5 MG tablet Take 1 tablet (0.5 mg total) by mouth at bedtime as needed. for sleep 10/12/18   Ria Bush, MD  metoprolol tartrate (LOPRESSOR) 50 MG tablet TAKE 1 TABLET BY MOUTH TWICE DAILY 07/04/18   Ria Bush, MD  ondansetron (ZOFRAN ODT) 4 MG disintegrating tablet Take 1 tablet (4 mg total) by mouth every 8 (eight) hours as needed for nausea or vomiting. 07/14/19   Duanne Guess, PA-C  ondansetron (ZOFRAN) 4 MG tablet Take 1 tablet (4 mg total) by mouth every 8 (eight) hours as needed for nausea or vomiting. Patient not taking: Reported on 05/23/2018 03/17/18   Schuyler Amor, MD  promethazine (PHENERGAN) 25 MG  suppository Place 1 suppository (25 mg total) rectally every 6 (six) hours as needed for nausea. 06/09/18 06/09/19  Schuyler Amor, MD  traZODone (DESYREL) 50 MG tablet Take 0.5-1 tablets (25-50 mg total) by mouth at bedtime as needed for sleep. 03/27/18   Ria Bush, MD  vedolizumab (ENTYVIO) 300 MG injection Inject into the vein every 8 (eight) weeks.     [provider]    Family History Family History  Problem Relation Age of Onset   Crohn's disease Daughter  crohn's, ulcerative colitis   Ulcerative colitis Daughter    Stroke Mother    Hypertension Mother    Hyperlipidemia Mother    Hypertension Father    Crohn's disease Father    Crohn's disease Sister    Cancer Sister        ovarian   Crohn's disease Paternal Aunt    Crohn's disease Paternal Uncle    Diabetes Neg Hx     Social History Social History   Tobacco Use   Smoking status: Current Some Day Smoker    Packs/day: 0.33    Years: 10.00    Pack years: 3.30    Types: Cigarettes    Last attempt to quit: 11/14/2016    Years since quitting: 2.6   Smokeless tobacco: Never Used   Tobacco comment: Quit within past yr  Substance Use Topics   Alcohol use: Yes    Comment: occ   Drug use: No    Comment: extensive drug use, remotely     Allergies   Infliximab, Codeine, Flagyl [metronidazole], Hydrocodone, Ibuprofen, and Remeron [mirtazapine]   Review of Systems Review of Systems  Constitutional: Negative for activity change.  Eyes: Negative for pain and visual disturbance.  Respiratory: Negative for shortness of breath.   Cardiovascular: Negative for chest pain and leg swelling.  Gastrointestinal: Negative for abdominal pain.  Genitourinary: Negative for flank pain and pelvic pain.  Musculoskeletal: Positive for arthralgias. Negative for gait problem, joint swelling, myalgias, neck pain and neck stiffness.  Skin: Negative for wound.  Neurological: Negative for dizziness,  syncope, weakness, light-headedness, numbness and headaches.  Psychiatric/Behavioral: Negative for confusion and decreased concentration.     Physical Exam Updated Vital Signs BP (!) 128/111 (BP Location: Left Arm)    Pulse 68    Temp 98.3 F (36.8 C)    Ht 5' 1.5" (1.562 m)    Wt 73.5 kg    SpO2 98%    BMI 30.11 kg/m   Physical Exam Constitutional:      Appearance: She is well-developed.  HENT:     Head: Normocephalic and atraumatic.     Right Ear: External ear normal.     Left Ear: External ear normal.     Nose: Nose normal.  Eyes:     Conjunctiva/sclera: Conjunctivae normal.     Pupils: Pupils are equal, round, and reactive to light.  Neck:     Musculoskeletal: Normal range of motion.  Cardiovascular:     Rate and Rhythm: Normal rate.  Pulmonary:     Effort: Pulmonary effort is normal. No respiratory distress.     Breath sounds: Normal breath sounds.  Abdominal:     Palpations: Abdomen is soft.     Tenderness: There is no abdominal tenderness.  Musculoskeletal:     Comments: Right upper extremity shows patient has tenderness over the midshaft of the clavicle.  Mild AC joint tenderness and subacromial space tenderness.  She has full active range of motion of the shoulder with discomfort above 90 degrees flexion and abduction.  She has pain with internal rotation, internal rotation slight limited.  Normal external rotation with no discomfort.  Positive Hawkins, impingement empty can test.  Negative drop arm test.  She is neurovascular intact in right upper extremity.  Normal neck range of motion with no tenderness and negative Spurling's test.  Skin:    General: Skin is warm and dry.     Findings: No rash.  Neurological:     Mental Status: She is alert  and oriented to person, place, and time.     Cranial Nerves: No cranial nerve deficit.     Coordination: Coordination normal.  Psychiatric:        Behavior: Behavior normal.      ED Treatments / Results  Labs (all labs  ordered are listed, but only abnormal results are displayed) Labs Reviewed - No data to display  EKG None  Radiology Dg Clavicle Right  Result Date: 07/14/2019 CLINICAL DATA:  Fall with tenderness to the clavicle EXAM: RIGHT CLAVICLE - 2+ VIEWS COMPARISON:  03/13/2012 FINDINGS: There is no evidence of fracture or other focal bone lesions. Soft tissues are unremarkable. IMPRESSION: Negative. Electronically Signed   By: Donavan Foil M.D.   On: 07/14/2019 17:17    Procedures Procedures (including critical care time)  Medications Ordered in ED Medications - No data to display   Initial Impression / Assessment and Plan / ED Course  I have reviewed the triage vital signs and the nursing notes.  Pertinent labs & imaging results that were available during my care of the patient were reviewed by me and considered in my medical decision making (see chart for details).        63 year old female with fall several weeks ago.  She suffered right shoulder pain.  Has impingement signs on history and exam.  Discussed rotator cuff tendinitis.  Unable to take NSAIDs nor steroids.  Will give Norco, Zofran.  Patient states she gets mild nausea with Norco therefore tolerates it better with Zofran.  She will follow-up with her orthopedist, Dr. Malena Catholic in [place] or PCP if no improvement.  She understands signs symptoms return to the ER for.  Final Clinical Impressions(s) / ED Diagnoses   Final diagnoses:  Rotator cuff syndrome, right    ED Discharge Orders         Ordered    HYDROcodone-acetaminophen (NORCO) 5-325 MG tablet  Every 4 hours PRN     07/14/19 1745    ondansetron (ZOFRAN ODT) 4 MG disintegrating tablet  Every 8 hours PRN     07/14/19 1745           Renata Caprice 07/14/19 1751    Lavonia Drafts, MD 07/14/19 1754

## 2019-07-14 NOTE — ED Notes (Addendum)
Pt c/o R arm pain, states fell "a few weeks ago and hurt it". Pt states that the pain got better then was lifting something out of the washing machine yesterday with sudden onset to R shoulder area. Pt with noted guarding of R arm at this time.

## 2019-07-14 NOTE — Discharge Instructions (Signed)
Please take Norco as needed for pain.  You may use Zofran as needed for nausea due to [place].  Please call orthopedist or primary care provider to schedule follow-up appointment in 1 week.  Return to the ER for any worsening symptoms or urgent changes in your health.

## 2020-04-03 ENCOUNTER — Other Ambulatory Visit
Admission: RE | Admit: 2020-04-03 | Discharge: 2020-04-03 | Disposition: A | Discharge status: Home / Self Care | Payer: 59 | Source: Ambulatory Visit | Attending: Student | Admitting: Student

## 2020-04-03 DIAGNOSIS — R0602 Shortness of breath: Secondary | ICD-10-CM | POA: Insufficient documentation

## 2020-04-03 DIAGNOSIS — G459 Transient cerebral ischemic attack, unspecified: Secondary | ICD-10-CM | POA: Diagnosis not present

## 2020-04-03 DIAGNOSIS — I10 Essential (primary) hypertension: Secondary | ICD-10-CM | POA: Insufficient documentation

## 2020-04-03 DIAGNOSIS — I208 Other forms of angina pectoris: Secondary | ICD-10-CM | POA: Insufficient documentation

## 2020-04-03 LAB — BRAIN NATRIURETIC PEPTIDE: B Natriuretic Peptide: 65.3 pg/mL (ref 0.0–100.0)

## 2020-04-13 ENCOUNTER — Other Ambulatory Visit: Payer: Self-pay

## 2020-04-13 ENCOUNTER — Other Ambulatory Visit
Admission: RE | Admit: 2020-04-13 | Discharge: 2020-04-13 | Disposition: A | Discharge status: Home / Self Care | Payer: 59 | Source: Ambulatory Visit | Attending: Internal Medicine | Admitting: Internal Medicine

## 2020-04-13 DIAGNOSIS — Z01812 Encounter for preprocedural laboratory examination: Secondary | ICD-10-CM | POA: Insufficient documentation

## 2020-04-13 DIAGNOSIS — Z20822 Contact with and (suspected) exposure to covid-19: Secondary | ICD-10-CM | POA: Diagnosis not present

## 2020-04-13 LAB — SARS CORONAVIRUS 2 (TAT 6-24 HRS): SARS Coronavirus 2: NEGATIVE

## 2020-04-15 ENCOUNTER — Encounter: Payer: Self-pay | Admitting: Internal Medicine

## 2020-04-15 ENCOUNTER — Other Ambulatory Visit: Payer: Self-pay

## 2020-04-15 ENCOUNTER — Encounter
Admission: RE | Disposition: A | Discharge status: Home / Self Care | Payer: Self-pay | Source: Home / Self Care | Attending: Internal Medicine

## 2020-04-15 ENCOUNTER — Ambulatory Visit
Admission: RE | Admit: 2020-04-15 | Discharge: 2020-04-15 | Disposition: A | Discharge status: Home / Self Care | Payer: 59 | Attending: Internal Medicine | Admitting: Internal Medicine

## 2020-04-15 DIAGNOSIS — K219 Gastro-esophageal reflux disease without esophagitis: Secondary | ICD-10-CM | POA: Diagnosis not present

## 2020-04-15 DIAGNOSIS — I10 Essential (primary) hypertension: Secondary | ICD-10-CM | POA: Diagnosis not present

## 2020-04-15 DIAGNOSIS — I208 Other forms of angina pectoris: Secondary | ICD-10-CM | POA: Insufficient documentation

## 2020-04-15 DIAGNOSIS — Z6832 Body mass index (BMI) 32.0-32.9, adult: Secondary | ICD-10-CM | POA: Insufficient documentation

## 2020-04-15 DIAGNOSIS — R0602 Shortness of breath: Secondary | ICD-10-CM | POA: Insufficient documentation

## 2020-04-15 DIAGNOSIS — K5090 Crohn's disease, unspecified, without complications: Secondary | ICD-10-CM | POA: Diagnosis not present

## 2020-04-15 DIAGNOSIS — E669 Obesity, unspecified: Secondary | ICD-10-CM | POA: Diagnosis not present

## 2020-04-15 DIAGNOSIS — Z8673 Personal history of transient ischemic attack (TIA), and cerebral infarction without residual deficits: Secondary | ICD-10-CM | POA: Insufficient documentation

## 2020-04-15 DIAGNOSIS — F419 Anxiety disorder, unspecified: Secondary | ICD-10-CM | POA: Insufficient documentation

## 2020-04-15 DIAGNOSIS — Z79899 Other long term (current) drug therapy: Secondary | ICD-10-CM | POA: Insufficient documentation

## 2020-04-15 DIAGNOSIS — R002 Palpitations: Secondary | ICD-10-CM | POA: Diagnosis not present

## 2020-04-15 DIAGNOSIS — F329 Major depressive disorder, single episode, unspecified: Secondary | ICD-10-CM | POA: Diagnosis not present

## 2020-04-15 DIAGNOSIS — Z87891 Personal history of nicotine dependence: Secondary | ICD-10-CM | POA: Diagnosis not present

## 2020-04-15 DIAGNOSIS — Z7982 Long term (current) use of aspirin: Secondary | ICD-10-CM | POA: Diagnosis not present

## 2020-04-15 DIAGNOSIS — E782 Mixed hyperlipidemia: Secondary | ICD-10-CM | POA: Diagnosis not present

## 2020-04-15 HISTORY — PX: LEFT HEART CATH AND CORONARY ANGIOGRAPHY: CATH118249

## 2020-04-15 SURGERY — LEFT HEART CATH AND CORONARY ANGIOGRAPHY
Anesthesia: Moderate Sedation | Laterality: Left

## 2020-04-15 MED ORDER — FENTANYL CITRATE (PF) 100 MCG/2ML IJ SOLN
INTRAMUSCULAR | Status: DC | PRN
  Administered 2020-04-15: 25 ug via INTRAVENOUS

## 2020-04-15 MED ORDER — SODIUM CHLORIDE 0.9 % IV SOLN: 250.0000 mL | INTRAVENOUS | Status: DC | PRN

## 2020-04-15 MED ORDER — MIDAZOLAM HCL 2 MG/2ML IJ SOLN
INTRAMUSCULAR | Status: DC | PRN
  Administered 2020-04-15: 1 mg via INTRAVENOUS

## 2020-04-15 MED ORDER — ACETAMINOPHEN 325 MG PO TABS: 650.0000 mg | ORAL_TABLET | ORAL | Status: DC | PRN

## 2020-04-15 MED ORDER — LABETALOL HCL 5 MG/ML IV SOLN: 10.0000 mg | INTRAVENOUS | Status: DC | PRN

## 2020-04-15 MED ORDER — FENTANYL CITRATE (PF) 100 MCG/2ML IJ SOLN
INTRAMUSCULAR | Status: AC
  Filled 2020-04-15: qty 2

## 2020-04-15 MED ORDER — VERAPAMIL HCL 2.5 MG/ML IV SOLN
INTRAVENOUS | Status: DC | PRN
  Administered 2020-04-15: 2.5 mg via INTRA_ARTERIAL

## 2020-04-15 MED ORDER — SODIUM CHLORIDE 0.9% FLUSH: 3.0000 mL | Freq: Two times a day (BID) | INTRAVENOUS | Status: DC

## 2020-04-15 MED ORDER — ASPIRIN 81 MG PO CHEW
CHEWABLE_TABLET | ORAL | Status: AC
  Filled 2020-04-15: qty 1

## 2020-04-15 MED ORDER — HEPARIN (PORCINE) IN NACL 1000-0.9 UT/500ML-% IV SOLN
INTRAVENOUS | Status: AC
  Filled 2020-04-15: qty 1000

## 2020-04-15 MED ORDER — SODIUM CHLORIDE 0.9 % WEIGHT BASED INFUSION
239.4000 mL/h | INTRAVENOUS | Status: AC
  Administered 2020-04-15: 240 mL via INTRAVENOUS

## 2020-04-15 MED ORDER — MIDAZOLAM HCL 2 MG/2ML IJ SOLN
INTRAMUSCULAR | Status: AC
  Filled 2020-04-15: qty 2

## 2020-04-15 MED ORDER — SODIUM CHLORIDE 0.9% FLUSH: 3.0000 mL | INTRAVENOUS | Status: DC | PRN

## 2020-04-15 MED ORDER — HEPARIN SODIUM (PORCINE) 1000 UNIT/ML IJ SOLN
INTRAMUSCULAR | Status: AC
  Filled 2020-04-15: qty 1

## 2020-04-15 MED ORDER — HYDRALAZINE HCL 20 MG/ML IJ SOLN: 10.0000 mg | INTRAMUSCULAR | Status: DC | PRN

## 2020-04-15 MED ORDER — ONDANSETRON HCL 4 MG/2ML IJ SOLN: 4.0000 mg | Freq: Four times a day (QID) | INTRAMUSCULAR | Status: DC | PRN

## 2020-04-15 MED ORDER — LIDOCAINE HCL (PF) 1 % IJ SOLN
INTRAMUSCULAR | Status: AC
  Filled 2020-04-15: qty 30

## 2020-04-15 MED ORDER — VERAPAMIL HCL 2.5 MG/ML IV SOLN
INTRAVENOUS | Status: AC
  Filled 2020-04-15: qty 2

## 2020-04-15 MED ORDER — HEPARIN SODIUM (PORCINE) 1000 UNIT/ML IJ SOLN
INTRAMUSCULAR | Status: DC | PRN
  Administered 2020-04-15: 4000 [IU] via INTRAVENOUS

## 2020-04-15 MED ORDER — IOHEXOL 300 MG/ML  SOLN
INTRAMUSCULAR | Status: DC | PRN
  Administered 2020-04-15: 45 mL

## 2020-04-15 MED ORDER — ASPIRIN 81 MG PO CHEW
81.0000 mg | CHEWABLE_TABLET | ORAL | Status: AC
  Administered 2020-04-15: 81 mg via ORAL

## 2020-04-15 MED ORDER — SODIUM CHLORIDE 0.9 % WEIGHT BASED INFUSION: 1.0000 mL/kg/h | INTRAVENOUS | Status: DC

## 2020-04-15 SURGICAL SUPPLY — 8 items
CATH INFINITI 5 FR JL3.5 (CATHETERS) ×1 IMPLANT
CATH INFINITI JR4 5F (CATHETERS) ×1 IMPLANT
DEVICE RAD TR BAND REGULAR (VASCULAR PRODUCTS) ×1 IMPLANT
GLIDESHEATH SLEND SS 6F .021 (SHEATH) ×1 IMPLANT
GUIDEWIRE INQWIRE 1.5J.035X260 (WIRE) IMPLANT
INQWIRE 1.5J .035X260CM (WIRE) ×2
KIT MANI 3VAL PERCEP (MISCELLANEOUS) ×2 IMPLANT
PACK CARDIAC CATH (CUSTOM PROCEDURE TRAY) ×2 IMPLANT

## 2020-04-15 NOTE — Discharge Instructions (Signed)
Moderate Conscious Sedation, Adult, Care After These instructions provide you with information about caring for yourself after your procedure. Your health care provider may also give you more specific instructions. Your treatment has been planned according to current medical practices, but problems sometimes occur. Call your health care provider if you have any problems or questions after your procedure. What can I expect after the procedure? After your procedure, it is common:  To feel sleepy for several hours.  To feel clumsy and have poor balance for several hours.  To have poor judgment for several hours.  To vomit if you eat too soon. Follow these instructions at home: For at least 24 hours after the procedure:   Do not: ? Participate in activities where you could fall or become injured. ? Drive. ? Use heavy machinery. ? Drink alcohol. ? Take sleeping pills or medicines that cause drowsiness. ? Make important decisions or sign legal documents. ? Take care of children on your own.  Rest. Eating and drinking  Follow the diet recommended by your health care provider.  If you vomit: ? Drink water, juice, or soup when you can drink without vomiting. ? Make sure you have little or no nausea before eating solid foods. General instructions  Have a responsible adult stay with you until you are awake and alert.  Take over-the-counter and prescription medicines only as told by your health care provider.  If you smoke, do not smoke without supervision.  Keep all follow-up visits as told by your health care provider. This is important. Contact a health care provider if:  You keep feeling nauseous or you keep vomiting.  You feel light-headed.  You develop a rash.  You have a fever. Get help right away if:  You have trouble breathing. This information is not intended to replace advice given to you by your health care provider. Make sure you discuss any questions you have  with your health care provider. Document Revised: 07/28/2017 Document Reviewed: 12/05/2015 Elsevier Patient Education  2020 [employer]  This sheet gives you information about how to care for yourself after your procedure. Your health care provider may also give you more specific instructions. If you have problems or questions, contact your health care provider. What can I expect after the procedure? After the procedure, it is common to have:  Bruising and tenderness at the catheter insertion area. Follow these instructions at home: Medicines  Take over-the-counter and prescription medicines only as told by your health care provider. Insertion site care  Follow instructions from your health care provider about how to take care of your insertion site. Make sure you: ? Wash your hands with soap and water before you change your bandage (dressing). If soap and water are not available, use hand sanitizer. ? Change your dressing as told by your health care provider. ? Leave stitches (sutures), skin glue, or adhesive strips in place. These skin closures may need to stay in place for 2 weeks or longer. If adhesive strip edges start to loosen and curl up, you may trim the loose edges. Do not remove adhesive strips completely unless your health care provider tells you to do that.  Check your insertion site every day for signs of infection. Check for: ? Redness, swelling, or pain. ? Fluid or blood. ? Pus or a bad smell. ? Warmth.  Do not take baths, swim, or use a hot tub until your health care provider approves.  You may shower 24-48 hours  after the procedure, or as directed by your health care provider. ? Remove the dressing and gently wash the site with plain soap and water. ? Pat the area dry with a clean towel. ? Do not rub the site. That could cause bleeding.  Do not apply powder or lotion to the site. Activity   For 24 hours after the procedure, or as directed by  your health care provider: ? Do not flex or bend the affected arm. ? Do not push or pull heavy objects with the affected arm. ? Do not drive yourself home from the hospital or clinic. You may drive 24 hours after the procedure unless your health care provider tells you not to. ? Do not operate machinery or power tools.  Do not lift anything that is heavier than 10 lb (4.5 kg), or the limit that you are told, until your health care provider says that it is safe.  Ask your health care provider when it is okay to: ? Return to work or school. ? Resume usual physical activities or sports. ? Resume sexual activity. General instructions  If the catheter site starts to bleed, raise your arm and put firm pressure on the site. If the bleeding does not stop, get help right away. This is a medical emergency.  If you went home on the same day as your procedure, a responsible adult should be with you for the first 24 hours after you arrive home.  Keep all follow-up visits as told by your health care provider. This is important. Contact a health care provider if:  You have a fever.  You have redness, swelling, or yellow drainage around your insertion site. Get help right away if:  You have unusual pain at the radial site.  The catheter insertion area swells very fast.  The insertion area is bleeding, and the bleeding does not stop when you hold steady pressure on the area.  Your arm or hand becomes pale, cool, tingly, or numb. These symptoms may represent a serious problem that is an emergency. Do not wait to see if the symptoms will go away. Get medical help right away. Call your local emergency services (911 in the [place] Do not drive yourself to the hospital. Summary  After the procedure, it is common to have bruising and tenderness at the site.  Follow instructions from your health care provider about how to take care of your radial site wound. Check the wound every day for signs of  infection.  Do not lift anything that is heavier than 10 lb (4.5 kg), or the limit that you are told, until your health care provider says that it is safe. This information is not intended to replace advice given to you by your health care provider. Make sure you discuss any questions you have with your health care provider. Document Revised: 09/20/2017 Document Reviewed: 09/20/2017 Elsevier Patient Education  2020 [employer].

## 2020-04-16 ENCOUNTER — Encounter: Payer: Self-pay | Admitting: Internal Medicine

## 2020-07-15 ENCOUNTER — Other Ambulatory Visit: Payer: Self-pay

## 2020-07-15 ENCOUNTER — Emergency Department: Payer: 59

## 2020-07-15 ENCOUNTER — Emergency Department
Admission: EM | Admit: 2020-07-15 | Discharge: 2020-07-15 | Disposition: A | Discharge status: Home / Self Care | Payer: 59 | Attending: Emergency Medicine | Admitting: Emergency Medicine

## 2020-07-15 DIAGNOSIS — I10 Essential (primary) hypertension: Secondary | ICD-10-CM | POA: Insufficient documentation

## 2020-07-15 DIAGNOSIS — R112 Nausea with vomiting, unspecified: Secondary | ICD-10-CM | POA: Diagnosis not present

## 2020-07-15 DIAGNOSIS — Z79899 Other long term (current) drug therapy: Secondary | ICD-10-CM | POA: Diagnosis not present

## 2020-07-15 DIAGNOSIS — K5900 Constipation, unspecified: Secondary | ICD-10-CM | POA: Insufficient documentation

## 2020-07-15 DIAGNOSIS — R109 Unspecified abdominal pain: Secondary | ICD-10-CM

## 2020-07-15 DIAGNOSIS — R1013 Epigastric pain: Secondary | ICD-10-CM | POA: Diagnosis present

## 2020-07-15 DIAGNOSIS — Z87891 Personal history of nicotine dependence: Secondary | ICD-10-CM | POA: Insufficient documentation

## 2020-07-15 DIAGNOSIS — Z7982 Long term (current) use of aspirin: Secondary | ICD-10-CM | POA: Diagnosis not present

## 2020-07-15 LAB — CBC
HCT: 35.9 % — ABNORMAL LOW (ref 36.0–46.0)
Hemoglobin: 11.8 g/dL — ABNORMAL LOW (ref 12.0–15.0)
MCH: 29.4 pg (ref 26.0–34.0)
MCHC: 32.9 g/dL (ref 30.0–36.0)
MCV: 89.3 fL (ref 80.0–100.0)
Platelets: 242 10*3/uL (ref 150–400)
RBC: 4.02 MIL/uL (ref 3.87–5.11)
RDW: 13.7 % (ref 11.5–15.5)
WBC: 11.8 10*3/uL — ABNORMAL HIGH (ref 4.0–10.5)
nRBC: 0 % (ref 0.0–0.2)

## 2020-07-15 LAB — URINALYSIS, COMPLETE (UACMP) WITH MICROSCOPIC
Bilirubin Urine: NEGATIVE
Glucose, UA: NEGATIVE mg/dL
Ketones, ur: NEGATIVE mg/dL
Nitrite: NEGATIVE
Protein, ur: NEGATIVE mg/dL
Specific Gravity, Urine: 1.005 (ref 1.005–1.030)
pH: 6 (ref 5.0–8.0)

## 2020-07-15 LAB — COMPREHENSIVE METABOLIC PANEL
ALT: 22 U/L (ref 0–44)
AST: 40 U/L (ref 15–41)
Albumin: 3.8 g/dL (ref 3.5–5.0)
Alkaline Phosphatase: 101 U/L (ref 38–126)
Anion gap: 11 (ref 5–15)
BUN: 6 mg/dL — ABNORMAL LOW (ref 8–23)
CO2: 22 mmol/L (ref 22–32)
Calcium: 8.3 mg/dL — ABNORMAL LOW (ref 8.9–10.3)
Chloride: 105 mmol/L (ref 98–111)
Creatinine, Ser: 0.82 mg/dL (ref 0.44–1.00)
GFR, Estimated: >60 mL/min (ref >60)
Glucose, Bld: 119 mg/dL — ABNORMAL HIGH (ref 70–99)
Potassium: 3.5 mmol/L (ref 3.5–5.1)
Sodium: 138 mmol/L (ref 135–145)
Total Bilirubin: 0.6 mg/dL (ref 0.3–1.2)
Total Protein: 7.4 g/dL (ref 6.5–8.1)

## 2020-07-15 LAB — LIPASE, BLOOD: Lipase: 25 U/L (ref 11–51)

## 2020-07-15 MED ORDER — IOHEXOL 300 MG/ML  SOLN
100.0000 mL | Freq: Once | INTRAMUSCULAR | Status: AC | PRN
  Administered 2020-07-15: 100 mL via INTRAVENOUS

## 2020-07-15 MED ORDER — IOHEXOL 9 MG/ML PO SOLN
1000.0000 mL | Freq: Once | ORAL | Status: AC | PRN
  Administered 2020-07-15: 500 mL via ORAL

## 2020-07-15 NOTE — ED Provider Notes (Signed)
I assumed care of this patient approximately 1500.  Please see outgoing providers note for full details regarding patient's initial evaluation assessment.  In brief patient presents with history of Crohn's disease and prior abdominal surgeries complicated by SBO's with prior bowel resection who presents for assessment of 6 days of constipation associate with some nonbloody nonbilious vomiting and abdominal pain.  Labs are reassuring initially plan is to follow-up CT abdomen pelvis.  Lipase 25 not consistent with acute pancreatitis.  CMP without significant electrolyte metabolic derangements or evidence of cholestasis.  CBC with slight leukocytosis with WBC count of 11.8 with no other significant derangements.  UA with small blood and trace ileus with rare bacteria and otherwise unremarkable.  EKG remarkable for sinus rhythm with a ventricular rate of 60, normal axis, unremarkable intervals, and no clear evidence of acute ischemia although some nonspecific ST change in V2.  CT unremarkable. Patient tolerating PO. Impression is uncomplicated constipation. Discharged in stable condition with GI f/u.    Lucrezia Starch, MD 07/16/20 [phone]

## 2020-07-15 NOTE — Discharge Instructions (Signed)
Mix the entire 238-gram bottle of MiraLAX with 64 ounces of a sports drink. Drink all of the mixture over the next few hours until gone. (Suggestion: An 8-ounce glass every 15-30 minutes equals 2-4 hours.) It is very important to drink plenty of water and other liquids in order to avoid dehydration and to flush the bowel. (Although alcohol is a liquid, it can make you dehydrated. You should NOT drink alcohol while doing the cleanout.) NOTE: Please stay home once you have started your cleanout. Also, the use of moist towelettes or wipes may help to minimize discomfort during the cleanout. A nonprescription 1% hydrocortisone cream may also be soothing when applied to the rectal area after each bowel movement. It is common during the cleanout to experience some nausea, bloating, and/or abdominal distention. If you chilled the mixture prior to drinking it, you could experience chills from consuming so much cold liquid in a short time period. If you develop nausea or vomiting, slow down the rate at which you drink the solution. Please attempt to drink all of the laxative solution even if it takes you longer. Once stooling slows down, you may resume eating solid food.  Your abdominal CT showed: FINDINGS: Lower chest: The visualized heart size within normal limits. No pericardial fluid/thickening.   No hiatal hernia.   The visualized portions of the lungs are clear.   Hepatobiliary: There is diffuse low density seen throughout the liver parenchyma. No focal hepatic lesion is seen. No intra or extrahepatic biliary ductal dilatation.The main portal vein is patent. No evidence of calcified gallstones, gallbladder wall thickening or biliary dilatation.   Pancreas: Unremarkable. No pancreatic ductal dilatation or surrounding inflammatory changes.   Spleen: Normal in size without focal abnormality.   Adrenals/Urinary Tract: Both adrenal glands appear normal. The kidneys and collecting system appear  normal without evidence of urinary tract calculus or hydronephrosis. Bladder is unremarkable.   Stomach/Bowel: The stomach, small bowel, and colon are normal in appearance. No inflammatory changes, wall thickening, or obstructive findings. New moderate amount of colonic stool seen throughout. The patient is status post appendectomy.   Vascular/Lymphatic: There are no enlarged mesenteric, retroperitoneal, or pelvic lymph nodes. Scattered aortic atherosclerotic calcifications are seen without aneurysmal dilatation.   Reproductive: The patient is status post hysterectomy. No adnexal masses or collections seen.   Other: No evidence of abdominal wall mass or hernia.   Musculoskeletal: No acute or significant osseous findings.   IMPRESSION: Hepatic steatosis.   Moderate amount of colonic stool without definite evidence of obstruction.   Aortic Atherosclerosis (ICD10-I70.0).

## 2020-07-15 NOTE — ED Provider Notes (Signed)
[facility] Emergency Department Provider Note   ____________________________________________   First MD Initiated Contact with Patient 07/15/20 1407     (approximate)  I have reviewed the triage vital signs and the nursing notes.   HISTORY  Chief Complaint Abdominal Pain    HPI Sharon [name] is a 64 y.o. female with a stated past medical history of Crohn's disease status post 2 ileal resections who presents for midepigastric abdominal pain that began approximately 1 day prior to arrival, is described as aching/burning, and 7/10 in severity.  Patient denies any radiation of this pain.  Patient denies any exacerbating or relieving factors.  Patient states that this feels similar to previous obstructions that she has had in the past.  Patient is concerned because she has not had a bowel movement in the last 6 days and states that this is what happened when she has had bowel strictures in the past that required surgery.  Patient also endorses associated nausea and vomiting intermittently after eating         Past Medical History:  Diagnosis Date  . Anxiety and depression   . Arthritis   . B12 deficiency    pernicious anemia - monthly b12 shots  . Cervical cancer ([place]) 12/2008   s/p hysterectomy  . Crohn's disease ([place]) 2000   h/o stenotic crohn's ileitis, active in TI s/p resections 2000, 2016 PPD neg ([facility] Dr. [name]); has been on cimzia, remicade, 6MP, entocort, now stable on Entyvio ([facility] [place])  . Genital warts   . GERD (gastroesophageal reflux disease)    severe, daily sxs if off PPI  . Hepatic steatosis 04/2011   diffuse on CT, 52m gallbladder polyp  . History of anemia    attributed to crohn's  . History of chicken pox   . History of syphilis 1980s  . HTN (hypertension)   . Migraines    and frequent other headaches (sinus or stress)  . Scalp psoriasis   . Sensorineural hearing loss of both ears 12/2012   high freq  .  Takotsubo syndrome 07/2012   cath 08/01/12, normal coronaries, LVEF 65%  . [facility]   [facility] (w/u negative - carotids, echo, TC doppler, and MRI normal)  . Tobacco abuse     Patient Active Problem List   Diagnosis Date Noted  . Chronic insomnia 03/27/2018  . Obesity, Class I, BMI 30.0-34.9 (see actual BMI) 01/24/2018  . Hematuria 01/01/2016  . Hyperglycemia 05/09/2013  . HLD (hyperlipidemia) 02/12/2013  . Takotsubo syndrome   . Chest pain 08/02/2012  . Bilateral hearing loss 06/27/2012  . GERD (gastroesophageal reflux disease)   . History of pneumonia 01/18/2012  . Headache 10/13/2011  . Healthcare maintenance 08/16/2011  . Leukopenia 08/16/2011  . Vitamin B12 deficiency   . HTN (hypertension)   . Migraines   . [place]   . Anxiety and depression   . Ex-smoker   . TIA (transient ischemic attack)   . Hepatic steatosis 04/30/2011    Past Surgical History:  Procedure Laterality Date  . abd UKorea 04/2011   diffuse hepatic steatosis, 542mpolyp in gallbaldder fundus, no stones, mod abd ath  . APPENDECTOMY  1998  . CARDIAC CATHETERIZATION  07/2012   normal LV fxn, widely patent coronaries  . CERVICAL BIOPSY  W/ LOOP ELECTRODE EXCISION  10/2008  . COLON RESECTION  10/2014   removal of scar tissue colon from  prior surgery ([facility])  . COLONOSCOPY  10/2008   ileo-colonic anastomosis, ielocolonic crohn's  . COLONOSCOPY  05/2011   ileo-colonic anastomosis normal, normal mucosa  . COLONOSCOPY  10/15/2012   focal inflammation at anastomosis, no active crohn's, planning MR enterograph [name])  . COLONOSCOPY  09/2014   [facility]  . ESOPHAGOGASTRODUODENOSCOPY  05/2011   nl esophagus, gastritis, nl duodenum  . hospitalization  05/2010   TIA vs complex migraine - w/u negative - head CT, MRI/MRA, carotid dopplers, echo.  treated with ASA and tramadol  . KNEE SURGERY Left   . LEFT HEART CATH AND CORONARY  ANGIOGRAPHY Left 04/15/2020   Procedure: LEFT HEART CATH AND CORONARY ANGIOGRAPHY;  Surgeon: Yolonda Kida, MD;  Location: [place] [facility];  Service: Cardiovascular;  Laterality: Left;  . LEFT HEART CATHETERIZATION WITH CORONARY ANGIOGRAM N/A 08/01/2012   Procedure: LEFT HEART CATHETERIZATION WITH CORONARY ANGIOGRAM;  Surgeon: Sherren Mocha, MD;  Location: [facility] CATH LAB;  Service: Cardiovascular;  Laterality: N/A;  . RIGHT COLECTOMY  2000   crohn's disease, ileocecal resection  . SHOULDER ARTHROSCOPY W/ ROTATOR CUFF REPAIR Right 05/31/2012   [facility] Tamera Punt)  . US ECHOCARDIOGRAPHY  05/2010   nl LV fxn ,EF 60%  . VAGINAL HYSTERECTOMY  12/2008   [facility]/BSO    Prior to Admission medications   Medication Sig Start Date End Date Taking? Authorizing Provider  acetaminophen (TYLENOL) 500 MG tablet Take 1,000-1,500 mg by mouth 2 (two) times daily as needed for moderate pain or headache.    [provider]  aspirin EC 81 MG tablet Take 1 tablet (81 mg total) by mouth daily. Over the counter 05/01/16   Rai, Vernelle Emerald, MD  Carboxymethylcellul-Glycerin (LUBRICATING EYE DROPS OP) Place 1 drop into both eyes daily as needed (dry eyes).    [provider]  diphenhydrAMINE (BENADRYL) 25 mg capsule Take 50 mg by mouth every 8 (eight) hours as needed for allergies.     [provider]  escitalopram (LEXAPRO) 20 MG tablet TAKE 1 TABLET BY MOUTH ONCE DAILY Patient taking differently: Take 20 mg by mouth daily.  09/11/18   Ria Bush, MD  Melatonin 10 MG CAPS Take 10 mg by mouth at bedtime.    [provider]  metoprolol tartrate (LOPRESSOR) 50 MG tablet TAKE 1 TABLET BY MOUTH TWICE DAILY Patient taking differently: Take 50 mg by mouth 2 (two) times daily.  07/04/18   Ria Bush, MD  nortriptyline (PAMELOR) 75 MG capsule Take 75 mg by mouth at bedtime.    [provider]  omeprazole (PRILOSEC) 20 MG capsule Take 20 mg by mouth daily.     [provider]  rosuvastatin (CRESTOR) 5 MG tablet Take 5 mg by mouth at bedtime.    [provider]  vedolizumab (ENTYVIO) 300 MG injection Inject into the vein every 8 (eight) weeks.     [provider]  vitamin B-12 (CYANOCOBALAMIN) 500 MCG tablet Take 500 mcg by mouth daily.    [provider]    Allergies Infliximab, Codeine, Flagyl [metronidazole], Hydrocodone, Ibuprofen, and Remeron [mirtazapine]  Family History  Problem Relation Age of Onset  . Crohn's disease Daughter        crohn's, ulcerative colitis  . Ulcerative colitis Daughter   . Stroke Mother   . Hypertension Mother   . Hyperlipidemia Mother   . Hypertension Father   . Crohn's disease Father   . Crohn's disease Sister   . Cancer Sister  ovarian  . Crohn's disease Paternal Aunt   . Crohn's disease Paternal Uncle   . Diabetes Neg Hx     Social History Social History   Tobacco Use  . Smoking status: Former Smoker    Packs/day: 0.33    Years: 10.00    Pack years: 3.30    Types: Cigarettes    Quit date: 11/14/2016    Years since quitting: 3.6  . Smokeless tobacco: Never Used  . Tobacco comment: Quit within past yr  Vaping Use  . Vaping Use: Never used  Substance Use Topics  . Alcohol use: Yes    Comment: occ  . Drug use: No    Comment: extensive drug use, remotely    Review of Systems Constitutional: No fever/chills Eyes: No visual changes. ENT: No sore throat. Cardiovascular: Denies chest pain. Respiratory: Denies shortness of breath. Gastrointestinal: Endorses abdominal pain.  Endorses nausea, no vomiting.  No diarrhea. Genitourinary: Negative for dysuria. Musculoskeletal: Negative for acute arthralgias Skin: Negative for rash. Neurological: Negative for headaches, weakness/numbness/paresthesias in any extremity Psychiatric: Negative for suicidal ideation/homicidal ideation   ____________________________________________   PHYSICAL EXAM:  VITAL  SIGNS: ED Triage Vitals [07/15/20 1317]  Enc Vitals Group     BP (!) 104/56     Pulse Rate 66     Resp 16     Temp 98 F (36.7 C)     Temp Source Oral     SpO2 98 %     Weight 177 lb (80.3 kg)     Height 5' 1"  (1.549 m)     Head Circumference      Peak Flow      Pain Score 6     Pain Loc      Pain Edu?      Excl. in [place]?    Constitutional: Alert and oriented. Well appearing and in no acute distress. Eyes: Conjunctivae are normal. PERRL. Head: Atraumatic. Nose: No congestion/rhinnorhea. Mouth/Throat: Mucous membranes are moist. Neck: No stridor Cardiovascular: Grossly normal heart sounds.  Good peripheral circulation. Respiratory: Normal respiratory effort.  No retractions. Gastrointestinal: Soft.  Mild tenderness palpation the midepigastric region. No distention. Musculoskeletal: No obvious deformities Neurologic:  Normal speech and language. No gross focal neurologic deficits are appreciated. Skin:  Skin is warm and dry. No rash noted. Psychiatric: Mood and affect are normal. Speech and behavior are normal.  ____________________________________________   LABS (all labs ordered are listed, but only abnormal results are displayed)  Labs Reviewed  COMPREHENSIVE METABOLIC PANEL - Abnormal; Notable for the following components:      Result Value   Glucose, Bld 119 (*)    BUN 6 (*)    Calcium 8.3 (*)    All other components within normal limits  CBC - Abnormal; Notable for the following components:   WBC 11.8 (*)    Hemoglobin 11.8 (*)    HCT 35.9 (*)    All other components within normal limits  URINALYSIS, COMPLETE (UACMP) WITH MICROSCOPIC - Abnormal; Notable for the following components:   Color, Urine YELLOW (*)    APPearance CLEAR (*)    Hgb urine dipstick SMALL (*)    Leukocytes,Ua TRACE (*)    Bacteria, UA RARE (*)    All other components within normal limits  LIPASE, BLOOD   ____________________________________________  EKG  ED ECG REPORT I, Naaman Plummer, the attending physician, personally viewed and interpreted this ECG.  Date: 07/15/2020 EKG Time: 1504 Rate: 60 Rhythm: normal sinus rhythm QRS  Axis: normal Intervals: normal ST/T Wave abnormalities: normal Narrative Interpretation: no evidence of acute ischemia  PROCEDURES  Procedure(s) performed (including Critical Care):  Procedures   ____________________________________________   INITIAL IMPRESSION / ASSESSMENT AND PLAN / ED COURSE  As part of my medical decision making, I reviewed the following data within the [place] notes reviewed and incorporated, Labs reviewed, EKG interpreted, Old chart reviewed, and Notes from prior ED visits reviewed and incorporated        Patients symptoms not typical for emergent causes of abdominal pain such as, but not limited to, appendicitis, abdominal aortic aneurysm, surgical biliary disease, pancreatitis, SBO, mesenteric ischemia, serious intra-abdominal bacterial illness. Presentation also not typical of gynecologic emergencies such as TOA, Ovarian Torsion, PID. Not Ectopic. Doubt atypical ACS. Dispo: At the end of my shift patient is still pending a CT of the abdomen and pelvis with oral and IV contrast.  Care of this patient will be signed out to the oncoming physician at the end of my shift.  All pertinent patient information conveyed and all questions answered.  All further care and disposition decisions will be made by the oncoming physician.      ____________________________________________   FINAL CLINICAL IMPRESSION(S) / ED DIAGNOSES  Final diagnoses:  Abdominal pain, unspecified abdominal location  Constipation, unspecified constipation type     ED Discharge Orders    None       Note:  This document was prepared using Dragon voice recognition software and may include unintentional dictation errors.   Naaman Plummer, MD 07/16/20 [phone]

## 2020-07-15 NOTE — ED Triage Notes (Addendum)
Reports no BM X 6 days. Pt states that she has been having emesis with eating intermittently for last week. Does not have sensation of having BM. Pt reports abdominal pain as well. Hx of Chron's.

## 2020-07-15 NOTE — ED Notes (Signed)
Pt with constipation for x6 days. Pt reports she has had nausea/vomitting for a few days. Diffuse abdominal pain. Pt reports extensive bowel history, including crohn's and two bowel surgeries.   Pt reports she has tried an enema, laxatives, and stool softeners at home with no relief. Pt reports x3 emesis in past 24 hrs.

## 2020-10-09 ENCOUNTER — Emergency Department: Payer: 59

## 2020-10-09 ENCOUNTER — Other Ambulatory Visit: Payer: Self-pay

## 2020-10-09 ENCOUNTER — Emergency Department
Admission: EM | Admit: 2020-10-09 | Discharge: 2020-10-09 | Disposition: A | Discharge status: Home / Self Care | Payer: 59 | Source: Home / Self Care | Attending: Emergency Medicine | Admitting: Emergency Medicine

## 2020-10-09 DIAGNOSIS — I10 Essential (primary) hypertension: Secondary | ICD-10-CM | POA: Insufficient documentation

## 2020-10-09 DIAGNOSIS — K115 Sialolithiasis: Secondary | ICD-10-CM

## 2020-10-09 DIAGNOSIS — Z79899 Other long term (current) drug therapy: Secondary | ICD-10-CM | POA: Insufficient documentation

## 2020-10-09 DIAGNOSIS — Z7982 Long term (current) use of aspirin: Secondary | ICD-10-CM | POA: Insufficient documentation

## 2020-10-09 DIAGNOSIS — A419 Sepsis, unspecified organism: Secondary | ICD-10-CM | POA: Diagnosis not present

## 2020-10-09 DIAGNOSIS — Z8541 Personal history of malignant neoplasm of cervix uteri: Secondary | ICD-10-CM | POA: Insufficient documentation

## 2020-10-09 DIAGNOSIS — Z87891 Personal history of nicotine dependence: Secondary | ICD-10-CM | POA: Insufficient documentation

## 2020-10-09 LAB — CBC
HCT: 34 % — ABNORMAL LOW (ref 36.0–46.0)
Hemoglobin: 11 g/dL — ABNORMAL LOW (ref 12.0–15.0)
MCH: 30.1 pg (ref 26.0–34.0)
MCHC: 32.4 g/dL (ref 30.0–36.0)
MCV: 92.9 fL (ref 80.0–100.0)
Platelets: 222 10*3/uL (ref 150–400)
RBC: 3.66 MIL/uL — ABNORMAL LOW (ref 3.87–5.11)
RDW: 14.9 % (ref 11.5–15.5)
WBC: 14.1 10*3/uL — ABNORMAL HIGH (ref 4.0–10.5)
nRBC: 0 % (ref 0.0–0.2)

## 2020-10-09 LAB — BASIC METABOLIC PANEL
Anion gap: 10 (ref 5–15)
BUN: 8 mg/dL (ref 8–23)
CO2: 21 mmol/L — ABNORMAL LOW (ref 22–32)
Calcium: 8.7 mg/dL — ABNORMAL LOW (ref 8.9–10.3)
Chloride: 106 mmol/L (ref 98–111)
Creatinine, Ser: 0.81 mg/dL (ref 0.44–1.00)
GFR, Estimated: >60 mL/min (ref >60)
Glucose, Bld: 134 mg/dL — ABNORMAL HIGH (ref 70–99)
Potassium: 3.6 mmol/L (ref 3.5–5.1)
Sodium: 137 mmol/L (ref 135–145)

## 2020-10-09 MED ORDER — ONDANSETRON 4 MG PO TBDP: 4.0000 mg | ORAL_TABLET | Freq: Three times a day (TID) | ORAL | 0 refills | Status: DC | PRN

## 2020-10-09 MED ORDER — MORPHINE SULFATE (PF) 4 MG/ML IV SOLN
4.0000 mg | Freq: Once | INTRAVENOUS | Status: AC
  Administered 2020-10-09: 4 mg via INTRAVENOUS
  Filled 2020-10-09: qty 1

## 2020-10-09 MED ORDER — OXYCODONE-ACETAMINOPHEN 5-325 MG PO TABS: 1.0000 | ORAL_TABLET | ORAL | 0 refills | Status: AC | PRN

## 2020-10-09 MED ORDER — ONDANSETRON HCL 4 MG/2ML IJ SOLN
4.0000 mg | Freq: Once | INTRAMUSCULAR | Status: AC
  Administered 2020-10-09: 4 mg via INTRAVENOUS
  Filled 2020-10-09: qty 2

## 2020-10-09 MED ORDER — OXYCODONE-ACETAMINOPHEN 5-325 MG PO TABS
1.0000 | ORAL_TABLET | Freq: Once | ORAL | Status: AC
  Administered 2020-10-09: 1 via ORAL
  Filled 2020-10-09: qty 1

## 2020-10-09 MED ORDER — CEPHALEXIN 500 MG PO CAPS: 500.0000 mg | ORAL_CAPSULE | Freq: Four times a day (QID) | ORAL | 0 refills | Status: AC

## 2020-10-09 MED ORDER — IOHEXOL 300 MG/ML  SOLN
75.0000 mL | Freq: Once | INTRAMUSCULAR | Status: AC | PRN
  Administered 2020-10-09: 75 mL via INTRAVENOUS
  Filled 2020-10-09: qty 75

## 2020-10-09 NOTE — ED Provider Notes (Signed)
[facility] Emergency Department Provider Note  ____________________________________________   Event Date/Time   First MD Initiated Contact with Patient 10/09/20 2031     (approximate)  I have reviewed the triage vital signs and the nursing notes.   HISTORY  Chief Complaint Neck Swelling    HPI Sharon [name] is a 65 y.o. female presents emergency department complaining of neck swelling.  States she was seen at urgent care and they told her it was a salivary stone.  She states the area is gotten much larger and harder.  No difficulty breathing.  States she is very concerned and is not sure that this is a salivary stone.    Past Medical History:  Diagnosis Date  . Anxiety and depression   . Arthritis   . B12 deficiency    pernicious anemia - monthly b12 shots  . Cervical cancer ([place]) 12/2008   s/p hysterectomy  . Crohn's disease ([place]) 2000   h/o stenotic crohn's ileitis, active in TI s/p resections 2000, 2016 PPD neg ([facility] Dr. [name]); has been on cimzia, remicade, 6MP, entocort, now stable on Entyvio ([facility])  . Genital warts   . GERD (gastroesophageal reflux disease)    severe, daily sxs if off PPI  . Hepatic steatosis 04/2011   diffuse on CT, 35m gallbladder polyp  . History of anemia    attributed to crohn's  . History of chicken pox   . History of syphilis 1980s  . HTN (hypertension)   . Migraines    and frequent other headaches (sinus or stress)  . Scalp psoriasis   . Sensorineural hearing loss of both ears 12/2012   high freq  . Takotsubo syndrome 07/2012   cath 08/01/12, normal coronaries, LVEF 65%  . TIA (transient ischemic attack) [facility]2010   [facility] (w/u negative - carotids, echo, TC doppler, and MRI normal)  . Tobacco abuse     Patient Active Problem List   Diagnosis Date Noted  . Chronic insomnia 03/27/2018  . Obesity, Class I, BMI 30.0-34.9 (see actual BMI) 01/24/2018  . Hematuria  01/01/2016  . Hyperglycemia 05/09/2013  . HLD (hyperlipidemia) 02/12/2013  . Takotsubo syndrome   . Chest pain 08/02/2012  . Bilateral hearing loss 06/27/2012  . GERD (gastroesophageal reflux disease)   . History of pneumonia 01/18/2012  . Headache 10/13/2011  . Healthcare maintenance 08/16/2011  . Leukopenia 08/16/2011  . Vitamin B12 deficiency   . HTN (hypertension)   . Migraines   . Cro[place]'s disease ([place]   . Anxiety and depression   . Ex-smoker   . TIA (transient ischemic attack)   . Hepatic steatosis 04/30/2011    Past Surgical History:  Procedure Laterality Date  . abd [place] 04/2011   diffuse hepatic steatosis, 539mpolyp in gallbaldder fundus, no stones, mod abd ath  . APPENDECTOMY  1998  . CARDIAC CATHETERIZATION  07/2012   normal LV fxn, widely patent coronaries  . CERVICAL BIOPSY  W/ LOOP ELECTRODE EXCISION  10/2008  . COLON RESECTION  10/2014   removal [facility] scar tissue colon from [facility] . COLONOSCOPY  10/2008   ileo-colonic anastomosis, ielocolonic crohn's  . COLONOSCOPY  05/2011   ileo-colonic anastomosis normal, normal mucosa  . COLONOSCOPY  10/15/2012   focal inflammation at anastomosis, no active crohn's, planning MR enterograph ([facility] . COLONOSCOPY  09/2014   [facility]  . ESOPHAGOGASTRODUODENOSCOPY  05/2011   nl esophagus,  gastritis, nl duodenum  . hospitalization  05/2010   TIA vs complex migraine - w/u negative - head CT, MRI/MRA, carotid dopplers, echo.  treated with ASA and tramadol  . KNEE SURGERY Left   . LEFT HEART CATH AND CORONARY ANGIOGRAPHY Left 04/15/2020   Procedure: LEFT HEART CATH AND CORONARY ANGIOGRAPHY;  Surgeon: Yolonda Kida, MD;  Location: [facility];  Service: Cardiovascular;  Laterality: Left;  . LEFT HEART CATHETERIZATION WITH CORONARY ANGIOGRAM N/A 08/01/2012   Procedure: LEFT HEART CATHETERIZATION WITH CORONARY ANGIOGRAM;  Surgeon: Sherren Mocha, MD;  Location: [facility] CATH LAB;  Service: Cardiovascular;   Laterality: N/A;  . RIGHT COLECTOMY  2000   crohn's disease, ileocecal resection  . SHOULDER ARTHROSCOPY W/ ROTATOR CUFF REPAIR Right 05/31/2012   [facility] Tamera Punt)  . US ECHOCARDIOGRAPHY  05/2010   nl LV fxn ,EF 60%  . VAGINAL HYSTERECTOMY  12/2008   [facility]    Prior to Admission medications   Medication Sig Start Date End Date Taking? Authorizing Provider  cephALEXin (KEFLEX) 500 MG capsule Take 1 capsule (500 mg total) by mouth 4 (four) times daily for 7 days. 10/09/20 10/16/20 Yes Fisher, Linden Dolin, PA-C  ondansetron (ZOFRAN-ODT) 4 MG disintegrating tablet Take 1 tablet (4 mg total) by mouth every 8 (eight) hours as needed. 10/09/20  Yes Fisher, Linden Dolin, PA-C  oxyCODONE-acetaminophen (PERCOCET) 5-325 MG tablet Take 1 tablet by mouth every 4 (four) hours as needed for severe pain. 10/09/20 10/09/21 Yes Fisher, Linden Dolin, PA-C  acetaminophen (TYLENOL) 500 MG tablet Take 1,000-1,500 mg by mouth 2 (two) times daily as needed for moderate pain or headache.    [provider]  aspirin EC 81 MG tablet Take 1 tablet (81 mg total) by mouth daily. Over the counter 05/01/16   Rai, Vernelle Emerald, MD  Carboxymethylcellul-Glycerin (LUBRICATING EYE DROPS OP) Place 1 drop into both eyes daily as needed (dry eyes).    [provider]  diphenhydrAMINE (BENADRYL) 25 mg capsule Take 50 mg by mouth every 8 (eight) hours as needed for allergies.     [provider]  escitalopram (LEXAPRO) 20 MG tablet TAKE 1 TABLET BY MOUTH ONCE DAILY Patient taking differently: Take 20 mg by mouth daily.  09/11/18   Ria Bush, MD  Melatonin 10 MG CAPS Take 10 mg by mouth at bedtime.    [provider]  metoprolol tartrate (LOPRESSOR) 50 MG tablet TAKE 1 TABLET BY MOUTH TWICE DAILY Patient taking differently: Take 50 mg by mouth 2 (two) times daily.  07/04/18   Ria Bush, MD  nortriptyline (PAMELOR) 75 MG capsule Take 75 mg by mouth at bedtime.    [provider]   omeprazole (PRILOSEC) 20 MG capsule Take 20 mg by mouth daily.    [provider]  rosuvastatin (CRESTOR) 5 MG tablet Take 5 mg by mouth at bedtime.    [provider]  vedolizumab (ENTYVIO) 300 MG injection Inject into the vein every 8 (eight) weeks.     [provider]  vitamin B-12 (CYANOCOBALAMIN) 500 MCG tablet Take 500 mcg by mouth daily.    [provider]    Allergies Infliximab, Codeine, Flagyl [metronidazole], Hydrocodone, Ibuprofen, and Remeron [mirtazapine]  Family History  Problem Relation Age of Onset  . Crohn's disease Daughter        crohn's, ulcerative colitis  . Ulcerative colitis Daughter   . Stroke Mother   . Hypertension Mother   . Hyperlipidemia Mother   . Hypertension Father   .  Crohn's disease Father   . Crohn's disease Sister   . Cancer Sister        ovarian  . Crohn's disease Paternal Aunt   . Crohn's disease Paternal Uncle   . Diabetes Neg Hx     Social History Social History   Tobacco Use  . Smoking status: Former Smoker    Packs/day: 0.33    Years: 10.00    Pack years: 3.30    Types: Cigarettes    Quit date: 11/14/2016    Years since quitting: 3.9  . Smokeless tobacco: Never Used  . Tobacco comment: Quit within past yr  Vaping Use  . Vaping Use: Never used  Substance Use Topics  . Alcohol use: Yes    Comment: occ  . Drug use: No    Comment: extensive drug use, remotely    Review of Systems  Constitutional: No fever/chills Eyes: No visual changes. ENT: No sore throat.  Positive for throat/neck pain Respiratory: Denies cough Cardiovascular: Denies chest pain Gastrointestinal: Denies abdominal pain Genitourinary: Negative for dysuria. Musculoskeletal: Negative for back pain. Skin: Negative for rash. Psychiatric: no mood changes,     ____________________________________________   PHYSICAL EXAM:  VITAL SIGNS: ED Triage Vitals  Enc Vitals Group     BP 10/09/20 1854 (!) 101/52     Pulse  Rate 10/09/20 1854 (!) 51     Resp 10/09/20 1854 16     Temp 10/09/20 1854 98.3 F (36.8 C)     Temp Source 10/09/20 1854 Oral     SpO2 10/09/20 1854 96 %     Weight 10/09/20 1911 170 lb (77.1 kg)     Height 10/09/20 1911 5' 1"  (1.549 m)     Head Circumference --      Peak Flow --      Pain Score 10/09/20 1911 9     Pain Loc --      Pain Edu? --      Excl. in [place]? --     Constitutional: Alert and oriented. Well appearing and in no acute distress. Eyes: Conjunctivae are normal.  Head: Atraumatic. Nose: No congestion/rhinnorhea. Mouth/Throat: Mucous membranes are moist.  Neck:  supple no, very large hard area noted from the edge of the mandible down to the anterior of the neck, area is not mobile Cardiovascular: Normal rate, regular rhythm. Heart sounds are normal Respiratory: Normal respiratory effort.  No retractions, lungs c t a  GU: deferred Musculoskeletal: FROM all extremities, warm and well perfused Neurologic:  Normal speech and language.  Skin:  Skin is warm, dry and intact. No rash noted. Psychiatric: Mood and affect are normal. Speech and behavior are normal.  ____________________________________________   LABS (all labs ordered are listed, but only abnormal results are displayed)  Labs Reviewed  CBC - Abnormal; Notable for the following components:      Result Value   WBC 14.1 (*)    RBC 3.66 (*)    Hemoglobin 11.0 (*)    HCT 34.0 (*)    All other components within normal limits  BASIC METABOLIC PANEL - Abnormal; Notable for the following components:   CO2 21 (*)    Glucose, Bld 134 (*)    Calcium 8.7 (*)    All other components within normal limits   ____________________________________________   ____________________________________________  RADIOLOGY  CT soft tissue of the neck  ____________________________________________   PROCEDURES  Procedure(s) performed: No  Procedures    ____________________________________________   INITIAL  IMPRESSION / ASSESSMENT  AND PLAN / ED COURSE  Pertinent labs & imaging results that were available during my care of the patient were reviewed by me and considered in my medical decision making (see chart for details).   Patient is a 65 year old female presents emergency department with complaints of right-sided neck swelling and pain.  See HPI physical exam shows a hardened area.  DDx: Infected salivary stone, salivary stone, abscess, mass  CBC has elevated WBC of 97.3, basic metabolic panel has elevated glucose of 134, calcium is at 8.7  CT soft tissue of the neck   CT soft tissue of the neck shows 3 mm salivary stone.  See radiologist read  Explained findings to the patient.  We went over conservative measures such as massaging the gland, sucking on sour candy, using a straw to drink, patient was placed on antibiotic and pain medication.  She is to follow-up with [facility] if not improving in 2 days.  Return emergency department worsening.  States she understands.  She was discharged in stable condition.  Sharon [name] was evaluated in Emergency Department on 10/09/2020 for the symptoms described in the history of present illness. She was evaluated in the context of the global COVID-19 pandemic, which necessitated consideration that the patient might be at risk for infection with the SARS-CoV-2 virus that causes COVID-19. Institutional protocols and algorithms that pertain to the evaluation of patients at risk for COVID-19 are in a state of rapid change based on information released by regulatory bodies including the CDC and federal and state organizations. These policies and algorithms were followed during the patient's care in the ED.    As part of my medical decision making, I reviewed the following data within the [place] notes reviewed and incorporated, Labs reviewed , Old chart reviewed, Radiograph reviewed , Notes from prior ED visits and [place] Controlled  Substance Database  ____________________________________________   FINAL CLINICAL IMPRESSION(S) / ED DIAGNOSES  Final diagnoses:  Salivary duct stone      NEW MEDICATIONS STARTED DURING THIS VISIT:  Discharge Medication List as of 10/09/2020 10:49 PM    START taking these medications   Details  cephALEXin (KEFLEX) 500 MG capsule Take 1 capsule (500 mg total) by mouth 4 (four) times daily for 7 days., Starting Fri 10/09/2020, Until Fri 10/16/2020, Normal    ondansetron (ZOFRAN-ODT) 4 MG disintegrating tablet Take 1 tablet (4 mg total) by mouth every 8 (eight) hours as needed., Starting Fri 10/09/2020, Normal    oxyCODONE-acetaminophen (PERCOCET) 5-325 MG tablet Take 1 tablet by mouth every 4 (four) hours as needed for severe pain., Starting Fri 10/09/2020, Until Sat 10/09/2021 at 2359, Normal         Note:  This document was prepared using Dragon voice recognition software and may include unintentional dictation errors.    Versie Starks, PA-C 10/09/20 2309    Naaman Plummer, MD 10/09/20 [phone]

## 2020-10-09 NOTE — ED Notes (Addendum)
See triage note- swelling noted on right jaw/neck, pt reports tenderness.

## 2020-10-09 NOTE — ED Triage Notes (Signed)
Pt states that she was at urgent care and was diagnosed with a salivary stone to the right. Pt states UC sent her home with instructions to eat a lemon. Pt states overnight the swelling has gotten worse. Pt states she called her provider and advised she comes in for a CT.

## 2020-10-09 NOTE — Discharge Instructions (Addendum)
Continue to suck on sour candy, use a straw to drink with Massage the side of the neck affected by the stone Take the antibiotic as prescribed Take Tylenol for pain as needed Take Percocet for pain not controlled by Tylenol, Zofran for nausea associated with the narcotic Return to the emergency department if worsening

## 2020-10-10 ENCOUNTER — Other Ambulatory Visit: Payer: Self-pay

## 2020-10-10 ENCOUNTER — Emergency Department: Payer: 59

## 2020-10-10 ENCOUNTER — Inpatient Hospital Stay
Admission: EM | Admit: 2020-10-10 | Discharge: 2020-10-12 | DRG: 871 | Disposition: A | Discharge status: Home / Self Care | Payer: 59 | Attending: Internal Medicine | Admitting: Internal Medicine

## 2020-10-10 DIAGNOSIS — Z8249 Family history of ischemic heart disease and other diseases of the circulatory system: Secondary | ICD-10-CM

## 2020-10-10 DIAGNOSIS — Z9049 Acquired absence of other specified parts of digestive tract: Secondary | ICD-10-CM

## 2020-10-10 DIAGNOSIS — Z83438 Family history of other disorder of lipoprotein metabolism and other lipidemia: Secondary | ICD-10-CM

## 2020-10-10 DIAGNOSIS — Z87891 Personal history of nicotine dependence: Secondary | ICD-10-CM

## 2020-10-10 DIAGNOSIS — D510 Vitamin B12 deficiency anemia due to intrinsic factor deficiency: Secondary | ICD-10-CM | POA: Diagnosis present

## 2020-10-10 DIAGNOSIS — R220 Localized swelling, mass and lump, head: Secondary | ICD-10-CM

## 2020-10-10 DIAGNOSIS — R0602 Shortness of breath: Secondary | ICD-10-CM

## 2020-10-10 DIAGNOSIS — Z79899 Other long term (current) drug therapy: Secondary | ICD-10-CM

## 2020-10-10 DIAGNOSIS — K219 Gastro-esophageal reflux disease without esophagitis: Secondary | ICD-10-CM | POA: Diagnosis present

## 2020-10-10 DIAGNOSIS — K5090 Crohn's disease, unspecified, without complications: Secondary | ICD-10-CM | POA: Diagnosis present

## 2020-10-10 DIAGNOSIS — I10 Essential (primary) hypertension: Secondary | ICD-10-CM | POA: Diagnosis present

## 2020-10-10 DIAGNOSIS — F32A Depression, unspecified: Secondary | ICD-10-CM | POA: Diagnosis present

## 2020-10-10 DIAGNOSIS — L409 Psoriasis, unspecified: Secondary | ICD-10-CM | POA: Diagnosis present

## 2020-10-10 DIAGNOSIS — Z823 Family history of stroke: Secondary | ICD-10-CM

## 2020-10-10 DIAGNOSIS — K1120 Sialoadenitis, unspecified: Secondary | ICD-10-CM | POA: Diagnosis present

## 2020-10-10 DIAGNOSIS — A419 Sepsis, unspecified organism: Principal | ICD-10-CM

## 2020-10-10 DIAGNOSIS — Z7982 Long term (current) use of aspirin: Secondary | ICD-10-CM

## 2020-10-10 DIAGNOSIS — Z79890 Hormone replacement therapy (postmenopausal): Secondary | ICD-10-CM

## 2020-10-10 DIAGNOSIS — Z8541 Personal history of malignant neoplasm of cervix uteri: Secondary | ICD-10-CM

## 2020-10-10 DIAGNOSIS — K115 Sialolithiasis: Secondary | ICD-10-CM

## 2020-10-10 DIAGNOSIS — K122 Cellulitis and abscess of mouth: Secondary | ICD-10-CM | POA: Diagnosis present

## 2020-10-10 DIAGNOSIS — U071 COVID-19: Secondary | ICD-10-CM | POA: Diagnosis present

## 2020-10-10 DIAGNOSIS — F419 Anxiety disorder, unspecified: Secondary | ICD-10-CM | POA: Diagnosis present

## 2020-10-10 LAB — URINALYSIS, COMPLETE (UACMP) WITH MICROSCOPIC
Bacteria, UA: NONE SEEN
Bilirubin Urine: NEGATIVE
Glucose, UA: NEGATIVE mg/dL
Ketones, ur: NEGATIVE mg/dL
Nitrite: NEGATIVE
Protein, ur: NEGATIVE mg/dL
Specific Gravity, Urine: 1.032 — ABNORMAL HIGH (ref 1.005–1.030)
pH: 7 (ref 5.0–8.0)

## 2020-10-10 LAB — CBC WITH DIFFERENTIAL/PLATELET
Abs Immature Granulocytes: 0.07 10*3/uL (ref 0.00–0.07)
Basophils Absolute: 0.1 10*3/uL (ref 0.0–0.1)
Basophils Relative: 1 %
Eosinophils Absolute: 0.3 10*3/uL (ref 0.0–0.5)
Eosinophils Relative: 2 %
HCT: 35.9 % — ABNORMAL LOW (ref 36.0–46.0)
Hemoglobin: 11.4 g/dL — ABNORMAL LOW (ref 12.0–15.0)
Immature Granulocytes: 0 %
Lymphocytes Relative: 24 %
Lymphs Abs: 3.9 10*3/uL (ref 0.7–4.0)
MCH: 29.7 pg (ref 26.0–34.0)
MCHC: 31.8 g/dL (ref 30.0–36.0)
MCV: 93.5 fL (ref 80.0–100.0)
Monocytes Absolute: 1.1 10*3/uL — ABNORMAL HIGH (ref 0.1–1.0)
Monocytes Relative: 7 %
Neutro Abs: 11.1 10*3/uL — ABNORMAL HIGH (ref 1.7–7.7)
Neutrophils Relative %: 66 %
Platelets: 209 10*3/uL (ref 150–400)
RBC: 3.84 MIL/uL — ABNORMAL LOW (ref 3.87–5.11)
RDW: 14.8 % (ref 11.5–15.5)
WBC: 16.5 10*3/uL — ABNORMAL HIGH (ref 4.0–10.5)
nRBC: 0 % (ref 0.0–0.2)

## 2020-10-10 LAB — COMPREHENSIVE METABOLIC PANEL
ALT: 19 U/L (ref 0–44)
AST: 32 U/L (ref 15–41)
Albumin: 3.8 g/dL (ref 3.5–5.0)
Alkaline Phosphatase: 93 U/L (ref 38–126)
Anion gap: 7 (ref 5–15)
BUN: 6 mg/dL — ABNORMAL LOW (ref 8–23)
CO2: 28 mmol/L (ref 22–32)
Calcium: 8.6 mg/dL — ABNORMAL LOW (ref 8.9–10.3)
Chloride: 101 mmol/L (ref 98–111)
Creatinine, Ser: 0.83 mg/dL (ref 0.44–1.00)
GFR, Estimated: >60 mL/min (ref >60)
Glucose, Bld: 122 mg/dL — ABNORMAL HIGH (ref 70–99)
Potassium: 4.2 mmol/L (ref 3.5–5.1)
Sodium: 136 mmol/L (ref 135–145)
Total Bilirubin: 0.7 mg/dL (ref 0.3–1.2)
Total Protein: 7.8 g/dL (ref 6.5–8.1)

## 2020-10-10 LAB — LACTIC ACID, PLASMA
Lactic Acid, Venous: 1.3 mmol/L (ref 0.5–1.9)
Lactic Acid, Venous: 1.2 mmol/L (ref 0.5–1.9)

## 2020-10-10 LAB — PROTIME-INR
INR: 1.3 — ABNORMAL HIGH (ref 0.8–1.2)
Prothrombin Time: 15.2 seconds (ref 11.4–15.2)

## 2020-10-10 MED ORDER — SODIUM CHLORIDE 0.9 % IV BOLUS (SEPSIS)
1000.0000 mL | Freq: Once | INTRAVENOUS | Status: AC
  Administered 2020-10-10: 1000 mL via INTRAVENOUS

## 2020-10-10 MED ORDER — PANTOPRAZOLE SODIUM 40 MG PO TBEC
40.0000 mg | DELAYED_RELEASE_TABLET | Freq: Every day | ORAL | Status: DC
  Administered 2020-10-10 – 2020-10-12 (×3): 40 mg via ORAL
  Filled 2020-10-10 (×3): qty 1

## 2020-10-10 MED ORDER — TRAZODONE HCL 50 MG PO TABS: 25.0000 mg | ORAL_TABLET | Freq: Every evening | ORAL | Status: DC | PRN

## 2020-10-10 MED ORDER — ROSUVASTATIN CALCIUM 10 MG PO TABS
5.0000 mg | ORAL_TABLET | Freq: Every day | ORAL | Status: DC
  Administered 2020-10-10 – 2020-10-11 (×2): 5 mg via ORAL
  Filled 2020-10-10 (×3): qty 1

## 2020-10-10 MED ORDER — ONDANSETRON 4 MG PO TBDP
4.0000 mg | ORAL_TABLET | Freq: Three times a day (TID) | ORAL | Status: DC | PRN
  Administered 2020-10-11 – 2020-10-12 (×2): 4 mg via ORAL
  Filled 2020-10-10 (×4): qty 1

## 2020-10-10 MED ORDER — MELATONIN 5 MG PO TABS
10.0000 mg | ORAL_TABLET | Freq: Every day | ORAL | Status: DC
  Administered 2020-10-10 – 2020-10-11 (×2): 10 mg via ORAL
  Filled 2020-10-10 (×3): qty 2

## 2020-10-10 MED ORDER — CYANOCOBALAMIN 500 MCG PO TABS
500.0000 ug | ORAL_TABLET | Freq: Every day | ORAL | Status: DC
  Administered 2020-10-10 – 2020-10-12 (×2): 500 ug via ORAL
  Filled 2020-10-10 (×3): qty 1

## 2020-10-10 MED ORDER — PIPERACILLIN-TAZOBACTAM 3.375 G IVPB 30 MIN: 3.3750 g | Freq: Three times a day (TID) | INTRAVENOUS | Status: DC

## 2020-10-10 MED ORDER — ESCITALOPRAM OXALATE 10 MG PO TABS
20.0000 mg | ORAL_TABLET | Freq: Every day | ORAL | Status: DC
  Administered 2020-10-11 – 2020-10-12 (×2): 20 mg via ORAL
  Filled 2020-10-10 (×2): qty 2

## 2020-10-10 MED ORDER — HEPARIN SODIUM (PORCINE) 5000 UNIT/ML IJ SOLN
5000.0000 [IU] | Freq: Three times a day (TID) | INTRAMUSCULAR | Status: DC
  Administered 2020-10-10 – 2020-10-11 (×3): 5000 [IU] via SUBCUTANEOUS
  Filled 2020-10-10 (×3): qty 1

## 2020-10-10 MED ORDER — PIPERACILLIN-TAZOBACTAM 3.375 G IVPB
3.3750 g | Freq: Three times a day (TID) | INTRAVENOUS | Status: AC
  Administered 2020-10-11 (×2): 3.375 g via INTRAVENOUS
  Filled 2020-10-10 (×2): qty 50

## 2020-10-10 MED ORDER — HYDROMORPHONE HCL 1 MG/ML IJ SOLN
0.5000 mg | Freq: Once | INTRAMUSCULAR | Status: AC
  Administered 2020-10-10: 0.5 mg via INTRAVENOUS
  Filled 2020-10-10: qty 1

## 2020-10-10 MED ORDER — SODIUM CHLORIDE 0.45 % IV SOLN: INTRAVENOUS | Status: DC

## 2020-10-10 MED ORDER — SODIUM CHLORIDE 0.9 % IV BOLUS (SEPSIS)
500.0000 mL | Freq: Once | INTRAVENOUS | Status: AC
  Administered 2020-10-11: 500 mL via INTRAVENOUS

## 2020-10-10 MED ORDER — KETOROLAC TROMETHAMINE 30 MG/ML IJ SOLN
30.0000 mg | Freq: Four times a day (QID) | INTRAMUSCULAR | Status: DC | PRN
Start: 2020-10-10 — End: 2020-10-12
  Administered 2020-10-10 – 2020-10-12 (×4): 30 mg via INTRAVENOUS
  Filled 2020-10-10 (×4): qty 1

## 2020-10-10 MED ORDER — DEXAMETHASONE SODIUM PHOSPHATE 10 MG/ML IJ SOLN
10.0000 mg | Freq: Once | INTRAMUSCULAR | Status: AC
  Administered 2020-10-10: 10 mg via INTRAVENOUS
  Filled 2020-10-10: qty 1

## 2020-10-10 MED ORDER — METOPROLOL TARTRATE 50 MG PO TABS
50.0000 mg | ORAL_TABLET | Freq: Two times a day (BID) | ORAL | Status: DC
  Administered 2020-10-10 – 2020-10-11 (×3): 50 mg via ORAL
  Filled 2020-10-10 (×4): qty 1

## 2020-10-10 MED ORDER — IOHEXOL 300 MG/ML  SOLN
75.0000 mL | Freq: Once | INTRAMUSCULAR | Status: AC | PRN
  Administered 2020-10-10: 75 mL via INTRAVENOUS

## 2020-10-10 MED ORDER — NORTRIPTYLINE HCL 25 MG PO CAPS
75.0000 mg | ORAL_CAPSULE | Freq: Every day | ORAL | Status: DC
  Administered 2020-10-10 – 2020-10-11 (×2): 75 mg via ORAL
  Filled 2020-10-10 (×4): qty 3

## 2020-10-10 MED ORDER — VANCOMYCIN HCL 1750 MG/350ML IV SOLN
1750.0000 mg | Freq: Once | INTRAVENOUS | Status: AC
  Administered 2020-10-10: 1750 mg via INTRAVENOUS
  Filled 2020-10-10 (×2): qty 350

## 2020-10-10 MED ORDER — PIPERACILLIN-TAZOBACTAM 3.375 G IVPB 30 MIN
3.3750 g | Freq: Once | INTRAVENOUS | Status: AC
  Administered 2020-10-10: 3.375 g via INTRAVENOUS
  Filled 2020-10-10: qty 50

## 2020-10-10 MED ORDER — ACETAMINOPHEN 500 MG PO TABS
1000.0000 mg | ORAL_TABLET | Freq: Three times a day (TID) | ORAL | Status: DC | PRN
  Administered 2020-10-12: 1000 mg via ORAL
  Filled 2020-10-10: qty 2

## 2020-10-10 MED ORDER — ONDANSETRON HCL 4 MG/2ML IJ SOLN
4.0000 mg | Freq: Once | INTRAMUSCULAR | Status: AC
  Administered 2020-10-10: 4 mg via INTRAVENOUS
  Filled 2020-10-10: qty 2

## 2020-10-10 MED ORDER — ALPRAZOLAM 0.5 MG PO TABS
0.5000 mg | ORAL_TABLET | ORAL | Status: DC | PRN
  Administered 2020-10-10 – 2020-10-12 (×4): 0.5 mg via ORAL
  Filled 2020-10-10 (×4): qty 1

## 2020-10-10 MED ORDER — SENNA 8.6 MG PO TABS
1.0000 | ORAL_TABLET | Freq: Two times a day (BID) | ORAL | Status: DC
  Administered 2020-10-10 – 2020-10-12 (×4): 8.6 mg via ORAL
  Filled 2020-10-10 (×4): qty 1

## 2020-10-10 NOTE — Consult Note (Signed)
CODE SEPSIS - PHARMACY COMMUNICATION  **Broad Spectrum Antibiotics should be administered within 1 hour of Sepsis diagnosis**  Time Code Sepsis Called/Page Received: 1945  Antibiotics Ordered: 1945  Time of 1st antibiotic administration: 2034  Additional action taken by pharmacy: N/A  If necessary, Name of Provider/Nurse Contacted: N/A    Darnelle Bos ,PharmD Clinical Pharmacist  10/10/2020  7:52 PM

## 2020-10-10 NOTE — ED Notes (Addendum)
ENT MD at bedside at this time. Okay for pt to have clear liquids at this time per MD and MD Norins

## 2020-10-10 NOTE — Consult Note (Signed)
Sharon [name], Sharon [name] [id number] 07-16-1956 Sharon Rhymes, MD  Reason for Consult: Sialoadenitis  HPI: 65 y.o. female presents to ER with several day history of right sided facial and floor of mouth swelling.  She initially present to outside Urgent Care on 10/08/2020 and was told to use sialogogues.  Swelling worsened and she went to [facility] ER on 10/09/20 and she underwent CT evaluation and stone noted.  Overnight, the swelling has progressed even more and she returned to ER and underwent repeat CT scan.  She reports right sided neck pain and swelling.  No breathing issues.  Tolerating secretions.  No prior history of similar events.  Allergies:  Allergies  Allergen Reactions   Infliximab Dermatitis   Codeine Nausea And Vomiting   Flagyl [Metronidazole] Nausea And Vomiting   Hydrocodone Nausea And Vomiting   Ibuprofen Other (See Comments)    Pt states that she is unable to take due to her Crohn's disease.   Remeron [Mirtazapine] Other (See Comments)    Reaction:  Makes pt lethargic     ROS: Review of systems normal other than 12 systems except per HPI.  PMH:  Past Medical History:  Diagnosis Date   Anxiety and depression    Arthritis    B12 deficiency    pernicious anemia - monthly b12 shots   Cervical cancer ([place]) 12/2008   s/p hysterectomy   Crohn's disease ([place]) 2000   h/o stenotic crohn's ileitis, active in TI s/p resections 2000, 2016 PPD neg ([facility] Dr. [name]); has been on cimzia, remicade, 6MP, entocort, now stable on Entyvio ([facility])   Genital warts    GERD (gastroesophageal reflux disease)    severe, daily sxs if off PPI   Hepatic steatosis 04/2011   diffuse on CT, 74m gallbladder polyp   History of anemia    attributed to crohn's   History of chicken pox    History of syphilis 1980s   HTN (hypertension)    Migraines    and frequent other headaches (sinus or stress)   Scalp psoriasis    Sensorineural hearing loss of both ears  12/2012   high freq   Takotsubo syndrome 07/2012   cath 08/01/12, normal coronaries, LVEF 65%   T[facility] (transient ischemic attack) [facility]   [facility] (w/u negative - carotids, echo, TC doppler, and MRI normal)   Tobacco abuse     FH:  Family History  Problem Relation Age of Onset   Crohn's disease Daughter        crohn's, ulcerative colitis   Ulcerative colitis Daughter    Stroke Mother    Hypertension Mother    Hyperlipidemia Mother    Hypertension Father    Crohn's disease Father    Crohn's disease Sister    Cancer Sister        ovarian   Crohn's disease Paternal Aunt    Crohn's disease Paternal Uncle    Diabetes Neg Hx     SH:  Social History   Socioeconomic History   Marital status: Divorced    Spouse name: Not on file   Number of children: Not on file   Years of education: Not on file   Highest education level: Not on file  Occupational History   Not on file  Tobacco Use   Smoking status: Former Smoker    Packs/day: 0.33    Years: 10.00    Pack years: 3.30    Types: Cigarettes  Quit date: 11/14/2016    Years since quitting: 3.9   Smokeless tobacco: Never Used   Tobacco comment: Quit within past yr  Vaping Use   Vaping Use: Never used  Substance and Sexual Activity   Alcohol use: Yes    Comment: occ   Drug use: No    Comment: extensive drug use, remotely   Sexual activity: Not Currently    Birth control/protection: Surgical, Post-menopausal  Other Topics Concern   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   Edu: went back to school for psychology, considering CMA school   Activity: no regular activity   Diet:    Social Determinants of Health   Financial Resource Strain: Not on file  Food Insecurity:  Not on file  Transportation Needs: Not on file  Physical Activity: Not on file  Stress: Not on file  Social Connections: Not on file  Intimate Partner Violence: Not on file    PSH:  Past Surgical History:  Procedure Laterality Date   abd [place]  04/2011   diffuse hepatic steatosis, 52m polyp in gallbaldder fundus, no stones, mod abd ath   [place] 07[place]   normal LV fxn, widely patent coronaries   CERVICAL BIOPSY  W/ LOOP ELECTRODE EXCISION  10/2008   COLON RESECTION  10/2014   [facility]   COLONOSCOPY  10/2008   ileo-colonic anastomosis, ielocolonic crohn's   COLONOSCOPY  05/2011   ileo-colonic anastomosis normal, normal mucosa   COLONOSCOPY  10/15/2012   focal inflammation at anastomosis, no active crohn's, planning MR enterograph ([name]   COLONOSCOPY  09/2014   [facility]   ESOPHAGOGASTRODUODENOSCOPY  05/2011   nl esophagus, gastritis, nl duodenum   hospitalization  05/2010   TIA vs complex migraine - w/u negative - head CT, MRI/MRA, carotid dopplers, echo.  treated with ASA and tramadol   KNEE SURGERY Left    LEFT HEART CATH AND CORONARY ANGIOGRAPHY Left 04/15/2020   Procedure: LEFT HEART CATH AND CORONARY ANGIOGRAPHY;  Surgeon: CYolonda Kida MD;  [facility];  Service: Cardiovascular;  Laterality: Left;   LEFT HEART CATHETERIZATION WITH CORONARY ANGIOGRAM N/A 08/01/2012   Procedure: LEFT HEART CATHETERIZATION WITH CORONARY ANGIOGRAM;  Surgeon: MSherren Mocha MD;  [facility];  Service: Cardiovascular;  Laterality: N/A;   RIGHT COLECTOMY  2000   crohn's disease, ileocecal resection   SHOULDER ARTHROSCOPY W/ ROTATOR CUFF REPAIR Right 05/31/2012   [facility] (Tamera Punt   [place]  05/2010   nl LV fxn ,EF 60%   VAGINAL HYSTERECTOMY  12/2008   [facility]    Physical  Exam:  GEN- supine in bed in NAD NEURO- CN 2-12 grossly  intact and symmetric. EARS- external ears clear NOSE- clear anteriorly EYE- EOMI OC/OP- diffuse floor of mouth edema right greater than left.  Soft.  Mandibular tori as well with swelling below superior aspect of the mandibular tori.  No obvious stone palpated.  No purulence expressed from duct. NECK- tenderness and swelling of right submandibular gland. RESP- unlabored CARD-  RRR  CT 10/10/2020- Right sialolith in floor of mouth with right submandibular and moderate floor of mouth edema.  Enlarged duct.   A/P: Sialoadenitis, sialolithiasis, floor  of mouth cellulitis/edema.  Plan:  Discussed findings with patient regarding swelling on floor of mouth and stone and sialoadenitis.  Recommend steroids and aggressive coverage of staph/strep.  Instructed patient on massage of gland and sialogogues.  Patient tolerating secretions fine and no respiratory issues while supine, will re-evaluate in morning.     Carloyn Manner 10/10/2020 8:48 PM

## 2020-10-10 NOTE — ED Provider Notes (Signed)
[facility] Emergency Department Provider Note  ____________________________________________   Event Date/Time   First MD Initiated Contact with Patient 10/10/20 1931     (approximate)  I have reviewed the triage vital signs and the nursing notes.   HISTORY  Chief Complaint infection    HPI Sharon [name] is a 65 y.o. female with Crohn's disease who comes in with facial swelling.  Patient was seen here yesterday and diagnosed with salivary stone.  She went home on antibiotics but stated today that she started having fevers and the swelling got a lot worse.  The swelling was severe, constant, nothing made it better, nothing made it worse.  Tolerating her secretions but states it feels more difficult to swallow.           Past Medical History:  Diagnosis Date  . Anxiety and depression   . Arthritis   . B12 deficiency    pernicious anemia - monthly b12 shots  . Cervical cancer ([place]) 12/2008   s/p hysterectomy  . Crohn's disease ([place]) 2000   h/o stenotic crohn's ileitis, active in TI s/p resections 2000, 2016 PPD neg ([facility] Dr. [name]); has been on cimzia, remicade, 6MP, entocort, now stable on Entyvio ([facility])  . Genital warts   . GERD (gastroesophageal reflux disease)    severe, daily sxs if off PPI  . Hepatic steatosis 04/2011   diffuse on CT, 57m gallbladder polyp  . History of anemia    attributed to crohn's  . History of chicken pox   . History of syphilis 1980s  . HTN (hypertension)   . Migraines    and frequent other headaches (sinus or stress)  . Scalp psoriasis   . Sensorineural hearing loss of both ears 12/2012   high freq  . Takotsubo syndrome 07/2012   cath 08/01/12, normal coronaries, LVEF 65%  . TIA (transient ischemic attack) [facility]/2010   [facility] vs complex migraine (w/u negative - carotids, echo, TC doppler, and MRI normal)  . Tobacco abuse     Patient Active Problem List   Diagnosis Date Noted  .  Chronic insomnia 03/27/2018  . Obesity, Class I, BMI 30.0-34.9 (see actual BMI) 01/24/2018  . Hematuria 01/01/2016  . Hyperglycemia 05/09/2013  . HLD (hyperlipidemia) 02/12/2013  . Takotsubo syndrome   . Chest pain 08/02/2012  . Bilateral hearing loss 06/27/2012  . GERD (gastroesophageal reflux disease)   . History of pneumonia 01/18/2012  . Headache 10/13/2011  . Healthcare maintenance 08/16/2011  . Leukopenia 08/16/2011  . Vitamin B12 deficiency   . HTN (hypertension)   . Migraines   . Crohn's disease ([place]   . Anxiety and depression   . Ex-smoker   . TIA (transient ischemic attack)   . Hepatic steatosis 04/30/2011    Past Surgical History:  Procedure Laterality Date  . abd [place] 04/2011   diffuse hepatic steatosis, 520mpolyp in gallbaldder fundus, no stones, mod abd ath  . APPENDECTOMY  1998  . CARDIAC CATHETERIZATION  07/2012   normal LV fxn, widely patent coronaries  . CERVICAL BIOPSY  W/ LOOP ELECTRODE EXCISION  10/2008  . COLON RESECTION  10/2014   removal of scar tissue colon from [facility] . COLONOSCOPY  10/2008   ileo-colonic anastomosis, ielocolonic crohn's  . COLONOSCOPY  05/2011   ileo-colonic anastomosis normal, normal mucosa  . COLONOSCOPY  10/15/2012   focal inflammation at anastomosis, no active crohn's, planning MR enterograph (  [facility])  . COLONOSCOPY  09/2014   [facility]  . ESOPHAGOGASTRODUODENOSCOPY  05/2011   nl esophagus, gastritis, nl duodenum  . hospitalization  05/2010   TIA vs complex migraine - w/u negative - head CT, MRI/MRA, carotid dopplers, echo.  treated with ASA and tramadol  . KNEE SURGERY Left   . LEFT HEART CATH AND CORONARY ANGIOGRAPHY Left 04/15/2020   Procedure: LEFT HEART CATH AND CORONARY ANGIOGRAPHY;  Surgeon: Yolonda Kida, MD;  Location: [facility];  Service: Cardiovascular;  Laterality: Left;  . LEFT HEART CATHETERIZATION WITH CORONARY ANGIOGRAM N/A 08/01/2012   Procedure: LEFT HEART  CATHETERIZATION WITH CORONARY ANGIOGRAM;  Surgeon: Sherren Mocha, MD;  Location: [facility] CATH LAB;  Service: Cardiovascular;  Laterality: N/A;  . RIGHT COLECTOMY  2000   crohn's disease, ileocecal resection  . SHOULDER ARTHROSCOPY W/ ROTATOR CUFF REPAIR Right 05/31/2012   [facility] Tamera Punt)  . US ECHOCARDIOGRAPHY  05/2010   nl LV fxn ,EF 60%  . VAGINAL HYSTERECTOMY  12/2008   LAVH/BSO    Prior to Admission medications   Medication Sig Start Date End Date Taking? Authorizing Provider  acetaminophen (TYLENOL) 500 MG tablet Take 1,000-1,500 mg by mouth 2 (two) times daily as needed for moderate pain or headache.    [provider]  aspirin EC 81 MG tablet Take 1 tablet (81 mg total) by mouth daily. Over the counter 05/01/16   Rai, Vernelle Emerald, MD  Carboxymethylcellul-Glycerin (LUBRICATING EYE DROPS OP) Place 1 drop into both eyes daily as needed (dry eyes).    [provider]  cephALEXin (KEFLEX) 500 MG capsule Take 1 capsule (500 mg total) by mouth 4 (four) times daily for 7 days. 10/09/20 10/16/20  Fisher, Linden Dolin, PA-C  diphenhydrAMINE (BENADRYL) 25 mg capsule Take 50 mg by mouth every 8 (eight) hours as needed for allergies.     [provider]  escitalopram (LEXAPRO) 20 MG tablet TAKE 1 TABLET BY MOUTH ONCE DAILY Patient taking differently: Take 20 mg by mouth daily.  09/11/18   Ria Bush, MD  Melatonin 10 MG CAPS Take 10 mg by mouth at bedtime.    [provider]  metoprolol tartrate (LOPRESSOR) 50 MG tablet TAKE 1 TABLET BY MOUTH TWICE DAILY Patient taking differently: Take 50 mg by mouth 2 (two) times daily.  07/04/18   Ria Bush, MD  nortriptyline (PAMELOR) 75 MG capsule Take 75 mg by mouth at bedtime.    [provider]  omeprazole (PRILOSEC) 20 MG capsule Take 20 mg by mouth daily.    [provider]  ondansetron (ZOFRAN-ODT) 4 MG disintegrating tablet Take 1 tablet (4 mg total) by mouth every 8 (eight) hours as needed.  10/09/20   Fisher, Linden Dolin, PA-C  oxyCODONE-acetaminophen (PERCOCET) 5-325 MG tablet Take 1 tablet by mouth every 4 (four) hours as needed for severe pain. 10/09/20 10/09/21  Fisher, Linden Dolin, PA-C  rosuvastatin (CRESTOR) 5 MG tablet Take 5 mg by mouth at bedtime.    [provider]  vedolizumab (ENTYVIO) 300 MG injection Inject into the vein every 8 (eight) weeks.     [provider]  vitamin B-12 (CYANOCOBALAMIN) 500 MCG tablet Take 500 mcg by mouth daily.    [provider]    Allergies Infliximab, Codeine, Flagyl [metronidazole], Hydrocodone, Ibuprofen, and Remeron [mirtazapine]  Family History  Problem Relation Age of Onset  . Crohn's disease Daughter        crohn's, ulcerative colitis  . Ulcerative colitis Daughter   .  Stroke Mother   . Hypertension Mother   . Hyperlipidemia Mother   . Hypertension Father   . Crohn's disease Father   . Crohn's disease Sister   . Cancer Sister        ovarian  . Crohn's disease Paternal Aunt   . Crohn's disease Paternal Uncle   . Diabetes Neg Hx     Social History Social History   Tobacco Use  . Smoking status: Former Smoker    Packs/day: 0.33    Years: 10.00    Pack years: 3.30    Types: Cigarettes    Quit date: 11/14/2016    Years since quitting: 3.9  . Smokeless tobacco: Never Used  . Tobacco comment: Quit within past yr  Vaping Use  . Vaping Use: Never used  Substance Use Topics  . Alcohol use: Yes    Comment: occ  . Drug use: No    Comment: extensive drug use, remotely      Review of Systems Constitutional: Positive fevers Eyes: No visual changes. ENT:  Swelling on the right side of her face, difficulty swallowing Cardiovascular: Denies chest pain. Respiratory: Denies shortness of breath. Gastrointestinal: No abdominal pain.  No nausea, no vomiting.  No diarrhea.  No constipation. Genitourinary: Negative for dysuria. Musculoskeletal: Negative for back pain. Skin: Negative for  rash. Neurological: Negative for headaches, focal weakness or numbness. All other ROS negative ____________________________________________   PHYSICAL EXAM:  VITAL SIGNS: ED Triage Vitals  Enc Vitals Group     BP 10/10/20 1711 (!) 131/47     Pulse Rate 10/10/20 1711 69     Resp 10/10/20 1711 20     Temp 10/10/20 1711 (!) 100.5 F (38.1 C)     Temp Source 10/10/20 1711 Oral     SpO2 10/10/20 1711 98 %     Weight 10/10/20 1712 169 lb 12.1 oz (77 kg)     Height 10/10/20 1712 _0  (1.549 m)     Head Circumference --      Peak Flow --      Pain Score 10/10/20 1712 10     Pain Loc --      Pain Edu? --      Excl. in [place]? --     Constitutional: Alert and oriented. Well appearing and in no acute distress. Eyes: Conjunctivae are normal. EOMI. Head: Atraumatic. Nose: No congestion/rhinnorhea. Mouth/Throat: Swelling noted to the right side of her face with involvement underneath her mandibular with significant tenderness, redness, warmth.  Still able to open her mouth.  No drooling noted Neck: No stridor. Trachea Midline. FROM Cardiovascular: Normal rate, regular rhythm. Grossly normal heart sounds.  Good peripheral circulation. Respiratory: Normal respiratory effort.  No retractions. Lungs CTAB. Gastrointestinal: Soft and nontender. No distention. No abdominal bruits.  Musculoskeletal: No lower extremity tenderness nor edema.  No joint effusions. Neurologic:  Normal speech and language. No gross focal neurologic deficits are appreciated.  Skin:  Skin is warm, dry and intact. No rash noted. Psychiatric: Mood and affect are normal. Speech and behavior are normal. GU: Deferred   ____________________________________________   LABS (all labs ordered are listed, but only abnormal results are displayed)  Labs Reviewed  COMPREHENSIVE METABOLIC PANEL - Abnormal; Notable for the following components:      Result Value   Glucose, Bld 122 (*)    BUN 6 (*)    Calcium 8.6 (*)    All  other components within normal limits  CBC WITH DIFFERENTIAL/PLATELET - Abnormal; Notable  for the following components:   WBC 16.5 (*)    RBC 3.84 (*)    Hemoglobin 11.4 (*)    HCT 35.9 (*)    Neutro Abs 11.1 (*)    Monocytes Absolute 1.1 (*)    All other components within normal limits  PROTIME-INR - Abnormal; Notable for the following components:   INR 1.3 (*)    All other components within normal limits  CULTURE, BLOOD (ROUTINE X 2)  CULTURE, BLOOD (ROUTINE X 2)  SARS CORONAVIRUS 2 (TAT 6-24 HRS)  LACTIC ACID, PLASMA  LACTIC ACID, PLASMA  URINALYSIS, COMPLETE (UACMP) WITH MICROSCOPIC   ____________________________________________   RADIOLOGY Robert Bellow, personally viewed and evaluated these images (plain radiographs) as part of my medical decision making, as well as reviewing the written report by the radiologist.  ED MD interpretation: Worsening soft tissue swelling with stone noted  Official radiology report(s): CT Soft Tissue Neck W Contrast  Result Date: 10/09/2020 CLINICAL DATA:  Neck abscess, deep tissue. Additional history provided: Patient reports diagnosed with salivary stone at urgent care, swelling worsened overnight. EXAM: CT NECK WITH CONTRAST TECHNIQUE: Multidetector CT imaging of the neck was performed using the standard protocol following the bolus administration of intravenous contrast. CONTRAST:  38m OMNIPAQUE IOHEXOL 300 MG/ML  SOLN COMPARISON:  No pertinent prior exams available for comparison. FINDINGS: Pharynx and larynx: No definite inflammatory changes appreciated within the floor of mouth. No discrete mass is identified within the oral cavity, pharynx or larynx. No retropharyngeal collection. Salivary glands: Asymmetric prominence and hyperenhancement of the right submandibular gland with surrounding inflammatory stranding. Findings are compatible with sialoadenitis. There is a 3 mm sialolith within right floor of mouth within the right submandibular  duct (series 2, image 40) (series 6, image 34). There is no appreciable abscess. The bilateral parotid and left submandibular glands are unremarkable. Thyroid: The gland is small, but otherwise unremarkable. Lymph nodes: Left cervical chain lymph nodes measure subcentimeter in short axis, but are asymmetrically prominent, likely reactive. Vascular: The major vascular structures of the neck are patent. Calcified atherosclerotic plaque within the left carotid bifurcation and proximal ICA. Limited intracranial: No acute intracranial abnormality identified Visualized orbits: No mass or acute finding at the imaged levels. Mastoids and visualized paranasal sinuses: Mild bilateral maxillary sinus mucosal thickening. No significant mastoid effusion Skeleton: Cervical spondylosis. No acute bony abnormality or aggressive osseous lesion. Upper chest: No consolidation within the imaged lung apices. IMPRESSION: Asymmetric prominence and hyperenhancement of the right submandibular gland with surrounding inflammatory stranding. Findings are compatible with sialoadenitis. Associated 3 mm sialolith within the right floor of mouth (within the right submandibular duct). No abscess is identified. Right cervical lymph nodes measure subcentimeter in short axis, but are asymmetrically prominent, likely reactive. Electronically Signed   By: KKellie SimmeringDO   On: 10/09/2020 21:58   CT Maxillofacial W Contrast  Result Date: 10/10/2020 CLINICAL DATA:  Maxillary/facial abscess.  Neck CT 10/09/2020. EXAM: CT MAXILLOFACIAL WITH CONTRAST TECHNIQUE: Multidetector CT imaging of the maxillofacial structures was performed with intravenous contrast. Multiplanar CT image reconstructions were also generated. CONTRAST:  736mOMNIPAQUE IOHEXOL 300 MG/ML  SOLN COMPARISON:  No pertinent prior exams available for comparison. FINDINGS: Osseous: No acute bony abnormality or aggressive osseous lesion. No maxillofacial fracture. Orbits: No acute finding. The  globes are normal in size and contour. The extraocular muscles and optic nerve sheath complexes are symmetric and unremarkable. Sinuses: Trace mucosal thickening within the maxillary sinuses bilaterally. Soft tissues: Significantly progressed from  the neck CT performed one day prior, there is asymmetric prominence and hyperenhancement of the right submandibular gland with surrounding inflammatory stranding. Findings are compatible with sialoadenitis. New from the prior examination, there is dilation and hyperenhancement of the right submandibular duct within the gland and within the floor of mouth. The right submandibular duct now measures 5 mm in diameter. Redemonstrated 3 mm obstructing stone within the right submandibular duct within the floor of mouth. New from the prior exam, there is swelling and inflammatory stranding within the right floor of mouth suspicious for floor of mouth infection. No appreciable abscess. As before, right cervical chain lymph nodes measure subcentimeter in short axis but are asymmetrically prominent, likely reactive. Limited intracranial: No acute intracranial abnormality identified. These results were called by telephone at the time of interpretation on 10/10/2020 at 7:15 pm to provider [facility] , who verbally acknowledged these results. IMPRESSION: Since the CT neck performed one day prior 10/09/2020, there has been significant interval progression of findings of right submandibular gland sialoadenitis and surrounding inflammatory changes. The right submandibular duct is now dilated and hyperenhancing within the gland and within the right floor of mouth (measuring 5 mm in diameter). Unchanged position of an obstructing 3 mm stone within the right submandibular duct within the mid to anterior floor of mouth. New from the prior exam, there is soft tissue swelling and stranding within the right floor of mouth suspicious for secondary floor of mouth infection. No abscess is identified.  As before, right cervical chain lymph nodes are asymmetrically prominent, likely reactive. Electronically Signed   By: Kellie Simmering DO   On: 10/10/2020 19:23    ____________________________________________   PROCEDURES  Procedure(s) performed (including Critical Care):  .Critical Care Performed by: Vanessa [place], MD Authorized by: Vanessa [place], MD   Critical care provider statement:    Critical care time (minutes):  45   Critical care was necessary to treat or prevent imminent or life-threatening deterioration of the following conditions:  Sepsis   Critical care was time spent personally by me on the following activities:  Discussions with consultants, evaluation of patient's response to treatment, examination of patient, ordering and performing treatments and interventions, ordering and review of laboratory studies, ordering and review of radiographic studies, pulse oximetry, re-evaluation of patient's condition, obtaining history from patient or surrogate and review of old charts .1-3 Lead EKG Interpretation Performed by: Vanessa [place], MD Authorized by: Vanessa , MD     Interpretation: normal     ECG rate:  80s    ECG rate assessment: normal     Rhythm: sinus rhythm     Ectopy: none     Conduction: normal       ____________________________________________   INITIAL IMPRESSION / ASSESSMENT AND PLAN / ED COURSE  Sharon [name] was evaluated in Emergency Department on 10/10/2020 for the symptoms described in the history of present illness. She was evaluated in the context of the global COVID-19 pandemic, which necessitated consideration that the patient might be at risk for infection with the SARS-CoV-2 virus that causes COVID-19. Institutional protocols and algorithms that pertain to the evaluation of patients at risk for COVID-19 are in a state of rapid change based on information released by regulatory bodies including the CDC and federal and state organizations. These  policies and algorithms were followed during the patient's care in the ED.    Patient is a 65 year old who comes in with worsening facial swelling.  Will get repeat  CT scan to evaluate for abscess given her exam has clinically significantly change.  CT was ordered from the waiting room was noted to be positive with concern for infection of the floor of the mouth.  This makes me more concerned for ludwig picture due to the stone.  Patient's white count came back elevated and patient was febrile therefore patient met SIRS criteria.  I had patient taken out of the waiting room and immediately moved into a hallway bed after CT resulted.  Will get patient fluid resuscitated and start on broad-spectrum antibiotics.  Did discuss with the ENT doctor Dr. Pryor Ochoa who recommended steroids, antibiotics and they will see patient in the morning.  At this time I have low suspicion for other causes of sepsis such as pneumonia, UTI, abdominal process, Covid.  We will keep patient in the cardiac monitor and continue to closely watch her airway and discussed the hospital team for admission.       ____________________________________________   FINAL CLINICAL IMPRESSION(S) / ED DIAGNOSES   Final diagnoses:  Sepsis, due to unspecified organism, unspecified whether acute organ dysfunction present ([place])  Salivary stone  Facial swelling      MEDICATIONS GIVEN DURING THIS VISIT:  Medications  dexamethasone (DECADRON) injection 10 mg (has no administration in time range)  sodium chloride 0.9 % bolus 1,000 mL (has no administration in time range)    And  sodium chloride 0.9 % bolus 1,000 mL (has no administration in time range)    And  sodium chloride 0.9 % bolus 500 mL (has no administration in time range)  piperacillin-tazobactam (ZOSYN) IVPB 3.375 g (has no administration in time range)  HYDROmorphone (DILAUDID) injection 0.5 mg (has no administration in time range)  ondansetron (ZOFRAN) injection 4 mg (has  no administration in time range)  iohexol (OMNIPAQUE) 300 MG/ML solution 75 mL (75 mLs Intravenous Contrast Given 10/10/20 1826)     ED Discharge Orders    None       Note:  This document was prepared using Dragon voice recognition software and may include unintentional dictation errors.   Vanessa [place], MD 10/10/20 1950

## 2020-10-10 NOTE — H&P (Signed)
History and Physical    Sharon [name] CXK:[id number] DOB: 1955-10-19 DOA: 10/10/2020  PCP: Zeb Comfort, MD (Confirm with patient/family/NH records and if not entered, this has to be entered at [facility] point of entry) Patient coming from: home  I have personally briefly reviewed patient's old medical records in [place]  Chief Complaint: facial swelling, known submandibular duct stone  HPI: Sharon [name] is a 65 y.o. female with medical history significant of anxiety, Crohn's, GERD, HTN was seen as outpatient 10/08/20 diagnosed with sialadenitis and was treated conservatively. She developed increased swelling and pain and presented to [facility] 10/09/20 where CT confirmed 39m stone in submandibular salivary duct with standing and edema. She was started on oral abx. Today she had increased pain and selling and now fever. She presents for re-evaluation to [facility].  ED Course: T 100.5  131/47  HR 86  RR 20 EDP exam notable for facial swelling, airway clear. Lab: Cmet with glucose 122, lactic acid 1.5, WBC 16.5 with 64/24/7, Hgb 11.4. Repeat CT (had study 10/09/20) reveals persistent right submandibular 344mstone, increase soft tissue swelling and stranding suggestive of infection. Code sepsis called 2/2 fever, leukocytosis, elevated lactic acid. Patient resuscitated sith 1.5 L NS, Zosyn and Vancomycin administered. She was given 10 mg decadron IV. Dr. VaPryor OchoaENT, consulted who recommended continuing IV abx and steroids. TRH called to admit to continue treatment.   Review of Systems: As per HPI otherwise 10 point review of systems negative.    Past Medical History:  Diagnosis Date  . Anxiety and depression   . Arthritis   . B12 deficiency    pernicious anemia - monthly b12 shots  . [place]   s/p [place]  . [place] disease ([place]   h/o stenotic crohn's ileitis, active in TI s/p resections 2000, 2016 PPD neg ([facility] Dr. EdOletta Lamas has been on cimzia,  remicade, 6MP, entocort, now stable on Entyvio ([facility] . Genital warts   . GERD (gastroesophageal reflux disease)    severe, daily sxs if off PPI  . Hepatic steatosis 04/2011   diffuse on CT, 51m33mallbladder polyp  . History of anemia    attributed to crohn's  . History of chicken pox   . History of syphilis 1980s  . HTN (hypertension)   . Migraines    and frequent other headaches (sinus or stress)  . Scalp psoriasis   . Sensorineural hearing loss of both ears 12/2012   high freq  . Takotsubo syndrome 07/2012   cath 08/01/12, normal coronaries, LVEF 65%  . TIA (transient ischemic attack) [facility]2010   [facility] (w/u negative - carotids, echo, TC doppler, and MRI normal)  . Tobacco abuse     Past Surgical History:  Procedure Laterality Date  . abd [place]/2012   diffuse hepatic steatosis, 51mm74mlyp in gallbaldder fundus, no stones, mod abd ath  . APPENDECTOMY  1998  . CARDIAC CATHETERIZATION  07/2012   normal LV fxn, widely patent coronaries  . CERVICAL BIOPSY  W/ LOOP ELECTRODE EXCISION  10/2008  . COLON RESECTION  10/2014   removal of scar tissue colon from prior surgery ([facility] COLONOSCOPY  10/2008   ileo-colonic anastomosis, ielocolonic crohn's  . COLONOSCOPY  05/2011   ileo-colonic anastomosis normal, normal mucosa  . COLONOSCOPY  10/15/2012   focal inflammation at anastomosis, no active crohn's, planning MR enterograph (EdwOletta Lamas COLONOSCOPY  09/2014   [place]  . ESOPHAGOGASTRODUODENOSCOPY  05/2011   nl esophagus, gastritis, nl duodenum  . hospitalization  05/2010   TIA vs complex migraine - w/u negative - head CT, MRI/MRA, carotid dopplers, echo.  treated with ASA and tramadol  . KNEE SURGERY Left   . LEFT HEART CATH AND CORONARY ANGIOGRAPHY Left 04/15/2020   Procedure: LEFT HEART CATH AND CORONARY ANGIOGRAPHY;  Surgeon: Yolonda Kida, MD;  Location: [place] [facility];  Service: Cardiovascular;  Laterality: Left;  .  LEFT HEART CATHETERIZATION WITH CORONARY ANGIOGRAM N/A 08/01/2012   Procedure: LEFT HEART CATHETERIZATION WITH CORONARY ANGIOGRAM;  Surgeon: Sherren Mocha, MD;  Location: [facility] CATH LAB;  Service: Cardiovascular;  Laterality: N/A;  . RIGHT COLECTOMY  2000   crohn's disease, ileocecal resection  . SHOULDER ARTHROSCOPY W/ ROTATOR CUFF REPAIR Right 05/31/2012   [facility] Tamera Punt)  . US ECHOCARDIOGRAPHY  05/2010   nl LV fxn ,EF 60%  . VAGINAL HYSTERECTOMY  12/2008   LAVH/BSO    Soc Hx - married then divorced. Raised 3 children: 2 daughters, 1 son. She has 8 grandchildren and 1 great-grand. She works as a Surveyor, minerals at [employer]. She lives alone with her dog and cat.    reports that she quit smoking about 3 years ago. Her smoking use included cigarettes. She has a 3.30 pack-year smoking history. She has never used smokeless tobacco. She reports current alcohol use. She reports that she does not use drugs.  Allergies  Allergen Reactions  . Infliximab Dermatitis  . Codeine Nausea And Vomiting  . Flagyl [Metronidazole] Nausea And Vomiting  . Hydrocodone Nausea And Vomiting  . Ibuprofen Other (See Comments)    Pt states that she is unable to take due to her Crohn's disease.  Marland Kitchen Remeron [Mirtazapine] Other (See Comments)    Reaction:  Makes pt lethargic     Family History  Problem Relation Age of Onset  . Crohn's disease Daughter        crohn's, ulcerative colitis  . Ulcerative colitis Daughter   . Stroke Mother   . Hypertension Mother   . Hyperlipidemia Mother   . Hypertension Father   . Crohn's disease Father   . Crohn's disease Sister   . Cancer Sister        ovarian  . Crohn's disease Paternal Aunt   . Crohn's disease Paternal Uncle   . Diabetes Neg Hx      Prior to Admission medications   Medication Sig Start Date End Date Taking? Authorizing Provider  acetaminophen (TYLENOL) 500 MG tablet Take 1,000-1,500 mg by mouth 2 (two) times daily as needed for  moderate pain or headache.    [provider]  aspirin EC 81 MG tablet Take 1 tablet (81 mg total) by mouth daily. Over the counter 05/01/16   Rai, Vernelle Emerald, MD  Carboxymethylcellul-Glycerin (LUBRICATING EYE DROPS OP) Place 1 drop into both eyes daily as needed (dry eyes).    [provider]  cephALEXin (KEFLEX) 500 MG capsule Take 1 capsule (500 mg total) by mouth 4 (four) times daily for 7 days. 10/09/20 10/16/20  Fisher, Linden Dolin, PA-C  diphenhydrAMINE (BENADRYL) 25 mg capsule Take 50 mg by mouth every 8 (eight) hours as needed for allergies.     [provider]  escitalopram (LEXAPRO) 20 MG tablet TAKE 1 TABLET BY MOUTH ONCE DAILY Patient taking differently: Take 20 mg by mouth daily.  09/11/18   Ria Bush, MD  Melatonin 10 MG  CAPS Take 10 mg by mouth at bedtime.    [provider]  metoprolol tartrate (LOPRESSOR) 50 MG tablet TAKE 1 TABLET BY MOUTH TWICE DAILY Patient taking differently: Take 50 mg by mouth 2 (two) times daily.  07/04/18   Ria Bush, MD  nortriptyline (PAMELOR) 75 MG capsule Take 75 mg by mouth at bedtime.    [provider]  omeprazole (PRILOSEC) 20 MG capsule Take 20 mg by mouth daily.    [provider]  ondansetron (ZOFRAN-ODT) 4 MG disintegrating tablet Take 1 tablet (4 mg total) by mouth every 8 (eight) hours as needed. 10/09/20   Fisher, Linden Dolin, PA-C  oxyCODONE-acetaminophen (PERCOCET) 5-325 MG tablet Take 1 tablet by mouth every 4 (four) hours as needed for severe pain. 10/09/20 10/09/21  Fisher, Linden Dolin, PA-C  rosuvastatin (CRESTOR) 5 MG tablet Take 5 mg by mouth at bedtime.    [provider]  vedolizumab (ENTYVIO) 300 MG injection Inject into the vein every 8 (eight) weeks.     [provider]  vitamin B-12 (CYANOCOBALAMIN) 500 MCG tablet Take 500 mcg by mouth daily.    [provider]    Physical Exam: Vitals:   10/10/20 1711 10/10/20 1712 10/10/20 1929  BP: (!) 131/47   124/84  Pulse: 69  86  Resp: 20    Temp: (!) 100.5 F (38.1 C)  98.4 F (36.9 C)  TempSrc: Oral    SpO2: 98%  100%  Weight:  77 kg   Height:  5' 1"  (1.549 m)      Vitals:   10/10/20 1711 10/10/20 1712 10/10/20 1929  BP: (!) 131/47  124/84  Pulse: 69  86  Resp: 20    Temp: (!) 100.5 F (38.1 C)  98.4 F (36.9 C)  TempSrc: Oral    SpO2: 98%  100%  Weight:  77 kg   Height:  5' 1"  (1.549 m)    General: overweight woman in no distress, handling her secretions well.  Eyes: PERRL, lids and conjunctivae normal ENMT: Mucous membranes are moist. Oral exam deferred to Dr. Pryor Ochoa - floor of mouth soft, posterior pharynx clear). Neck: marked swelling and induration right neck/jaw line with erythema and induration, ,  Mass at right jaw lline, no thyromegaly Respiratory: clear to auscultation bilaterally, no wheezing, no crackles. Normal respiratory effort. No accessory muscle use.  Cardiovascular: Regular rate and rhythm, no murmurs / rubs / gallops. No extremity edema. 2+ pedal pulses. No carotid bruits.  Abdomen: obese,  no tenderness, no masses palpated. No hepatosplenomegaly. Bowel sounds positive.  Musculoskeletal: no clubbing / cyanosis. No joint deformity upper and lower extremities. Good ROM, no contractures. Normal muscle tone.  Skin: no rashes, lesions, ulcers. No induration Neurologic: CN 2-12 grossly intact. Sensation intact, DTR normal. Strength 5/5 in all 4.  Psychiatric: Normal judgment and insight. Alert and oriented x 3. Normal mood.     Labs on Admission: I have personally reviewed following labs and imaging studies  CBC: Recent Labs  Lab 10/09/20 1921 10/10/20 1717  WBC 14.1* 16.5*  NEUTROABS  --  11.1*  HGB 11.0* 11.4*  HCT 34.0* 35.9*  MCV 92.9 93.5  PLT 222 573   Basic Metabolic Panel: Recent Labs  Lab 10/09/20 1921 10/10/20 1717  NA 137 136  K 3.6 4.2  CL 106 101  CO2 21* 28  GLUCOSE 134* 122*  BUN 8 6*  CREATININE 0.81 0.83  CALCIUM 8.7*  8.6*   GFR: Estimated Creatinine Clearance: 66  mL/min (by C-G formula based on SCr of 0.83 mg/dL). Liver Function Tests: Recent Labs  Lab 10/10/20 1717  AST 32  ALT 19  ALKPHOS 93  BILITOT 0.7  PROT 7.8  ALBUMIN 3.8   No results for input(s): LIPASE, AMYLASE in the last 168 hours. No results for input(s): AMMONIA in the last 168 hours. Coagulation Profile: Recent Labs  Lab 10/10/20 1717  INR 1.3*   Cardiac Enzymes: No results for input(s): CKTOTAL, CKMB, CKMBINDEX, TROPONINI in the last 168 hours. BNP (last 3 results) No results for input(s): PROBNP in the last 8760 hours. HbA1C: No results for input(s): HGBA1C in the last 72 hours. CBG: No results for input(s): GLUCAP in the last 168 hours. Lipid Profile: No results for input(s): CHOL, HDL, LDLCALC, TRIG, CHOLHDL, LDLDIRECT in the last 72 hours. Thyroid Function Tests: No results for input(s): TSH, T4TOTAL, FREET4, T3FREE, THYROIDAB in the last 72 hours. Anemia Panel: No results for input(s): VITAMINB12, FOLATE, FERRITIN, TIBC, IRON, RETICCTPCT in the last 72 hours. Urine analysis:    Component Value Date/Time   COLORURINE YELLOW (A) 07/15/2020 1320   APPEARANCEUR CLEAR (A) 07/15/2020 1320   LABSPEC 1.005 07/15/2020 1320   PHURINE 6.0 07/15/2020 1320   GLUCOSEU NEGATIVE 07/15/2020 1320   HGBUR SMALL (A) 07/15/2020 1320   BILIRUBINUR NEGATIVE 07/15/2020 1320   BILIRUBINUR neg 01/01/2016 1200   KETONESUR NEGATIVE 07/15/2020 1320   PROTEINUR NEGATIVE 07/15/2020 1320   UROBILINOGEN 0.2 01/01/2016 1200   UROBILINOGEN 0.2 07/15/2014 1800   NITRITE NEGATIVE 07/15/2020 1320   LEUKOCYTESUR TRACE (A) 07/15/2020 1320    Radiological Exams on Admission: CT Soft Tissue Neck W Contrast  Result Date: 10/09/2020 CLINICAL DATA:  Neck abscess, deep tissue. Additional history provided: Patient reports diagnosed with salivary stone at urgent care, swelling worsened overnight. EXAM: CT NECK WITH CONTRAST TECHNIQUE:  Multidetector CT imaging of the neck was performed using the standard protocol following the bolus administration of intravenous contrast. CONTRAST:  70m OMNIPAQUE IOHEXOL 300 MG/ML  SOLN COMPARISON:  No pertinent prior exams available for comparison. FINDINGS: Pharynx and larynx: No definite inflammatory changes appreciated within the floor of mouth. No discrete mass is identified within the oral cavity, pharynx or larynx. No retropharyngeal collection. Salivary glands: Asymmetric prominence and hyperenhancement of the right submandibular gland with surrounding inflammatory stranding. Findings are compatible with sialoadenitis. There is a 3 mm sialolith within right floor of mouth within the right submandibular duct (series 2, image 40) (series 6, image 34). There is no appreciable abscess. The bilateral parotid and left submandibular glands are unremarkable. Thyroid: The gland is small, but otherwise unremarkable. Lymph nodes: Left cervical chain lymph nodes measure subcentimeter in short axis, but are asymmetrically prominent, likely reactive. Vascular: The major vascular structures of the neck are patent. Calcified atherosclerotic plaque within the left carotid bifurcation and proximal ICA. Limited intracranial: No acute intracranial abnormality identified Visualized orbits: No mass or acute finding at the imaged levels. Mastoids and visualized paranasal sinuses: Mild bilateral maxillary sinus mucosal thickening. No significant mastoid effusion Skeleton: Cervical spondylosis. No acute bony abnormality or aggressive osseous lesion. Upper chest: No consolidation within the imaged lung apices. IMPRESSION: Asymmetric prominence and hyperenhancement of the right submandibular gland with surrounding inflammatory stranding. Findings are compatible with sialoadenitis. Associated 3 mm sialolith within the right floor of mouth (within the right submandibular duct). No abscess is identified. Right cervical lymph nodes  measure subcentimeter in short axis, but are asymmetrically prominent, likely reactive. Electronically Signed   By:  Kellie Simmering DO   On: 10/09/2020 21:58   CT Maxillofacial W Contrast  Result Date: 10/10/2020 CLINICAL DATA:  Maxillary/facial abscess.  Neck CT 10/09/2020. EXAM: CT MAXILLOFACIAL WITH CONTRAST TECHNIQUE: Multidetector CT imaging of the maxillofacial structures was performed with intravenous contrast. Multiplanar CT image reconstructions were also generated. CONTRAST:  19m OMNIPAQUE IOHEXOL 300 MG/ML  SOLN COMPARISON:  No pertinent prior exams available for comparison. FINDINGS: Osseous: No acute bony abnormality or aggressive osseous lesion. No maxillofacial fracture. Orbits: No acute finding. The globes are normal in size and contour. The extraocular muscles and optic nerve sheath complexes are symmetric and unremarkable. Sinuses: Trace mucosal thickening within the maxillary sinuses bilaterally. Soft tissues: Significantly progressed from the neck CT performed one day prior, there is asymmetric prominence and hyperenhancement of the right submandibular gland with surrounding inflammatory stranding. Findings are compatible with sialoadenitis. New from the prior examination, there is dilation and hyperenhancement of the right submandibular duct within the gland and within the floor of mouth. The right submandibular duct now measures 5 mm in diameter. Redemonstrated 3 mm obstructing stone within the right submandibular duct within the floor of mouth. New from the prior exam, there is swelling and inflammatory stranding within the right floor of mouth suspicious for floor of mouth infection. No appreciable abscess. As before, right cervical chain lymph nodes measure subcentimeter in short axis but are asymmetrically prominent, likely reactive. Limited intracranial: No acute intracranial abnormality identified. These results were called by telephone at the time of interpretation on 10/10/2020 at 7:15  pm [facility], who verbally acknowledged these results. IMPRESSION: Since the CT neck performed one day prior 10/09/2020, there has been significant interval progression of findings of right submandibular gland sialoadenitis and surrounding inflammatory changes. The right submandibular duct is now dilated and hyperenhancing within the gland and within the right floor of mouth (measuring 5 mm in diameter). Unchanged position of an obstructing 3 mm stone within the right submandibular duct within the mid to anterior floor of mouth. New from the prior exam, there is soft tissue swelling and stranding within the right floor of mouth suspicious for secondary floor of mouth infection. No abscess is identified. As before, right cervical chain lymph nodes are asymmetrically prominent, likely reactive. Electronically Signed   By: KKellie SimmeringDO   On: 10/10/2020 19:23    EKG: Independently reviewed. No EKG in ED  Assessment/Plan Active Problems:   Sialadenitis   Sepsis (HCC)   HTN (hypertension)   Crohn's disease (HCC)   GERD (gastroesophageal reflux disease)    1, Sialadenitis - right submandibular duct - 348mstone with edema, stranding in soft tisse. Patient report odynophagia but can swallow, no stridor. Dr. VaPryor Ochoaas seen and evaluated the patient. She has received Abx and decadron Plan Continue Zosyn x 24 hrs then if stone passes or is removed switch to augmentin  Prednisone 40 mg bid starting in AM until stone passes or is removed  Ketorolac 30 mg IV q6 for pain  2. Sepsis - 2/2 to #1 with fever, leukocytosis, lacticacidosis. She has received 1.5 L NS and had Abx on board. No end organ damage, no hypotension.  3. HTN- continue home meds  4. Crohn's - stable  5. GERD - continue PPI  6. Anxiety/depression - continue home meds Plan For steroid related anxiety Xanax 0.5 mg q4 prn     DVT prophylaxis: heparin SQ - may require surgery  Code Status: full code  Family  Communication:  spoke with Lynnea Maizes, daughter, explained Dx and Tx plan. She had no questions.   Disposition Plan: home 24-48 hours  Consults called: ENT- Dr. Pryor Ochoa  Admission status: obs    Adella Hare MD [facility] Pager [phone]  If 7PM-7AM, please contact night-coverage [email or web address] Password [facility]  10/10/2020, 8:19 PM

## 2020-10-10 NOTE — ED Triage Notes (Signed)
Pt to ED POV for chief complaint of salivary stone that she states has gotten worse since yesterday. Reports increased facial swelling and fever with more difficulty swallowing.  Pt will not cooperative with tongue blade and light to see if throat swollen Pt reports she has taken tylenol PTA  Discussed pt with Dr Jari Pigg

## 2020-10-10 NOTE — Consult Note (Signed)
PHARMACY -  BRIEF ANTIBIOTIC NOTE   Pharmacy has received consult(s) for vancomycin from an ED provider. The patient's profile has been reviewed for ht/wt/allergies/indication/available labs.    One time order(s) placed for vancomycin 1750 mg IV  Further antibiotics/pharmacy consults should be ordered by admitting physician if indicated.                       Thank you, Darnelle Bos, PharmD 10/10/2020  7:57 PM

## 2020-10-10 NOTE — ED Notes (Signed)
Called pharmacy and spoke to [place] regarding vanc for patient. States she tubed it up. Medication not found at tube station, pyxis, or med cabinet. States she will send again.

## 2020-10-10 NOTE — ED Notes (Signed)
Multiple RN attempt to start successful IV. IV team consult placed

## 2020-10-11 ENCOUNTER — Encounter: Payer: Self-pay | Admitting: Internal Medicine

## 2020-10-11 ENCOUNTER — Observation Stay: Payer: 59

## 2020-10-11 DIAGNOSIS — U071 COVID-19: Secondary | ICD-10-CM | POA: Diagnosis present

## 2020-10-11 DIAGNOSIS — Z8249 Family history of ischemic heart disease and other diseases of the circulatory system: Secondary | ICD-10-CM | POA: Diagnosis not present

## 2020-10-11 DIAGNOSIS — Z8541 Personal history of malignant neoplasm of cervix uteri: Secondary | ICD-10-CM | POA: Diagnosis not present

## 2020-10-11 DIAGNOSIS — A419 Sepsis, unspecified organism: Principal | ICD-10-CM

## 2020-10-11 DIAGNOSIS — D510 Vitamin B12 deficiency anemia due to intrinsic factor deficiency: Secondary | ICD-10-CM | POA: Diagnosis present

## 2020-10-11 DIAGNOSIS — Z823 Family history of stroke: Secondary | ICD-10-CM | POA: Diagnosis not present

## 2020-10-11 DIAGNOSIS — K2100 Gastro-esophageal reflux disease with esophagitis, without bleeding: Secondary | ICD-10-CM

## 2020-10-11 DIAGNOSIS — R220 Localized swelling, mass and lump, head: Secondary | ICD-10-CM

## 2020-10-11 DIAGNOSIS — Z7982 Long term (current) use of aspirin: Secondary | ICD-10-CM | POA: Diagnosis not present

## 2020-10-11 DIAGNOSIS — F419 Anxiety disorder, unspecified: Secondary | ICD-10-CM | POA: Diagnosis present

## 2020-10-11 DIAGNOSIS — K122 Cellulitis and abscess of mouth: Secondary | ICD-10-CM | POA: Diagnosis present

## 2020-10-11 DIAGNOSIS — Z83438 Family history of other disorder of lipoprotein metabolism and other lipidemia: Secondary | ICD-10-CM | POA: Diagnosis not present

## 2020-10-11 DIAGNOSIS — Z79899 Other long term (current) drug therapy: Secondary | ICD-10-CM | POA: Diagnosis not present

## 2020-10-11 DIAGNOSIS — K1120 Sialoadenitis, unspecified: Secondary | ICD-10-CM | POA: Diagnosis present

## 2020-10-11 DIAGNOSIS — Z79890 Hormone replacement therapy (postmenopausal): Secondary | ICD-10-CM | POA: Diagnosis not present

## 2020-10-11 DIAGNOSIS — K115 Sialolithiasis: Secondary | ICD-10-CM | POA: Diagnosis present

## 2020-10-11 DIAGNOSIS — F32A Depression, unspecified: Secondary | ICD-10-CM | POA: Diagnosis present

## 2020-10-11 DIAGNOSIS — K219 Gastro-esophageal reflux disease without esophagitis: Secondary | ICD-10-CM | POA: Diagnosis present

## 2020-10-11 DIAGNOSIS — Z9049 Acquired absence of other specified parts of digestive tract: Secondary | ICD-10-CM | POA: Diagnosis not present

## 2020-10-11 DIAGNOSIS — I10 Essential (primary) hypertension: Secondary | ICD-10-CM | POA: Diagnosis present

## 2020-10-11 DIAGNOSIS — L409 Psoriasis, unspecified: Secondary | ICD-10-CM | POA: Diagnosis present

## 2020-10-11 DIAGNOSIS — K5090 Crohn's disease, unspecified, without complications: Secondary | ICD-10-CM | POA: Diagnosis present

## 2020-10-11 DIAGNOSIS — Z87891 Personal history of nicotine dependence: Secondary | ICD-10-CM | POA: Diagnosis not present

## 2020-10-11 LAB — C-REACTIVE PROTEIN: CRP: 7.8 mg/dL — ABNORMAL HIGH (ref <1.0)

## 2020-10-11 LAB — CBC
HCT: 33.3 % — ABNORMAL LOW (ref 36.0–46.0)
Hemoglobin: 10.7 g/dL — ABNORMAL LOW (ref 12.0–15.0)
MCH: 30.1 pg (ref 26.0–34.0)
MCHC: 32.1 g/dL (ref 30.0–36.0)
MCV: 93.5 fL (ref 80.0–100.0)
Platelets: 175 10*3/uL (ref 150–400)
RBC: 3.56 MIL/uL — ABNORMAL LOW (ref 3.87–5.11)
RDW: 15 % (ref 11.5–15.5)
WBC: 15 10*3/uL — ABNORMAL HIGH (ref 4.0–10.5)
nRBC: 0 % (ref 0.0–0.2)

## 2020-10-11 LAB — HIV ANTIBODY (ROUTINE TESTING W REFLEX): HIV Screen 4th Generation wRfx: NONREACTIVE

## 2020-10-11 LAB — BASIC METABOLIC PANEL
Anion gap: 6 (ref 5–15)
BUN: 8 mg/dL (ref 8–23)
CO2: 25 mmol/L (ref 22–32)
Calcium: 7.6 mg/dL — ABNORMAL LOW (ref 8.9–10.3)
Chloride: 107 mmol/L (ref 98–111)
Creatinine, Ser: 0.8 mg/dL (ref 0.44–1.00)
GFR, Estimated: >60 mL/min (ref >60)
Glucose, Bld: 167 mg/dL — ABNORMAL HIGH (ref 70–99)
Potassium: 4.1 mmol/L (ref 3.5–5.1)
Sodium: 138 mmol/L (ref 135–145)

## 2020-10-11 LAB — FERRITIN: Ferritin: 66 ng/mL (ref 11–307)

## 2020-10-11 LAB — SARS CORONAVIRUS 2 (TAT 6-24 HRS): SARS Coronavirus 2: POSITIVE — AB

## 2020-10-11 LAB — FIBRIN DERIVATIVES D-DIMER (ARMC ONLY): Fibrin derivatives D-dimer (ARMC): 638.35 ng/mL (FEU) — ABNORMAL HIGH (ref 0.00–499.00)

## 2020-10-11 MED ORDER — ASCORBIC ACID 500 MG PO TABS
500.0000 mg | ORAL_TABLET | Freq: Every day | ORAL | Status: DC
  Administered 2020-10-11 – 2020-10-12 (×2): 500 mg via ORAL
  Filled 2020-10-11 (×2): qty 1

## 2020-10-11 MED ORDER — ZINC SULFATE 220 (50 ZN) MG PO CAPS
220.0000 mg | ORAL_CAPSULE | Freq: Every day | ORAL | Status: DC
  Administered 2020-10-11 – 2020-10-12 (×2): 220 mg via ORAL
  Filled 2020-10-11 (×2): qty 1

## 2020-10-11 MED ORDER — SODIUM CHLORIDE 0.9 % IV SOLN
200.0000 mg | Freq: Once | INTRAVENOUS | Status: AC
  Administered 2020-10-11: 200 mg via INTRAVENOUS
  Filled 2020-10-11: qty 200

## 2020-10-11 MED ORDER — SODIUM CHLORIDE 0.9 % IV SOLN: 100.0000 mg | Freq: Every day | INTRAVENOUS | Status: DC

## 2020-10-11 MED ORDER — ALBUTEROL SULFATE HFA 108 (90 BASE) MCG/ACT IN AERS
2.0000 | INHALATION_SPRAY | Freq: Four times a day (QID) | RESPIRATORY_TRACT | Status: DC
  Administered 2020-10-11 – 2020-10-12 (×3): 2 via RESPIRATORY_TRACT
  Filled 2020-10-11: qty 6.7

## 2020-10-11 NOTE — Progress Notes (Signed)
.. 10/11/2020 11:05 AM  Sharon [name], Sharon [name] [id number]    Temp:  [98.4 F (36.9 C)-100.5 F (38.1 C)] 98.4 F (36.9 C) (02/12 1929) Pulse Rate:  [67-90] 67 (02/13 0932) Resp:  [16-20] 18 (02/13 0932) BP: (109-131)/(47-84) 109/47 (02/13 0932) SpO2:  [98 %-100 %] 98 % (02/13 0932) Weight:  [77 kg] 77 kg (02/12 1712),     Intake/Output Summary (Last 24 hours) at 10/11/2020 1105 Last data filed at 10/11/2020 0240 Gross per 24 hour  Intake 2850 ml  Output -  Net 2850 ml    Results for orders placed or performed during the hospital encounter of 10/10/20 (from the past 24 hour(s))  Comprehensive metabolic panel     Status: Abnormal   Collection Time: 10/10/20  5:17 PM  Result Value Ref Range   Sodium 136 135 - 145 mmol/L   Potassium 4.2 3.5 - 5.1 mmol/L   Chloride 101 98 - 111 mmol/L   CO2 28 22 - 32 mmol/L   Glucose, Bld 122 (H) 70 - 99 mg/dL   BUN 6 (L) 8 - 23 mg/dL   Creatinine, Ser 0.83 0.44 - 1.00 mg/dL   Calcium 8.6 (L) 8.9 - 10.3 mg/dL   Total Protein 7.8 6.5 - 8.1 g/dL   Albumin 3.8 3.5 - 5.0 g/dL   AST 32 15 - 41 U/L   ALT 19 0 - 44 U/L   Alkaline Phosphatase 93 38 - 126 U/L   Total Bilirubin 0.7 0.3 - 1.2 mg/dL   GFR, Estimated >60 >60 mL/min   Anion gap 7 5 - 15  Lactic acid, plasma     Status: None   Collection Time: 10/10/20  5:17 PM  Result Value Ref Range   Lactic Acid, Venous 1.3 0.5 - 1.9 mmol/L  CBC with Differential     Status: Abnormal   Collection Time: 10/10/20  5:17 PM  Result Value Ref Range   WBC 16.5 (H) 4.0 - 10.5 K/uL   RBC 3.84 (L) 3.87 - 5.11 MIL/uL   Hemoglobin 11.4 (L) 12.0 - 15.0 g/dL   HCT 35.9 (L) 36.0 - 46.0 %   MCV 93.5 80.0 - 100.0 fL   MCH 29.7 26.0 - 34.0 pg   MCHC 31.8 30.0 - 36.0 g/dL   RDW 14.8 11.5 - 15.5 %   Platelets 209 150 - 400 K/uL   nRBC 0.0 0.0 - 0.2 %   Neutrophils Relative % 66 %   Neutro Abs 11.1 (H) 1.7 - 7.7 K/uL   Lymphocytes Relative 24 %   Lymphs Abs 3.9 0.7 - 4.0 K/uL   Monocytes Relative 7 %    Monocytes Absolute 1.1 (H) 0.1 - 1.0 K/uL   Eosinophils Relative 2 %   Eosinophils Absolute 0.3 0.0 - 0.5 K/uL   Basophils Relative 1 %   Basophils Absolute 0.1 0.0 - 0.1 K/uL   Immature Granulocytes 0 %   Abs Immature Granulocytes 0.07 0.00 - 0.07 K/uL  Protime-INR     Status: Abnormal   Collection Time: 10/10/20  5:17 PM  Result Value Ref Range   Prothrombin Time 15.2 11.4 - 15.2 seconds   INR 1.3 (H) 0.8 - 1.2  Culture, blood (Routine x 2)     Status: None (Preliminary result)   Collection Time: 10/10/20  5:17 PM   Specimen: BLOOD  Result Value Ref Range   Specimen Description BLOOD RIGHT ANTECUBITAL    Special Requests      BOTTLES DRAWN AEROBIC AND ANAEROBIC Blood Culture  adequate volume   Culture      NO GROWTH < 24 HOURS Performed at [facility], [address] [place], [place] [address]    Report Status PENDING   Lactic acid, plasma     Status: None   Collection Time: 10/10/20  8:29 PM  Result Value Ref Range   Lactic Acid, Venous 1.2 0.5 - 1.9 mmol/L  Urinalysis, Complete w Microscopic Urine, Clean Catch     Status: Abnormal   Collection Time: 10/10/20  9:54 PM  Result Value Ref Range   Color, Urine STRAW (A) YELLOW   APPearance CLEAR (A) CLEAR   Specific Gravity, Urine 1.032 (H) 1.005 - 1.030   pH 7.0 5.0 - 8.0   Glucose, UA NEGATIVE NEGATIVE mg/dL   Hgb urine dipstick SMALL (A) NEGATIVE   Bilirubin Urine NEGATIVE NEGATIVE   Ketones, ur NEGATIVE NEGATIVE mg/dL   Protein, ur NEGATIVE NEGATIVE mg/dL   Nitrite NEGATIVE NEGATIVE   Leukocytes,Ua TRACE (A) NEGATIVE   RBC / HPF 0-5 0 - 5 RBC/hpf   WBC, UA 6-10 0 - 5 WBC/hpf   Bacteria, UA NONE SEEN NONE SEEN   Squamous Epithelial / LPF 0-5 0 - 5  CBC     Status: Abnormal   Collection Time: 10/11/20  2:55 AM  Result Value Ref Range   WBC 15.0 (H) 4.0 - 10.5 K/uL   RBC 3.56 (L) 3.87 - 5.11 MIL/uL   Hemoglobin 10.7 (L) 12.0 - 15.0 g/dL   HCT 33.3 (L) 36.0 - 46.0 %   MCV 93.5 80.0 - 100.0 fL   MCH 30.1  26.0 - 34.0 pg   MCHC 32.1 30.0 - 36.0 g/dL   RDW 15.0 11.5 - 15.5 %   Platelets 175 150 - 400 K/uL   nRBC 0.0 0.0 - 0.2 %  Basic metabolic panel     Status: Abnormal   Collection Time: 10/11/20  2:55 AM  Result Value Ref Range   Sodium 138 135 - 145 mmol/L   Potassium 4.1 3.5 - 5.1 mmol/L   Chloride 107 98 - 111 mmol/L   CO2 25 22 - 32 mmol/L   Glucose, Bld 167 (H) 70 - 99 mg/dL   BUN 8 8 - 23 mg/dL   Creatinine, Ser 0.80 0.44 - 1.00 mg/dL   Calcium 7.6 (L) 8.9 - 10.3 mg/dL   GFR, Estimated >60 >60 mL/min   Anion gap 6 5 - 15    SUBJECTIVE:  No acute events overnight.  Feeling better this morning.  Pain improved.  Slept some.  Tolerating PO.  OBJECTIVE:  GEN- NAD, supine in bed asleep RESP-  Unlabored with no accessory muscle use OC/OP- improved floor of mouth edema, purulence from right submandibular duct NECK- improved tenderness, continued right submandibular swelling  IMPRESSION:  Sialoadenitis, sialolithiasis, floor of mouth cellulitis  PLAN:  Improved overnight.  Continue Vancomycin and Continue Decadron 55m IV q8 x 24 hours.  Continue sialogogues and order for lemon wedges placed. Continue massage.  Will re-evaluate in a.m.  Nykeria Mealing 10/11/2020, 11:05 AM

## 2020-10-11 NOTE — ED Notes (Signed)
Pt oriented to unit, ivf infusing. Pt provided with additional pillow, blanket, pt has belongings in bed including glasses. Pt denies further needs.

## 2020-10-11 NOTE — Progress Notes (Signed)
PROGRESS NOTE    Sharon [name]  POE:[id number] DOB: 12/17/1955 DOA: 10/10/2020 PCP: Zeb Comfort, MD    Brief Narrative:  65 y.o. female with medical history significant of anxiety, Crohn's, GERD, HTN was seen as outpatient 10/08/20 diagnosed with sialadenitis and was treated conservatively. She developed increased swelling and pain and presented to [facility] 10/09/20 where CT confirmed 28m stone in submandibular salivary duct with standing and edema. She was started on oral abx. Pt had no relief with increased pain and swelling. Pt subsequently presented to ED for further work up. In the ED, pt was found to be febrile with WBC of 16.5K  Assessment & Plan:   Active Problems:   HTN (hypertension)   Crohn's disease (HCC)   GERD (gastroesophageal reflux disease)   Sialadenitis   Sepsis (HCC)  1, Sialadenitis  - right submandibular duct - 320mstone with edema, stranding in soft tisse. -ENT consulted and is following, appreciate recs. Recommendations for continued abx and decadron x 24hrs. Also plan for continued massage and sialogogues -Will repeat CBC in AM - She has received Abx and decadron -Pt is continued on Zosyn, will continue for now  2. Sepsis secondary to sialadenitis poa - 2/2 to #1 with fever, leukocytosis, lacticacidosis. She has received 1.5 L NS and had Abx on board. No end organ damage, no hypotension. -cont abx per above -Repeat CBC in AM -Now afebrile  3. HTN - continue home meds -BP trends are stable  4. Crohn's - stable  5. GERD - continue PPI  6. Anxiety/depression - continue home meds -Continue with Xanax 0.5 mg q4 prn for anxiety  7. COVID pos -Pt is fully vaccinated, including booster -Pt is asymptomatic, on room air, CXR ordered and reviewed, clear -Will treat with 3 days of remdesivir for incidentally diagnosed COVID -Had ordered inflammatory markers per order set, however given above sialadenitis, markers are expected to be elevated                DVT prophylaxis: Heparin subq Code Status: Full Family Communication: Pt in room and over phone  Status is: Observation  The patient will require care spanning > 2 midnights and should be moved to inpatient because: IV treatments appropriate due to intensity of illness or inability to take PO and Inpatient level of care appropriate due to severity of illness  Dispo: The patient is from: Home              Anticipated d/c is to: Home              Anticipated d/c date is: 2 days              Patient currently is not medically stable to d/c.   Difficult to place patient No   Consultants:   ENT  Procedures:     Antimicrobials: Anti-infectives (From admission, onward)   Start     Dose/Rate Route Frequency Ordered Stop   10/12/20 1000  remdesivir 100 mg in sodium chloride 0.9 % 100 mL IVPB       "Followed by" Linked Group Details   100 mg 200 mL/hr over 30 Minutes Intravenous Daily 10/11/20 1455 10/14/20 0959   10/11/20 1545  remdesivir 200 mg in sodium chloride 0.9% 250 mL IVPB       "Followed by" Linked Group Details   200 mg 580 mL/hr over 30 Minutes Intravenous Once 10/11/20 1455     10/11/20 0430  piperacillin-tazobactam (ZOSYN) IVPB 3.375 g  3.375 g 12.5 mL/hr over 240 Minutes Intravenous Every 8 hours 10/10/20 2052 10/11/20 2029   10/10/20 2200  piperacillin-tazobactam (ZOSYN) IVPB 3.375 g  Status:  Discontinued        3.375 g 100 mL/hr over 30 Minutes Intravenous Every 8 hours 10/10/20 2048 10/10/20 2050   10/10/20 2000  piperacillin-tazobactam (ZOSYN) IVPB 3.375 g        3.375 g 100 mL/hr over 30 Minutes Intravenous  Once 10/10/20 1945 10/10/20 2124   10/10/20 2000  vancomycin (VANCOREADY) IVPB 1750 mg/350 mL        1,750 mg 175 mL/hr over 120 Minutes Intravenous  Once 10/10/20 1956 10/11/20 0015       Subjective: Complains of continued jaw pain  Objective: Vitals:   10/11/20 0258 10/11/20 0932 10/11/20 1136 10/11/20 1553  BP: 118/69 (!)  109/47 (!) 127/59 111/63  Pulse: 90 67 65 64  Resp: 16 18 16 16   Temp:   97.6 F (36.4 C) 98.5 F (36.9 C)  TempSrc:   Oral   SpO2:  98% 100% 98%  Weight:      Height:        Intake/Output Summary (Last 24 hours) at 10/11/2020 1620 Last data filed at 10/11/2020 1215 Gross per 24 hour  Intake 3010.27 ml  Output --  Net 3010.27 ml   Filed Weights   10/10/20 1712  Weight: 77 kg    Examination: General exam: Appears calm and comfortable  Respiratory system: Clear to auscultation. Respiratory effort normal. Cardiovascular system: S1 & S2 heard, Regular Gastrointestinal system: Abdomen is nondistended, soft and nontender. No organomegaly or masses felt. Normal bowel sounds heard. Central nervous system: Alert and oriented. No focal neurological deficits. Extremities: Symmetric 5 x 5 power. Skin: No rashes, lesions Psychiatry: Judgement and insight appear normal. Mood & affect appropriate.   Data Reviewed: I have personally reviewed following labs and imaging studies  CBC: Recent Labs  Lab 10/09/20 1921 10/10/20 1717 10/11/20 0255  WBC 14.1* 16.5* 15.0*  NEUTROABS  --  11.1*  --   HGB 11.0* 11.4* 10.7*  HCT 34.0* 35.9* 33.3*  MCV 92.9 93.5 93.5  PLT 222 209 545   Basic Metabolic Panel: Recent Labs  Lab 10/09/20 1921 10/10/20 1717 10/11/20 0255  NA 137 136 138  K 3.6 4.2 4.1  CL 106 101 107  CO2 21* 28 25  GLUCOSE 134* 122* 167*  BUN 8 6* 8  CREATININE 0.81 0.83 0.80  CALCIUM 8.7* 8.6* 7.6*   GFR: Estimated Creatinine Clearance: 68.5 mL/min (by C-G formula based on SCr of 0.8 mg/dL). Liver Function Tests: Recent Labs  Lab 10/10/20 1717  AST 32  ALT 19  ALKPHOS 93  BILITOT 0.7  PROT 7.8  ALBUMIN 3.8   No results for input(s): LIPASE, AMYLASE in the last 168 hours. No results for input(s): AMMONIA in the last 168 hours. Coagulation Profile: Recent Labs  Lab 10/10/20 1717  INR 1.3*   Cardiac Enzymes: No results for input(s): CKTOTAL, CKMB,  CKMBINDEX, TROPONINI in the last 168 hours. BNP (last 3 results) No results for input(s): PROBNP in the last 8760 hours. HbA1C: No results for input(s): HGBA1C in the last 72 hours. CBG: No results for input(s): GLUCAP in the last 168 hours. Lipid Profile: No results for input(s): CHOL, HDL, LDLCALC, TRIG, CHOLHDL, LDLDIRECT in the last 72 hours. Thyroid Function Tests: No results for input(s): TSH, T4TOTAL, FREET4, T3FREE, THYROIDAB in the last 72 hours. Anemia Panel: Recent Labs  10/11/20 1508  FERRITIN 66   Sepsis Labs: Recent Labs  Lab 10/10/20 1717 10/10/20 2029  LATICACIDVEN 1.3 1.2    Recent Results (from the past 240 hour(s))  Culture, blood (Routine x 2)     Status: None (Preliminary result)   Collection Time: 10/10/20  5:17 PM   Specimen: BLOOD  Result Value Ref Range Status   Specimen Description BLOOD RIGHT ANTECUBITAL  Final   Special Requests   Final    BOTTLES DRAWN AEROBIC AND ANAEROBIC Blood Culture adequate volume   Culture   Final    NO GROWTH < 24 HOURS Performed at [facility], [address] [place], [place] [address]    Report Status PENDING  Incomplete  SARS CORONAVIRUS 2 (TAT 6-24 HRS) Nasopharyngeal Nasopharyngeal Swab     Status: Abnormal   Collection Time: 10/10/20  8:29 PM   Specimen: Nasopharyngeal Swab  Result Value Ref Range Status   SARS Coronavirus 2 POSITIVE (A) NEGATIVE Final    Comment: (NOTE) SARS-CoV-2 target nucleic acids are DETECTED.  The SARS-CoV-2 RNA is generally detectable in upper and lower respiratory specimens during the acute phase of infection. Positive results are indicative of the presence of SARS-CoV-2 RNA. Clinical correlation with patient history and other diagnostic information is  necessary to determine patient infection status. Positive results do not rule out bacterial infection or co-infection with other viruses.  The expected result is Negative.  Fact Sheet for  Patients: SugarRoll.be  Fact Sheet for Healthcare Providers: https://www.woods-mathews.com/  This test is not yet approved or cleared by the [place] FDA and  has been authorized for detection and/or diagnosis of SARS-CoV-2 by FDA under an Emergency Use Authorization (EUA). This EUA will remain  in effect (meaning this test can be used) for the duration of the COVID-19 declaration under Section 564(b)(1) of the Act, 21 U. S.C. section 360bbb-3(b)(1), unless the authorization is terminated or revoked sooner.   Performed at [facility], [place] [address], [place], [place] [address]      Radiology Studies: CT Soft Tissue Neck W Contrast  Result Date: 10/09/2020 CLINICAL DATA:  Neck abscess, deep tissue. Additional history provided: Patient reports diagnosed with salivary stone at urgent care, swelling worsened overnight. EXAM: CT NECK WITH CONTRAST TECHNIQUE: Multidetector CT imaging of the neck was performed using the standard protocol following the bolus administration of intravenous contrast. CONTRAST:  1m OMNIPAQUE IOHEXOL 300 MG/ML  SOLN COMPARISON:  No pertinent prior exams available for comparison. FINDINGS: Pharynx and larynx: No definite inflammatory changes appreciated within the floor of mouth. No discrete mass is identified within the oral cavity, pharynx or larynx. No retropharyngeal collection. Salivary glands: Asymmetric prominence and hyperenhancement of the right submandibular gland with surrounding inflammatory stranding. Findings are compatible with sialoadenitis. There is a 3 mm sialolith within right floor of mouth within the right submandibular duct (series 2, image 40) (series 6, image 34). There is no appreciable abscess. The bilateral parotid and left submandibular glands are unremarkable. Thyroid: The gland is small, but otherwise unremarkable. Lymph nodes: Left cervical chain lymph nodes measure subcentimeter in short  axis, but are asymmetrically prominent, likely reactive. Vascular: The major vascular structures of the neck are patent. Calcified atherosclerotic plaque within the left carotid bifurcation and proximal ICA. Limited intracranial: No acute intracranial abnormality identified Visualized orbits: No mass or acute finding at the imaged levels. Mastoids and visualized paranasal sinuses: Mild bilateral maxillary sinus mucosal thickening. No significant mastoid effusion Skeleton: Cervical spondylosis. No  acute bony abnormality or aggressive osseous lesion. Upper chest: No consolidation within the imaged lung apices. IMPRESSION: Asymmetric prominence and hyperenhancement of the right submandibular gland with surrounding inflammatory stranding. Findings are compatible with sialoadenitis. Associated 3 mm sialolith within the right floor of mouth (within the right submandibular duct). No abscess is identified. Right cervical lymph nodes measure subcentimeter in short axis, but are asymmetrically prominent, likely reactive. Electronically Signed   By: Kellie Simmering DO   On: 10/09/2020 21:58   CT Maxillofacial W Contrast  Result Date: 10/10/2020 CLINICAL DATA:  Maxillary/facial abscess.  Neck CT 10/09/2020. EXAM: CT MAXILLOFACIAL WITH CONTRAST TECHNIQUE: Multidetector CT imaging of the maxillofacial structures was performed with intravenous contrast. Multiplanar CT image reconstructions were also generated. CONTRAST:  54m OMNIPAQUE IOHEXOL 300 MG/ML  SOLN COMPARISON:  No pertinent prior exams available for comparison. FINDINGS: Osseous: No acute bony abnormality or aggressive osseous lesion. No maxillofacial fracture. Orbits: No acute finding. The globes are normal in size and contour. The extraocular muscles and optic nerve sheath complexes are symmetric and unremarkable. Sinuses: Trace mucosal thickening within the maxillary sinuses bilaterally. Soft tissues: Significantly progressed from the neck CT performed one day  prior, there is asymmetric prominence and hyperenhancement of the right submandibular gland with surrounding inflammatory stranding. Findings are compatible with sialoadenitis. New from the prior examination, there is dilation and hyperenhancement of the right submandibular duct within the gland and within the floor of mouth. The right submandibular duct now measures 5 mm in diameter. Redemonstrated 3 mm obstructing stone within the right submandibular duct within the floor of mouth. New from the prior exam, there is swelling and inflammatory stranding within the right floor of mouth suspicious for floor of mouth infection. No appreciable abscess. As before, right cervical chain lymph nodes measure subcentimeter in short axis but are asymmetrically prominent, likely reactive. Limited intracranial: No acute intracranial abnormality identified. These results were called by telephone at the time of interpretation on 10/10/2020 at 7:15 pm [facility] who verbally acknowledged these results. IMPRESSION: Since the CT neck performed one day prior 10/09/2020, there has been significant interval progression of findings of right submandibular gland sialoadenitis and surrounding inflammatory changes. The right submandibular duct is now dilated and hyperenhancing within the gland and within the right floor of mouth (measuring 5 mm in diameter). Unchanged position of an obstructing 3 mm stone within the right submandibular duct within the mid to anterior floor of mouth. New from the prior exam, there is soft tissue swelling and stranding within the right floor of mouth suspicious for secondary floor of mouth infection. No abscess is identified. As before, right cervical chain lymph nodes are asymmetrically prominent, likely reactive. Electronically Signed   By: KKellie SimmeringDO   On: 10/10/2020 19:23   Portable chest 1 View  Result Date: 10/11/2020 CLINICAL DATA:  Shortness of breath.  COVID-19 positive EXAM:  PORTABLE CHEST 1 VIEW COMPARISON:  March 17, 2018 FINDINGS: Lungs are clear. Heart size and pulmonary vascularity are normal. No adenopathy. No bone lesions. IMPRESSION: Lungs clear.  Cardiac silhouette normal. Electronically Signed   By: WLowella GripIII M.D.   On: 10/11/2020 15:48    Scheduled Meds: . albuterol  2 puff Inhalation Q6H  . vitamin C  500 mg Oral Daily  . escitalopram  20 mg Oral Daily  . heparin  5,000 Units Subcutaneous Q8H  . melatonin  10 mg Oral QHS  . metoprolol tartrate  50 mg Oral BID  .  nortriptyline  75 mg Oral QHS  . pantoprazole  40 mg Oral Daily  . rosuvastatin  5 mg Oral QHS  . senna  1 tablet Oral BID  . vitamin B-12  500 mcg Oral Daily  . zinc sulfate  220 mg Oral Daily   Continuous Infusions: . sodium chloride 50 mL/hr at 10/11/20 1150  . piperacillin-tazobactam (ZOSYN)  IV 3.375 g (10/11/20 1501)  . remdesivir 200 mg in sodium chloride 0.9% 250 mL IVPB     Followed by  . [START ON 10/12/2020] remdesivir 100 mg in NS 100 mL       LOS: 0 days   Marylu Lund, MD [facility] Pager On Amion  If 7PM-7AM, please contact night-coverage 10/11/2020, 4:20 PM

## 2020-10-11 NOTE — ED Notes (Signed)
Report from [place], rn.

## 2020-10-11 NOTE — ED Notes (Signed)
Pt given breakfast tray

## 2020-10-11 NOTE — ED Notes (Signed)
Blood cultures obtained in triage

## 2020-10-11 NOTE — ED Notes (Signed)
Report to jennifer, rn.

## 2020-10-12 DIAGNOSIS — K1120 Sialoadenitis, unspecified: Secondary | ICD-10-CM

## 2020-10-12 LAB — CBC
HCT: 28.6 % — ABNORMAL LOW (ref 36.0–46.0)
Hemoglobin: 9.6 g/dL — ABNORMAL LOW (ref 12.0–15.0)
MCH: 31.1 pg (ref 26.0–34.0)
MCHC: 33.6 g/dL (ref 30.0–36.0)
MCV: 92.6 fL (ref 80.0–100.0)
Platelets: 186 10*3/uL (ref 150–400)
RBC: 3.09 MIL/uL — ABNORMAL LOW (ref 3.87–5.11)
RDW: 15 % (ref 11.5–15.5)
WBC: 18.7 10*3/uL — ABNORMAL HIGH (ref 4.0–10.5)
nRBC: 0 % (ref 0.0–0.2)

## 2020-10-12 LAB — COMPREHENSIVE METABOLIC PANEL
ALT: 14 U/L (ref 0–44)
AST: 20 U/L (ref 15–41)
Albumin: 2.8 g/dL — ABNORMAL LOW (ref 3.5–5.0)
Alkaline Phosphatase: 75 U/L (ref 38–126)
Anion gap: 8 (ref 5–15)
BUN: 12 mg/dL (ref 8–23)
CO2: 24 mmol/L (ref 22–32)
Calcium: 7.9 mg/dL — ABNORMAL LOW (ref 8.9–10.3)
Chloride: 104 mmol/L (ref 98–111)
Creatinine, Ser: 0.73 mg/dL (ref 0.44–1.00)
GFR, Estimated: >60 mL/min (ref >60)
Glucose, Bld: 99 mg/dL (ref 70–99)
Potassium: 3.9 mmol/L (ref 3.5–5.1)
Sodium: 136 mmol/L (ref 135–145)
Total Bilirubin: 0.3 mg/dL (ref 0.3–1.2)
Total Protein: 6.1 g/dL — ABNORMAL LOW (ref 6.5–8.1)

## 2020-10-12 LAB — C-REACTIVE PROTEIN: CRP: 5 mg/dL — ABNORMAL HIGH (ref <1.0)

## 2020-10-12 LAB — FIBRIN DERIVATIVES D-DIMER (ARMC ONLY): Fibrin derivatives D-dimer (ARMC): 851.63 ng/mL (FEU) — ABNORMAL HIGH (ref 0.00–499.00)

## 2020-10-12 MED ORDER — ONDANSETRON HCL 4 MG PO TABS: 4.0000 mg | ORAL_TABLET | Freq: Every day | ORAL | 0 refills | Status: DC | PRN

## 2020-10-12 MED ORDER — ONDANSETRON HCL 4 MG PO TABS: 4.0000 mg | ORAL_TABLET | Freq: Three times a day (TID) | ORAL | 0 refills | Status: AC | PRN

## 2020-10-12 MED ORDER — PREDNISONE 10 MG PO TABS: ORAL_TABLET | ORAL | 0 refills | Status: AC

## 2020-10-12 NOTE — Discharge Summary (Signed)
Physician Discharge Summary  Sharon [name] JXB:[id number] DOB: 09/11/1955 DOA: 10/10/2020  PCP: Zeb Comfort, MD  Admit date: 10/10/2020 Discharge date: 10/12/2020  Admitted From: Home Disposition: Home  Recommendations for Outpatient Follow-up:  1. Follow up with PCP in 1-2 weeks 2. Please obtain BMP/CBC in one week 3. Please follow up with ENT in 2 to 3 days per their schedule  Home Health: None Equipment/Devices: None  Discharge Condition: Stable CODE STATUS: Full Diet recommendation: Soft diet as tolerated  Brief/Interim Summary: 65 y.o.femalewith medical history significant ofanxiety, Crohn's, GERD, HTN was seen as outpatient 10/08/20 diagnosed with sialadenitis and was treated conservatively. She developed increased swelling and pain and presented to [facility]ED 10/09/20 where CT confirmed 71m stone in submandibular salivary duct with standing and edema. She was started on oral abx. Pt had no relief with increased pain and swelling. Pt subsequently presented to ED for further work up. In the ED, pt was found to be febrile with WBC of 16.5K.  Patient admitted as above with sepsis secondary to sialoadenitis and sialolithiasis having been evaluated by ENT.  We will continue patient's previous pain regimen and antibiotics with oxycodone and cephalexin, continue to massage the gland as described by ENT.  Covid positive status is incidental, no current medication or treatment recommended or ongoing given lack of symptoms and unclear presentation for acute versus subacute or resolved Covid infection at this point.  Patient otherwise stable and agreeable for discharge.  Discharge Diagnoses:  Active Problems:   HTN (hypertension)   Crohn's disease (HCC)   GERD (gastroesophageal reflux disease)   Sialadenitis   [place]is [place]   Salivary gland infection    Discharge Instructions  Discharge Instructions    Call MD for:  temperature >100.4   Complete by: As directed    Diet - low  sodium heart healthy   Complete by: As directed    Discharge instructions   Complete by: As directed    Follow-up with ENT at their office on the 16th or 17 February and just 2 to 3 days per their schedule.   Increase activity slowly   Complete by: As directed      Allergies as of 10/12/2020      Reactions   Infliximab Dermatitis   Codeine Nausea And Vomiting   Flagyl [metronidazole] Nausea And Vomiting   Hydrocodone Nausea And Vomiting   Ibuprofen Other (See Comments)   Pt states that she is unable to take due to her Crohn's disease.   Remeron [mirtazapine] Other (See Comments)   Reaction:  Makes pt lethargic       Medication List    STOP taking these medications   metoprolol tartrate 50 MG tablet Commonly known as: LOPRESSOR     TAKE these medications   acetaminophen 500 MG tablet Commonly known as: TYLENOL Take 1,000-1,500 mg by mouth 2 (two) times daily as needed for moderate pain or headache.   aspirin EC 81 MG tablet Take 1 tablet (81 mg total) by mouth daily. Over the counter   Butalbital-APAP-Caffeine 50-325-40 MG capsule Take 1 capsule by mouth 3 (three) times daily as needed.   cephALEXin 500 MG capsule Commonly known as: KEFLEX Take 1 capsule (500 mg total) by mouth 4 (four) times daily for 7 days.   diphenhydrAMINE 25 mg capsule Commonly known as: BENADRYL Take 50 mg by mouth every 8 (eight) hours as needed for allergies.   escitalopram 20 MG tablet Commonly known as: LEXAPRO TAKE 1 TABLET BY MOUTH ONCE  DAILY   LUBRICATING EYE DROPS OP Place 1 drop into both eyes daily as needed (dry eyes).   Melatonin 10 MG Caps Take 10 mg by mouth at bedtime.   nortriptyline 75 MG capsule Commonly known as: PAMELOR Take 75 mg by mouth at bedtime.   nortriptyline 25 MG capsule Commonly known as: PAMELOR Take 25 mg by mouth at bedtime.   omeprazole 20 MG capsule Commonly known as: PRILOSEC Take 20 mg by mouth daily.   ondansetron 4 MG disintegrating  tablet Commonly known as: ZOFRAN-ODT Take 1 tablet (4 mg total) by mouth every 8 (eight) hours as needed.   oxyCODONE-acetaminophen 5-325 MG tablet Commonly known as: Percocet Take 1 tablet by mouth every 4 (four) hours as needed for severe pain.   rosuvastatin 5 MG tablet Commonly known as: CRESTOR Take 5 mg by mouth at bedtime.   vedolizumab 300 MG injection Commonly known as: ENTYVIO Inject into the vein every 8 (eight) weeks.   vitamin B-12 500 MCG tablet Commonly known as: CYANOCOBALAMIN Take 500 mcg by mouth daily.       Allergies  Allergen Reactions  . Infliximab Dermatitis  . Codeine Nausea And Vomiting  . Flagyl [Metronidazole] Nausea And Vomiting  . Hydrocodone Nausea And Vomiting  . Ibuprofen Other (See Comments)    Pt states that she is unable to take due to her Crohn's disease.  . Remeron [Mirtazapine] Other (See Comments)    Reaction:  Makes pt lethargic     Consultations: ENT  Procedures/Studies: CT Soft Tissue Neck W Contrast  Result Date: 10/09/2020 CLINICAL DATA:  Neck abscess, deep tissue. Additional history provided: Patient reports diagnosed with salivary stone at urgent care, swelling worsened overnight. EXAM: CT NECK WITH CONTRAST TECHNIQUE: Multidetector CT imaging of the neck was performed using the standard protocol following the bolus administration of intravenous contrast. CONTRAST:  98m OMNIPAQUE IOHEXOL 300 MG/ML  SOLN COMPARISON:  No pertinent prior exams available for comparison. FINDINGS: Pharynx and larynx: No definite inflammatory changes appreciated within the floor of mouth. No discrete mass is identified within the oral cavity, pharynx or larynx. No retropharyngeal collection. Salivary glands: Asymmetric prominence and hyperenhancement of the right submandibular gland with surrounding inflammatory stranding. Findings are compatible with sialoadenitis. There is a 3 mm sialolith within right floor of mouth within the right submandibular  duct (series 2, image 40) (series 6, image 34). There is no appreciable abscess. The bilateral parotid and left submandibular glands are unremarkable. Thyroid: The gland is small, but otherwise unremarkable. Lymph nodes: Left cervical chain lymph nodes measure subcentimeter in short axis, but are asymmetrically prominent, likely reactive. Vascular: The major vascular structures of the neck are patent. Calcified atherosclerotic plaque within the left carotid bifurcation and proximal ICA. Limited intracranial: No acute intracranial abnormality identified Visualized orbits: No mass or acute finding at the imaged levels. Mastoids and visualized paranasal sinuses: Mild bilateral maxillary sinus mucosal thickening. No significant mastoid effusion Skeleton: Cervical spondylosis. No acute bony abnormality or aggressive osseous lesion. Upper chest: No consolidation within the imaged lung apices. IMPRESSION: Asymmetric prominence and hyperenhancement of the right submandibular gland with surrounding inflammatory stranding. Findings are compatible with sialoadenitis. Associated 3 mm sialolith within the right floor of mouth (within the right submandibular duct). No abscess is identified. Right cervical lymph nodes measure subcentimeter in short axis, but are asymmetrically prominent, likely reactive. Electronically Signed   By: KKellie SimmeringDO   On: 10/09/2020 21:58   CT Maxillofacial W Contrast  Result Date:  10/10/2020 CLINICAL DATA:  Maxillary/facial abscess.  Neck CT 10/09/2020. EXAM: CT MAXILLOFACIAL WITH CONTRAST TECHNIQUE: Multidetector CT imaging of the maxillofacial structures was performed with intravenous contrast. Multiplanar CT image reconstructions were also generated. CONTRAST:  56m OMNIPAQUE IOHEXOL 300 MG/ML  SOLN COMPARISON:  No pertinent prior exams available for comparison. FINDINGS: Osseous: No acute bony abnormality or aggressive osseous lesion. No maxillofacial fracture. Orbits: No acute finding. The  globes are normal in size and contour. The extraocular muscles and optic nerve sheath complexes are symmetric and unremarkable. Sinuses: Trace mucosal thickening within the maxillary sinuses bilaterally. Soft tissues: Significantly progressed from the neck CT performed one day prior, there is asymmetric prominence and hyperenhancement of the right submandibular gland with surrounding inflammatory stranding. Findings are compatible with sialoadenitis. New from the prior examination, there is dilation and hyperenhancement of the right submandibular duct within the gland and within the floor of mouth. The right submandibular duct now measures 5 mm in diameter. Redemonstrated 3 mm obstructing stone within the right submandibular duct within the floor of mouth. New from the prior exam, there is swelling and inflammatory stranding within the right floor of mouth suspicious for floor of mouth infection. No appreciable abscess. As before, right cervical chain lymph nodes measure subcentimeter in short axis but are asymmetrically prominent, likely reactive. Limited intracranial: No acute intracranial abnormality identified. These results were called by telephone at the time of interpretation on 10/10/2020 at 7:15 pm to provider [facility], who verbally acknowledged these results. IMPRESSION: Since the CT neck performed one day prior 10/09/2020, there has been significant interval progression of findings of right submandibular gland sialoadenitis and surrounding inflammatory changes. The right submandibular duct is now dilated and hyperenhancing within the gland and within the right floor of mouth (measuring 5 mm in diameter). Unchanged position of an obstructing 3 mm stone within the right submandibular duct within the mid to anterior floor of mouth. New from the prior exam, there is soft tissue swelling and stranding within the right floor of mouth suspicious for secondary floor of mouth infection. No abscess is identified.  As before, right cervical chain lymph nodes are asymmetrically prominent, likely reactive. Electronically Signed   By: KKellie SimmeringDO   On: 10/10/2020 19:23   Portable chest 1 View  Result Date: 10/11/2020 CLINICAL DATA:  Shortness of breath.  COVID-19 positive EXAM: PORTABLE CHEST 1 VIEW COMPARISON:  March 17, 2018 FINDINGS: Lungs are clear. Heart size and pulmonary vascularity are normal. No adenopathy. No bone lesions. IMPRESSION: Lungs clear.  Cardiac silhouette normal. Electronically Signed   By: WLowella GripIII M.D.   On: 10/11/2020 15:48      Subjective: No acute issues or events overnight, feels quite well denies nausea vomiting diarrhea constipation headache fevers or chills.   Discharge Exam: Vitals:   10/12/20 0444 10/12/20 0810  BP: (!) 102/53 (!) 93/50  Pulse: 66 64  Resp: 14 18  Temp: 98 F (36.7 C) 97.8 F (36.6 C)  SpO2: 98% 97%   Vitals:   10/11/20 2108 10/11/20 2350 10/12/20 0444 10/12/20 0810  BP: (!) 100/56 (!) 89/41 (!) 102/53 (!) 93/50  Pulse: 66 62 66 64  Resp: 14 14 14 18   Temp: (!) 97.5 F (36.4 C) 97.7 F (36.5 C) 98 F (36.7 C) 97.8 F (36.6 C)  TempSrc:    Oral  SpO2: 95% 97% 98% 97%  Weight:      Height:        General: Pt is  alert, awake, not in acute distress Neck/throat: Minimal tenderness to right submandibular area with mild fluctuance Cardiovascular: RRR, S1/S2 +, no rubs, no gallops Respiratory: CTA bilaterally, no wheezing, no rhonchi Abdominal: Soft, NT, ND, bowel sounds + Extremities: no edema, no cyanosis    The results of significant diagnostics from this hospitalization (including imaging, microbiology, ancillary and laboratory) are listed below for reference.     Microbiology: Recent Results (from the past 240 hour(s))  Culture, blood (Routine x 2)     Status: None (Preliminary result)   Collection Time: 10/10/20  5:17 PM   Specimen: BLOOD  Result Value Ref Range Status   Specimen Description BLOOD RIGHT  ANTECUBITAL  Final   Special Requests   Final    BOTTLES DRAWN AEROBIC AND ANAEROBIC Blood Culture adequate volume   Culture   Final    NO GROWTH 2 DAYS Performed at [facility], [address] [place], [place] [address]    Report Status PENDING  Incomplete  SARS CORONAVIRUS 2 (TAT 6-24 HRS) Nasopharyngeal Nasopharyngeal Swab     Status: Abnormal   Collection Time: 10/10/20  8:29 PM   Specimen: Nasopharyngeal Swab  Result Value Ref Range Status   SARS Coronavirus 2 POSITIVE (A) NEGATIVE Final    Comment: (NOTE) SARS-CoV-2 target nucleic acids are DETECTED.  The SARS-CoV-2 RNA is generally detectable in upper and lower respiratory specimens during the acute phase of infection. Positive results are indicative of the presence of SARS-CoV-2 RNA. Clinical correlation with patient history and other diagnostic information is  necessary to determine patient infection status. Positive results do not rule out bacterial infection or co-infection with other viruses.  The expected result is Negative.  Fact Sheet for Patients: SugarRoll.be  Fact Sheet for Healthcare Providers: https://www.woods-mathews.com/  This test is not yet approved or cleared by the [place] FDA and  has been authorized for detection and/or diagnosis of SARS-CoV-2 by FDA under an Emergency Use Authorization (EUA). This EUA will remain  in effect (meaning this test can be used) for the duration of the COVID-19 declaration under Section 564(b)(1) of the Act, 21 U. S.C. section 360bbb-3(b)(1), unless the authorization is terminated or revoked sooner.   Performed at [facility], [place] [address], [place], [place] [address]   Culture, blood (Routine X 2) w Reflex to ID Panel     Status: None (Preliminary result)   Collection Time: 10/12/20  5:42 AM   Specimen: BLOOD  Result Value Ref Range Status   Specimen Description BLOOD  RIGHT [facility]  Final   Special  Requests   Final    BOTTLES DRAWN AEROBIC AND ANAEROBIC Blood Culture adequate volume   Culture   Final    NO GROWTH < 12 HOURS Performed at [facility], [place], [place] [address]    Report Status PENDING  Incomplete     Labs: BNP (last 3 results) Recent Labs    04/03/20 0956  BNP 31.4   Basic Metabolic Panel: Recent Labs  Lab 10/09/20 1921 10/10/20 1717 10/11/20 0255 10/12/20 0542  NA 137 136 138 136  K 3.6 4.2 4.1 3.9  CL 106 101 107 104  CO2 21* 28 25 24   GLUCOSE 134* 122* 167* 99  BUN 8 6* 8 12  CREATININE 0.81 0.83 0.80 0.73  CALCIUM 8.7* 8.6* 7.6* 7.9*   Liver Function Tests: Recent Labs  Lab 10/10/20 1717 10/12/20 0542  AST 32 20  ALT 19 14  ALKPHOS  93 75  BILITOT 0.7 0.3  PROT 7.8 6.1*  ALBUMIN 3.8 2.8*   No results for input(s): LIPASE, AMYLASE in the last 168 hours. No results for input(s): AMMONIA in the last 168 hours. CBC: Recent Labs  Lab 10/09/20 1921 10/10/20 1717 10/11/20 0255 10/12/20 0542  WBC 14.1* 16.5* 15.0* 18.7*  NEUTROABS  --  11.1*  --   --   HGB 11.0* 11.4* 10.7* 9.6*  HCT 34.0* 35.9* 33.3* 28.6*  MCV 92.9 93.5 93.5 92.6  PLT 222 209 175 186   Cardiac Enzymes: No results for input(s): CKTOTAL, CKMB, CKMBINDEX, TROPONINI in the last 168 hours. BNP: Invalid input(s): POCBNP CBG: No results for input(s): GLUCAP in the last 168 hours. D-Dimer No results for input(s): DDIMER in the last 72 hours. Hgb A1c No results for input(s): HGBA1C in the last 72 hours. Lipid Profile No results for input(s): CHOL, HDL, LDLCALC, TRIG, CHOLHDL, LDLDIRECT in the last 72 hours. Thyroid function studies No results for input(s): TSH, T4TOTAL, T3FREE, THYROIDAB in the last 72 hours.  Invalid input(s): FREET3 Anemia work up Recent Labs    10/11/20 1508  FERRITIN 66   Urinalysis    Component Value Date/Time   COLORURINE STRAW (A) 10/10/2020 2154   APPEARANCEUR CLEAR (A) 10/10/2020 2154   LABSPEC 1.032  (H) 10/10/2020 2154   PHURINE 7.0 10/10/2020 2154   GLUCOSEU NEGATIVE 10/10/2020 2154   HGBUR SMALL (A) 10/10/2020 2154   BILIRUBINUR NEGATIVE 10/10/2020 2154   BILIRUBINUR neg 01/01/2016 1200   KETONESUR NEGATIVE 10/10/2020 2154   PROTEINUR NEGATIVE 10/10/2020 2154   UROBILINOGEN 0.2 01/01/2016 1200   UROBILINOGEN 0.2 07/15/2014 1800   NITRITE NEGATIVE 10/10/2020 2154   LEUKOCYTESUR TRACE (A) 10/10/2020 2154   Sepsis Labs Invalid input(s): PROCALCITONIN,  WBC,  LACTICIDVEN Microbiology Recent Results (from the past 240 hour(s))  Culture, blood (Routine x 2)     Status: None (Preliminary result)   Collection Time: 10/10/20  5:17 PM   Specimen: BLOOD  Result Value Ref Range Status   Specimen Description BLOOD RIGHT ANTECUBITAL  Final   Special Requests   Final    BOTTLES DRAWN AEROBIC AND ANAEROBIC Blood Culture adequate volume   Culture   Final    NO GROWTH 2 DAYS Performed at [facility], [place], [place] [address]    Report Status PENDING  Incomplete  SARS CORONAVIRUS 2 (TAT 6-24 HRS) Nasopharyngeal Nasopharyngeal Swab     Status: Abnormal   Collection Time: 10/10/20  8:29 PM   Specimen: Nasopharyngeal Swab  Result Value Ref Range Status   SARS Coronavirus 2 POSITIVE (A) NEGATIVE Final    Comment: (NOTE) SARS-CoV-2 target nucleic acids are DETECTED.  The SARS-CoV-2 RNA is generally detectable in upper and lower respiratory specimens during the acute phase of infection. Positive results are indicative of the presence of SARS-CoV-2 RNA. Clinical correlation with patient history and other diagnostic information is  necessary to determine patient infection status. Positive results do not rule out bacterial infection or co-infection with other viruses.  The expected result is Negative.  Fact Sheet for Patients: SugarRoll.be  Fact Sheet for Healthcare Providers: https://www.woods-mathews.com/  This test  is not yet approved or cleared by the [place] FDA and  has been authorized for detection and/or diagnosis of SARS-CoV-2 by FDA under an Emergency Use Authorization (EUA). This EUA will remain  in effect (meaning this test can be used) for the duration of the COVID-19 declaration under Section 564(b)(1) of the  Act, 21 U. S.C. section 360bbb-3(b)(1), unless the authorization is terminated or revoked sooner.   Performed at [facility], [place] [address], [place], [place] [address]   Culture, blood (Routine X 2) w Reflex to ID Panel     Status: None (Preliminary result)   Collection Time: 10/12/20  5:42 AM   Specimen: BLOOD  Result Value Ref Range Status   Specimen Description BLOOD  RIGHT [facility]  Final   Special Requests   Final    BOTTLES DRAWN AEROBIC AND ANAEROBIC Blood Culture adequate volume   Culture   Final    NO GROWTH < 12 HOURS Performed at [facility], [address] [place], [place] [address]    Report Status PENDING  Incomplete     Time coordinating discharge: Over 30 minutes  SIGNED:   Little Ishikawa, DO [facility] 10/12/2020, 10:43 AM Pager   If 7PM-7AM, please contact night-coverage [email or web address]

## 2020-10-12 NOTE — Progress Notes (Signed)
Received Md order to discharge patient to home, reviewed discharge instructions, homes meds, prescriptions and follow up appointments with patient and patient verbalized understanding

## 2020-10-12 NOTE — Progress Notes (Signed)
.. 10/12/2020 8:16 AM  [name], [name] [id number]    Temp:  [97.5 F (36.4 C)-98.5 F (36.9 C)] 97.8 F (36.6 C) (02/14 0810) Pulse Rate:  [62-67] 64 (02/14 0810) Resp:  [14-18] 18 (02/14 0810) BP: (89-127)/(41-63) 93/50 (02/14 0810) SpO2:  [95 %-100 %] 97 % (02/14 0810),     Intake/Output Summary (Last 24 hours) at 10/12/2020 0816 Last data filed at 10/11/2020 1643 Gross per 24 hour  Intake 383.54 ml  Output --  Net 383.54 ml    Results for orders placed or performed during the hospital encounter of 10/10/20 (from the past 24 hour(s))  C-reactive protein     Status: Abnormal   Collection Time: 10/11/20  3:08 PM  Result Value Ref Range   CRP 7.8 (H) <1.0 mg/dL  Fibrin derivatives D-Dimer ([facility] only)     Status: Abnormal   Collection Time: 10/11/20  3:08 PM  Result Value Ref Range   Fibrin derivatives D-dimer ([facility]) 638.35 (H) 0.00 - 499.00 ng/mL (FEU)  Ferritin     Status: None   Collection Time: 10/11/20  3:08 PM  Result Value Ref Range   Ferritin 66 11 - 307 ng/mL  Fibrin derivatives D-Dimer ([facility] only)     Status: Abnormal   Collection Time: 10/12/20  5:42 AM  Result Value Ref Range   Fibrin derivatives D-dimer ([facility]) 851.63 (H) 0.00 - 499.00 ng/mL (FEU)  Comprehensive metabolic panel     Status: Abnormal   Collection Time: 10/12/20  5:42 AM  Result Value Ref Range   Sodium 136 135 - 145 mmol/L   Potassium 3.9 3.5 - 5.1 mmol/L   Chloride 104 98 - 111 mmol/L   CO2 24 22 - 32 mmol/L   Glucose, Bld 99 70 - 99 mg/dL   BUN 12 8 - 23 mg/dL   Creatinine, Ser 0.73 0.44 - 1.00 mg/dL   Calcium 7.9 (L) 8.9 - 10.3 mg/dL   Total Protein 6.1 (L) 6.5 - 8.1 g/dL   Albumin 2.8 (L) 3.5 - 5.0 g/dL   AST 20 15 - 41 U/L   ALT 14 0 - 44 U/L   Alkaline Phosphatase 75 38 - 126 U/L   Total Bilirubin 0.3 0.3 - 1.2 mg/dL   GFR, Estimated >60 >60 mL/min   Anion gap 8 5 - 15  CBC     Status: Abnormal   Collection Time: 10/12/20  5:42 AM  Result Value Ref Range   WBC 18.7 (H) 4.0 -  10.5 K/uL   RBC 3.09 (L) 3.87 - 5.11 MIL/uL   Hemoglobin 9.6 (L) 12.0 - 15.0 g/dL   HCT 28.6 (L) 36.0 - 46.0 %   MCV 92.6 80.0 - 100.0 fL   MCH 31.1 26.0 - 34.0 pg   MCHC 33.6 30.0 - 36.0 g/dL   RDW 15.0 11.5 - 15.5 %   Platelets 186 150 - 400 K/uL   nRBC 0.0 0.0 - 0.2 %    SUBJECTIVE:  Improved pain and improved swelling.  No passing of stone.  Slept ok.  COVID + and placed in negative pressure room  OBJECTIVE:  GEN- NAD, supine in bed sleeping OC/OP- continued improved floor of mouth edema/erythema.  Continued purulence from submandibular duct.  Possible stone palpated in floor of mouth adjacent to mandibular tori NECK-  Continue right submandibular gland swelling but significant improvement from yesterday.  Improved tenderness.  No fluctuance  IMPRESSION:  Sialoadenitis, sialolithiasis, floor of mouth cellulitis improving  PLAN:  OK to discharge from  ENT perspective for sialoadenitis/sialolithiasis on oral antibiotics with good staph/strep coverage and Sterapred DS 6 day taper.  Continue submandibular massage and sialogogues and good fluid intake.  If patient continues to be inpatient on Wed/Thursday due to COVID + treatment, please let me know and I will come re-evaluated.  If she is discharged, I would like to see her as outpatient on Wed/Thursday to determine if she requires sialoendoscopy for removal of stone as outpatient.  Imo Cumbie 10/12/2020, 8:16 AM

## 2020-10-15 LAB — CULTURE, BLOOD (ROUTINE X 2)
Culture: NO GROWTH
Special Requests: ADEQUATE

## 2020-10-17 LAB — CULTURE, BLOOD (ROUTINE X 2)
Culture: NO GROWTH
Special Requests: ADEQUATE

## 2021-06-25 DIAGNOSIS — Z7960 Long term (current) use of unspecified immunomodulators and immunosuppressants: Secondary | ICD-10-CM | POA: Insufficient documentation

## 2022-05-04 ENCOUNTER — Ambulatory Visit: Payer: Self-pay | Admitting: Nurse Practitioner

## 2022-06-03 ENCOUNTER — Other Ambulatory Visit: Payer: Self-pay

## 2022-06-03 MED ORDER — METOPROLOL TARTRATE 50 MG PO TABS
50.0000 mg | ORAL_TABLET | Freq: Two times a day (BID) | ORAL | 0 refills | Status: DC
Start: 2021-06-29 — End: 2022-07-14
  Filled 2022-06-03: qty 60, 30d supply, fill #0

## 2022-06-03 MED ORDER — ESCITALOPRAM OXALATE 20 MG PO TABS
20.0000 mg | ORAL_TABLET | Freq: Every day | ORAL | 1 refills | Status: DC
  Filled 2022-06-03: qty 30, 30d supply, fill #0
  Filled 2022-07-07: qty 30, 30d supply, fill #1

## 2022-06-10 ENCOUNTER — Emergency Department
Admission: EM | Admit: 2022-06-10 | Discharge: 2022-06-11 | Disposition: A | Discharge status: Home / Self Care | Payer: 59 | Attending: Emergency Medicine | Admitting: Emergency Medicine

## 2022-06-10 ENCOUNTER — Emergency Department: Payer: 59

## 2022-06-10 DIAGNOSIS — D72829 Elevated white blood cell count, unspecified: Secondary | ICD-10-CM | POA: Diagnosis not present

## 2022-06-10 DIAGNOSIS — K1120 Sialoadenitis, unspecified: Secondary | ICD-10-CM

## 2022-06-10 DIAGNOSIS — J029 Acute pharyngitis, unspecified: Secondary | ICD-10-CM | POA: Diagnosis not present

## 2022-06-10 LAB — CBC WITH DIFFERENTIAL/PLATELET
Abs Immature Granulocytes: 0.04 10*3/uL (ref 0.00–0.07)
Basophils Absolute: 0.1 10*3/uL (ref 0.0–0.1)
Basophils Relative: 1 %
Eosinophils Absolute: 0.2 10*3/uL (ref 0.0–0.5)
Eosinophils Relative: 2 %
HCT: 38.8 % (ref 36.0–46.0)
Hemoglobin: 12.3 g/dL (ref 12.0–15.0)
Immature Granulocytes: 0 %
Lymphocytes Relative: 41 %
Lymphs Abs: 5 10*3/uL — ABNORMAL HIGH (ref 0.7–4.0)
MCH: 28.5 pg (ref 26.0–34.0)
MCHC: 31.7 g/dL (ref 30.0–36.0)
MCV: 90 fL (ref 80.0–100.0)
Monocytes Absolute: 0.8 10*3/uL (ref 0.1–1.0)
Monocytes Relative: 6 %
Neutro Abs: 6.1 10*3/uL (ref 1.7–7.7)
Neutrophils Relative %: 50 %
Platelets: 206 10*3/uL (ref 150–400)
RBC: 4.31 MIL/uL (ref 3.87–5.11)
RDW: 15 % (ref 11.5–15.5)
WBC: 12.3 10*3/uL — ABNORMAL HIGH (ref 4.0–10.5)
nRBC: 0 % (ref 0.0–0.2)

## 2022-06-10 LAB — COMPREHENSIVE METABOLIC PANEL
ALT: 22 U/L (ref 0–44)
AST: 48 U/L — ABNORMAL HIGH (ref 15–41)
Albumin: 3.6 g/dL (ref 3.5–5.0)
Alkaline Phosphatase: 137 U/L — ABNORMAL HIGH (ref 38–126)
Anion gap: 6 (ref 5–15)
BUN: 7 mg/dL — ABNORMAL LOW (ref 8–23)
CO2: 24 mmol/L (ref 22–32)
Calcium: 8.5 mg/dL — ABNORMAL LOW (ref 8.9–10.3)
Chloride: 107 mmol/L (ref 98–111)
Creatinine, Ser: 0.84 mg/dL (ref 0.44–1.00)
GFR, Estimated: >60 mL/min (ref >60)
Glucose, Bld: 85 mg/dL (ref 70–99)
Potassium: 3.4 mmol/L — ABNORMAL LOW (ref 3.5–5.1)
Sodium: 137 mmol/L (ref 135–145)
Total Bilirubin: 0.6 mg/dL (ref 0.3–1.2)
Total Protein: 8.2 g/dL — ABNORMAL HIGH (ref 6.5–8.1)

## 2022-06-10 LAB — GROUP A STREP BY PCR: Group A Strep by PCR: NOT DETECTED

## 2022-06-10 MED ORDER — LACTATED RINGERS IV BOLUS
1000.0000 mL | Freq: Once | INTRAVENOUS | Status: AC
  Administered 2022-06-11: 1000 mL via INTRAVENOUS

## 2022-06-10 MED ORDER — KETOROLAC TROMETHAMINE 30 MG/ML IJ SOLN
15.0000 mg | Freq: Once | INTRAMUSCULAR | Status: AC
  Administered 2022-06-11: 15 mg via INTRAVENOUS
  Filled 2022-06-10: qty 1

## 2022-06-10 MED ORDER — AMOXICILLIN-POT CLAVULANATE 875-125 MG PO TABS
1.0000 | ORAL_TABLET | Freq: Once | ORAL | Status: AC
  Administered 2022-06-11: 1 via ORAL
  Filled 2022-06-10: qty 1

## 2022-06-10 MED ORDER — IOHEXOL 300 MG/ML  SOLN: 100.0000 mL | Freq: Once | INTRAMUSCULAR | Status: DC | PRN

## 2022-06-10 MED ORDER — IOHEXOL 300 MG/ML  SOLN
75.0000 mL | Freq: Once | INTRAMUSCULAR | Status: AC | PRN
  Administered 2022-06-10: 75 mL via INTRAVENOUS

## 2022-06-10 MED ORDER — MORPHINE SULFATE (PF) 4 MG/ML IV SOLN
4.0000 mg | Freq: Once | INTRAVENOUS | Status: AC
  Administered 2022-06-11: 4 mg via INTRAVENOUS
  Filled 2022-06-10: qty 1

## 2022-06-10 NOTE — Discharge Instructions (Addendum)
Please take the Augmentin antibiotics twice daily.  It is important to finish this course of antibiotics.  Use Tylenol for pain and fevers.  Up to 1000 mg per dose, up to 4 times per day.  Do not take more than 4000 mg of Tylenol/acetaminophen within 24 hours..  Take the oxycodone pain medicine as needed for severe/breakthrough pain.  Do not mix oxycodone and tramadol.  Warm compresses and sour candies are actually helpful, please actually do this.  Attached is the information for the ear nose throat doctor

## 2022-06-10 NOTE — ED Provider Triage Note (Signed)
  Emergency Medicine Provider Triage Evaluation Note  Sharon [name] , a 66 y.o.female,  was evaluated in triage.  Pt complains of right-sided neck swelling.  Patient states that she has a history of getting salivary stones causing swelling in her face.  However, she states that the swelling is different with extension into the neck/submandibular space.  Denies any other symptoms at this time.   Review of Systems  Positive: Face/neck swelling Negative: Denies fever, chest pain, vomiting  Physical Exam  There were no vitals filed for this visit. Gen:   Awake, no distress   Resp:  Normal effort  MSK:   Moves extremities without difficulty  Other:  Significant swelling along the right side of the face with extension into the submandibular space.  No trismus or drooling.  Medical Decision Making  Given the patient's initial medical screening exam, the following diagnostic evaluation has been ordered. The patient will be placed in the appropriate treatment space, once one is available, to complete the evaluation and treatment. I have discussed the plan of care with the patient and I have advised the patient that an ED physician or mid-level practitioner will reevaluate their condition after the test results have been received, as the results may give them additional insight into the type of treatment they may need.    Diagnostics: CT neck, labs  Treatments: none immediately   Teodoro Spray, [place] 06/10/22 1900

## 2022-06-10 NOTE — ED Triage Notes (Signed)
Pain and swelling to throat and neck since yesterday. Right neck swollen and red, warm to touch. No resp difficulty noted. Self maintained open and protected airway at this time with no signs of impending compromise. Pt speaking in clear and complete sentences. Able to cough and clear secretions.

## 2022-06-10 NOTE — ED Provider Notes (Signed)
[facility] Provider Note    Event Date/Time   First MD Initiated Contact with Patient 06/10/22 2317     (approximate)   History   Sore Throat   HPI  Sharon [name] is a 66 y.o. female who presents to the ED for evaluation of Sore Throat   I reviewed clinic visit from yesterday where she was evaluated for a few days of right-sided submandibular swelling, diagnosed with sialolithiasis, provided tramadol, Augmentin and referred to ENT.  Patient presents to the ED for evaluation of persistent symptoms and concerned that the previous physician from the clinic was "short" with her.  She is upset that she was given tramadol and does not like tramadol.  She has not taken the Augmentin yet this evening.  She reports concern that she would be referred to a [facility] ENT doctor, she does not want to "have anything to do with [facility]."   Denies any fevers, difficulty breathing or swallowing.  Physical Exam   Triage Vital Signs: ED Triage Vitals [06/10/22 2055]  Enc Vitals Group     BP 132/70     Pulse Rate 68     Resp 18     Temp 98.4 F (36.9 C)     Temp Source Oral     SpO2 99 %     Weight 174 lb (78.9 kg)     Height 5' 1"  (1.549 m)     Head Circumference      Peak Flow      Pain Score 4     Pain Loc      Pain Edu?      Excl. in [place]?     Most recent vital signs: Vitals:   06/10/22 2055  BP: 132/70  Pulse: 68  Resp: 18  Temp: 98.4 F (36.9 C)  SpO2: 99%    General: Awake, no distress.  CV:  Good peripheral perfusion.  Resp:  Normal effort.  Abd:  No distention.  MSK:  No deformity noted.  Neuro:  No focal deficits appreciated. Other:  Obvious signs of the swelling on the right, but she is conversational full sentences with normal voice.  Uvula is midline.  Tenderness to the right mandibular area.   ED Results / Procedures / Treatments   Labs (all labs ordered are listed, but only abnormal results are displayed) Labs Reviewed  CBC WITH  DIFFERENTIAL/PLATELET - Abnormal; Notable for the following components:      Result Value   WBC 12.3 (*)    Lymphs Abs 5.0 (*)    All other components within normal limits  COMPREHENSIVE METABOLIC PANEL - Abnormal; Notable for the following components:   Potassium 3.4 (*)    BUN 7 (*)    Calcium 8.5 (*)    Total Protein 8.2 (*)    AST 48 (*)    Alkaline Phosphatase 137 (*)    All other components within normal limits  GROUP A STREP BY PCR    EKG   RADIOLOGY CT soft tissue neck interpreted by me with right submandibular swelling and inflammatory changes consistent with sialoadenitis.  Official radiology report(s): CT Soft Tissue Neck W Contrast  Result Date: 06/10/2022 CLINICAL DATA:  Soft tissue swelling EXAM: CT NECK WITH CONTRAST TECHNIQUE: Multidetector CT imaging of the neck was performed using the standard protocol following the bolus administration of intravenous contrast. RADIATION DOSE REDUCTION: This exam was performed according to the departmental dose-optimization program which includes automated exposure control, adjustment of  the mA and/or kV according to patient size and/or use of iterative reconstruction technique. CONTRAST:  87m OMNIPAQUE IOHEXOL 300 MG/ML  SOLN COMPARISON:  None Available. FINDINGS: Pharynx and larynx: Normal. No mass or swelling. Salivary glands: Right submandibular sialolithiasis measuring 5 mm. There is inflammatory change of the right submandibular gland and adjacent soft tissues that extends into the lower neck. Left submandibular gland and parotid glands are normal. Thyroid: Normal. Lymph nodes: None enlarged or abnormal density. Vascular: Negative. Limited intracranial: Negative. Visualized orbits: Negative. Mastoids and visualized paranasal sinuses: Clear. Skeleton: No acute or aggressive process. Upper chest: Negative. Other: Inflammatory straightening superficial to the right sternoclavicular joint. IMPRESSION: 1. Right submandibular  sialolithiasis with inflammatory change of the right submandibular gland and adjacent soft tissues that extends into the lower neck, consistent with acute sialoadenitis. 2. Inflammatory change superficial to the right sternoclavicular joint, indeterminate. This could be secondary to sternoclavicular arthritis, though the joint space appears normal. The location is remote from the right submandibular inflammation, but some reactive change is also a possibility. Electronically Signed   By: KUlyses JarredM.D.   On: 06/10/2022 22:22    PROCEDURES and INTERVENTIONS:  Procedures  Medications  iohexol (OMNIPAQUE) 300 MG/ML solution 75 mL (75 mLs Intravenous Contrast Given 06/10/22 2146)     IMPRESSION / MDM / ASSESSMENT AND PLAN / ED COURSE  I reviewed the triage vital signs and the nursing notes.  Differential diagnosis includes, but is not limited to, PTA, upper airway obstruction, sialoadenitis, cellulitis  {Patient presents with symptoms of an acute illness or injury that is potentially life-threatening.  Patient presents to the ED with persistent symptoms of sial adenitis suitable for outpatient management.  She looks well to me, has noted swelling to the area, but no signs of upper airway obstruction.  Vitals are reassuring.  Blood work with a mild leukocytosis.  Group A strep is negative and no significant metabolic derangements.  CT confirms sialoadenitis without other upper airway pathologies.  Urged her to actually take antibiotics, discussed other conservative measures and referred her to a different ENT clinic.  Discussed return precautions.      FINAL CLINICAL IMPRESSION(S) / ED DIAGNOSES   Final diagnoses:  None     Rx / DC Orders   ED Discharge Orders     None        Note:  This document was prepared using Dragon voice recognition software and may include unintentional dictation errors.   SVladimir Crofts MD 06/11/22 [id number]

## 2022-06-11 MED ORDER — OXYCODONE HCL 5 MG PO TABS: 5.0000 mg | ORAL_TABLET | Freq: Three times a day (TID) | ORAL | 0 refills | Status: DC | PRN

## 2022-06-11 NOTE — ED Notes (Signed)
Pt dc to home. Dc instructions reviewed with all questions answered. Pt verbalizes understanding. Iv removed, cath intact. Pressure dressing applied. No bleeding noted at site. Pt ambulatory out of dept with steady gait

## 2022-06-12 ENCOUNTER — Encounter: Payer: Self-pay | Admitting: Physician Assistant

## 2022-06-22 ENCOUNTER — Other Ambulatory Visit: Payer: Self-pay

## 2022-06-22 MED ORDER — AJOVY 225 MG/1.5ML ~~LOC~~ SOAJ
SUBCUTANEOUS | 10 refills | Status: DC
  Filled 2022-06-22: qty 4.5, 84d supply, fill #0
  Filled 2022-06-23: qty 1.5, 30d supply, fill #0
  Filled 2022-07-15: qty 1.5, 28d supply, fill #0

## 2022-06-23 ENCOUNTER — Other Ambulatory Visit: Payer: Self-pay

## 2022-06-27 ENCOUNTER — Other Ambulatory Visit: Payer: Self-pay

## 2022-07-07 ENCOUNTER — Other Ambulatory Visit: Payer: Self-pay

## 2022-07-14 ENCOUNTER — Other Ambulatory Visit: Payer: Self-pay

## 2022-07-14 MED ORDER — METOPROLOL TARTRATE 50 MG PO TABS
50.0000 mg | ORAL_TABLET | Freq: Two times a day (BID) | ORAL | 0 refills | Status: DC
  Filled 2022-07-14: qty 60, 30d supply, fill #0

## 2022-07-14 MED ORDER — NORTRIPTYLINE HCL 25 MG PO CAPS
25.0000 mg | ORAL_CAPSULE | Freq: Every evening | ORAL | 0 refills | Status: DC
  Filled 2022-07-14: qty 30, 30d supply, fill #0

## 2022-07-14 MED ORDER — NORTRIPTYLINE HCL 75 MG PO CAPS
75.0000 mg | ORAL_CAPSULE | Freq: Every evening | ORAL | 0 refills | Status: DC
  Filled 2022-07-14: qty 30, 30d supply, fill #0

## 2022-07-15 ENCOUNTER — Other Ambulatory Visit: Payer: Self-pay

## 2022-07-19 ENCOUNTER — Encounter: Payer: Self-pay | Admitting: Family Medicine

## 2022-07-19 ENCOUNTER — Other Ambulatory Visit: Payer: Self-pay

## 2022-07-19 ENCOUNTER — Ambulatory Visit: Payer: 59 | Admitting: Family Medicine

## 2022-07-19 VITALS — BP 116/66 | HR 91 | Temp 97.0°F | Ht 62.0 in | Wt 164.4 lb

## 2022-07-19 DIAGNOSIS — I10 Essential (primary) hypertension: Secondary | ICD-10-CM

## 2022-07-19 DIAGNOSIS — K50919 Crohn's disease, unspecified, with unspecified complications: Secondary | ICD-10-CM

## 2022-07-19 DIAGNOSIS — Z8542 Personal history of malignant neoplasm of other parts of uterus: Secondary | ICD-10-CM | POA: Insufficient documentation

## 2022-07-19 DIAGNOSIS — Z1231 Encounter for screening mammogram for malignant neoplasm of breast: Secondary | ICD-10-CM

## 2022-07-19 DIAGNOSIS — F32A Depression, unspecified: Secondary | ICD-10-CM

## 2022-07-19 DIAGNOSIS — G43909 Migraine, unspecified, not intractable, without status migrainosus: Secondary | ICD-10-CM

## 2022-07-19 DIAGNOSIS — F419 Anxiety disorder, unspecified: Secondary | ICD-10-CM | POA: Diagnosis not present

## 2022-07-19 DIAGNOSIS — E785 Hyperlipidemia, unspecified: Secondary | ICD-10-CM

## 2022-07-19 MED ORDER — ROSUVASTATIN CALCIUM 5 MG PO TABS
5.0000 mg | ORAL_TABLET | Freq: Every day | ORAL | 3 refills | Status: DC
  Filled 2022-07-19: qty 90, 90d supply, fill #0
  Filled 2022-11-20: qty 90, 90d supply, fill #1

## 2022-07-19 NOTE — Progress Notes (Signed)
[facility] [address] [place] [address] Dept: [phone] Dept Fax: [phone]  New Patient Office Visit  Subjective:    Patient ID: Sharon [name], female    DOB: 1955/09/03, 66 y.o..   MRN: [id number]  Chief Complaint  Patient presents with   Establish Care    NP-establish care.  C/o having skin issues and needs new referral to GI     History of Present Illness:  Patient is in today to establish care. Sharon [name] was born in [place], [place]. She grew up mainly between [place] and [place], though she finished high school in [place].  She attended [employer] a BA in psychology. She works as a Electrical engineer for [employer]. She is divorced. She has three children (32, 39, 47), 8 grandchildren,a nd 1 great-grandchild. She denies use of tobacco, alcohol, or drugs.  Sharon [name] Has a history of Crohn's disease. She has multiple family members that have had this as well. She has been tried on various biologics for this, including Remicade and more recently Entyvio. She notes side effects that she could not tolerate. Due to job changes and relocation, she has been planning to switch to a different gastroenterologist.  Sharon [name] has a history of anxiety and depression . She is currently managed on escitalopram 20 mg daily. She feels this is working well for her.  Sharon [name] has a history of migraine headaches. She is managed on nortriptyline 100 mg at bedtime and fremanezumab (Ajovy). She is followed by Dr. Ernst Bowler (neurology).  Sharon [name] has a history of hypertension. She is managed on metoprolol tartrate 50 mg bid.  Sharon [name] has a history of GERD. She is managed on omeprazole 20 mg daily.  Sharon [name] has a history of hyperlipidemia. She is manage don rosuvastatin 5 mg daily.  Sharon [name] has a history of Vitamin B12 deficiency. She takes a daily Vit B12 supplement.  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   HLD (hyperlipidemia) 02/12/2013   Irritable bowel syndrome 11/22/2012   Takotsubo syndrome    Bilateral hearing loss 06/27/2012   GERD (gastroesophageal reflux disease)    Headache 10/13/2011   Leukocytosis 08/16/2011   Vitamin B12 deficiency    Essential hypertension    Migraines    Crohn's disease ([place])    Anxiety and depression    Ex-smoker    TIA (transient ischemic attack)    Hepatic steatosis 04/30/2011   Past Surgical History:  Procedure Laterality Date   [place]  07/2012   normal LV fxn, widely patent coronaries   CERVICAL BIOPSY  W/ LOOP ELECTRODE EXCISION  10/2008   COLON RESECTION  10/2014   removal of scar tissue colon from prior surgery ([facility] at [facility])   COLONOSCOPY  10/2008   ileo-colonic anastomosis, ielocolonic crohn's   COLONOSCOPY  05/2011   ileo-colonic anastomosis normal, normal mucosa   COLONOSCOPY  10/15/2012   focal inflammation at anastomosis, no active crohn's, planning MR enterograph [name])   COLONOSCOPY  09/2014   [place]   ESOPHAGOGASTRODUODENOSCOPY  05/2011   nl esophagus, gastritis, nl duodenum   KNEE SURGERY Left    LEFT HEART  CATH AND CORONARY ANGIOGRAPHY Left 04/15/2020   Procedure: LEFT HEART CATH AND CORONARY ANGIOGRAPHY;  Surgeon: Yolonda Kida, MD;  Location: [facility];  Service: Cardiovascular;  Laterality: Left;   LEFT HEART CATHETERIZATION WITH CORONARY ANGIOGRAM N/A 08/01/2012   Procedure: LEFT HEART CATHETERIZATION WITH CORONARY ANGIOGRAM;  Surgeon: Sherren Mocha, MD;  Location: [facility] CATH LAB;  Service: Cardiovascular;  Laterality: N/A;   RIGHT COLECTOMY  2000   crohn's disease, ileocecal  resection   SHOULDER ARTHROSCOPY W/ ROTATOR CUFF REPAIR Right 05/31/2012   [facility]o Tamera Punt)   US ECHOCARDIOGRAPHY  05/2010   nl LV fxn ,EF 60%   VAGINAL HYSTERECTOMY  12/2008   LAVH/BSO   Family History  Problem Relation Age of Onset   Stroke Mother    Hypertension Mother    Hyperlipidemia Mother    Hypertension Father    Crohn's disease Father    Cancer Sister        ovarian   Crohn's disease Sister    Crohn's disease Daughter    Ulcerative colitis Daughter    Cancer Paternal Aunt        Brain   Crohn's disease Paternal Aunt    Crohn's disease Paternal Uncle    Stroke Paternal Grandmother    Diabetes Neg Hx    Outpatient Medications Prior to Visit  Medication Sig Dispense Refill   acetaminophen (TYLENOL) 500 MG tablet Take 1,000-1,500 mg by mouth 2 (two) times daily as needed for moderate pain or headache.     aspirin EC 81 MG tablet Take 1 tablet (81 mg total) by mouth daily. Over the counter 30 tablet 3   Butalbital-APAP-Caffeine 50-325-40 MG capsule Take 1 capsule by mouth 3 (three) times daily as needed.     Carboxymethylcellul-Glycerin (LUBRICATING EYE DROPS OP) Place 1 drop into both eyes daily as needed (dry eyes).     diphenhydrAMINE (BENADRYL) 25 mg capsule Take 50 mg by mouth every 8 (eight) hours as needed for allergies.      escitalopram (LEXAPRO) 20 MG tablet Take 1 tablet (20 mg total) by mouth daily. 30 tablet 1   Fremanezumab-vfrm (AJOVY) 225 MG/1.5ML SOAJ Inject 216m subcutaneously monthly 4.5 mL 10   Melatonin 10 MG CAPS Take 10 mg by mouth at bedtime.     metoprolol tartrate (LOPRESSOR) 50 MG tablet Take 1 tablet (50 mg total) by mouth 2 (two) times daily for 30 days 60 tablet 0   nortriptyline (PAMELOR) 25 MG capsule TAKE 1 CAPSULE BY MOUTH NIGHTLY WITH 75MG CAPSULE TO EQUAL 100MG NIGHTLY 30 capsule 0   nortriptyline (PAMELOR) 75 MG capsule Take 1 capsule (75 mg total) by mouth at bedtime Take with 221mtablet to equal 10071mightly 30 capsule 0    omeprazole (PRILOSEC) 20 MG capsule Take 20 mg by mouth daily.     traMADol (ULTRAM) 50 MG tablet Take 50 mg by mouth every 6 (six) hours as needed.     vitamin B-12 (CYANOCOBALAMIN) 500 MCG tablet Take 500 mcg by mouth daily.     escitalopram (LEXAPRO) 20 MG tablet TAKE 1 TABLET BY MOUTH ONCE DAILY (Patient taking differently: Take 20 mg by mouth daily.) 30 tablet 4   nortriptyline (PAMELOR) 25 MG capsule Take 25 mg by mouth at bedtime.     nortriptyline (PAMELOR) 75 MG capsule Take 75 mg by mouth at bedtime.     rosuvastatin (CRESTOR) 5 MG tablet Take 5 mg by mouth at bedtime.     vedolizumab (ENTYVIO) 300  MG injection Inject into the vein every 8 (eight) weeks.  (Patient not taking: Reported on 07/19/2022)     oxyCODONE (ROXICODONE) 5 MG immediate release tablet Take 1 tablet (5 mg total) by mouth every 8 (eight) hours as needed for severe pain or breakthrough pain. 6 tablet 0   No facility-administered medications prior to visit.    Allergies  Allergen Reactions   Infliximab Dermatitis   Codeine Nausea And Vomiting   Flagyl [Metronidazole] Nausea And Vomiting   Hydrocodone Nausea And Vomiting   Ibuprofen Other (See Comments)    Pt states that she is unable to take due to her Crohn's disease.   Remeron [Mirtazapine] Other (See Comments)    Reaction:  Makes pt lethargic    Objective:   Today's Vitals   07/19/22 0925  BP: 116/66  Pulse: 91  Temp: (!) 97 F (36.1 C)  TempSrc: Temporal  SpO2: 95%  Weight: 164 lb 6.4 oz (74.6 kg)  Height: 5' 2"  (1.575 m)   Body mass index is 30.07 kg/m.   General: Well developed, well nourished. No acute distress. Psych: Alert and oriented. Normal mood and affect.  Health Maintenance Due  Topic Date Due   Zoster Vaccines- Shingrix (1 of 2) Never done   MAMMOGRAM  01/31/2019   PAP SMEAR-Modifier  02/13/2019   Lab Results Last CBC Lab Results  Component Value Date   WBC 12.3 (H) 06/10/2022   HGB 12.3 06/10/2022   HCT 38.8  06/10/2022   MCV 90.0 06/10/2022   MCH 28.5 06/10/2022   RDW 15.0 06/10/2022   PLT 206 74/16/3845   Last metabolic panel Lab Results  Component Value Date   GLUCOSE 85 06/10/2022   NA 137 06/10/2022   K 3.4 (L) 06/10/2022   CL 107 06/10/2022   CO2 24 06/10/2022   BUN 7 (L) 06/10/2022   CREATININE 0.84 06/10/2022   GFRNONAA >60 06/10/2022   CALCIUM 8.5 (L) 06/10/2022   PROT 8.2 (H) 06/10/2022   ALBUMIN 3.6 06/10/2022   BILITOT 0.6 06/10/2022   ALKPHOS 137 (H) 06/10/2022   AST 48 (H) 06/10/2022   ALT 22 06/10/2022   ANIONGAP 6 06/10/2022        07/19/2022   10:01 AM 03/27/2018   12:53 PM 01/24/2018   12:54 PM  Depression screen PHQ 2/9  Decreased Interest 1 1 1   Down, Depressed, Hopeless 1 1 1   PHQ - 2 Score 2 2 2   Altered sleeping 0 3 3  Tired, decreased energy 1 3 3   Change in appetite 1 0 0  Feeling bad or failure about yourself  1 1 0  Trouble concentrating 0 2 3  Moving slowly or fidgety/restless 1 0 3  Suicidal thoughts 0 0 0  PHQ-9 Score 6 11 14   Difficult doing work/chores Somewhat difficult        07/19/2022   10:01 AM 03/27/2018   12:53 PM 01/24/2018   12:54 PM 11/09/2015    4:48 PM  GAD 7 : Generalized Anxiety Score  Nervous, Anxious, on Edge 1 1 3 3   Control/stop worrying 1 1 3 1   Worry too much - different things 0 1 0 1  Trouble relaxing 0 3 0 1  Restless 1 0 2 1  Easily annoyed or irritable 1 1 2 3   Afraid - awful might happen 0 1 2 1   Total GAD 7 Score 4 8 12 11   Anxiety Difficulty Not difficult at all      Assessment &  Plan:   1. Essential hypertension Blood pressure is at goal. Continue metoprolol tartrate 50 mg bid.  2. Migraine without status migrainosus, not intractable, unspecified migraine type Managed by neurology. She will follow-up with Dr. Ernst Bowler regarding Arie Sabina prescription. She should continue nortriptyline 75 mg and 25 mg at bedtime.  3. Crohn's disease with complication, unspecified gastrointestinal tract location [facility]) I  will refer Ms. Vololato to a new gastroenterologist within the [facility] system.  - Ambulatory referral to Gastroenterology  4. Anxiety and depression Stable. Continue Lexapro 20 mg daily.  5. Hyperlipidemia, unspecified hyperlipidemia type I will continue rosuvastatin.  - rosuvastatin (CRESTOR) 5 MG tablet; Take 1 tablet (5 mg total) by mouth at bedtime.  Dispense: 90 tablet; Refill: 3  6. Encounter for screening mammogram for malignant neoplasm of breast  - MM DIGITAL SCREENING BILATERAL; Future   Return in about 3 months (around 10/19/2022) for Reassessment.   Haydee Salter, MD

## 2022-08-09 ENCOUNTER — Ambulatory Visit: Payer: 59 | Admitting: Gastroenterology

## 2022-08-10 ENCOUNTER — Encounter: Payer: Self-pay | Admitting: Family Medicine

## 2022-08-11 MED ORDER — NORTRIPTYLINE HCL 25 MG PO CAPS
25.0000 mg | ORAL_CAPSULE | Freq: Every evening | ORAL | 3 refills | Status: DC
  Filled 2022-08-16: qty 90, 90d supply, fill #0
  Filled 2022-11-20: qty 90, 90d supply, fill #1

## 2022-08-11 MED ORDER — NORTRIPTYLINE HCL 75 MG PO CAPS
75.0000 mg | ORAL_CAPSULE | Freq: Every evening | ORAL | 3 refills | Status: DC
  Filled 2022-08-16: qty 30, 30d supply, fill #0
  Filled 2022-09-27 – 2022-10-14 (×2): qty 30, 30d supply, fill #1
  Filled 2022-11-20: qty 30, 30d supply, fill #2

## 2022-08-11 NOTE — Telephone Encounter (Signed)
Refill request for  Nortriptyline 25 mg LR 07/14/22, #30, 0 rf by Gay Filler, PA  Nortriptyline 75 mg LR 07/14/22, #30, 0 rf by Gay Filler, PA LOV 07/19/22 FOV  10/19/22  Please review and advise.  Thanks. Dm/cma

## 2022-08-15 MED ORDER — ESCITALOPRAM OXALATE 20 MG PO TABS
20.0000 mg | ORAL_TABLET | Freq: Every day | ORAL | 3 refills | Status: DC
  Filled 2022-08-16: qty 90, 90d supply, fill #0
  Filled 2022-11-20: qty 90, 90d supply, fill #1

## 2022-08-15 NOTE — Addendum Note (Signed)
Addended by: Konrad Saha on: 08/15/2022 02:45 PM   Modules accepted: Orders

## 2022-08-15 NOTE — Telephone Encounter (Signed)
Please review and advise. Thanks. Dm/cma  

## 2022-08-16 ENCOUNTER — Telehealth: Payer: Self-pay | Admitting: Family Medicine

## 2022-08-16 ENCOUNTER — Other Ambulatory Visit: Payer: Self-pay

## 2022-08-16 NOTE — Telephone Encounter (Signed)
FYI:Pt is wanting all her scripts sent to [facility], [address], [place], [place] [address] Hours:  Open ? Closes 6?PM Phone: [phone]

## 2022-08-17 NOTE — Telephone Encounter (Signed)
All prescriptions prescribed by Dr Gena Fray have been sent to [facility].  Dm/cma

## 2022-08-18 ENCOUNTER — Other Ambulatory Visit: Payer: Self-pay

## 2022-08-18 ENCOUNTER — Encounter: Payer: Self-pay | Admitting: Gastroenterology

## 2022-08-18 ENCOUNTER — Ambulatory Visit: Payer: 59 | Admitting: Gastroenterology

## 2022-08-18 VITALS — BP 144/57 | HR 58 | Temp 97.6°F | Ht 62.0 in | Wt 169.5 lb

## 2022-08-18 DIAGNOSIS — K50012 Crohn's disease of small intestine with intestinal obstruction: Secondary | ICD-10-CM

## 2022-08-18 DIAGNOSIS — K5080 Crohn's disease of both small and large intestine without complications: Secondary | ICD-10-CM

## 2022-08-18 MED ORDER — NA SULFATE-K SULFATE-MG SULF 17.5-3.13-1.6 GM/177ML PO SOLN
354.0000 mL | Freq: Once | ORAL | 0 refills | Status: AC
  Filled 2022-08-18: qty 354, 1d supply, fill #0

## 2022-08-18 NOTE — Progress Notes (Signed)
Cephas Darby, MD [address]  [place]  [place], [place] [address]  Main: [phone]  Fax: [phone]    Gastroenterology Consultation  Referring Provider:     Haydee Salter, MD Primary Care Physician:  Haydee Salter, MD Primary Gastroenterologist:  Dr. Cephas Darby Reason for Consultation: Crohn's disease        HPI:   Sharon [name] is a 66 y.o. female referred by Dr. Gena Fray, Lillette Boxer, MD  for consultation & management of small bowel Crohn's, history of ileocolonic resection with primary anastomosis.  Patient was previously seen by [facility] gastroenterology, was on Entyvio monotherapy every 8 weeks.  She self discontinued Entyvio in June 2023 because she was worried about flareup of skin lesions as well as logistics in receiving the infusion which involved commuting to [place].  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  She has been noticing flareup of her GI symptoms including diarrhea, rectal bleeding, urgency and some weight loss, loss of appetite.  Crohn's disease classification:  Age: > 40 Location: Ileal or colonic or ileocolonic or only upper GI Behavior: non stricturing, non penetrating or stricturing or penetrating  Perianal: Yes or no  IBD diagnosis: Year 2000 which presented with small bowel obstruction  Disease course:  Crohn's ileitis diagnosed in 2000, stricturing phenotype and for which she has undergone ileal resection in 2000.  In 2016 she had further episodes of obstruction and she underwent a resection in February 2016. She clearly improved after surgery. She has failed Certolizumab (primary non-responder), Infliximab (blisters). She was taking 6-MP in combination with Vedolizumab for some time after her 2016 surgery but this was discontinued.  Patient was initially followed by Dr. Koleen Distance at [facility] until 2020 when she switched her care to [facility].  Ms. Rooke started Vedolizumab monotherapy in 2020.  This has resulted  in clinical remission. In 01/2019, MRE revealed no active Crohn's and hepatic steatosis. In 04/2019, colonoscopy revealed moderate (i2) inflammation in neo-TI. Anastomosis in [facility] noted. Normal colon.   Extra intestinal manifestations: None  IBD surgical history: Ileocolonic resection in a year 2000  Imaging:  MRE 02/15/2019 No evidence of active inflammatory bowel disease, diffuse hepatic steatosis CTE 11/14/2021 No CT evidence of active disease, diffuse hepatic steatosis SBFT none  Procedures:  Colonoscopy 04/2019, revealed moderate (i2) inflammation in neo-TI. Anastomosis in [facility] noted. Normal colon. Impression: - Perianal skin tags found on perianal exam. - Crohn's disease with ileitis. Biopsied. - Patent side-to-side ileo-colonic anastomosis,  characterized by healthy appearing mucosa. - The examination was otherwise normal on direct and  retroflexion views. - Biopsies for surveillance were taken from the entire  colon.  A.  Ileum, endoscopic biopsy: Small intestinal mucosa with no significant pathologic alteration. No active inflammation, chronic colitis, or granuloma formation is seen. Negative for dysplasia.   B.  Right colon, endoscopic biopsy: Colonic mucosa with no significant pathologic alteration. No active inflammation, chronic colitis, or granuloma formation is seen. Negative for dysplasia.   C.  Transverse colon, endoscopic biopsy: Colonic mucosa with no significant pathologic alteration. No active inflammation, chronic colitis, or granuloma formation is seen. Negative for dysplasia.   D.  Left colon, endoscopic biopsy: Colonic mucosa with no significant pathologic alteration. No active inflammation, chronic colitis, or granuloma formation is seen. Negative for dysplasia.  Colonoscopy at [facility] 10/07/2015 NEOTERMINAL ILEUM, BIOPSIES:       Enteric mucosa with no significant diagnostic abnormality.  No active inflammation, granulomas or  dysplasia.   Upper Endoscopy none  VCE none  IBD medications:  Steroids: Prednisone, budesonide 5-ASA: None  Immunomodulators: Received Imuran, methotrexate none TPMT status unknown Biologics:  Anti TNFs: Received Cimzia, primary nonresponder.  Remicade resulted in skin blisters Anti Integrins: Entyvio initiated in 2016 after second resection, later discontinued.  Restarted in 2020 as monotherapy every 8 weeks, self discontinued in 01/2022, Ustekinumab: Tofactinib: Clinical trial:   NSAIDs: None  Antiplts/Anticoagulants/Anti thrombotics: None   Past Medical History:  Diagnosis Date   Anxiety and depression    Arthritis    B12 deficiency    pernicious anemia - monthly b12 shots   Cervical cancer ([place]) 12/2008   s/p hysterectomy   Crohn's disease ([place]) 2000   h/o stenotic crohn's ileitis, active in TI s/p resections 2000, 2016 PPD neg ([facility] Dr. [name]); has been on cimzia, remicade, 6MP, entocort, now stable on Entyvio ([facility] at [facility])   Genital warts    GERD (gastroesophageal reflux disease)    severe, daily sxs if off PPI   Hepatic steatosis 04/2011   diffuse on CT, 56m gallbladder polyp   History of anemia    attributed to crohn's   History of chicken pox    History of syphilis 1980s   HTN (hypertension)    Migraines    and frequent other headaches (sinus or stress)   Scalp psoriasis    Sensorineural hearing loss of both ears 12/2012   high freq   Takotsubo syndrome 07/2012   cath 08/01/12, normal coronaries, LVEF 65%   [facility] (w/u negative - carotids, echo, TC doppler, and MRI normal)   Tobacco abuse     Past Surgical History:  Procedure Laterality Date   ABig [place] 07/2012   normal LV fxn, widely patent coronaries   CERVICAL BIOPSY  W/ LOOP ELECTRODE EXCISION  10/2008   COLON RESECTION  10/2014   removal of scar tissue [facility] from [facility]  [facility]   COLONOSCOPY  10/2008   ileo-colonic anastomosis, ielocolonic crohn's   COLONOSCOPY  05/2011   ileo-colonic anastomosis normal, normal mucosa   COLONOSCOPY  10/15/2012   focal inflammation at anastomosis, no active crohn's, planning MR enterograph ([name]   COLONOSCOPY  09/2014   [facility]   ESOPHAGOGASTRODUODENOSCOPY  05/2011   nl esophagus, gastritis, nl duodenum   KNEE SURGERY Left    LEFT HEART CATH AND CORONARY ANGIOGRAPHY Left 04/15/2020   Procedure: LEFT HEART CATH AND CORONARY ANGIOGRAPHY;  Surgeon: CYolonda Kida MD;  [facility];  Service: Cardiovascular;  Laterality: Left;   LEFT HEART CATHETERIZATION WITH CORONARY ANGIOGRAM N/A 08/01/2012   Procedure: LEFT HEART CATHETERIZATION WITH CORONARY ANGIOGRAM;  Surgeon: MSherren Mocha MD;  [facility];  Service: Cardiovascular;  Laterality: N/A;   RIGHT COLECTOMY  2000   crohn's disease, ileocecal resection   SHOULDER ARTHROSCOPY W/ ROTATOR CUFF REPAIR Right 05/31/2012   [facility] (Tamera Punt   [place]  05/[place]   nl LV fxn ,EF 60%   VAGINAL HYSTERECTOMY  12/2008   LAVH/BSO     Current Outpatient Medications:    acetaminophen (TYLENOL) 500 MG tablet, Take 1,000-1,500 mg by mouth 2 (two) times daily as needed for moderate pain or headache., Disp: , Rfl:    aspirin EC 81 MG tablet, Take 1 tablet (81 mg total) by mouth daily. Over  the counter, Disp: 30 tablet, Rfl: 3   Carboxymethylcellul-Glycerin (LUBRICATING EYE DROPS OP), Place 1 drop into both eyes daily as needed (dry eyes)., Disp: , Rfl:    diphenhydrAMINE (BENADRYL) 25 mg capsule, Take 50 mg by mouth every 8 (eight) hours as needed for allergies. , Disp: , Rfl:    escitalopram (LEXAPRO) 20 MG tablet, Take 1 tablet (20 mg total) by mouth daily., Disp: 90 tablet, Rfl: 3   Fremanezumab-vfrm (AJOVY) 225 MG/1.5ML SOAJ, Inject 265m subcutaneously monthly, Disp: 4.5 mL, Rfl: 10   Melatonin 10 MG CAPS,  Take 10 mg by mouth at bedtime., Disp: , Rfl:    metoprolol tartrate (LOPRESSOR) 50 MG tablet, Take 1 tablet (50 mg total) by mouth 2 (two) times daily for 30 days, Disp: 60 tablet, Rfl: 0   Na Sulfate-K Sulfate-Mg Sulf 17.5-3.13-1.6 GM/177ML SOLN, Take 354 mLs by mouth once for 1 dose., Disp: 354 mL, Rfl: 0   nortriptyline (PAMELOR) 25 MG capsule, TAKE 1 CAPSULE BY MOUTH NIGHTLY WITH 75MG CAPSULE TO EQUAL 100MG NIGHTLY, Disp: 90 capsule, Rfl: 3   nortriptyline (PAMELOR) 75 MG capsule, Take 1 capsule (75 mg total) by mouth at bedtime Take with 233mtablet to equal 10070mightly, Disp: 90 capsule, Rfl: 3   omeprazole (PRILOSEC) 20 MG capsule, Take 20 mg by mouth daily., Disp: , Rfl:    rosuvastatin (CRESTOR) 5 MG tablet, Take 1 tablet (5 mg total) by mouth at bedtime., Disp: 90 tablet, Rfl: 3   traMADol (ULTRAM) 50 MG tablet, Take 50 mg by mouth every 6 (six) hours as needed., Disp: , Rfl:    vedolizumab (ENTYVIO) 300 MG injection, Inject into the vein every 8 (eight) weeks., Disp: , Rfl:    vitamin B-12 (CYANOCOBALAMIN) 500 MCG tablet, Take 500 mcg by mouth daily., Disp: , Rfl:    Family History  Problem Relation Age of Onset   Stroke Mother    Hypertension Mother    Hyperlipidemia Mother    Hypertension Father    Crohn's disease Father    Cancer Sister        ovarian   Crohn's disease Sister    Crohn's disease Daughter    Ulcerative colitis Daughter    Cancer Paternal Aunt        Brain   Crohn's disease Paternal Aunt    Crohn's disease Paternal Uncle    Stroke Paternal Grandmother    Diabetes Neg Hx      Social History   Tobacco Use   Smoking status: Former    Packs/day: 0.33    Years: 10.00    Total pack years: 3.30    Types: Cigarettes    Quit date: 11/14/2016    Years since quitting: 5.7   Smokeless tobacco: Never   Tobacco comments:    Quit within past yr  Vaping Use   Vaping Use: Never used  Substance Use Topics   Alcohol use: Yes    Comment: occ   Drug use: No     Comment: extensive drug use, remotely    Allergies as of 08/18/2022 - Review Complete 07/19/2022  Allergen Reaction Noted   Infliximab Dermatitis 12/10/2015   Codeine Nausea And Vomiting 05/16/2011   Flagyl [metronidazole] Nausea And Vomiting 07/15/2014   Hydrocodone Nausea And Vomiting 11/13/2012   Ibuprofen Other (See Comments) 05/23/2012   Remeron [mirtazapine] Other (See Comments) 10/13/2011    Review of Systems:    All systems reviewed and negative except where noted in HPI.   Physical  Exam:  BP (!) 144/57 (BP Location: Left Arm, Patient Position: Sitting, Cuff Size: Normal)   Pulse (!) 58   Temp 97.6 F (36.4 C) (Oral)   Ht 5' 2"  (1.575 m)   Wt 169 lb 8 oz (76.9 kg)   BMI 31.00 kg/m  No LMP recorded. Patient has had a hysterectomy.  General:   Alert,  Well-developed, well-nourished, pleasant and cooperative in NAD Head:  Normocephalic and atraumatic. Eyes:  Sclera clear, no icterus.   Conjunctiva pink. Ears:  Normal auditory acuity. Nose:  No deformity, discharge, or lesions. Mouth:  No deformity or lesions,oropharynx pink & moist. Neck:  Supple; no masses or thyromegaly. Lungs:  Respirations even and unlabored.  Clear throughout to auscultation.   No wheezes, crackles, or rhonchi. No acute distress. Heart:  Regular rate and rhythm; no murmurs, clicks, rubs, or gallops. Abdomen:  Normal bowel sounds. Soft, non-tender and non-distended without masses, hepatosplenomegaly or hernias noted.  No guarding or rebound tenderness.   Rectal: Not performed Msk:  Symmetrical without gross deformities. Good, equal movement & strength bilaterally. Pulses:  Normal pulses noted. Extremities:  No clubbing or edema.  No cyanosis. Neurologic:  Alert and oriented x3;  grossly normal neurologically. Skin:  Intact without significant lesions or rashes. No jaundice. Psych:  Alert and cooperative. Normal mood and affect.  Imaging Studies: Reviewed  Assessment and Plan:   Sharon [name] is a 66 y.o. female with history of small bowel Crohn's with obstruction, s/p ileocecal resection in 2000, recurrent obstruction, status post second resection in 2016, previously treated with Cimzia, Remicade, Imuran and most recently Entyvio.  Patient self discontinued Entyvio in June 2023 as she is concerned about skin lesions.  Small bowel Crohn's with stricturing phenotype Check fecal calprotectin levels Recommend colonoscopy for assessment of Crohn's disease Discussed with patient regarding referral to dermatology to evaluate skin lesions before reinitiating Entyvio and patient is agreeable with the plan   Follow up in 3 to 4 months   Cephas Darby, MD

## 2022-08-19 ENCOUNTER — Encounter: Payer: Self-pay | Admitting: Family Medicine

## 2022-08-19 ENCOUNTER — Other Ambulatory Visit: Payer: Self-pay

## 2022-08-19 MED ORDER — METOPROLOL TARTRATE 50 MG PO TABS
50.0000 mg | ORAL_TABLET | Freq: Two times a day (BID) | ORAL | 2 refills | Status: DC
  Filled 2022-08-19: qty 60, 30d supply, fill #0
  Filled 2022-10-20: qty 60, 30d supply, fill #1
  Filled 2022-11-20: qty 60, 30d supply, fill #2

## 2022-08-25 ENCOUNTER — Telehealth: Payer: Self-pay

## 2022-08-25 NOTE — Telephone Encounter (Signed)
Patient called wanting to reschedule her colonoscopy and EGD. Requesting call back.

## 2022-08-25 NOTE — Telephone Encounter (Signed)
Colonoscopy w/EGD has been rescheduled with Dr. Marius Ditch for 09/16/22.  Trish in Endo has been notified of date change.  Referral updated.  Thanks,  [place], [place]

## 2022-08-25 NOTE — Telephone Encounter (Signed)
Returned patients phone call to reschedule her colonoscopy with EGD with Dr. Marius Ditch currently scheduled for 08/31/22.  She has requested to rescheduled to 09/02/22 (Friday).  This date is currently full.  LVM asking her to call me back to schedule for another date.  Thanks,  [place], [place]

## 2022-08-30 ENCOUNTER — Telehealth: Payer: Self-pay

## 2022-08-30 NOTE — Telephone Encounter (Signed)
We received FMLA forms for patient. Can we fill this out

## 2022-09-02 LAB — CALPROTECTIN, FECAL: Calprotectin, Fecal: 108 ug/g (ref 0–120)

## 2022-09-05 NOTE — Telephone Encounter (Signed)
Patient verbalized understanding of recommendations. 

## 2022-09-05 NOTE — Telephone Encounter (Signed)
Sharon [name]  Please inform patient that I would like to hold off on filling out the FMLA forms until we do the colonoscopy.  This will help [place] to assess if she has active Crohn's disease  Thanks  Cleotilde Spadaccini

## 2022-09-05 NOTE — Telephone Encounter (Signed)
Called and left a message for call back  

## 2022-09-16 ENCOUNTER — Telehealth: Payer: Self-pay

## 2022-09-16 NOTE — Telephone Encounter (Signed)
Trish called to see if we knew where patient wanted to reschedule her colonoscopy and EGD to. Informed her that I did not know because I have no documentation she called [place] to cancel today. Called patient and left a message for call back to see if she wanted to reschedule her procedures.

## 2022-09-16 NOTE — Telephone Encounter (Signed)
Returned patient call she states she called someone on Monday on the mail line at [phone] and talk to someone but does not remember her name and she said she would take message send it to the nurse to reschedule. She states then when endo called her yesterday she informed them that she had already cancelled the procedure. I told her I was very sorry and I never got the message. Asked her if I could get her reschedule and she states yes and we agreed on 10/10/22 and she is going to check her work schedule tomorrow and make sure it works with her work schedule. She will call me back on Monday if it does not work. Gave patient my direct phone number to reschedule. Called ENDO and put patient on 10/10/2022 and talk to trish.

## 2022-09-16 NOTE — Telephone Encounter (Signed)
Patient called highly upset and stated that she called our number twice this week to cancel her colonoscopy. She stated that she spoke to somebody here and they had said they were putting a note in her chart to cancel the colonoscopy but there was no note. Patient is highly upset about this because she does not want to be charged the no show fee. I told her I would inform the office manager of the situation.

## 2022-09-22 ENCOUNTER — Ambulatory Visit: Payer: Commercial Managed Care - PPO | Admitting: Physician Assistant

## 2022-10-09 ENCOUNTER — Other Ambulatory Visit: Payer: Self-pay

## 2022-10-10 ENCOUNTER — Encounter
Admission: RE | Disposition: A | Discharge status: Home / Self Care | Payer: Self-pay | Source: Home / Self Care | Attending: Gastroenterology

## 2022-10-10 ENCOUNTER — Ambulatory Visit
Admission: RE | Admit: 2022-10-10 | Discharge: 2022-10-10 | Disposition: A | Discharge status: Home / Self Care | Payer: Commercial Managed Care - PPO | Attending: Gastroenterology | Admitting: Gastroenterology

## 2022-10-10 ENCOUNTER — Ambulatory Visit: Payer: Commercial Managed Care - PPO | Admitting: Registered Nurse

## 2022-10-10 DIAGNOSIS — K633 Ulcer of intestine: Secondary | ICD-10-CM | POA: Insufficient documentation

## 2022-10-10 DIAGNOSIS — K760 Fatty (change of) liver, not elsewhere classified: Secondary | ICD-10-CM | POA: Insufficient documentation

## 2022-10-10 DIAGNOSIS — Z87891 Personal history of nicotine dependence: Secondary | ICD-10-CM | POA: Diagnosis not present

## 2022-10-10 DIAGNOSIS — K219 Gastro-esophageal reflux disease without esophagitis: Secondary | ICD-10-CM | POA: Insufficient documentation

## 2022-10-10 DIAGNOSIS — K644 Residual hemorrhoidal skin tags: Secondary | ICD-10-CM | POA: Insufficient documentation

## 2022-10-10 DIAGNOSIS — F419 Anxiety disorder, unspecified: Secondary | ICD-10-CM | POA: Diagnosis not present

## 2022-10-10 DIAGNOSIS — K319 Disease of stomach and duodenum, unspecified: Secondary | ICD-10-CM

## 2022-10-10 DIAGNOSIS — E669 Obesity, unspecified: Secondary | ICD-10-CM | POA: Diagnosis not present

## 2022-10-10 DIAGNOSIS — I10 Essential (primary) hypertension: Secondary | ICD-10-CM | POA: Insufficient documentation

## 2022-10-10 DIAGNOSIS — I4891 Unspecified atrial fibrillation: Secondary | ICD-10-CM | POA: Diagnosis not present

## 2022-10-10 DIAGNOSIS — D510 Vitamin B12 deficiency anemia due to intrinsic factor deficiency: Secondary | ICD-10-CM | POA: Diagnosis not present

## 2022-10-10 DIAGNOSIS — K649 Unspecified hemorrhoids: Secondary | ICD-10-CM | POA: Diagnosis not present

## 2022-10-10 DIAGNOSIS — Z980 Intestinal bypass and anastomosis status: Secondary | ICD-10-CM | POA: Insufficient documentation

## 2022-10-10 DIAGNOSIS — K5080 Crohn's disease of both small and large intestine without complications: Secondary | ICD-10-CM

## 2022-10-10 DIAGNOSIS — M1990 Unspecified osteoarthritis, unspecified site: Secondary | ICD-10-CM | POA: Diagnosis not present

## 2022-10-10 DIAGNOSIS — Z6831 Body mass index (BMI) 31.0-31.9, adult: Secondary | ICD-10-CM | POA: Insufficient documentation

## 2022-10-10 DIAGNOSIS — K5090 Crohn's disease, unspecified, without complications: Secondary | ICD-10-CM | POA: Diagnosis not present

## 2022-10-10 DIAGNOSIS — K648 Other hemorrhoids: Secondary | ICD-10-CM | POA: Insufficient documentation

## 2022-10-10 DIAGNOSIS — K2950 Unspecified chronic gastritis without bleeding: Secondary | ICD-10-CM | POA: Diagnosis not present

## 2022-10-10 DIAGNOSIS — K5000 Crohn's disease of small intestine without complications: Secondary | ICD-10-CM | POA: Insufficient documentation

## 2022-10-10 DIAGNOSIS — K3189 Other diseases of stomach and duodenum: Secondary | ICD-10-CM | POA: Diagnosis not present

## 2022-10-10 HISTORY — PX: COLONOSCOPY WITH PROPOFOL: SHX5780

## 2022-10-10 HISTORY — PX: ESOPHAGOGASTRODUODENOSCOPY (EGD) WITH PROPOFOL: SHX5813

## 2022-10-10 SURGERY — COLONOSCOPY WITH PROPOFOL
Anesthesia: General

## 2022-10-10 MED ORDER — LIDOCAINE HCL (CARDIAC) PF 100 MG/5ML IV SOSY
PREFILLED_SYRINGE | INTRAVENOUS | Status: DC | PRN
  Administered 2022-10-10: 100 mg via INTRAVENOUS

## 2022-10-10 MED ORDER — SODIUM CHLORIDE 0.9 % IV SOLN
INTRAVENOUS | Status: DC
  Administered 2022-10-10: 20 mL/h via INTRAVENOUS

## 2022-10-10 MED ORDER — PHENYLEPHRINE HCL (PRESSORS) 10 MG/ML IV SOLN
INTRAVENOUS | Status: DC | PRN
  Administered 2022-10-10: 160 ug via INTRAVENOUS
  Administered 2022-10-10: 320 ug via INTRAVENOUS
  Administered 2022-10-10: 160 ug via INTRAVENOUS

## 2022-10-10 MED ORDER — PROPOFOL 500 MG/50ML IV EMUL
INTRAVENOUS | Status: DC | PRN
  Administered 2022-10-10: 179.134 ug/kg/min via INTRAVENOUS

## 2022-10-10 MED ORDER — PROPOFOL 10 MG/ML IV BOLUS
INTRAVENOUS | Status: DC | PRN
  Administered 2022-10-10: 70 mg via INTRAVENOUS

## 2022-10-10 MED ORDER — PROPOFOL 1000 MG/100ML IV EMUL
INTRAVENOUS | Status: AC
  Filled 2022-10-10: qty 100

## 2022-10-10 MED ORDER — EPHEDRINE SULFATE (PRESSORS) 50 MG/ML IJ SOLN
INTRAMUSCULAR | Status: DC | PRN
  Administered 2022-10-10: 10 mg via INTRAVENOUS
  Administered 2022-10-10: 15 mg via INTRAVENOUS
  Administered 2022-10-10: 10 mg via INTRAVENOUS

## 2022-10-10 MED ORDER — GLYCOPYRROLATE 0.2 MG/ML IJ SOLN
INTRAMUSCULAR | Status: AC
  Filled 2022-10-10: qty 1

## 2022-10-10 MED ORDER — GLYCOPYRROLATE 0.2 MG/ML IJ SOLN
INTRAMUSCULAR | Status: DC | PRN
  Administered 2022-10-10: .2 mg via INTRAVENOUS

## 2022-10-10 NOTE — Anesthesia Preprocedure Evaluation (Signed)
Anesthesia Evaluation  Patient identified by MRN, date of birth, ID band Patient awake    Reviewed: Allergy & Precautions, H&P , NPO status , Patient's Chart, lab work & pertinent test results  Airway Mallampati: III  TM Distance: >3 FB Neck ROM: full    Dental  (+) Poor Dentition, Chipped   Pulmonary neg pulmonary ROS, former smoker   Pulmonary exam normal        Cardiovascular hypertension, + dysrhythmias Atrial Fibrillation  Rhythm:Regular Rate:Abnormal     Neuro/Psych  PSYCHIATRIC DISORDERS      TIA   GI/Hepatic Neg liver ROS,,,Crohn's disease   Endo/Other  negative endocrine ROS    Renal/GU negative Renal ROS  negative genitourinary   Musculoskeletal  (+) Arthritis ,    Abdominal  (+) + obese  Peds  Hematology negative hematology ROS (+)   Anesthesia Other Findings Past Medical History: No date: Anxiety and depression No date: Arthritis No date: B12 deficiency     Comment:  pernicious anemia - monthly b12 shots 12/2008: Cervical cancer ([place])     Comment:  s/p hysterectomy 2000: Crohn's disease ([place])     Comment:  h/o stenotic crohn's ileitis, active in TI s/p               resections 2000, 2016 PPD neg ([facility] Dr. [name]); has              been on cimzia, remicade, 6MP, entocort, now stable on               Entyvio ([facility] at [place]) No date: Genital warts No date: GERD (gastroesophageal reflux disease)     Comment:  severe, daily sxs if off PPI 04/2011: Hepatic steatosis     Comment:  diffuse on CT, 32m gallbladder polyp No date: History of anemia     Comment:  attributed to crohn's No date: History of chicken pox 1980s: History of syphilis No date: HTN (hypertension) No date: Migraines     Comment:  and frequent other headaches (sinus or stress) No date: Scalp psoriasis 12/2012: Sensorineural hearing loss of both ears     Comment:  high freq 07/2012: Takotsubo syndrome     Comment:  cath  08/01/12, normal coronaries, LVEF 65% 05/2010: TIA (transient ischemic attack)     [facility] (w/u negative -               carotids, echo, TC doppler, and MRI normal) No date: Tobacco abuse  Past Surgical History: 1998: APPENDECTOMY 07/2012: CARDIAC CATHETERIZATION     Comment:  normal LV fxn, widely patent coronaries 10/2008: CERVICAL BIOPSY  W/ LOOP ELECTRODE EXCISION 10/2014: COLON RESECTION     Comment:  removal of scar tissue colon from [facility]     Comment:  ileo-colonic anastomosis, ielocolonic crohn's 05/2011: COLONOSCOPY     Comment:  ileo-colonic anastomosis normal, normal mucosa 10/15/2012: COLONOSCOPY     Comment:  focal inflammation at anastomosis, no active crohn's,               planning MR enterograph ([name] 09/2014: COLONOSCOPY     Comment:  [facility] 05/2011: ESOPHAGOGASTRODUODENOSCOPY     Comment:  nl esophagus, gastritis, nl duodenum No date: KNEE SURGERY; Left 04/15/2020: LEFT HEART CATH AND CORONARY ANGIOGRAPHY; Left     Comment:  Procedure: LEFT HEART CATH AND CORONARY  ANGIOGRAPHY;                Surgeon: Yolonda Kida, MD;  Location: [place]              CV LAB;  Service: Cardiovascular;  Laterality: Left; 08/01/2012: LEFT HEART CATHETERIZATION WITH CORONARY ANGIOGRAM; N/A     Comment:  Procedure: LEFT HEART CATHETERIZATION WITH CORONARY               ANGIOGRAM;  Surgeon: Sherren Mocha, MD;  Location: [facility]               CATH LAB;  Service: Cardiovascular;  Laterality: N/A; 2000: RIGHT COLECTOMY     Comment:  crohn's disease, ileocecal resection 05/31/2012: SHOULDER ARTHROSCOPY W/ ROTATOR CUFF REPAIR; Right     Comment:  [facility] Tamera Punt) 05/2010: US ECHOCARDIOGRAPHY     Comment:  nl LV fxn ,EF 60% 12/2008: VAGINAL HYSTERECTOMY     Comment:  LAVH/BSO  BMI    Body Mass Index: 31.74 kg/m      Reproductive/Obstetrics negative OB ROS                              Anesthesia Physical Anesthesia Plan  ASA: 3  Anesthesia Plan: General   Post-op Pain Management:    Induction:   PONV Risk Score and Plan: Propofol infusion and TIVA  Airway Management Planned: Natural Airway  Additional Equipment:   Intra-op Plan:   Post-operative Plan:   Informed Consent: I have reviewed the patients History and Physical, chart, labs and discussed the procedure including the risks, benefits and alternatives for the proposed anesthesia with the patient or authorized representative who has indicated his/her understanding and acceptance.     Dental Advisory Given  Plan Discussed with: CRNA and Surgeon  Anesthesia Plan Comments:        Anesthesia Quick Evaluation

## 2022-10-10 NOTE — Anesthesia Postprocedure Evaluation (Signed)
Anesthesia Post Note  Patient: Sharon [name]  Procedure(s) Performed: COLONOSCOPY WITH PROPOFOL ESOPHAGOGASTRODUODENOSCOPY (EGD) WITH PROPOFOL  Patient location during evaluation: PACU Anesthesia Type: General Level of consciousness: awake and alert Pain management: pain level controlled Vital Signs Assessment: post-procedure vital signs reviewed and stable Respiratory status: spontaneous breathing, nonlabored ventilation and respiratory function stable Cardiovascular status: blood pressure returned to baseline and stable Postop Assessment: no apparent nausea or vomiting Anesthetic complications: no   No notable events documented.   Last Vitals:  Vitals:   10/10/22 1018 10/10/22 1028  BP: 110/87 (!) 109/56  Pulse: 62 60  Resp: 12 14  Temp:    SpO2: 99% 99%    Last Pain:  Vitals:   10/10/22 1028  TempSrc:   PainSc: 3                  [place] Ouch

## 2022-10-10 NOTE — Op Note (Signed)
[facility] Gastroenterology Patient Name: Sharon [name] Procedure Date: 10/10/2022 9:05 AM MRN: [id number] Account #: [id number] Date of Birth: 09-22-1955 Admit Type: Outpatient Age: 67 Room: [facility] ENDO ROOM 4 Gender: Female Note Status: Finalized Instrument Name: Peds Colonoscope [id number] Procedure:             Colonoscopy Indications:           Follow-up of Crohn's disease of the small bowel,                         Disease activity assessment of Crohn's disease of the                         small bowel Providers:             Lin Landsman MD, MD Referring MD:          Haydee Salter (Referring MD) Medicines:             General Anesthesia Complications:         No immediate complications. Estimated blood loss: None. Procedure:             Pre-Anesthesia Assessment:                        - Prior to the procedure, a History and Physical was                         performed, and patient medications and allergies were                         reviewed. The patient is competent. The risks and                         benefits of the procedure and the sedation options and                         risks were discussed with the patient. All questions                         were answered and informed consent was obtained.                         Patient identification and proposed procedure were                         verified by the physician, the nurse, the                         anesthesiologist, the anesthetist and the technician                         in the pre-procedure area in the procedure room in the                         endoscopy suite. Mental Status Examination: alert and                         oriented. Airway Examination: normal oropharyngeal  airway and neck mobility. Respiratory Examination:                         clear to auscultation. CV Examination: normal.                         Prophylactic Antibiotics: The  patient does not require                         prophylactic antibiotics. Prior Anticoagulants: The                         patient has taken no anticoagulant or antiplatelet                         agents. ASA Grade Assessment: III - A patient with                         severe systemic disease. After reviewing the risks and                         benefits, the patient was deemed in satisfactory                         condition to undergo the procedure. The anesthesia                         plan was to use general anesthesia. Immediately prior                         to administration of medications, the patient was                         re-assessed for adequacy to receive sedatives. The                         heart rate, respiratory rate, oxygen saturations,                         blood pressure, adequacy of pulmonary ventilation, and                         response to care were monitored throughout the                         procedure. The physical status of the patient was                         re-assessed after the procedure.                        After obtaining informed consent, the colonoscope was                         passed under direct vision. Throughout the procedure,                         the patient's blood pressure, pulse, and oxygen  saturations were monitored continuously. The                         Colonoscope was introduced through the anus and                         advanced to the the ileocolonic anastomosis. The                         colonoscopy was performed without difficulty. The                         patient tolerated the procedure well. The quality of                         the bowel preparation was evaluated using the BBPS                         [facility] Bowel Preparation Scale) with scores of: Right                         Colon = 3, Transverse Colon = 3 and Left Colon = 3                         (entire mucosa  seen well with no residual staining,                         small fragments of stool or opaque liquid). The total                         BBPS score equals 9. Ileocolonic anastamosis were                         photographed. Findings:      Hemorrhoids were found on perianal exam.      There was evidence of a prior end-to-side ileo-colonic anastomosis in       the cecum. This was characterized by few superficial small ulcerations.       The anastomosis with neo-terminal ileum could not be identified despite       thorough look. Biopsies of the anastamosis were taken with a cold       forceps for histology.      Normal mucosa was found in the left colon and in the right colon.       Biopsies were taken with a cold forceps for histology.      Non-bleeding external and internal hemorrhoids were found during       retroflexion. The hemorrhoids were large. Impression:            - Hemorrhoids found on perianal exam.                        - Patent end-to-side ileo-colonic anastomosis,                         characterized by ulceration. Biopsied.                        - Normal mucosa in the left colon and in the  right                         colon. Biopsied.                        - Non-bleeding external and internal hemorrhoids. Recommendation:        - Discharge patient to home (with escort).                        - Resume previous diet today.                        - Continue present medications.                        - Await pathology results.                        - Perform magnetic resonance imaging (MRI) with                         gadolinium at appointment to be scheduled. Procedure Code(s):     --- Professional ---                        [id number], Colonoscopy, flexible; with biopsy, single or                         multiple Diagnosis Code(s):     --- Professional ---                        Z98.0, Intestinal bypass and anastomosis status                        K64.8, Other  hemorrhoids                        K50.00, Crohn's disease of small intestine without                         complications CPT copyright 2022 American Medical Association. All rights reserved. The codes documented in this report are preliminary and upon coder review may  be revised to meet current compliance requirements. Dr. Ulyess Mort Lin Landsman MD, MD 10/10/2022 10:15:22 AM This report has been signed electronically. Number of Addenda: 0 Note Initiated On: 10/10/2022 9:05 AM Scope Withdrawal Time: 0 hours 21 minutes 37 seconds  Total Procedure Duration: 0 hours 27 minutes 53 seconds  Estimated Blood Loss:  Estimated blood loss: none.      [facility]

## 2022-10-10 NOTE — H&P (Signed)
Cephas Darby, MD [address]  [place]  [place], [place] [address]  Main: [phone]  Fax: [phone] Pager: [phone]  Primary Care Physician:  Haydee Salter, MD Primary Gastroenterologist:  Dr. Cephas Darby  Pre-Procedure History & Physical: HPI:  Sharon [name] is a 67 y.o. female is here for an endoscopy and colonoscopy.   Past Medical History:  Diagnosis Date   Anxiety and depression    Arthritis    B12 deficiency    pernicious anemia - monthly b12 shots   Cervical cancer ([place]) 12/2008   s/p hysterectomy   Crohn's disease ([place]) 2000   h/o stenotic crohn's ileitis, active in TI s/p resections 2000, 2016 PPD neg ([facility] Dr. [name]); has been on cimzia, remicade, 6MP, entocort, now stable on Entyvio ([place] at [facility])   Genital warts    GERD (gastroesophageal reflux disease)    severe, daily sxs if off PPI   Hepatic steatosis 04/2011   diffuse on CT, 2m gallbladder polyp   History of anemia    attributed to crohn's   History of chicken pox    History of syphilis 1980s   HTN (hypertension)    Migraines    and frequent other headaches (sinus or stress)   Scalp psoriasis    Sensorineural hearing loss of both ears 12/2012   high freq   Takotsubo syndrome 07/2012   cath 08/01/12, normal coronaries, LVEF 65%   TIA (transient ischemic attack) [facility]/2010   [facility] vs complex migraine (w/u negative - carotids, echo, TC doppler, and MRI normal)   Tobacco abuse     Past Surgical History:  Procedure Laterality Date   [place] 07[place]   normal LV fxn, widely patent coronaries   CERVICAL BIOPSY  W/ LOOP ELECTRODE EXCISION  10/2008   COLON RESECTION  10/2014   removal of scar tissue colon from prior surgery ([place] [facility]   COLONOSCOPY  10/2008   ileo-colonic anastomosis, ielocolonic crohn's   COLONOSCOPY  05/2011   ileo-colonic anastomosis normal, normal mucosa   COLONOSCOPY  10/15/2012   focal  inflammation at anastomosis, no active crohn's, planning MR enterograph ([name]   COLONOSCOPY  09/2014   [place]   ESOPHAGOGASTRODUODENOSCOPY  05/2011   nl esophagus, gastritis, nl duodenum   KNEE SURGERY Left    LEFT HEART CATH AND CORONARY ANGIOGRAPHY Left 04/15/2020   Procedure: LEFT HEART CATH AND CORONARY ANGIOGRAPHY;  Surgeon: CYolonda Kida MD;  [facility];  Service: Cardiovascular;  Laterality: Left;   LEFT HEART CATHETERIZATION WITH CORONARY ANGIOGRAM N/A 08/01/2012   Procedure: LEFT HEART CATHETERIZATION WITH CORONARY ANGIOGRAM;  Surgeon: MSherren Mocha MD;  [facility];  Service: Cardiovascular;  Laterality: N/A;   RIGHT COLECTOMY  2000   crohn's disease, ileocecal resection   SHOULDER ARTHROSCOPY W/ ROTATOR CUFF REPAIR Right 05/31/2012   [facility] (Tamera Punt   [place]  05[place]   nl LV fxn ,EF 60%   VAGINAL HYSTERECTOMY  12/2008   [facility]/BSO    Prior to Admission medications   Medication Sig Start Date End Date Taking? Authorizing Provider  acetaminophen (TYLENOL) 500 MG tablet Take 1,000-1,500 mg by mouth 2 (two) times daily as needed for moderate pain or headache.   Yes [provider]  aspirin EC 81 MG tablet Take 1 tablet (81 mg total) by mouth daily. Over the counter 05/01/16  Yes Rai, RVernelle Emerald MD  Carboxymethylcellul-Glycerin (LUBRICATING  EYE DROPS OP) Place 1 drop into both eyes daily as needed (dry eyes).   Yes [provider]  diphenhydrAMINE (BENADRYL) 25 mg capsule Take 50 mg by mouth every 8 (eight) hours as needed for allergies.    Yes [provider]  escitalopram (LEXAPRO) 20 MG tablet Take 1 tablet (20 mg total) by mouth daily. 08/15/22  Yes Haydee Salter, MD  Fremanezumab-vfrm (AJOVY) 225 MG/1.5ML SOAJ Inject 226m subcutaneously monthly 11/18/21  Yes   Melatonin 10 MG CAPS Take 10 mg by mouth at bedtime.   Yes [provider]  metoprolol tartrate (LOPRESSOR) 50 MG  tablet Take 1 tablet (50 mg total) by mouth 2 (two) times daily for 30 days 08/19/22  Yes RHaydee Salter MD  nortriptyline (PAMELOR) 25 MG capsule TAKE 1 CAPSULE BY MOUTH NIGHTLY WITH 75MG CAPSULE TO EQUAL 100MG NIGHTLY 08/11/22  Yes RHaydee Salter MD  nortriptyline (PAMELOR) 75 MG capsule Take 1 capsule (75 mg total) by mouth at bedtime Take with 262mtablet to equal 10018mightly 08/11/22  Yes RudHaydee SalterD  omeprazole (PRILOSEC) 20 MG capsule Take 20 mg by mouth daily.   Yes [provider]  rosuvastatin (CRESTOR) 5 MG tablet Take 1 tablet (5 mg total) by mouth at bedtime. 07/19/22  Yes RudHaydee SalterD  traMADol (ULTRAM) 50 MG tablet Take 50 mg by mouth every 6 (six) hours as needed. 06/09/22  Yes [provider]  vedolizumab (ENTYVIO) 300 MG injection Inject into the vein every 8 (eight) weeks.   Yes [provider]  vitamin B-12 (CYANOCOBALAMIN) 500 MCG tablet Take 500 mcg by mouth daily.   Yes [provider]    Allergies as of 08/19/2022 - Review Complete 07/19/2022  Allergen Reaction Noted   Infliximab Dermatitis 12/10/2015   Codeine Nausea And Vomiting 05/16/2011   Flagyl [metronidazole] Nausea And Vomiting 07/15/2014   Hydrocodone Nausea And Vomiting 11/13/2012   Ibuprofen Other (See Comments) 05/23/2012   Remeron [mirtazapine] Other (See Comments) 10/13/2011    Family History  Problem Relation Age of Onset   Stroke Mother    Hypertension Mother    Hyperlipidemia Mother    Hypertension Father    Crohn's disease Father    Cancer Sister        ovarian   Crohn's disease Sister    Crohn's disease Daughter    Ulcerative colitis Daughter    Cancer Paternal Aunt        Brain   Crohn's disease Paternal Aunt    Crohn's disease Paternal Uncle    Stroke Paternal Grandmother    Diabetes Neg Hx     Social History   Socioeconomic History   Marital status: Divorced    Spouse name: Not on file   Number of children: 3   Years of  education: Not on file   Highest education level: Bachelor's degree (e.g., BA, AB, BS)  Occupational History   Occupation: Nursing Tech    Comment: [employer]  Tobacco Use   Smoking status: Former    Packs/day: 0.33    Years: 10.00    Total pack years: 3.30    Types: Cigarettes    Quit date: 11/14/2016    Years since quitting: 5.9   Smokeless tobacco: Never   Tobacco comments:    Quit within past yr  Vaping Use   Vaping Use: Never used  Substance and Sexual Activity   Alcohol use: Yes    Comment: occ   Drug  use: No    Comment: extensive drug use, remotely   Sexual activity: Not Currently    Birth control/protection: Surgical, Post-menopausal  Other Topics Concern   Not on file  Social History Narrative   Not on file   Social Determinants of Health   Financial Resource Strain: Not on file  Food Insecurity: Not on file  Transportation Needs: Not on file  Physical Activity: Unknown (02/12/2018)   Exercise Vital Sign    Days of Exercise per Week: 5 days    Minutes of Exercise per Session: Not on file  Stress: No Stress Concern Present (02/12/2018)   [place]    Feeling of Stress : Only a little  Social Connections: Not on file  Intimate Partner Violence: Not on file    Review of Systems: See HPI, otherwise negative ROS  Physical Exam: BP (!) 146/70   Pulse (!) 53   Temp (!) 97.4 F (36.3 C) (Temporal)   Resp 20   Ht 5' 1"$  (1.549 m)   Wt 76.2 kg   SpO2 99%   BMI 31.74 kg/m  General:   Alert,  pleasant and cooperative in NAD Head:  Normocephalic and atraumatic. Neck:  Supple; no masses or thyromegaly. Lungs:  Clear throughout to auscultation.    Heart:  Regular rate and rhythm. Abdomen:  Soft, nontender and nondistended. Normal bowel sounds, without guarding, and without rebound.   Neurologic:  Alert and  oriented x4;  grossly normal neurologically.  Impression/Plan: Sharon [name] is here for  an endoscopy and colonoscopy to be performed for h/o crohn's disease  Risks, benefits, limitations, and alternatives regarding  endoscopy and colonoscopy have been reviewed with the patient.  Questions have been answered.  All parties agreeable.   Sherri Sear, MD  10/10/2022, 9:05 AM

## 2022-10-10 NOTE — Op Note (Signed)
[facility] Gastroenterology Patient Name: Sharon [name] Procedure Date: 10/10/2022 9:21 AM MRN: [id number] Account #: [id number] Date of Birth: 12-26-1955 Admit Type: Outpatient Age: 67 Room: [facility] ENDO ROOM 4 Gender: Female Note Status: Finalized Instrument Name: Upper Endoscope [id number] Procedure:             Upper GI endoscopy Indications:           Crohn's disease Providers:             Lin Landsman MD, MD Referring MD:          Haydee Salter (Referring MD) Medicines:             General Anesthesia Complications:         No immediate complications. Estimated blood loss: None. Procedure:             Pre-Anesthesia Assessment:                        - Prior to the procedure, a History and Physical was                         performed, and patient medications and allergies were                         reviewed. The patient is competent. The risks and                         benefits of the procedure and the sedation options and                         risks were discussed with the patient. All questions                         were answered and informed consent was obtained.                         Patient identification and proposed procedure were                         verified by the physician, the nurse, the                         anesthesiologist, the anesthetist and the technician                         in the pre-procedure area in the procedure room in the                         endoscopy suite. Mental Status Examination: alert and                         oriented. Airway Examination: normal oropharyngeal                         airway and neck mobility. Respiratory Examination:                         clear to auscultation. CV Examination: normal.  Prophylactic Antibiotics: The patient does not require                         prophylactic antibiotics. Prior Anticoagulants: The                         patient has taken  no anticoagulant or antiplatelet                         agents. ASA Grade Assessment: III - A patient with                         severe systemic disease. After reviewing the risks and                         benefits, the patient was deemed in satisfactory                         condition to undergo the procedure. The anesthesia                         plan was to use general anesthesia. Immediately prior                         to administration of medications, the patient was                         re-assessed for adequacy to receive sedatives. The                         heart rate, respiratory rate, oxygen saturations,                         blood pressure, adequacy of pulmonary ventilation, and                         response to care were monitored throughout the                         procedure. The physical status of the patient was                         re-assessed after the procedure.                        After obtaining informed consent, the endoscope was                         passed under direct vision. Throughout the procedure,                         the patient's blood pressure, pulse, and oxygen                         saturations were monitored continuously. The Endoscope                         was introduced through the mouth, and advanced to the  second part of duodenum. The upper GI endoscopy was                         accomplished without difficulty. The patient tolerated                         the procedure well. Findings:      The duodenal bulb and second portion of the duodenum were normal.       Biopsies were taken with a cold forceps for histology.      Striped moderately erythematous mucosa without bleeding was found in the       gastric antrum and in the prepyloric region of the stomach. Biopsies       were taken with a cold forceps for histology.      The gastric body and incisura were normal. Biopsies were taken with a        cold forceps for histology.      The cardia and gastric fundus were normal on retroflexion.      Esophagogastric landmarks were identified: the gastroesophageal junction       was found at 38 cm from the incisors.      The gastroesophageal junction and examined esophagus were normal. Impression:            - Normal duodenal bulb and second portion of the                         duodenum. Biopsied.                        - Erythematous mucosa in the antrum and prepyloric                         region of the stomach. Biopsied.                        - Normal gastric body and incisura. Biopsied.                        - Esophagogastric landmarks identified.                        - Normal gastroesophageal junction and esophagus. Recommendation:        - Await pathology results.                        - Proceed with colonoscopy as scheduled                        See colonoscopy report Procedure Code(s):     --- Professional ---                        [phone], Esophagogastroduodenoscopy, flexible,                         transoral; with biopsy, single or multiple Diagnosis Code(s):     --- Professional ---                        K31.89, Other diseases of stomach and duodenum  K50.90, Crohn's disease, unspecified, without                         complications CPT copyright 2022 American Medical Association. All rights reserved. The codes documented in this report are preliminary and upon coder review may  be revised to meet current compliance requirements. Dr. Ulyess Mort Lin Landsman MD, MD 10/10/2022 9:34:37 AM This report has been signed electronically. Number of Addenda: 0 Note Initiated On: 10/10/2022 9:21 AM Estimated Blood Loss:  Estimated blood loss: none.      [facility]

## 2022-10-10 NOTE — Transfer of Care (Signed)
Immediate Anesthesia Transfer of Care Note  Patient: Sharon [name]  Procedure(s) Performed: COLONOSCOPY WITH PROPOFOL ESOPHAGOGASTRODUODENOSCOPY (EGD) WITH PROPOFOL  Patient Location: Endoscopy Unit  Anesthesia Type:General  Level of Consciousness: drowsy  Airway & Oxygen Therapy: Patient Spontanous Breathing  Post-op Assessment: Report given to RN and Post -op Vital signs reviewed and stable  Post vital signs: Reviewed and stable  Last Vitals:  Vitals Value Taken Time  BP 85/37 10/10/22 1010  Temp 36.3 C 10/10/22 1008  Pulse 58 10/10/22 1011  Resp 15 10/10/22 1011  SpO2 97 % 10/10/22 1011  Vitals shown include unvalidated device data.  Last Pain:  Vitals:   10/10/22 1008  TempSrc: Tympanic  PainSc: Asleep         Complications: No notable events documented.

## 2022-10-11 ENCOUNTER — Encounter: Payer: Self-pay | Admitting: Gastroenterology

## 2022-10-11 NOTE — Telephone Encounter (Signed)
This was coded for Crohns disease not screening colonoscopy

## 2022-10-12 LAB — SURGICAL PATHOLOGY

## 2022-10-14 ENCOUNTER — Other Ambulatory Visit (HOSPITAL_COMMUNITY): Payer: Self-pay

## 2022-10-14 ENCOUNTER — Other Ambulatory Visit: Payer: Self-pay

## 2022-10-16 ENCOUNTER — Encounter: Payer: Self-pay | Admitting: Gastroenterology

## 2022-10-17 ENCOUNTER — Telehealth: Payer: Self-pay

## 2022-10-17 DIAGNOSIS — K5080 Crohn's disease of both small and large intestine without complications: Secondary | ICD-10-CM

## 2022-10-17 NOTE — Telephone Encounter (Signed)
Patient verbalized understanding of results and she is okay with having done MR Enterography. Called central scheduling for and got patient schedule for 10/24/2022 arrive at 7:30am for a 8:00am. Nothing to eat or drink after midnight. Informed patient of the instructions she verbalized understanding

## 2022-10-17 NOTE — Telephone Encounter (Signed)
-----   Message from Lin Landsman, MD sent at 10/16/2022 10:44 PM EST ----- Please inform patient that the pathology results from colonoscopy shows moderately active chronic inflammation at the anastomotic site that is the site of previous surgery when she had Crohn's disease.  Recommend MR enterography to evaluate small bowel before recommending any further treatment of her Crohn's disease  Rohini Vanga

## 2022-10-17 NOTE — Telephone Encounter (Signed)
Called and left a message for call back  

## 2022-10-19 ENCOUNTER — Ambulatory Visit: Payer: Commercial Managed Care - PPO | Admitting: Family Medicine

## 2022-10-19 ENCOUNTER — Telehealth: Payer: Self-pay | Admitting: Family Medicine

## 2022-10-19 NOTE — Telephone Encounter (Signed)
2.21.24 no show letter sent

## 2022-10-24 ENCOUNTER — Ambulatory Visit
Admission: RE | Admit: 2022-10-24 | Discharge: 2022-10-24 | Disposition: A | Discharge status: Home / Self Care | Payer: Commercial Managed Care - PPO | Source: Ambulatory Visit | Attending: Gastroenterology | Admitting: Gastroenterology

## 2022-10-24 DIAGNOSIS — K5080 Crohn's disease of both small and large intestine without complications: Secondary | ICD-10-CM

## 2022-10-24 DIAGNOSIS — K5090 Crohn's disease, unspecified, without complications: Secondary | ICD-10-CM | POA: Diagnosis not present

## 2022-10-24 MED ORDER — GADOBUTROL 1 MMOL/ML IV SOLN
7.0000 mL | Freq: Once | INTRAVENOUS | Status: AC | PRN
  Administered 2022-10-24: 7 mL via INTRAVENOUS

## 2022-10-25 ENCOUNTER — Encounter: Payer: Self-pay | Admitting: Gastroenterology

## 2022-10-25 NOTE — Progress Notes (Deleted)
Sharon [name],acting as a Education administrator for [employer], PA-C.,have documented all relevant documentation on the behalf of Sharon Speak, PA-C,as directed by  [employer], PA-C while in the presence of [employer], PA-C.   New patient visit   Patient: Sharon [name]   DOB: [birth year]   67 y.o. Female  MRN: [id number] Visit Date: 10/26/2022  Today's healthcare provider: Mardene Speak, PA-C   No chief complaint on file.  Subjective    Sharon [name] is a 67 y.o. female who presents today as a new patient to establish care.  HPI  Insomnia Patient is a 67 year old new patient to the practice.  She is establishing care and has complaint of insomnia.  Rash Patient also complains of rash  Past Medical History:  Diagnosis Date   Anxiety and depression    Arthritis    B12 deficiency    pernicious anemia - monthly b12 shots   Cervical cancer ([place]) 12/2008   s/p hysterectomy   Crohn's disease ([place]) 2000   h/o stenotic crohn's ileitis, active in TI s/p resections 2000, 2016 PPD neg ([facility] Dr. [name]); has been on cimzia, remicade, 6MP, entocort, now stable on Entyvio ([place] at [facility])   Genital warts    GERD (gastroesophageal reflux disease)    severe, daily sxs if off PPI   Hepatic steatosis 04/2011   diffuse on CT, 78m gallbladder polyp   History of anemia    attributed to crohn's   History of chicken pox    History of syphilis 1980s   HTN (hypertension)    Migraines    and frequent other headaches (sinus or stress)   Scalp psoriasis    Sensorineural hearing loss of both ears 12/2012   high freq   Takotsubo syndrome 07/2012   cath 08/01/12, normal coronaries, LVEF 65%   TIA (transient ischemic attack) [facility]   [facility] vs complex migraine (w/u negative - carotids, echo, TC doppler, and MRI normal)   Tobacco abuse    Past Surgical History:  Procedure Laterality Date   [place] 07/2012   normal LV fxn, widely  patent coronaries   CERVICAL BIOPSY  W/ LOOP ELECTRODE EXCISION  10/2008   COLON RESECTION  10/2014   [facility] scar tissue [facility]   COLONOSCOPY  10/2008   ileo-colonic anastomosis, ielocolonic crohn's   COLONOSCOPY  05/2011   ileo-colonic anastomosis normal, normal mucosa   COLONOSCOPY  10/15/2012   focal inflammation at anastomosis, no active crohn's, planning MR enterograph ([name]   COLONOSCOPY  09/2014   [place]   COLONOSCOPY WITH PROPOFOL N/A 10/10/2022   Procedure: COLONOSCOPY WITH PROPOFOL;  Surgeon: VLin Landsman MD;  [facility];  Service: Gastroenterology;  Laterality: N/A;   ESOPHAGOGASTRODUODENOSCOPY  05/2011   nl esophagus, gastritis, nl duodenum   ESOPHAGOGASTRODUODENOSCOPY (EGD) WITH PROPOFOL N/A 10/10/2022   Procedure: ESOPHAGOGASTRODUODENOSCOPY (EGD) WITH PROPOFOL;  Surgeon: VLin Landsman MD;  [facility]  Service: Gastroenterology;  Laterality: N/A;   KNEE SURGERY Left    LEFT HEART CATH AND CORONARY ANGIOGRAPHY Left 04/15/2020   Procedure: LEFT HEART CATH AND CORONARY ANGIOGRAPHY;  Surgeon: CYolonda Kida MD;  [facility];  Service: Cardiovascular;  Laterality: Left;   LEFT HEART CATHETERIZATION WITH CORONARY ANGIOGRAM N/A 08/01/2012   Procedure: LEFT HEART CATHETERIZATION WITH CORONARY ANGIOGRAM;  Surgeon: MSherren Mocha MD;  [facility];  Service: Cardiovascular;  Laterality: N/A;   RIGHT COLECTOMY  2000   crohn's disease, ileocecal resection   SHOULDER ARTHROSCOPY W/ ROTATOR CUFF REPAIR Right 05/31/2012   [facility] Tamera Punt)   US ECHOCARDIOGRAPHY  05/2010   nl LV fxn ,EF 60%   VAGINAL HYSTERECTOMY  12/2008   LAVH/BSO   Family Status  Relation Name Status   Mother  Deceased   Father  Deceased   Sister  (Not Specified)   Sister  Alive   Daughter  Alive   Daughter  Alive   Field seismologist  (Not Specified)   Annamarie Major  (Not Specified)   PGM  (Not  Specified)   Neg Hx  (Not Specified)   Family History  Problem Relation Age of Onset   Stroke Mother    Hypertension Mother    Hyperlipidemia Mother    Hypertension Father    Crohn's disease Father    Cancer Sister        ovarian   Crohn's disease Sister    Crohn's disease Daughter    Ulcerative colitis Daughter    Cancer Paternal Aunt        Brain   Crohn's disease Paternal Aunt    Crohn's disease Paternal Uncle    Stroke Paternal Grandmother    Diabetes Neg Hx    Social History   Socioeconomic History   Marital status: Divorced    Spouse name: Not on file   Number of children: 3   Years of education: Not on file   Highest education level: Bachelor's degree (e.g., BA, AB, BS)  Occupational History   Occupation: Nursing Tech    Comment: [employer]  Tobacco Use   Smoking status: Former    Packs/day: 0.33    Years: 10.00    Total pack years: 3.30    Types: Cigarettes    Quit date: 11/14/2016    Years since quitting: 5.9   Smokeless tobacco: Never   Tobacco comments:    Quit within past yr  Vaping Use   Vaping Use: Never used  Substance and Sexual Activity   Alcohol use: Yes    Comment: occ   Drug use: No    Comment: extensive drug use, remotely   Sexual activity: Not Currently    Birth control/protection: Surgical, Post-menopausal  Other Topics Concern   Not on file  Social History Narrative   Not on file   Social Determinants of Health   Financial Resource Strain: Not on file  Food Insecurity: Not on file  Transportation Needs: Not on file  Physical Activity: Unknown (02/12/2018)   Exercise Vital Sign    Days of Exercise per Week: 5 days    Minutes of Exercise per Session: Not on file  Stress: No Stress Concern Present (02/12/2018)   [place]    Feeling of Stress : Only a little  Social Connections: Not on file   Outpatient Medications Prior to Visit  Medication Sig   acetaminophen  (TYLENOL) 500 MG tablet Take 1,000-1,500 mg by mouth 2 (two) times daily as needed for moderate pain or headache.   aspirin EC 81 MG tablet Take 1 tablet (81 mg total) by mouth daily. Over the counter   Carboxymethylcellul-Glycerin (LUBRICATING EYE DROPS OP) Place 1 drop into both eyes daily as needed (dry eyes).   diphenhydrAMINE (BENADRYL) 25 mg capsule Take 50 mg by mouth every 8 (eight) hours as needed for allergies.    escitalopram (LEXAPRO) 20  MG tablet Take 1 tablet (20 mg total) by mouth daily.   Fremanezumab-vfrm (AJOVY) 225 MG/1.5ML SOAJ Inject '225mg'$  subcutaneously monthly   Melatonin 10 MG CAPS Take 10 mg by mouth at bedtime.   metoprolol tartrate (LOPRESSOR) 50 MG tablet Take 1 tablet (50 mg total) by mouth 2 (two) times daily for 30 days   nortriptyline (PAMELOR) 25 MG capsule TAKE 1 CAPSULE BY MOUTH NIGHTLY WITH '75MG'$  CAPSULE TO EQUAL '100MG'$  NIGHTLY   nortriptyline (PAMELOR) 75 MG capsule Take 1 capsule (75 mg total) by mouth at bedtime Take with '25mg'$  tablet to equal '100mg'$  nightly   omeprazole (PRILOSEC) 20 MG capsule Take 20 mg by mouth daily.   rosuvastatin (CRESTOR) 5 MG tablet Take 1 tablet (5 mg total) by mouth at bedtime.   traMADol (ULTRAM) 50 MG tablet Take 50 mg by mouth every 6 (six) hours as needed.   vedolizumab (ENTYVIO) 300 MG injection Inject into the vein every 8 (eight) weeks.   vitamin B-12 (CYANOCOBALAMIN) 500 MCG tablet Take 500 mcg by mouth daily.   No facility-administered medications prior to visit.   Allergies  Allergen Reactions   Infliximab Dermatitis   Codeine Nausea And Vomiting   Flagyl [Metronidazole] Nausea And Vomiting   Hydrocodone Nausea And Vomiting   Ibuprofen Other (See Comments)    Pt states that she is unable to take due to her Crohn's disease.   Remeron [Mirtazapine] Other (See Comments)    Reaction:  Makes pt lethargic     Immunization History  Administered Date(s) Administered   Hep A / Hep B 04/03/2013, 10/08/2013   Hepatitis B,  ADULT 06/04/2013   Influenza Split 08/02/2012   Influenza, Quadrivalent, Recombinant, Inj, Pf 05/30/2017   Influenza,inj,Quad PF,6+ Mos 06/04/2013, 09/24/2015, 08/10/2016   Influenza-Unspecified 08/30/2015   PFIZER Comirnaty(Gray Top)Covid-19 Tri-Sucrose Vaccine 09/04/2019, 09/23/2019, 07/03/2020   PFIZER(Purple Top)SARS-COV-2 Vaccination 07/03/2020   PPD Test 07/02/2014, 03/27/2018   Pneumococcal Conjugate-13 07/02/2014   Pneumococcal Polysaccharide-23 11/10/2011   Tdap 11/10/2011    Health Maintenance  Topic Date Due   Zoster Vaccines- Shingrix (1 of 2) Never done   MAMMOGRAM  01/31/2019   DTaP/Tdap/Td (2 - Td or Tdap) 11/09/2021   COVID-19 Vaccine (5 - 2023-24 season) 04/29/2022   INFLUENZA VACCINE  11/27/2022 (Originally 03/29/2022)   COLONOSCOPY (Pts 45-8yr Insurance coverage will need to be confirmed)  10/10/2029   Hepatitis C Screening  Completed   HIV Screening  Completed   HPV VACCINES  Aged Out   PAP SMEAR-Modifier  Discontinued    Patient Care Team: RHaydee Salter MD as PCP - General (Family Medicine) Mhoon, JLarkin Ina MD (Psychiatry)  Review of Systems  Constitutional: Negative.   HENT: Negative.    Eyes: Negative.   Respiratory: Negative.    Cardiovascular: Negative.   Gastrointestinal: Negative.   Endocrine: Negative.   Genitourinary: Negative.   Musculoskeletal: Negative.   Skin: Negative.   Allergic/Immunologic: Negative.   Neurological: Negative.   Hematological: Negative.   Psychiatric/Behavioral: Negative.      {Labs  Heme  Chem  Endocrine  Serology  Results Review (optional):23779}   Objective    There were no vitals taken for this visit. {Show previous vital signs (optional):23777}  Physical Exam ***  Depression Screen    07/19/2022   10:01 AM 03/27/2018   12:53 PM 01/24/2018   12:54 PM 01/24/2018   12:28 PM  PHQ 2/9 Scores  PHQ - 2 Score '2 2 2 '$ 0  PHQ- 9 Score 6 11 14  No results found for any visits on 10/26/22.   Assessment & Plan     ***  No follow-ups on file.     {provider attestation***:1}   Sharon Speak, PA-C  [place] [phone] (phone) [phone] (fax)  [place]

## 2022-10-26 ENCOUNTER — Ambulatory Visit: Payer: Self-pay | Admitting: Physician Assistant

## 2022-10-26 MED ORDER — RIFAXIMIN 550 MG PO TABS
550.0000 mg | ORAL_TABLET | Freq: Three times a day (TID) | ORAL | 0 refills | Status: AC
  Filled 2022-10-28 – 2022-11-01 (×4): qty 42, 14d supply, fill #0

## 2022-10-26 MED ORDER — ONDANSETRON HCL 4 MG PO TABS
4.0000 mg | ORAL_TABLET | Freq: Three times a day (TID) | ORAL | 0 refills | Status: DC | PRN
  Filled 2022-10-28: qty 30, 10d supply, fill #0

## 2022-10-28 ENCOUNTER — Other Ambulatory Visit: Payer: Self-pay

## 2022-10-31 ENCOUNTER — Telehealth: Payer: Self-pay

## 2022-10-31 NOTE — Telephone Encounter (Signed)
The request has been approved. The authorization is effective for a maximum of 1 fills from 10/28/2022 to 12/26/2022, as long as the member is enrolled in their current health plan. A written notification letter will follow with additional details.. Authorization Expiration Date: December 26, 2022.

## 2022-10-31 NOTE — Telephone Encounter (Signed)
Submitted PA through cover my meds for Xifaxan '550mg'$ . Waiting on response from insurance company

## 2022-11-01 ENCOUNTER — Other Ambulatory Visit: Payer: Self-pay

## 2022-11-03 NOTE — Telephone Encounter (Signed)
1st no show, fee waived, letter sent to reschedule/notify of policy

## 2022-11-21 ENCOUNTER — Other Ambulatory Visit: Payer: Self-pay

## 2022-11-22 ENCOUNTER — Telehealth: Payer: Commercial Managed Care - PPO | Admitting: Gastroenterology

## 2022-11-28 ENCOUNTER — Other Ambulatory Visit: Payer: Self-pay

## 2022-11-29 ENCOUNTER — Other Ambulatory Visit: Payer: Self-pay

## 2022-12-05 ENCOUNTER — Ambulatory Visit: Payer: Commercial Managed Care - PPO | Admitting: Gastroenterology

## 2022-12-05 ENCOUNTER — Encounter: Payer: Self-pay | Admitting: Gastroenterology

## 2022-12-08 ENCOUNTER — Ambulatory Visit: Payer: Self-pay | Admitting: Family Medicine

## 2022-12-30 ENCOUNTER — Other Ambulatory Visit: Payer: Self-pay

## 2023-01-17 ENCOUNTER — Telehealth: Payer: Self-pay | Admitting: Family Medicine

## 2023-01-31 ENCOUNTER — Ambulatory Visit: Payer: Self-pay | Admitting: Family Medicine

## 2023-03-16 ENCOUNTER — Emergency Department (HOSPITAL_COMMUNITY)
Admission: EM | Admit: 2023-03-16 | Discharge: 2023-03-16 | Disposition: A | Discharge status: Home / Self Care | Payer: HMO | Attending: Emergency Medicine | Admitting: Emergency Medicine

## 2023-03-16 ENCOUNTER — Other Ambulatory Visit: Payer: Self-pay

## 2023-03-16 DIAGNOSIS — Z7982 Long term (current) use of aspirin: Secondary | ICD-10-CM | POA: Insufficient documentation

## 2023-03-16 DIAGNOSIS — F332 Major depressive disorder, recurrent severe without psychotic features: Secondary | ICD-10-CM | POA: Diagnosis not present

## 2023-03-16 DIAGNOSIS — F419 Anxiety disorder, unspecified: Secondary | ICD-10-CM | POA: Diagnosis not present

## 2023-03-16 DIAGNOSIS — F4312 Post-traumatic stress disorder, chronic: Secondary | ICD-10-CM | POA: Diagnosis present

## 2023-03-16 DIAGNOSIS — F22 Delusional disorders: Secondary | ICD-10-CM

## 2023-03-16 DIAGNOSIS — R4589 Other symptoms and signs involving emotional state: Secondary | ICD-10-CM

## 2023-03-16 LAB — COMPREHENSIVE METABOLIC PANEL
ALT: 27 U/L (ref 0–44)
AST: 57 U/L — ABNORMAL HIGH (ref 15–41)
Albumin: 3.6 g/dL (ref 3.5–5.0)
Alkaline Phosphatase: 124 U/L (ref 38–126)
Anion gap: 6 (ref 5–15)
BUN: 10 mg/dL (ref 8–23)
CO2: 22 mmol/L (ref 22–32)
Calcium: 8.7 mg/dL — ABNORMAL LOW (ref 8.9–10.3)
Chloride: 109 mmol/L (ref 98–111)
Creatinine, Ser: 0.82 mg/dL (ref 0.44–1.00)
GFR, Estimated: >60 mL/min (ref >60)
Glucose, Bld: 93 mg/dL (ref 70–99)
Potassium: 3.8 mmol/L (ref 3.5–5.1)
Sodium: 137 mmol/L (ref 135–145)
Total Bilirubin: 0.8 mg/dL (ref 0.3–1.2)
Total Protein: 8.2 g/dL — ABNORMAL HIGH (ref 6.5–8.1)

## 2023-03-16 LAB — CBC
HCT: 37.7 % (ref 36.0–46.0)
Hemoglobin: 12.7 g/dL (ref 12.0–15.0)
MCH: 30.1 pg (ref 26.0–34.0)
MCHC: 33.7 g/dL (ref 30.0–36.0)
MCV: 89.3 fL (ref 80.0–100.0)
Platelets: 174 10*3/uL (ref 150–400)
RBC: 4.22 MIL/uL (ref 3.87–5.11)
RDW: 14.9 % (ref 11.5–15.5)
WBC: 8.9 10*3/uL (ref 4.0–10.5)
nRBC: 0 % (ref 0.0–0.2)

## 2023-03-16 LAB — RAPID URINE DRUG SCREEN, HOSP PERFORMED
Amphetamines: NOT DETECTED
Barbiturates: NOT DETECTED
Benzodiazepines: NOT DETECTED
Cocaine: NOT DETECTED
Opiates: NOT DETECTED
Tetrahydrocannabinol: NOT DETECTED

## 2023-03-16 LAB — ACETAMINOPHEN LEVEL: Acetaminophen (Tylenol), Serum: <10 ug/mL — ABNORMAL LOW (ref 10–30)

## 2023-03-16 LAB — SALICYLATE LEVEL: Salicylate Lvl: <7 mg/dL — ABNORMAL LOW (ref 7.0–30.0)

## 2023-03-16 LAB — ETHANOL: Alcohol, Ethyl (B): <10 mg/dL (ref <10)

## 2023-03-16 MED ORDER — TRAZODONE HCL 50 MG PO TABS: 25.0000 mg | ORAL_TABLET | Freq: Three times a day (TID) | ORAL | 0 refills | Status: DC | PRN

## 2023-03-16 MED ORDER — ESCITALOPRAM OXALATE 20 MG PO TABS: 20.0000 mg | ORAL_TABLET | Freq: Every day | ORAL | 0 refills | Status: DC

## 2023-03-16 MED ORDER — ABILIFY 5 MG PO TABS: 5.0000 mg | ORAL_TABLET | Freq: Every day | ORAL | 0 refills | Status: DC

## 2023-03-16 NOTE — ED Provider Notes (Addendum)
[place] EMERGENCY DEPARTMENT AT [facility] Provider Note   CSN: [id number] Arrival date & time: 03/16/23  4010     History  Chief Complaint  Patient presents with   Suicidal   Paranoid    Sharon [name] is a 67 y.o. female.  Pt indicates hx anxiety/depression, and has been more stressed, worried, in past 1-2 weeks. Denies recent stressor or inciting event. Back in March did have to quit job due to job issues/stress, and that did serve as a stressor then.  Has some trouble sleeping at night due to racing and/or paranoid thoughts - at times feels 'worthless'.  Intermittent fleeting suicidal thoughts, but adds quickly that 'I know I would never harm self' because of family/others - indicates w family has fairly good support network.  Normal appetite. No wt loss. Denies hallucinations. On lexapro, feels like it helps some.   The history is provided by the patient and medical records.       Home Medications Prior to Admission medications   Medication Sig Start Date End Date Taking? Authorizing Provider  acetaminophen (TYLENOL) 500 MG tablet Take 1,000-1,500 mg by mouth 2 (two) times daily as needed for moderate pain or headache.    [provider]  amitriptyline (ELAVIL) 50 MG tablet Take 50 mg by mouth at bedtime as needed. 05/01/15   [provider]  aspirin EC 81 MG tablet Take 1 tablet (81 mg total) by mouth daily. Over the counter 05/01/16   Rai, Delene Ruffini, MD  Carboxymethylcellul-Glycerin (LUBRICATING EYE DROPS OP) Place 1 drop into both eyes daily as needed (dry eyes).    [provider]  diphenhydrAMINE (BENADRYL) 25 mg capsule Take 50 mg by mouth every 8 (eight) hours as needed for allergies.     [provider]  Docusate Sodium (DSS) 100 MG CAPS Take by mouth. 07/05/17   [provider]  escitalopram (LEXAPRO) 20 MG tablet Take 1 tablet (20 mg total) by mouth daily. 08/15/22   Loyola Mast, MD  Fremanezumab-vfrm  (AJOVY) 225 MG/1.5ML SOAJ Inject 225mg  subcutaneously monthly 11/18/21     gabapentin (NEURONTIN) 100 MG capsule Take 100 mg by mouth 3 (three) times daily. 11/14/16   [provider]  HYDROcodone-acetaminophen (NORCO/VICODIN) 5-325 MG tablet Take 1 tablet by mouth every 6 (six) hours as needed. 07/05/17   [provider]  LORazepam (ATIVAN) 0.5 MG tablet Take 0.5 mg by mouth. 01/26/18   [provider]  Melatonin 10 MG CAPS Take 10 mg by mouth at bedtime.    [provider]  metoprolol tartrate (LOPRESSOR) 50 MG tablet Take 1 tablet (50 mg total) by mouth 2 (two) times daily for 30 days 08/19/22   Loyola Mast, MD  nortriptyline (PAMELOR) 25 MG capsule TAKE 1 CAPSULE BY MOUTH NIGHTLY WITH 75MG  CAPSULE TO EQUAL 100MG  NIGHTLY 08/11/22   Loyola Mast, MD  nortriptyline (PAMELOR) 75 MG capsule Take 1 capsule (75 mg total) by mouth at bedtime Take with 25mg  tablet to equal 100mg  nightly 08/11/22   Loyola Mast, MD  omeprazole (PRILOSEC) 20 MG capsule Take 20 mg by mouth daily.    [provider]  ondansetron (ZOFRAN) 4 MG tablet Take 1 tablet (4 mg total) by mouth every 8 (eight) hours as needed for nausea or vomiting. 10/26/22   Vanga, Loel Dubonnet, MD  ondansetron (ZOFRAN-ODT) 8 MG disintegrating tablet Take 8 mg by mouth every 8 (eight) hours as needed. 05/23/18   [provider]  rosuvastatin (CRESTOR) 5 MG tablet Take 1 tablet (5 mg total) by mouth at bedtime. 07/19/22   Loyola Mast, MD  traMADol (ULTRAM) 50 MG tablet Take 50 mg by mouth every 6 (six) hours as needed. 06/09/22   [provider]  traZODone (DESYREL) 50 MG tablet Take 50 mg by mouth at bedtime. 03/27/18   [provider]  vedolizumab (ENTYVIO) 300 MG injection Inject into the vein every 8 (eight) weeks.    [provider]  vitamin B-12 (CYANOCOBALAMIN) 500 MCG tablet Take 500 mcg by mouth daily.    [provider]      Allergies     Infliximab, Codeine, Flagyl [metronidazole], Hydrocodone, Ibuprofen, and Remeron [mirtazapine]    Review of Systems   Review of Systems  Constitutional:  Negative for fever.  Respiratory:  Negative for shortness of breath.   Cardiovascular:  Negative for chest pain.  Gastrointestinal:  Negative for abdominal pain.  Genitourinary:  Negative for dysuria.  Neurological:  Negative for headaches.  Psychiatric/Behavioral:  Positive for dysphoric mood. The patient is nervous/anxious.     Physical Exam Updated Vital Signs BP 134/74 (BP Location: Right Arm)   Pulse 61   Temp 98.2 F (36.8 C)   Resp 18   Ht 1.562 m (5' 1.5")   Wt 77.1 kg   SpO2 95%   BMI 31.60 kg/m  Physical Exam Vitals and nursing note reviewed.  Constitutional:      Appearance: Normal appearance. She is well-developed.  HENT:     Head: Atraumatic.     Nose: Nose normal.     Mouth/Throat:     Mouth: Mucous membranes are moist.  Eyes:     General: No scleral icterus.    Conjunctiva/sclera: Conjunctivae normal.     Pupils: Pupils are equal, round, and reactive to light.  Neck:     Trachea: No tracheal deviation.  Cardiovascular:     Rate and Rhythm: Normal rate and regular rhythm.     Pulses: Normal pulses.     Heart sounds: Normal heart sounds. No murmur heard.    No friction rub. No gallop.  Pulmonary:     Effort: Pulmonary effort is normal. No respiratory distress.     Breath sounds: Normal breath sounds.  Abdominal:     General: There is no distension.     Palpations: Abdomen is soft.     Tenderness: There is no abdominal tenderness.  Musculoskeletal:        General: No swelling or tenderness.     Cervical back: Normal range of motion and neck supple. No rigidity. No muscular tenderness.  Skin:    General: Skin is warm and dry.     Findings: No rash.  Neurological:     Mental Status: She is alert.     Comments: Alert, speech normal. Motor/sens grossly intact. Steady gait.   Psychiatric:         Mood and Affect: Mood normal.     ED Results / Procedures / Treatments   Labs (all labs ordered are listed, but only abnormal results are displayed) Results for orders placed or performed during the hospital encounter of 03/16/23  Comprehensive metabolic panel  Result Value Ref Range   Sodium 137 135 - 145 mmol/L   Potassium 3.8 3.5 - 5.1 mmol/L   Chloride 109 98 - 111 mmol/L   CO2 22 22 - 32 mmol/L   Glucose, Bld 93 70 - 99 mg/dL   BUN 10  8 - 23 mg/dL   Creatinine, Ser 1.61 0.44 - 1.00 mg/dL   Calcium 8.7 (L) 8.9 - 10.3 mg/dL   Total Protein 8.2 (H) 6.5 - 8.1 g/dL   Albumin 3.6 3.5 - 5.0 g/dL   AST 57 (H) 15 - 41 U/L   ALT 27 0 - 44 U/L   Alkaline Phosphatase 124 38 - 126 U/L   Total Bilirubin 0.8 0.3 - 1.2 mg/dL   GFR, Estimated >09 >60 mL/min   Anion gap 6 5 - 15  Ethanol  Result Value Ref Range   Alcohol, Ethyl (B) <10 <10 mg/dL  Salicylate level  Result Value Ref Range   Salicylate Lvl <7.0 (L) 7.0 - 30.0 mg/dL  Acetaminophen level  Result Value Ref Range   Acetaminophen (Tylenol), Serum <10 (L) 10 - 30 ug/mL  cbc  Result Value Ref Range   WBC 8.9 4.0 - 10.5 K/uL   RBC 4.22 3.87 - 5.11 MIL/uL   Hemoglobin 12.7 12.0 - 15.0 g/dL   HCT 45.4 09.8 - 11.9 %   MCV 89.3 80.0 - 100.0 fL   MCH 30.1 26.0 - 34.0 pg   MCHC 33.7 30.0 - 36.0 g/dL   RDW 14.7 82.9 - 56.2 %   Platelets 174 150 - 400 K/uL   nRBC 0.0 0.0 - 0.2 %  Rapid urine drug screen (hospital performed)  Result Value Ref Range   Opiates NONE DETECTED NONE DETECTED   Cocaine NONE DETECTED NONE DETECTED   Benzodiazepines NONE DETECTED NONE DETECTED   Amphetamines NONE DETECTED NONE DETECTED   Tetrahydrocannabinol NONE DETECTED NONE DETECTED   Barbiturates NONE DETECTED NONE DETECTED      EKG None  Radiology No results found.  Procedures Procedures    Medications Ordered in ED Medications - No data to display  ED Course/ Medical Decision Making/ A&P                             Medical  Decision Making Problems Addressed: Anxiety: acute illness or injury    Details: Acute/chronic Depressed mood: acute illness or injury with systemic symptoms that poses a threat to life or bodily functions Paranoid ideation (HCC): acute illness or injury with systemic symptoms that poses a threat to life or bodily functions  Amount and/or Complexity of Data Reviewed External Data Reviewed: notes. Labs: ordered. Decision-making details documented in ED Course. Discussion of management or test interpretation with external provider(s): BH team  Risk Prescription drug management. Decision regarding hospitalization.   Labs ordered/sent.   Differential diagnosis includes anxiety, depression, mood disorder, etc. Dispo decision including potential need for admission considered - will get labs and reassess.   Reviewed nursing notes and prior charts for additional history. External reports reviewed.   Labs reviewed/interpreted by me - chem normal. Wbc and hgb normal.   Behavioral health consult placed.   The patient has been placed in psychiatric observation due to the need to provide a safe environment for the patient while obtaining psychiatric consultation and evaluation, as well as ongoing medical and medication management to treat the patient's condition.  The patient has not been placed under full IVC at this time.  Disposition per [facility] team.   [facility] team has assessed, psych cleared for ED discharge, adjusted patients meds, and arranged for prompt outpatient BH follow up.  Pt currently appears stable for d/c per [facility] plan.           Final  Clinical Impression(s) / ED Diagnoses Final diagnoses:  None    Rx / DC Orders ED Discharge Orders     None          Cathren Laine, MD 03/16/23 1448

## 2023-03-16 NOTE — ED Notes (Signed)
Pt belongings returned. Discharge instructions reviewed with patient. Questions answered. Pt verbalizes understanding. Pt ambulatory to lobby with steady gait.

## 2023-03-16 NOTE — Discharge Instructions (Addendum)
Follow-up  [facility] behavioral health urgent care center Time: Please arrive upon opening hours for walk-in appointments Location address: [address], [place], [place] [address]  Medications  Please pick up medications at pharmacy requested  It was our pleasure to provide your ER care today - we hope that you feel better.  Follow up closely with primary care doctor and behavioral health provider in the coming week.  For mental health issues and/or crisis, you may also go directly to the Behavioral Health Urgent Care Center - they are open 24/7 and walk-ins are welcome.    Return to ER if worse, new symptoms, fevers, chest pain, trouble breathing, or other emergency concern.
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
## 2023-03-16 NOTE — ED Triage Notes (Signed)
Pt. Stated, Sharon [name] been having some suicidal thoughts with paranoia for a week. Ive had this before but not this bad.  I do have anxiety and I have paranoia but its just worse this week. I know what is real and not I just can't control it.

## 2023-03-16 NOTE — ED Notes (Signed)
Pt has been dressed out in scrubs. Belongings put in purple zone in locker 5. Pt has been wanded by security.

## 2023-03-17 ENCOUNTER — Telehealth: Payer: Self-pay | Admitting: Surgery

## 2023-03-17 NOTE — Telephone Encounter (Signed)
ED TOC team received call concerning BH medications, message forwarded to Arsenio Loader NP at [facility] to follow up, updated patient.  No further ED RNCM needs identified.

## 2023-04-12 ENCOUNTER — Other Ambulatory Visit: Payer: Self-pay | Admitting: Family Medicine

## 2023-04-26 ENCOUNTER — Other Ambulatory Visit: Payer: Self-pay | Admitting: Family Medicine

## 2023-05-12 ENCOUNTER — Telehealth: Payer: Self-pay | Admitting: Gastroenterology

## 2023-05-12 NOTE — Telephone Encounter (Signed)
Patient called in to schedule a follow up

## 2023-05-23 ENCOUNTER — Other Ambulatory Visit: Payer: Self-pay | Admitting: Family Medicine

## 2023-05-23 NOTE — Telephone Encounter (Signed)
Refill request  ABILIFY 5 MG tablet [[id number]]   traZODone (DESYREL) 50 MG tablet [[id number]]   [facility] [address], [place] - [address] [place] [address] Phone: [phone]  Fax: [phone]

## 2023-05-23 NOTE — Telephone Encounter (Signed)
Lft VM to rtn call. Dm/cma

## 2023-05-24 NOTE — Telephone Encounter (Signed)
Pt returning call, please advise pt at [phone]

## 2023-05-25 NOTE — Telephone Encounter (Signed)
Can you fill either of these medications?  Scheduled with new PCP 05/26/23.  Dm/cma

## 2023-05-25 NOTE — Addendum Note (Signed)
Addended by: Waymond Cera on: 05/25/2023 11:55 AM   Modules accepted: Orders

## 2023-05-25 NOTE — Telephone Encounter (Signed)
Patient advised of providers recommendations and offered the numb er to [facility]. She states that she already has that number and says thank you for trying.   No other questions. Dm/cma

## 2023-05-26 ENCOUNTER — Ambulatory Visit (INDEPENDENT_AMBULATORY_CARE_PROVIDER_SITE_OTHER): Payer: PPO | Admitting: Nurse Practitioner

## 2023-05-26 ENCOUNTER — Encounter: Payer: Self-pay | Admitting: Nurse Practitioner

## 2023-05-26 VITALS — BP 110/66 | HR 51 | Temp 98.2°F | Ht 61.0 in | Wt 163.6 lb

## 2023-05-26 DIAGNOSIS — Z1231 Encounter for screening mammogram for malignant neoplasm of breast: Secondary | ICD-10-CM

## 2023-05-26 DIAGNOSIS — Z1329 Encounter for screening for other suspected endocrine disorder: Secondary | ICD-10-CM | POA: Diagnosis not present

## 2023-05-26 DIAGNOSIS — F5104 Psychophysiologic insomnia: Secondary | ICD-10-CM | POA: Diagnosis not present

## 2023-05-26 DIAGNOSIS — F419 Anxiety disorder, unspecified: Secondary | ICD-10-CM

## 2023-05-26 DIAGNOSIS — F32A Depression, unspecified: Secondary | ICD-10-CM | POA: Diagnosis not present

## 2023-05-26 DIAGNOSIS — E785 Hyperlipidemia, unspecified: Secondary | ICD-10-CM

## 2023-05-26 DIAGNOSIS — K50919 Crohn's disease, unspecified, with unspecified complications: Secondary | ICD-10-CM

## 2023-05-26 DIAGNOSIS — I10 Essential (primary) hypertension: Secondary | ICD-10-CM

## 2023-05-26 DIAGNOSIS — E538 Deficiency of other specified B group vitamins: Secondary | ICD-10-CM | POA: Diagnosis not present

## 2023-05-26 DIAGNOSIS — Z131 Encounter for screening for diabetes mellitus: Secondary | ICD-10-CM

## 2023-05-26 LAB — CBC WITH DIFFERENTIAL/PLATELET
Basophils Absolute: 0.1 10*3/uL (ref 0.0–0.1)
Basophils Relative: 0.7 % (ref 0.0–3.0)
Eosinophils Absolute: 0.2 10*3/uL (ref 0.0–0.7)
Eosinophils Relative: 1.7 % (ref 0.0–5.0)
HCT: 36.9 % (ref 36.0–46.0)
Hemoglobin: 11.9 g/dL — ABNORMAL LOW (ref 12.0–15.0)
Lymphocytes Relative: 35.7 % (ref 12.0–46.0)
Lymphs Abs: 3.6 10*3/uL (ref 0.7–4.0)
MCHC: 32.4 g/dL (ref 30.0–36.0)
MCV: 90.2 fL (ref 78.0–100.0)
Monocytes Absolute: 0.6 10*3/uL (ref 0.1–1.0)
Monocytes Relative: 6.2 % (ref 3.0–12.0)
Neutro Abs: 5.6 10*3/uL (ref 1.4–7.7)
Neutrophils Relative %: 55.7 % (ref 43.0–77.0)
Platelets: 214 10*3/uL (ref 150.0–400.0)
RBC: 4.09 Mil/uL (ref 3.87–5.11)
RDW: 15.6 % — ABNORMAL HIGH (ref 11.5–15.5)
WBC: 10 10*3/uL (ref 4.0–10.5)

## 2023-05-26 LAB — COMPREHENSIVE METABOLIC PANEL
ALT: 14 U/L (ref 0–35)
AST: 36 U/L (ref 0–37)
Albumin: 3.4 g/dL — ABNORMAL LOW (ref 3.5–5.2)
Alkaline Phosphatase: 98 U/L (ref 39–117)
BUN: 12 mg/dL (ref 6–23)
CO2: 30 meq/L (ref 19–32)
Calcium: 9 mg/dL (ref 8.4–10.5)
Chloride: 103 meq/L (ref 96–112)
Creatinine, Ser: 0.85 mg/dL (ref 0.40–1.20)
GFR: 71.85 mL/min (ref >60.00)
Glucose, Bld: 94 mg/dL (ref 70–99)
Potassium: 4.3 meq/L (ref 3.5–5.1)
Sodium: 139 meq/L (ref 135–145)
Total Bilirubin: 0.8 mg/dL (ref 0.2–1.2)
Total Protein: 7.4 g/dL (ref 6.0–8.3)

## 2023-05-26 LAB — VITAMIN B12: Vitamin B-12: 205 pg/mL — ABNORMAL LOW (ref 211–911)

## 2023-05-26 LAB — LIPID PANEL
Cholesterol: 136 mg/dL (ref 0–200)
HDL: 54.6 mg/dL (ref >39.00)
LDL Cholesterol: 64 mg/dL (ref 0–99)
NonHDL: 81.36
Total CHOL/HDL Ratio: 2
Triglycerides: 86 mg/dL (ref 0.0–149.0)
VLDL: 17.2 mg/dL (ref 0.0–40.0)

## 2023-05-26 LAB — HEMOGLOBIN A1C: Hgb A1c MFr Bld: 5.5 % (ref 4.6–6.5)

## 2023-05-26 LAB — TSH: TSH: 1.64 u[IU]/mL (ref 0.35–5.50)

## 2023-05-26 LAB — VITAMIN D 25 HYDROXY (VIT D DEFICIENCY, FRACTURES): VITD: 23.91 ng/mL — ABNORMAL LOW (ref 30.00–100.00)

## 2023-05-26 MED ORDER — TRAZODONE HCL 50 MG PO TABS
50.0000 mg | ORAL_TABLET | Freq: Every day | ORAL | 3 refills | Status: DC
Start: 2023-05-26 — End: 2023-05-31

## 2023-05-26 MED ORDER — ESCITALOPRAM OXALATE 20 MG PO TABS
20.0000 mg | ORAL_TABLET | Freq: Every day | ORAL | 3 refills | Status: DC
Start: 2023-05-26 — End: 2024-02-19

## 2023-05-26 MED ORDER — ROSUVASTATIN CALCIUM 5 MG PO TABS
5.0000 mg | ORAL_TABLET | Freq: Every day | ORAL | 3 refills | Status: DC
Start: 2023-05-26 — End: 2024-04-03

## 2023-05-26 MED ORDER — ARIPIPRAZOLE 5 MG PO TABS
5.0000 mg | ORAL_TABLET | Freq: Every day | ORAL | 3 refills | Status: DC
Start: 2023-05-26 — End: 2024-02-19

## 2023-05-26 MED ORDER — METOPROLOL TARTRATE 25 MG PO TABS: 25.0000 mg | ORAL_TABLET | Freq: Two times a day (BID) | ORAL | 3 refills | Status: DC

## 2023-05-26 NOTE — Patient Instructions (Signed)
YOUR MAMMOGRAM IS DUE, PLEASE CALL AND GET THIS SCHEDULED! [facility] - call [phone]    

## 2023-05-26 NOTE — Progress Notes (Unsigned)
Bethanie Dicker, NP-C Phone: [phone]  Sharon [name] is a 67 y.o. female who presents today to establish care.   HYPERTENSION Disease Monitoring Home BP Monitoring- 100s/60s Chest pain- No    Dyspnea- No Medications Compliance-  Lopressor. Lightheadedness-  No  Edema- No BMET    Component Value Date/Time   NA 139 05/26/2023 0932   NA 140 05/01/2013 0000   NA 143 10/11/2011 1810   K 4.3 05/26/2023 0932   K 3.5 05/01/2013 0000   CL 103 05/26/2023 0932   CL 108 (H) 10/11/2011 1810   CO2 30 05/26/2023 0932   CO2 24 10/11/2011 1810   GLUCOSE 94 05/26/2023 0932   GLUCOSE 104 (H) 10/11/2011 1810   BUN 12 05/26/2023 0932   BUN 13 10/24/2011 0000   BUN 7 10/11/2011 1810   CREATININE 0.85 05/26/2023 0932   CREATININE 0.73 05/01/2013 0000   CALCIUM 9.0 05/26/2023 0932   CALCIUM 9.9 10/24/2011 0000   GFRNONAA >60 03/16/2023 1030   GFRNONAA >60 10/11/2011 1810   GFRAA >60 11/14/2018 0921   GFRAA >60 10/11/2011 1810   HYPERLIPIDEMIA Symptoms Chest pain on exertion:  No   Leg claudication:   No Medications: Compliance- Crestor Right upper quadrant pain- No  Muscle aches- No Lipid Panel     Component Value Date/Time   CHOL 136 05/26/2023 0932   TRIG 86.0 05/26/2023 0932   HDL 54.60 05/26/2023 0932   CHOLHDL 2 05/26/2023 0932   VLDL 17.2 05/26/2023 0932   LDLCALC 64 05/26/2023 0932   Mood- Currently on Abilify and Lexapro. She has an appointment to establish with Psychiatry on October 29th. She is needing refills on her medications. PHQ- 17 and GAD- 12 today. She does have occasional thoughts that she and her family would be better off if she were not around, as she feels like they do not like her. She does not have any plans to hurt herself.   Active Ambulatory Problems    Diagnosis Date Noted   Essential hypertension    Migraines    Crohn's disease (HCC)    Anxiety and depression    Ex-smoker    TIA (transient ischemic attack)    Vitamin B12 deficiency     Leukocytosis 08/16/2011   GERD (gastroesophageal reflux disease)    Bilateral hearing loss 06/27/2012   Takotsubo syndrome    HLD (hyperlipidemia) 02/12/2013   Hepatic steatosis 04/30/2011   Obesity, Class I, BMI 30.0-34.9 (see actual BMI) 01/24/2018   Chronic insomnia 03/27/2018   Transient visual loss of left eye 02/20/2014   Peripheral visual field defect of both eyes 06/16/2014   Nuclear sclerosis of both eyes 02/20/2014   Irritable bowel syndrome 11/22/2012   History of uterine cancer 07/19/2022   Gastric erythema 10/10/2022   Chronic post-traumatic stress disorder (PTSD) 03/16/2023   MDD (major depressive disorder), recurrent severe, 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   Resolved Ambulatory Problems    Diagnosis Date Noted   Healthcare maintenance 08/16/2011   Headache 10/13/2011   Abdominal pain, other specified site 01/13/2012   Crohn's colitis (HCC) 01/13/2012   Abdominal pain, acute, right lower quadrant 01/13/2012   Partial bowel obstruction (HCC) 01/13/2012   History of pneumonia 01/18/2012   Right shoulder pain 03/13/2012   Urgency of urination 03/13/2012   Skin rash 04/27/2012   Thrush 06/05/2012   Fever 06/05/2012   Chest pain 08/02/2012   External otitis of right ear 11/01/2012   Right knee pain 11/13/2012   Diarrhea 12/18/2012  Hyperglycemia 05/09/2013   Hematuria 01/01/2016   Atypical chest pain 04/30/2016   Viral gastroenteritis 11/29/2017   Sialadenitis 10/10/2020   Sepsis (HCC) 10/10/2020   Salivary gland infection 10/11/2020   Long-term use of immunosuppressant medication 06/25/2021   Anxiety 10/14/2014   Past Medical History:  Diagnosis Date   Arthritis    B12 deficiency    Cervical cancer (HCC) 12/2008   Genital warts    History of anemia    History of chicken pox    History of syphilis 1980s   HTN (hypertension)    Scalp psoriasis    Sensorineural hearing loss of both ears 12/2012   Tobacco abuse     Family History  Problem Relation  Age of Onset   Stroke Mother    Hypertension Mother    Hyperlipidemia Mother    Hypertension Father    Crohn's disease Father    Cancer Sister        ovarian   Crohn's disease Sister    Crohn's disease Daughter    Ulcerative colitis Daughter    Cancer Paternal Aunt        Brain   Crohn's disease Paternal Aunt    Crohn's disease Paternal Uncle    Stroke Paternal Grandmother    Diabetes Neg Hx     Social History   Socioeconomic History   Marital status: Divorced    Spouse name: Not on file   Number of children: 3   Years of education: Not on file   Highest education level: Bachelor's degree (e.g., BA, AB, BS)  Occupational History   Occupation: Nursing Tech    Comment: [employer]  Tobacco Use   Smoking status: Former    Current packs/day: 0.00    Average packs/day: 0.3 packs/day for 10.0 years (3.3 ttl pk-yrs)    Types: Cigarettes    Start date: 11/15/2006    Quit date: 11/14/2016    Years since quitting: 6.5   Smokeless tobacco: Never   Tobacco comments:    Quit within past yr  Vaping Use   Vaping status: Never Used  Substance and Sexual Activity   Alcohol use: Yes    Comment: occ   Drug use: No    Comment: extensive drug use, remotely   Sexual activity: Not Currently    Birth control/protection: Surgical, Post-menopausal  Other Topics Concern   Not on file  Social History Narrative   Not on file   Social Determinants of Health   Financial Resource Strain: Not on file  Food Insecurity: Not on file  Transportation Needs: Not on file  Physical Activity: Unknown (02/12/2018)   Exercise Vital Sign    Days of Exercise per Week: 5 days    Minutes of Exercise per Session: Not on file  Stress: No Stress Concern Present (02/12/2018)   [employer] of Occupational Health - Occupational Stress Questionnaire    Feeling of Stress : Only a little  Social Connections: Not on file  Intimate Partner Violence: Not on file    ROS  General:  Negative for unexplained  weight loss, fever Skin: Negative for new or changing mole, sore that won't heal HEENT: Negative for trouble hearing, trouble seeing, ringing in ears, mouth sores, hoarseness, change in voice, dysphagia. CV:  Negative for chest pain, dyspnea, edema, palpitations Resp: Negative for cough, dyspnea, hemoptysis GI: Negative for nausea, vomiting, diarrhea, constipation, abdominal pain, melena, hematochezia. GU: Negative for dysuria, incontinence, urinary hesitance, hematuria, vaginal or penile discharge, polyuria, sexual difficulty, lumps in  testicle or breasts MSK: Negative for muscle cramps or aches, joint pain or swelling Neuro: Negative for headaches, weakness, numbness, dizziness, passing out/fainting Psych: Negative for memory problems  Objective  Physical Exam Vitals:   05/26/23 0852  BP: 110/66  Pulse: (!) 51  Temp: 98.2 F (36.8 C)  SpO2: 95%    BP Readings from Last 3 Encounters:  05/26/23 110/66  03/16/23 129/79  10/10/22 (!) 109/56   Wt Readings from Last 3 Encounters:  05/26/23 163 lb 9.6 oz (74.2 kg)  03/16/23 170 lb (77.1 kg)  10/10/22 168 lb (76.2 kg)    Physical Exam Constitutional:      General: She is not in acute distress.    Appearance: Normal appearance.  HENT:     Head: Normocephalic.  Cardiovascular:     Rate and Rhythm: Normal rate and regular rhythm.     Heart sounds: Normal heart sounds.  Pulmonary:     Effort: Pulmonary effort is normal.     Breath sounds: Normal breath sounds.  Skin:    General: Skin is warm and dry.  Neurological:     General: No focal deficit present.     Mental Status: She is alert.  Psychiatric:        Mood and Affect: Mood normal.        Behavior: Behavior normal.    Assessment/Plan:   Essential hypertension Assessment & Plan: Chronic. Stable. BP soft at home and pulse 51 today, will decrease Lopressor to 25 mg BID. She will continue to check her blood pressure and pulse at home and keep a log to bring to her  next appointment. Will continue to monitor. Return precautions given to patient.   Orders: -     CBC with Differential/Platelet -     Comprehensive metabolic panel -     Metoprolol Tartrate; Take 1 tablet (25 mg total) by mouth 2 (two) times daily.  Dispense: 180 tablet; Refill: 3  Hyperlipidemia, unspecified hyperlipidemia type Assessment & Plan: Chronic. Stable on Crestor 5 mg daily. Continue. Refills sent. Will check lipid panel today. The 10-year ASCVD risk score (Arnett DK, et al., 2019) is: 4.5%.   Orders: -     Rosuvastatin Calcium; Take 1 tablet (5 mg total) by mouth at bedtime.  Dispense: 90 tablet; Refill: 3 -     Lipid panel  Chronic insomnia Assessment & Plan: Currently on Trazodone 25 mg TID, will change to 50 mg at bedtime to help with sleep. Counseled on sleep hygiene. Will continue to monitor.   Orders: -     traZODone HCl; Take 1 tablet (50 mg total) by mouth at bedtime.  Dispense: 90 tablet; Refill: 3  Anxiety and depression Assessment & Plan: Chronic issue. Currently on Lexapro and Abilify. Refills sent. PHQ- 17 and GAD- 12 today. Scheduled to establish with Psychiatry in October. Encouraged to contact if worsening symptoms, unusual behavior changes or suicidal thoughts occur. She will follow up in 4 weeks, sooner PRN.   Orders: -     ARIPiprazole; Take 1 tablet (5 mg total) by mouth at bedtime.  Dispense: 90 tablet; Refill: 3 -     Escitalopram Oxalate; Take 1 tablet (20 mg total) by mouth daily.  Dispense: 90 tablet; Refill: 3 -     VITAMIN D 25 Hydroxy (Vit-D Deficiency, Fractures)  Crohn's disease with complication, unspecified gastrointestinal tract location [facility]) Assessment & Plan: Managed by GI. Not on any medications currently. Stopped [employer]. Encouraged to follow up as scheduled.  Vitamin B12 deficiency -     Vitamin B12  Screening mammogram for breast cancer -     3D Screening Mammogram, Left and Right; Future  Thyroid disorder screen -      TSH  Diabetes mellitus screening -     Hemoglobin A1c    Return in about 4 weeks (around 06/23/2023) for Anxiety/Depression.   Bethanie Dicker, NP-C [facility] - [employer]

## 2023-05-29 ENCOUNTER — Encounter: Payer: Self-pay | Admitting: Nurse Practitioner

## 2023-05-29 NOTE — Assessment & Plan Note (Signed)
Chronic issue. Currently on Lexapro and Abilify. Refills sent. PHQ- 17 and GAD- 12 today. Scheduled to establish with Psychiatry in October. Encouraged to contact if worsening symptoms, unusual behavior changes or suicidal thoughts occur. She will follow up in 4 weeks, sooner PRN.

## 2023-05-29 NOTE — Assessment & Plan Note (Signed)
Managed by GI. Not on any medications currently. Stopped [employer]. Encouraged to follow up as scheduled.

## 2023-05-29 NOTE — Assessment & Plan Note (Signed)
Chronic. Stable on Crestor 5 mg daily. Continue. Refills sent. Will check lipid panel today. The 10-year ASCVD risk score (Arnett DK, et al., 2019) is: 4.5%.

## 2023-05-29 NOTE — Assessment & Plan Note (Signed)
Currently on Trazodone 25 mg TID, will change to 50 mg at bedtime to help with sleep. Counseled on sleep hygiene. Will continue to monitor.

## 2023-05-29 NOTE — Assessment & Plan Note (Signed)
Chronic. Stable. BP soft at home and pulse 51 today, will decrease Lopressor to 25 mg BID. She will continue to check her blood pressure and pulse at home and keep a log to bring to her next appointment. Will continue to monitor. Return precautions given to patient.

## 2023-05-31 ENCOUNTER — Other Ambulatory Visit: Payer: Self-pay | Admitting: Nurse Practitioner

## 2023-05-31 ENCOUNTER — Encounter: Payer: Self-pay | Admitting: Nurse Practitioner

## 2023-05-31 DIAGNOSIS — F5104 Psychophysiologic insomnia: Secondary | ICD-10-CM

## 2023-05-31 MED ORDER — TRAZODONE HCL 50 MG PO TABS
25.0000 mg | ORAL_TABLET | Freq: Three times a day (TID) | ORAL | 3 refills | Status: DC
Start: 2023-05-31 — End: 2023-07-10

## 2023-06-01 ENCOUNTER — Ambulatory Visit (INDEPENDENT_AMBULATORY_CARE_PROVIDER_SITE_OTHER): Payer: PPO

## 2023-06-01 DIAGNOSIS — E538 Deficiency of other specified B group vitamins: Secondary | ICD-10-CM

## 2023-06-01 MED ORDER — CYANOCOBALAMIN 1000 MCG/ML IJ SOLN
1000.0000 ug | Freq: Once | INTRAMUSCULAR | Status: AC
Start: 2023-06-01 — End: 2023-06-01
  Administered 2023-06-01: 1000 ug via INTRAMUSCULAR

## 2023-06-01 NOTE — Progress Notes (Signed)
Patient presented for B 12 injection to left deltoid, patient voiced no concerns nor showed any signs of distress during injection. 

## 2023-06-08 ENCOUNTER — Ambulatory Visit: Payer: PPO

## 2023-06-08 DIAGNOSIS — E538 Deficiency of other specified B group vitamins: Secondary | ICD-10-CM

## 2023-06-08 MED ORDER — CYANOCOBALAMIN 1000 MCG/ML IJ SOLN
1000.0000 ug | Freq: Once | INTRAMUSCULAR | Status: AC
Start: 2023-06-08 — End: 2023-06-08
  Administered 2023-06-08: 1000 ug via INTRAMUSCULAR

## 2023-06-08 NOTE — Progress Notes (Signed)
Patient presented for B 12 injection to right deltoid, patient voiced no concerns nor showed any signs of distress during injection. 

## 2023-06-12 ENCOUNTER — Encounter: Payer: Self-pay | Admitting: Nurse Practitioner

## 2023-06-13 ENCOUNTER — Other Ambulatory Visit: Payer: Self-pay | Admitting: Nurse Practitioner

## 2023-06-13 DIAGNOSIS — F332 Major depressive disorder, recurrent severe without psychotic features: Secondary | ICD-10-CM

## 2023-06-13 DIAGNOSIS — F4312 Post-traumatic stress disorder, chronic: Secondary | ICD-10-CM

## 2023-06-14 ENCOUNTER — Encounter: Payer: Self-pay | Admitting: Nurse Practitioner

## 2023-06-15 ENCOUNTER — Ambulatory Visit: Payer: PPO

## 2023-06-20 ENCOUNTER — Other Ambulatory Visit: Payer: Self-pay | Admitting: Nurse Practitioner

## 2023-06-20 DIAGNOSIS — E538 Deficiency of other specified B group vitamins: Secondary | ICD-10-CM

## 2023-06-20 MED ORDER — CYANOCOBALAMIN 1000 MCG/ML IJ SOLN: 1000.0000 ug | INTRAMUSCULAR | 11 refills | Status: AC

## 2023-06-22 ENCOUNTER — Ambulatory Visit: Payer: PPO

## 2023-06-23 ENCOUNTER — Encounter: Payer: Self-pay | Admitting: Nurse Practitioner

## 2023-06-23 ENCOUNTER — Ambulatory Visit: Payer: PPO | Admitting: Nurse Practitioner

## 2023-06-23 VITALS — BP 130/70 | HR 49 | Temp 98.1°F | Ht 61.0 in | Wt 164.0 lb

## 2023-06-23 DIAGNOSIS — F419 Anxiety disorder, unspecified: Secondary | ICD-10-CM | POA: Diagnosis not present

## 2023-06-23 DIAGNOSIS — R001 Bradycardia, unspecified: Secondary | ICD-10-CM | POA: Diagnosis not present

## 2023-06-23 DIAGNOSIS — G478 Other sleep disorders: Secondary | ICD-10-CM | POA: Diagnosis not present

## 2023-06-23 DIAGNOSIS — F32A Depression, unspecified: Secondary | ICD-10-CM | POA: Diagnosis not present

## 2023-06-23 DIAGNOSIS — Z23 Encounter for immunization: Secondary | ICD-10-CM

## 2023-06-23 DIAGNOSIS — R300 Dysuria: Secondary | ICD-10-CM

## 2023-06-23 LAB — URINALYSIS, ROUTINE W REFLEX MICROSCOPIC
Bilirubin Urine: NEGATIVE
Ketones, ur: NEGATIVE
Leukocytes,Ua: NEGATIVE
Nitrite: NEGATIVE
Specific Gravity, Urine: 1.025 (ref 1.000–1.030)
Total Protein, Urine: NEGATIVE
Urine Glucose: NEGATIVE
Urobilinogen, UA: 0.2 (ref 0.0–1.0)
pH: 6 (ref 5.0–8.0)

## 2023-06-23 LAB — POC URINALSYSI DIPSTICK (AUTOMATED)
Bilirubin, UA: NEGATIVE
Glucose, UA: NEGATIVE
Ketones, UA: NEGATIVE
Leukocytes, UA: NEGATIVE
Nitrite, UA: NEGATIVE
Protein, UA: NEGATIVE
Spec Grav, UA: 1.02 (ref 1.010–1.025)
Urobilinogen, UA: 0.2 U/dL
pH, UA: 5.5 (ref 5.0–8.0)

## 2023-06-23 NOTE — Progress Notes (Signed)
Sharon Dicker, NP-C Phone: [phone]  Sharon [name] is a 67 y.o. female who presents today for follow up.   Anxiety/Depression- PHQ -6 and GAD- 6. Denies SI/HI. Doing well on Lexparo 20 mg daily, Abilify 5 mg at bedtime and Trazodone 25 mg TID. She has an appointment to establish with Behavioral Health on 07/10/2023.   UTI: Patient with symptoms x 2 weeks. Notes left sided flank and lower quadrant pain.   Dysuria- Yes  Frequency- Yes   Urgency- Yes   Hematuria- No   Fever- No  Abd pain- left sided flank and LLQ   Vaginal d/c- No  Bradycardia- Has not been evaluated by Cardiology. She does monitor her pulse at home. Reports usually 58-60 at home. Lopressor decreased from 50 BID to 25 BID last month.  Pulse Readings from Last 5 Encounters:  06/23/23 (!) 49  05/26/23 (!) 51  03/16/23 70  10/10/22 60  08/18/22 (!) 58   Sleep- Patient with difficulty falling asleep, does not occur every night. Once she is asleep she stays asleep. She does not feel that she gets restful sleep. She awakens in the morning tired. She does snore. She has never had a sleep study.   Social History   Tobacco Use  Smoking Status Former   Current packs/day: 0.00   Average packs/day: 0.3 packs/day for 10.0 years (3.3 ttl pk-yrs)   Types: Cigarettes   Start date: 11/15/2006   Quit date: 11/14/2016   Years since quitting: 6.6  Smokeless Tobacco Never  Tobacco Comments   Quit within past yr    Current Outpatient Medications on File Prior to Visit  Medication Sig Dispense Refill   acetaminophen (TYLENOL) 500 MG tablet Take 1,000-1,500 mg by mouth 2 (two) times daily as needed for moderate pain or headache.     ARIPiprazole (ABILIFY) 5 MG tablet Take 1 tablet (5 mg total) by mouth at bedtime. 90 tablet 3   aspirin EC 81 MG tablet Take 1 tablet (81 mg total) by mouth daily. Over the counter 30 tablet 3   cyanocobalamin (VITAMIN B12) 1000 MCG/ML injection Inject 1 mL (1,000 mcg total) into the muscle every  30 (thirty) days. 1 mL 11   diphenhydrAMINE (BENADRYL) 25 mg capsule Take 50 mg by mouth See admin instructions. Take 50mg  by mouth nightly in addition to 50mg  during the day as needed for allergies.     escitalopram (LEXAPRO) 20 MG tablet Take 1 tablet (20 mg total) by mouth daily. 90 tablet 3   Melatonin 10 MG CAPS Take 10 mg by mouth at bedtime as needed (Sleep).     metoprolol tartrate (LOPRESSOR) 25 MG tablet Take 1 tablet (25 mg total) by mouth 2 (two) times daily. 180 tablet 3   omeprazole (PRILOSEC) 20 MG capsule Take 20 mg by mouth daily.     rosuvastatin (CRESTOR) 5 MG tablet Take 1 tablet (5 mg total) by mouth at bedtime. 90 tablet 3   traZODone (DESYREL) 50 MG tablet Take 0.5 tablets (25 mg total) by mouth 3 (three) times daily. 135 tablet 3   No current facility-administered medications on file prior to visit.   ROS see history of present illness  Objective  Physical Exam Vitals:   06/23/23 0851  BP: 130/70  Pulse: (!) 49  Temp: 98.1 F (36.7 C)  SpO2: 95%    BP Readings from Last 3 Encounters:  06/23/23 130/70  05/26/23 110/66  03/16/23 129/79   Wt Readings from Last 3 Encounters:  06/23/23  164 lb (74.4 kg)  05/26/23 163 lb 9.6 oz (74.2 kg)  03/16/23 170 lb (77.1 kg)    Physical Exam Constitutional:      General: She is not in acute distress.    Appearance: Normal appearance.  HENT:     Head: Normocephalic.  Cardiovascular:     Rate and Rhythm: Regular rhythm. Bradycardia present.     Heart sounds: Normal heart sounds.  Pulmonary:     Effort: Pulmonary effort is normal.     Breath sounds: Normal breath sounds.  Abdominal:     General: Abdomen is flat. Bowel sounds are normal.     Palpations: Abdomen is soft.     Tenderness: There is abdominal tenderness in the suprapubic area and left lower quadrant.  Skin:    General: Skin is warm and dry.  Neurological:     General: No focal deficit present.     Mental Status: She is alert.  Psychiatric:         Mood and Affect: Mood normal.        Behavior: Behavior normal.    Assessment/Plan: Please see individual problem list.  Anxiety and depression Assessment & Plan: Chronic issue. Improvement in symptoms with Lexapro, Abilify and Trazodone. Continue. PHQ- 6 and GAD- 6 today. Scheduled to establish with Behavioral Health on November 11th. Encouraged to contact if worsening symptoms, unusual behavior changes or suicidal thoughts occur.    Dysuria Assessment & Plan: UA in office with trace lysed blood only. Microscopic and culture pending. Will contact patient with results. Encouraged adequate fluid intake. Further work up pending results.   Orders: -     POCT Urinalysis Dipstick (Automated) -     Urinalysis, Routine w reflex microscopic -     Urine Culture  Non-restorative sleep Assessment & Plan: Will refer to Pulmonology for sleep study.   Orders: -     Ambulatory referral to Pulmonology  Bradycardia Assessment & Plan: Monitors pulse at home. Normally in low 60s. Will decrease Lopressor to 12.5 mg BID. She will contact if remaining below 60. Will continue to monitor.    Need for influenza vaccination -     Flu Vaccine Trivalent High Dose (Fluad)    Return in about 6 months (around 12/22/2023) for Follow up.   Sharon Dicker, NP-C [facility] - [employer]

## 2023-06-25 LAB — URINE CULTURE
MICRO NUMBER:: 15644514
Result:: NO GROWTH
SPECIMEN QUALITY:: ADEQUATE

## 2023-06-27 ENCOUNTER — Telehealth: Payer: Self-pay

## 2023-06-27 ENCOUNTER — Other Ambulatory Visit: Payer: Self-pay

## 2023-06-27 NOTE — Telephone Encounter (Signed)
LVMTCB in regards to UA results

## 2023-06-27 NOTE — Telephone Encounter (Signed)
Pt called back and she was informed of results, she was asked if she would like to come back in to do another urine sample. Pt stated she will call back she has to call for a ride and once she has one set up she will call and schedule a lab appt just to give urine.

## 2023-06-27 NOTE — Telephone Encounter (Signed)
Patient states she is returning a call from Donavan Foil, CMA.  I transferred call to [facility].

## 2023-07-02 ENCOUNTER — Encounter: Payer: Self-pay | Admitting: Nurse Practitioner

## 2023-07-03 ENCOUNTER — Other Ambulatory Visit: Payer: Self-pay | Admitting: Nurse Practitioner

## 2023-07-03 DIAGNOSIS — I5181 Takotsubo syndrome: Secondary | ICD-10-CM

## 2023-07-03 DIAGNOSIS — R001 Bradycardia, unspecified: Secondary | ICD-10-CM

## 2023-07-03 NOTE — Assessment & Plan Note (Signed)
Monitors pulse at home. Normally in low 60s. Will decrease Lopressor to 12.5 mg BID. She will contact if remaining below 60. Will continue to monitor.

## 2023-07-03 NOTE — Assessment & Plan Note (Signed)
UA in office with trace lysed blood only. Microscopic and culture pending. Will contact patient with results. Encouraged adequate fluid intake. Further work up pending results.

## 2023-07-03 NOTE — Assessment & Plan Note (Signed)
Chronic issue. Improvement in symptoms with Lexapro, Abilify and Trazodone. Continue. PHQ- 6 and GAD- 6 today. Scheduled to establish with Behavioral Health on November 11th. Encouraged to contact if worsening symptoms, unusual behavior changes or suicidal thoughts occur.

## 2023-07-03 NOTE — Assessment & Plan Note (Signed)
Will refer to Pulmonology for sleep study.

## 2023-07-05 ENCOUNTER — Ambulatory Visit: Payer: PPO | Admitting: Gastroenterology

## 2023-07-05 ENCOUNTER — Encounter: Payer: Self-pay | Admitting: Gastroenterology

## 2023-07-05 ENCOUNTER — Telehealth: Payer: Self-pay

## 2023-07-05 VITALS — BP 137/79 | HR 58 | Temp 98.3°F | Ht 61.0 in | Wt 163.2 lb

## 2023-07-05 DIAGNOSIS — D510 Vitamin B12 deficiency anemia due to intrinsic factor deficiency: Secondary | ICD-10-CM | POA: Diagnosis not present

## 2023-07-05 DIAGNOSIS — K50012 Crohn's disease of small intestine with intestinal obstruction: Secondary | ICD-10-CM | POA: Diagnosis not present

## 2023-07-05 MED ORDER — BUDESONIDE 3 MG PO CPEP: 9.0000 mg | ORAL_CAPSULE | Freq: Every day | ORAL | 2 refills | Status: DC

## 2023-07-05 MED ORDER — SHINGRIX 50 MCG/0.5ML IM SUSR
0.5000 mL | Freq: Once | INTRAMUSCULAR | 0 refills | Status: AC
Start: 2023-07-05 — End: 2023-07-05

## 2023-07-05 NOTE — Telephone Encounter (Signed)
Filled out enrollment form for [facility] and faxed to [facility]. Emailed Sherrilyn Rist and [place] with [employer] to let them know it was coming there way. Also filled out and faxed the [facility] complete form for patient

## 2023-07-05 NOTE — Progress Notes (Signed)
Arlyss Repress, MD [address]  [address]  [place], [place] [address]  Main: [phone]  Fax: [phone]    Gastroenterology Consultation  Referring Provider:     Loyola Mast, MD Primary Care Physician:  Bethanie Dicker, NP Primary Gastroenterologist:  Dr. Arlyss Repress Reason for Consultation: Crohn's disease        HPI:   Sharon [name] is a 67 y.o. female referred by Bethanie Dicker, NP  for consultation & management of small bowel Crohn's, history of ileocolonic resection with primary anastomosis.  Patient was previously seen by [facility] gastroenterology, was on Entyvio monotherapy every 8 weeks.  She self discontinued Entyvio in June 2023 because she was worried about flareup of skin lesions as well as logistics in receiving the infusion which involved commuting to [place].  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  She has been noticing flareup of her GI symptoms including diarrhea, rectal bleeding, urgency and some weight loss, loss of appetite.  Follow-up visit 07/05/2023 Sharon [name] is here for follow-up of Crohn's disease.  She has originally seen me on 08/18/2022 to establish care for Crohn's disease.  Subsequently, she underwent endoscopy which was unremarkable, colonoscopy revealed inflammation at the ileocolonic anastomosis, which was chronic moderate active enterocolitis.  Neoterminal ileum could not be traversed.  Therefore, underwent MR enterography which did not reveal any active small bowel Crohn's and neoterminal ileum.  Her fecal calprotectin levels were normal.  I have empirically treated her for possible bacterial overgrowth with 2 weeks course of Xifaxan and patient cannot recall if this has really helped with her symptoms.  She did not follow-up after workup.  She reports that since September, her symptoms have worsened including abdominal pain, several episodes of nonbloody diarrhea, symptoms at night as well associated with nausea, loss of appetite.   She reports that she was on Entocort in the past which helped with her symptoms.  Crohn's disease classification:  Age: > 40 Location: Ileal or colonic or ileocolonic or only upper GI Behavior: non stricturing, non penetrating or stricturing or penetrating  Perianal: Yes or no  IBD diagnosis: Year 2000 which presented with small bowel obstruction  Disease course:  Crohn's ileitis diagnosed in 2000, stricturing phenotype and for which she has undergone ileal resection in 2000.  In 2016 she had further episodes of obstruction and she underwent a resection in February 2016. She clearly improved after surgery. She has failed Certolizumab (primary non-responder), Infliximab (blisters). She was taking 6-MP in combination with Vedolizumab for some time after her 2016 surgery but this was discontinued.  Patient was initially followed by Dr. Chesley Mires at [facility] until 2020 when she switched her care to [facility].  Sharon [name] started Vedolizumab monotherapy in 2020.  This has resulted in clinical remission. In 01/2019, MRE revealed no active Crohn's and hepatic steatosis. In 04/2019, colonoscopy revealed moderate (i2) inflammation in neo-TI. Anastomosis in [facility] noted. Normal colon.   Extra intestinal manifestations: None  IBD surgical history: Ileocolonic resection in a year 2000  Imaging:  MRE 02/15/2019 No evidence of active inflammatory bowel disease, diffuse hepatic steatosis MRE 10/25/22 IMPRESSION: 1. Surgical changes from prior small bowel resection and ileal-colonic anastomosis. No MR findings suspicious for active/recurrent Crohn's disease. 2. No inflammatory changes or obstructive findings. 3. No abdominal or pelvic mass or adenopathy. 4. Examination is limited by respiratory motion. CT is probably a better option for this patient if future imaging is indicated.  CTE 11/14/2021 No CT evidence of active disease, diffuse hepatic steatosis SBFT none  Procedures: Upper  endoscopy 10/10/2022 - Normal duodenal bulb and second portion of the duodenum. Biopsied. - Erythematous mucosa in the antrum and prepyloric region of the stomach. Biopsied. - Normal gastric body and incisura. Biopsied. - Esophagogastric landmarks identified. - Normal gastroesophageal junction and esophagus.  Colonoscopy 10/10/2022 - Hemorrhoids found on perianal exam. - Patent end- to- side ileo- colonic anastomosis, characterized by ulceration. Biopsied. - Normal mucosa in the left colon and in the right colon. Biopsied. - Non- bleeding external and internal hemorrhoids. DIAGNOSIS: A.  DUODENUM, RANDOM; COLD BIOPSY: - DUODENAL MUCOSA WITH FOCAL PROMINENT BRUNNER'S GLANDS, NON-SPECIFIC. - NEGATIVE FOR GRANULOMAS, DYSPLASIA, AND MALIGNANCY.  B.  STOMACH, RANDOM; COLD BIOPSY: - ANTRAL MUCOSA WITH MODERATE CHRONIC ACTIVE GASTRITIS. - OXYNTIC MUCOSA WITH FOCAL REACTIVE GASTRITIS. - IHC FOR H. PYLORI IS NEGATIVE - NEGATIVE FOR GRANULOMAS, INTESTINAL METAPLASIA, DYSPLASIA, AND MALIGNANCY. - SEE COMMENT.  C.  ILEOCOLONIC ANASTOMOSIS, RANDOM; COLD BIOPSY: - FOCAL MODERATE CHRONIC ACTIVE ENTEROCOLITIS CONSISTENT WITH ANASTOMOTIC SITE. - IHC FOR CMV IS NEGATIVE. - NEGATIVE FOR GRANULOMAS, DYSPLASIA, AND MALIGNANCY.  D.  COLON, RIGHT RANDOM; COLD BIOPSY: - UNREMARKABLE COLONIC MUCOSA. - NEGATIVE FOR GRANULOMAS, DYSPLASIA, AND MALIGNANCY.  E.  COLON, LEFT RANDOM; COLD BIOPSY: - SMALL TUBULAR ADENOMA (ONE FRAGMENT). - UNREMARKABLE COLONIC MUCOSA (MULTIPLE FRAGMENTS). - NEGATIVE FOR GRANULOMAS, HIGH-GRADE DYSPLASIA AND MALIGNANCY.   Colonoscopy 04/2019, revealed moderate (i2) inflammation in neo-TI. Anastomosis in [facility] noted. Normal colon. Impression: - Perianal skin tags found on perianal exam. - Crohn's disease with ileitis. Biopsied. - Patent side-to-side ileo-colonic anastomosis,  characterized by healthy appearing mucosa. - The examination was otherwise normal on direct and  retroflexion  views. - Biopsies for surveillance were taken from the entire  colon.  A.  Ileum, endoscopic biopsy: Small intestinal mucosa with no significant pathologic alteration. No active inflammation, chronic colitis, or granuloma formation is seen. Negative for dysplasia.   B.  Right colon, endoscopic biopsy: Colonic mucosa with no significant pathologic alteration. No active inflammation, chronic colitis, or granuloma formation is seen. Negative for dysplasia.   C.  Transverse colon, endoscopic biopsy: Colonic mucosa with no significant pathologic alteration. No active inflammation, chronic colitis, or granuloma formation is seen. Negative for dysplasia.   D.  Left colon, endoscopic biopsy: Colonic mucosa with no significant pathologic alteration. No active inflammation, chronic colitis, or granuloma formation is seen. Negative for dysplasia.  Colonoscopy at [facility] 10/07/2015 NEOTERMINAL ILEUM, BIOPSIES:       Enteric mucosa with no significant diagnostic abnormality.       No active inflammation, granulomas or dysplasia.   Upper Endoscopy none  VCE none  IBD medications:  Steroids: Prednisone, budesonide 5-ASA: None  Immunomodulators: Received Imuran, methotrexate none TPMT status unknown Biologics:  Anti TNFs: Received Cimzia, primary nonresponder.  Remicade resulted in skin blisters Anti Integrins: Entyvio initiated in 2016 after second resection, later discontinued.  Restarted in 2020 as monotherapy every 8 weeks, self discontinued in 01/2022, Ustekinumab: Tofactinib: Clinical trial:   NSAIDs: None  Antiplts/Anticoagulants/Anti thrombotics: None   Past Medical History:  Diagnosis Date   Anxiety and depression    Arthritis    B12 deficiency    pernicious anemia - monthly b12 shots   Cervical cancer (HCC) 12/2008   s/p hysterectomy   Crohn's disease (HCC) 2000   h/o stenotic crohn's ileitis, active in TI s/p resections 2000, 2016 PPD neg ([facility] Dr.  Randa Evens); has been  on cimzia, remicade, , entocort, now stable on Entyvio ([place] at [facility])   Genital warts    GERD (gastroesophageal reflux disease)    severe, daily sxs if off PPI   Hepatic steatosis 04/2011   diffuse on CT, 5mm gallbladder polyp   History of anemia    attributed to crohn's   History of chicken pox    History of syphilis 1980s   HTN (hypertension)    Migraines    and frequent other headaches (sinus or stress)   Scalp psoriasis    Sensorineural hearing loss of both ears 12/2012   high freq   Takotsubo syndrome 07/2012   cath 08/01/12, normal coronaries, LVEF 65%   TIA (transient ischemic attack) 05/2010   at [facility] - TIA vs complex migraine (w/u negative - carotids, echo, TC doppler, and MRI normal)   Tobacco abuse     Past Surgical History:  Procedure Laterality Date   APPENDECTOMY  1998   CARDIAC CATHETERIZATION  07/2012   normal LV fxn, widely patent coronaries   CERVICAL BIOPSY  W/ LOOP ELECTRODE EXCISION  10/2008   COLON RESECTION  10/2014   removal of scar tissue colon from prior surgery ([place] [facility])   COLONOSCOPY  10/2008   ileo-colonic anastomosis, ielocolonic crohn's   COLONOSCOPY  05/2011   ileo-colonic anastomosis normal, normal mucosa   COLONOSCOPY  10/15/2012   focal inflammation at anastomosis, no active crohn's, planning MR enterograph Randa Evens)   COLONOSCOPY  09/2014   [place]   COLONOSCOPY WITH PROPOFOL N/A 10/10/2022   Procedure: COLONOSCOPY WITH PROPOFOL;  Surgeon: Toney Reil, MD;  Location: [facility] ENDOSCOPY;  Service: Gastroenterology;  Laterality: N/A;   ESOPHAGOGASTRODUODENOSCOPY  05/2011   nl esophagus, gastritis, nl duodenum   ESOPHAGOGASTRODUODENOSCOPY (EGD) WITH PROPOFOL N/A 10/10/2022   Procedure: ESOPHAGOGASTRODUODENOSCOPY (EGD) WITH PROPOFOL;  Surgeon: Toney Reil, MD;  Location: [facility] ENDOSCOPY;  Service: Gastroenterology;  Laterality: N/A;   KNEE SURGERY Left    LEFT HEART CATH AND CORONARY ANGIOGRAPHY  Left 04/15/2020   Procedure: LEFT HEART CATH AND CORONARY ANGIOGRAPHY;  Surgeon: Alwyn Pea, MD;  Location: [facility] INVASIVE CV LAB;  Service: Cardiovascular;  Laterality: Left;   LEFT HEART CATHETERIZATION WITH CORONARY ANGIOGRAM N/A 08/01/2012   Procedure: LEFT HEART CATHETERIZATION WITH CORONARY ANGIOGRAM;  Surgeon: Tonny Bollman, MD;  Location: [facility] CATH LAB;  Service: Cardiovascular;  Laterality: N/A;   RIGHT COLECTOMY  2000   crohn's disease, ileocecal resection   SHOULDER ARTHROSCOPY W/ ROTATOR CUFF REPAIR Right 05/31/2012   [facility])   US ECHOCARDIOGRAPHY  05/2010   nl LV fxn ,EF 60%   VAGINAL HYSTERECTOMY  12/2008   [facility]/BSO     Current Outpatient Medications:    acetaminophen (TYLENOL) 500 MG tablet, Take 1,000-1,500 mg by mouth 2 (two) times daily as needed for moderate pain or headache., Disp: , Rfl:    ARIPiprazole (ABILIFY) 5 MG tablet, Take 1 tablet (5 mg total) by mouth at bedtime., Disp: 90 tablet, Rfl: 3   aspirin EC 81 MG tablet, Take 1 tablet (81 mg total) by mouth daily. Over the counter, Disp: 30 tablet, Rfl: 3   cyanocobalamin (VITAMIN B12) 1000 MCG/ML injection, Inject 1 mL (1,000 mcg total) into the muscle every 30 (thirty) days., Disp: 1 mL, Rfl: 11   diphenhydrAMINE (BENADRYL) 25 mg capsule, Take 50 mg by mouth See admin instructions. Take 50mg  by mouth nightly in addition to 50mg  during the day as needed for allergies., Disp: , Rfl:  escitalopram (LEXAPRO) 20 MG tablet, Take 1 tablet (20 mg total) by mouth daily., Disp: 90 tablet, Rfl: 3   Melatonin 10 MG CAPS, Take 10 mg by mouth at bedtime as needed (Sleep)., Disp: , Rfl:    metoprolol tartrate (LOPRESSOR) 25 MG tablet, Take 1 tablet (25 mg total) by mouth 2 (two) times daily., Disp: 180 tablet, Rfl: 3   omeprazole (PRILOSEC) 20 MG capsule, Take 20 mg by mouth daily., Disp: , Rfl:    oxycodone (OXY-IR) 5 MG capsule, Take 5 mg by mouth every 6 (six) hours., Disp: , Rfl:    rosuvastatin  (CRESTOR) 5 MG tablet, Take 1 tablet (5 mg total) by mouth at bedtime., Disp: 90 tablet, Rfl: 3   traZODone (DESYREL) 50 MG tablet, Take 0.5 tablets (25 mg total) by mouth 3 (three) times daily., Disp: 135 tablet, Rfl: 3   Family History  Problem Relation Age of Onset   Stroke Mother    Hypertension Mother    Hyperlipidemia Mother    Hypertension Father    Crohn's disease Father    Cancer Sister        ovarian   Crohn's disease Sister    Crohn's disease Daughter    Ulcerative colitis Daughter    Cancer Paternal Aunt        Brain   Crohn's disease Paternal Aunt    Crohn's disease Paternal Uncle    Stroke Paternal Grandmother    Diabetes Neg Hx      Social History   Tobacco Use   Smoking status: Former    Current packs/day: 0.00    Average packs/day: 0.3 packs/day for 10.0 years (3.3 ttl pk-yrs)    Types: Cigarettes    Start date: 11/15/2006    Quit date: 11/14/2016    Years since quitting: 6.6   Smokeless tobacco: Never   Tobacco comments:    Quit within past yr  Vaping Use   Vaping status: Never Used  Substance Use Topics   Alcohol use: Yes    Comment: occ   Drug use: No    Comment: extensive drug use, remotely    Allergies as of 07/05/2023 - Review Complete 07/05/2023  Allergen Reaction Noted   Infliximab Dermatitis 12/10/2015   Codeine Nausea And Vomiting 05/16/2011   Flagyl [metronidazole] Nausea And Vomiting 07/15/2014   Hydrocodone Nausea And Vomiting 11/13/2012   Ibuprofen Other (See Comments) 05/23/2012   Remeron [mirtazapine] Other (See Comments) 10/13/2011    Review of Systems:    All systems reviewed and negative except where noted in HPI.   Physical Exam:  BP 137/79 (BP Location: Left Arm, Patient Position: Sitting, Cuff Size: Normal)   Pulse (!) 58   Temp 98.3 F (36.8 C) (Oral)   Ht 5\' 1"  (1.549 m)   Wt 163 lb 4 oz (74 kg)   BMI 30.85 kg/m  No LMP recorded. Patient has had a hysterectomy.  General:   Alert,  Well-developed,  well-nourished, pleasant and cooperative in NAD Head:  Normocephalic and atraumatic. Eyes:  Sclera clear, no icterus.   Conjunctiva pink. Ears:  Normal auditory acuity. Nose:  No deformity, discharge, or lesions. Mouth:  No deformity or lesions,oropharynx pink & moist. Neck:  Supple; no masses or thyromegaly. Lungs:  Respirations even and unlabored.  Clear throughout to auscultation.   No wheezes, crackles, or rhonchi. No acute distress. Heart:  Regular rate and rhythm; no murmurs, clicks, rubs, or gallops. Abdomen:  Normal bowel sounds. Soft, non-tender and non-distended without masses,  hepatosplenomegaly or hernias noted.  No guarding or rebound tenderness.   Rectal: Not performed Msk:  Symmetrical without gross deformities. Good, equal movement & strength bilaterally. Pulses:  Normal pulses noted. Extremities:  No clubbing or edema.  No cyanosis. Neurologic:  Alert and oriented x3;  grossly normal neurologically. Skin:  Intact without significant lesions or rashes. No jaundice. Psych:  Alert and cooperative. Normal mood and affect.  Imaging Studies: Reviewed  Assessment and Plan:   Sharon [name] is a 67 y.o. female with history of small bowel Crohn's with obstruction, s/p ileocecal resection in 2000, recurrent obstruction, status post second resection in 2016, previously treated with Cimzia, Remicade, Imuran and most recently Entyvio.  Patient self discontinued Entyvio in June 2023 as she was concerned about skin lesions.  Postop recurrence of small bowel Crohn's: fecal calprotectin levels are normal MR enterography did not reveal active inflammation in the neoterminal ileum Empirically treated with Xifaxan for bacterial overgrowth With ongoing symptoms and patient not being on biologic therapy, today I have discussed with her regarding anti-TNF sparing Biologics including antiinterleukin therapies.  I do not recommend upadacitinib because of bradycardia and patient states that she  will be seeing cardiologist for further evaluation of bradycardia Will apply for risankizumab, anti-IL 23 agent  B12 deficiency anemia, recommend B12 injection once a week for 4 weeks followed by once a month   Follow up in 3 to 4 months   Arlyss Repress, MD

## 2023-07-10 ENCOUNTER — Ambulatory Visit (HOSPITAL_BASED_OUTPATIENT_CLINIC_OR_DEPARTMENT_OTHER): Payer: PPO | Admitting: Family

## 2023-07-10 ENCOUNTER — Encounter (HOSPITAL_COMMUNITY): Payer: Self-pay | Admitting: Family

## 2023-07-10 VITALS — BP 153/73 | HR 54 | Ht 61.0 in | Wt 163.0 lb

## 2023-07-10 DIAGNOSIS — F419 Anxiety disorder, unspecified: Secondary | ICD-10-CM

## 2023-07-10 DIAGNOSIS — F32A Depression, unspecified: Secondary | ICD-10-CM

## 2023-07-10 DIAGNOSIS — F332 Major depressive disorder, recurrent severe without psychotic features: Secondary | ICD-10-CM | POA: Diagnosis not present

## 2023-07-10 MED ORDER — MIRTAZAPINE 7.5 MG PO TABS
7.5000 mg | ORAL_TABLET | Freq: Every day | ORAL | 0 refills | Status: DC
Start: 2023-07-10 — End: 2024-02-19
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
## 2023-07-13 ENCOUNTER — Telehealth: Payer: Self-pay

## 2023-07-13 NOTE — Telephone Encounter (Signed)
Submitted PA through cover my meds for the [employer] on response from [employer]

## 2023-07-13 NOTE — Telephone Encounter (Signed)
Submitted the PA through cover my meds for the [employer] IV infusion. Waiting on response from insurance company

## 2023-07-14 ENCOUNTER — Telehealth (HOSPITAL_COMMUNITY): Payer: PPO | Admitting: Family

## 2023-07-14 DIAGNOSIS — G479 Sleep disorder, unspecified: Secondary | ICD-10-CM | POA: Diagnosis not present

## 2023-07-14 MED ORDER — RAMELTEON 8 MG PO TABS: 8.0000 mg | ORAL_TABLET | Freq: Every day | ORAL | 0 refills | Status: DC

## 2023-07-14 NOTE — Progress Notes (Signed)
Virtual Visit via Video Note  I connected with Sharon [name] on 07/14/23 at 12:00 PM EST by a video enabled telemedicine application and verified that I am speaking with the correct person using two identifiers.  Location: Patient: Work Provider: Office   I discussed the limitations of evaluation and management by telemedicine and the availability of in person appointments. The patient expressed understanding and agreed to proceed.  I discussed the assessment and treatment plan with the patient. The patient was provided an opportunity to ask questions and all were answered. The patient agreed with the plan and demonstrated an understanding of the instructions.   The patient was advised to call back or seek an in-person evaluation if the symptoms worsen or if the condition fails to improve as anticipated.  I provided 15 minutes of non-face-to-face time during this encounter.   Sharon Rack, NP   [facility] MD/PA/NP OP Progress Note  07/14/2023 11:55 AM Sharon [name]  MRN:  [id number]  Chief Complaint: No chief complaint on file.  HPI: Sharon [name] 67 year old female presents for medication management follow-up appointment.  Per previous visit patient was prescribed Remeron 15 mg nightly which she has been taking for the past 3 nights.  She reports lingering hangover effect the next morning and shakiness.  States she continues to wake up 2-3 times a night.  Patient was discontinued from trazodone p.o. 3 times daily as prescribed by her primary care provider.  States she is interested in in the medication to help with her sleep disturbance.  Discussed initiating ramelteon 8 mg nightly.  Patient to follow-up 1 week for medication adherence/tolerability.  No concerns with suicidal or homicidal ideations.  Patient to continue Lexapro 20 mg daily and Abilify 5 mg.  Sleep disturbance: PCP prescribed trazodone 50 mg p.o. 3 times daily-reports feeling lethargic, too sedating -Discontinue  Remeron 15 mg nightly patient reports " hangover effect" -Initiate ramelteon 8 mg nightly  Patient to follow-up 1 week for medication adherence/tolerability.   Visit Diagnosis:    ICD-10-CM   1. Sleep disturbance  G47.9       Past Psychiatric History: Major depressive disorder, generalized anxiety disorder  Past Medical History:  Past Medical History:  Diagnosis Date   Anxiety and depression    Arthritis    B12 deficiency    pernicious anemia - monthly b12 shots   Cervical cancer (HCC) 12/2008   s/p hysterectomy   Crohn's disease (HCC) 2000   h/o stenotic crohn's ileitis, active in TI s/p resections 2000, 2016 PPD neg ([facility] Dr. Randa Evens); has been on cimzia, remicade, , entocort, now stable on Entyvio ([place] [facility])   Genital warts    GERD (gastroesophageal reflux disease)    severe, daily sxs if off PPI   Hepatic steatosis 04/2011   diffuse on CT, 5mm gallbladder polyp   History of anemia    attributed to crohn's   History of chicken pox    History of syphilis 1980s   HTN (hypertension)    Migraines    and frequent other headaches (sinus or stress)   Scalp psoriasis    Sensorineural hearing loss of both ears 12/2012   high freq   Takotsubo syndrome 07/2012   cath 08/01/12, normal coronaries, LVEF 65%   TIA (transient ischemic attack) 05/2010   at [facility] - TIA vs complex migraine (w/u negative - carotids, echo, TC doppler, and MRI normal)   Tobacco abuse     Past Surgical History:  Procedure Laterality  Date   APPENDECTOMY  1998   CARDIAC CATHETERIZATION  07/2012   normal LV fxn, widely patent coronaries   CERVICAL BIOPSY  W/ LOOP ELECTRODE EXCISION  10/2008   COLON RESECTION  10/2014   removal of scar tissue colon from prior surgery ([facility])   COLONOSCOPY  10/2008   ileo-colonic anastomosis, ielocolonic crohn's   COLONOSCOPY  05/2011   ileo-colonic anastomosis normal, normal mucosa   COLONOSCOPY  10/15/2012   focal inflammation at anastomosis,  no active crohn's, planning MR enterograph Randa Evens)   COLONOSCOPY  09/2014   [place]   COLONOSCOPY WITH PROPOFOL N/A 10/10/2022   Procedure: COLONOSCOPY WITH PROPOFOL;  Surgeon: Toney Reil, MD;  Location: [facility] ENDOSCOPY;  Service: Gastroenterology;  Laterality: N/A;   ESOPHAGOGASTRODUODENOSCOPY  05/2011   nl esophagus, gastritis, nl duodenum   ESOPHAGOGASTRODUODENOSCOPY (EGD) WITH PROPOFOL N/A 10/10/2022   Procedure: ESOPHAGOGASTRODUODENOSCOPY (EGD) WITH PROPOFOL;  Surgeon: Toney Reil, MD;  Location: [facility] ENDOSCOPY;  Service: Gastroenterology;  Laterality: N/A;   KNEE SURGERY Left    LEFT HEART CATH AND CORONARY ANGIOGRAPHY Left 04/15/2020   Procedure: LEFT HEART CATH AND CORONARY ANGIOGRAPHY;  Surgeon: Alwyn Pea, MD;  Location: [facility] INVASIVE CV LAB;  Service: Cardiovascular;  Laterality: Left;   LEFT HEART CATHETERIZATION WITH CORONARY ANGIOGRAM N/A 08/01/2012   Procedure: LEFT HEART CATHETERIZATION WITH CORONARY ANGIOGRAM;  Surgeon: Tonny Bollman, MD;  Location: [facility] CATH LAB;  Service: Cardiovascular;  Laterality: N/A;   RIGHT COLECTOMY  2000   crohn's disease, ileocecal resection   SHOULDER ARTHROSCOPY W/ ROTATOR CUFF REPAIR Right 05/31/2012   [facility])   US ECHOCARDIOGRAPHY  05/2010   nl LV fxn ,EF 60%   VAGINAL HYSTERECTOMY  12/2008   LAVH/BSO    Family Psychiatric History:   Family History:  Family History  Problem Relation Age of Onset   Stroke Mother    Hypertension Mother    Hyperlipidemia Mother    Hypertension Father    Crohn's disease Father    Cancer Sister        ovarian   Crohn's disease Sister    Crohn's disease Daughter    Ulcerative colitis Daughter    Cancer Paternal Aunt        Brain   Crohn's disease Paternal Aunt    Crohn's disease Paternal Uncle    Stroke Paternal Grandmother    Diabetes Neg Hx     Social History:  Social History   Socioeconomic History   Marital status: Divorced    Spouse name:  Not on file   Number of children: 3   Years of education: Not on file   Highest education level: Bachelor's degree (e.g., BA, AB, BS)  Occupational History   Occupation: Nursing Tech    Comment: [employer]  Tobacco Use   Smoking status: Former    Current packs/day: 0.00    Average packs/day: 0.3 packs/day for 10.0 years (3.3 ttl pk-yrs)    Types: Cigarettes    Start date: 11/15/2006    Quit date: 11/14/2016    Years since quitting: 6.6   Smokeless tobacco: Never   Tobacco comments:    Quit within past yr  Vaping Use   Vaping status: Never Used  Substance and Sexual Activity   Alcohol use: Yes    Comment: occ   Drug use: No    Comment: extensive drug use, remotely   Sexual activity: Not Currently    Birth control/protection: Surgical, Post-menopausal  Other Topics Concern  Not on file  Social History Narrative   Not on file   Social Determinants of Health   Financial Resource Strain: Not on file  Food Insecurity: Not on file  Transportation Needs: Not on file  Physical Activity: Unknown (02/12/2018)   Exercise Vital Sign    Days of Exercise per Week: 5 days    Minutes of Exercise per Session: Not on file  Stress: No Stress Concern Present (02/12/2018)   [employer] of Occupational Health - Occupational Stress Questionnaire    Feeling of Stress : Only a little  Social Connections: Not on file    Allergies:  Allergies  Allergen Reactions   Infliximab Dermatitis   Codeine Nausea And Vomiting   Flagyl [Metronidazole] Nausea And Vomiting   Hydrocodone Nausea And Vomiting   Ibuprofen Other (See Comments)    Pt states that she is unable to take due to her Crohn's disease.   Remeron [Mirtazapine] Other (See Comments)    Reaction:  Makes pt lethargic     Metabolic Disorder Labs: Lab Results  Component Value Date   HGBA1C 5.5 05/26/2023   MPG 108 08/01/2012   MPG 103 06/09/2010   No results found for: "PROLACTIN" Lab Results  Component Value Date   CHOL 136  05/26/2023   TRIG 86.0 05/26/2023   HDL 54.60 05/26/2023   CHOLHDL 2 05/26/2023   VLDL 17.2 05/26/2023   LDLCALC 64 05/26/2023   LDLCALC 83 01/05/2018   Lab Results  Component Value Date   TSH 1.64 05/26/2023   TSH 3.899 08/01/2012    Therapeutic Level Labs: No results found for: "LITHIUM" No results found for: "VALPROATE" No results found for: "CBMZ"  Current Medications: Current Outpatient Medications  Medication Sig Dispense Refill   ramelteon (ROZEREM) 8 MG tablet Take 1 tablet (8 mg total) by mouth at bedtime. 10 tablet 0   acetaminophen (TYLENOL) 500 MG tablet Take 1,000-1,500 mg by mouth 2 (two) times daily as needed for moderate pain or headache.     ARIPiprazole (ABILIFY) 5 MG tablet Take 1 tablet (5 mg total) by mouth at bedtime. 90 tablet 3   aspirin EC 81 MG tablet Take 1 tablet (81 mg total) by mouth daily. Over the counter 30 tablet 3   budesonide (ENTOCORT EC) 3 MG 24 hr capsule Take 3 capsules (9 mg total) by mouth daily. 90 capsule 2   cyanocobalamin (VITAMIN B12) 1000 MCG/ML injection Inject 1 mL (1,000 mcg total) into the muscle every 30 (thirty) days. 1 mL 11   escitalopram (LEXAPRO) 20 MG tablet Take 1 tablet (20 mg total) by mouth daily. 90 tablet 3   Melatonin 10 MG CAPS Take 10 mg by mouth at bedtime as needed (Sleep).     metoprolol tartrate (LOPRESSOR) 25 MG tablet Take 1 tablet (25 mg total) by mouth 2 (two) times daily. 180 tablet 3   mirtazapine (REMERON) 7.5 MG tablet Take 1 tablet (7.5 mg total) by mouth at bedtime. 30 tablet 0   omeprazole (PRILOSEC) 20 MG capsule Take 20 mg by mouth daily.     oxycodone (OXY-IR) 5 MG capsule Take 5 mg by mouth every 6 (six) hours.     rosuvastatin (CRESTOR) 5 MG tablet Take 1 tablet (5 mg total) by mouth at bedtime. 90 tablet 3   No current facility-administered medications for this visit.     Musculoskeletal: Telehealth assessment  Psychiatric Specialty Exam: Review of Systems  Constitutional: Negative.    Psychiatric/Behavioral:  Positive for sleep disturbance.  All other systems reviewed and are negative.   There were no vitals taken for this visit.There is no height or weight on file to calculate BMI.  General Appearance: Casual  Eye Contact:  Good  Speech:  Clear and Coherent  Volume:  Normal  Mood:  Anxious and Depressed  Affect:  Congruent  Thought Process:  Coherent  Orientation:  Full (Time, Place, and Person)  Thought Content: Logical   Suicidal Thoughts:  No  Homicidal Thoughts:  No  Memory:  Immediate;   Good Recent;   Good  Judgement:  Good  Insight:  Good  Psychomotor Activity:  Normal  Concentration:  Concentration: Good  Recall:  Good  Fund of Knowledge: Good  Language: Good  Akathisia:  No  Handed:  Right  AIMS (if indicated): done  Assets:  Communication Skills Resilience Social Support  ADL's:  Intact  Cognition: WNL  Sleep:  Poor   Screenings: GAD-7    Garment/textile technologist Visit from 06/23/2023 in [facility] [employer] Visit from 05/26/2023 in [facility] [employer] Visit from 07/19/2022 in [facility] [employer] Visit from 03/27/2018 in [facility] [place] Office Visit from 01/24/2018 in [facility]  Total GAD-7 Score 6 12 4 8 12       PHQ2-9    Flowsheet Row Office Visit from 07/10/2023 in [facility] Office Visit from 06/23/2023 in [facility] [employer] Visit from 05[facility]26/2023 in [facility] [employer] Visit from 07/19/2022 in [facility] [employer] Visit from 03/27/2018 in [facility] [place]  PHQ-2 Total Score 2 0 3 2 2   PHQ-9 Total Score 8 6 17 6 11       Flowsheet Row ED from 03/16/2023 in [facility]  Emergency Department at [facility] Admission (Discharged) from 10/10/2022 in [facility] ED from 06/10/2022 in [facility] Emergency Department at [facility]  C-SSRS RISK CATEGORY High Risk No Risk No Risk        Assessment and Plan: Sharon [name] presented for follow-up for medication management appointment.  Currently reporting symptoms are related to disturbed sleep.  States she is waking up multiple times throughout the night.  Patient has tried multiple medications in the past.  Initiated on Remeron at initial visit however she reports hangover effect with medication and some shakiness throughout the day.  Discussed discontinuing will initiate Ramelteon 8 mg nightly. Patient to f/u 1 week.  Sleep disturbance: PCP prescribed trazodone 50 mg p.o. 3 times daily-reports feeling lethargic, too sedating -Discontinue Remeron 15 mg nightly patient reports " hangover effect" -Initiate ramelteon 8 mg nightly  Collaboration of Care: Collaboration of Care: Medication Management AEB \\Start  Ramelton 8 mg  Patient/Guardian was advised Release of Information must be obtained prior to any record release in order to collaborate their care with an outside provider. Patient/Guardian was advised if they have not already done so to contact the registration department to sign all necessary forms in order for [place] to release information regarding their care.   Consent: Patient/Guardian gives verbal consent for treatment and assignment of benefits for services provided during this visit. Patient/Guardian expressed understanding and agreed to proceed.    Sharon Rack, NP 07/14/2023, 11:55 AM

## 2023-07-17 NOTE — Telephone Encounter (Signed)
Per [name] with [facility] Sharon [name] is approved. will let you know once quote complete and shes scheduled!

## 2023-07-19 NOTE — Telephone Encounter (Signed)
Per Sherrilyn Rist with [facility] Avamae Gagliano is scheduled for 11/26 for [facility].

## 2023-07-21 ENCOUNTER — Telehealth (HOSPITAL_COMMUNITY): Payer: PPO | Admitting: Family

## 2023-07-21 ENCOUNTER — Encounter (HOSPITAL_COMMUNITY): Payer: Self-pay

## 2023-07-24 ENCOUNTER — Telehealth (HOSPITAL_COMMUNITY): Payer: PPO | Admitting: Family

## 2023-07-24 DIAGNOSIS — F332 Major depressive disorder, recurrent severe without psychotic features: Secondary | ICD-10-CM

## 2023-07-24 DIAGNOSIS — G479 Sleep disorder, unspecified: Secondary | ICD-10-CM | POA: Diagnosis not present

## 2023-07-24 DIAGNOSIS — F419 Anxiety disorder, unspecified: Secondary | ICD-10-CM

## 2023-07-24 NOTE — Progress Notes (Unsigned)
Virtual Visit via Video Note  I connected with Sharon [name] on 07/24/23 at  1:00 PM EST by a video enabled telemedicine application and verified that I am speaking with the correct person using two identifiers.  Location: Patient: Home Provider: Office   I discussed the limitations of evaluation and management by telemedicine and the availability of in person appointments. The patient expressed understanding and agreed to proceed.   I discussed the assessment and treatment plan with the patient. The patient was provided an opportunity to ask questions and all were answered. The patient agreed with the plan and demonstrated an understanding of the instructions.   The patient was advised to call back or seek an in-person evaluation if the symptoms worsen or if the condition fails to improve as anticipated.  I provided 15 minutes of non-face-to-face time during this encounter.   [name], NP   [facility] MD/PA/NP OP Progress Note  07/24/2023 1:17 PM KELE HRABOVSKY  MRN:  [id number]  Chief Complaint: "I am doing okay"  Evaluation: Sharon [name] 67 year old who presents for medication management appointment.  She was seen and evaluated via video teleassessment.  Patient was recently discontinued from trazodone and initiated on Remeron.  She reports she has been taking and tolerating medications " fairly" reports getting pretty good rest with medication however,  continues to have a lingering sedation effect in the morning.  Discussed taking medication earlier and/or intermittently.  She was receptive to plan.  States overall her mood has improved.   Major depressive disorder: Generalized anxiety disorder: Sleep disturbance:  Continue Lexapro 20 mg daily Continue Abilify 5 mg daily Continue Remeron 15 mg nightly as needed (discussed taking medication earlier due to the lingering early morning sedation.)   Sharon [name] observed sitting outside, house siding noted in  background she is alert/oriented x 4; calm/cooperative; and mood congruent with affect.  Patient is speaking in a clear tone at moderate volume, and normal pace; with good eye contact. Her thought process is coherent and relevant; There is no indication that she is currently responding to internal/external stimuli or experiencing delusional thought content.  Patient denies suicidal/self-harm/homicidal ideation, psychosis, and paranoia.  Patient has remained calm throughout assessment and has answered questions appropriately.    Visit Diagnosis:    ICD-10-CM   1. MDD (major depressive disorder), recurrent severe, without psychosis (HCC)  F33.2     2. Sleep disturbance  G47.9       Past Psychiatric History:   Past Medical History:  Past Medical History:  Diagnosis Date   Anxiety and depression    Arthritis    B12 deficiency    pernicious anemia - monthly b12 shots   Cervical cancer (HCC) 12/2008   s/p hysterectomy   Crohn's disease (HCC) 2000   h/o stenotic crohn's ileitis, active in TI s/p resections 2000, 2016 PPD neg ([facility] Dr. Randa Evens); has been on cimzia, remicade, , entocort, now stable on Entyvio ([facility])   Genital warts    GERD (gastroesophageal reflux disease)    severe, daily sxs if off PPI   Hepatic steatosis 04/2011   diffuse on CT, 5mm gallbladder polyp   History of anemia    attributed to crohn's   History of chicken pox    History of syphilis 1980s   HTN (hypertension)    Migraines    and frequent other headaches (sinus or stress)   Scalp psoriasis    Sensorineural hearing loss of both ears 12/2012  high freq   Takotsubo syndrome 07/2012   cath 08/01/12, normal coronaries, LVEF 65%   TIA (transient ischemic attack) 05/2010   at [facility] - TIA vs complex migraine (w/u negative - carotids, echo, TC doppler, and MRI normal)   Tobacco abuse     Past Surgical History:  Procedure Laterality Date   APPENDECTOMY  1998   CARDIAC CATHETERIZATION   07/2012   normal LV fxn, widely patent coronaries   CERVICAL BIOPSY  W/ LOOP ELECTRODE EXCISION  10/2008   COLON RESECTION  10/2014   removal of scar tissue colon from prior surgery ([facility])   COLONOSCOPY  10/2008   ileo-colonic anastomosis, ielocolonic crohn's   COLONOSCOPY  05/2011   ileo-colonic anastomosis normal, normal mucosa   COLONOSCOPY  10/15/2012   focal inflammation at anastomosis, no active crohn's, planning MR enterograph Randa Evens)   COLONOSCOPY  09/2014   [place]   COLONOSCOPY WITH PROPOFOL N/A 10/10/2022   Procedure: COLONOSCOPY WITH PROPOFOL;  Surgeon: Toney Reil, MD;  Location: [facility] ENDOSCOPY;  Service: Gastroenterology;  Laterality: N/A;   ESOPHAGOGASTRODUODENOSCOPY  05/2011   nl esophagus, gastritis, nl duodenum   ESOPHAGOGASTRODUODENOSCOPY (EGD) WITH PROPOFOL N/A 10/10/2022   Procedure: ESOPHAGOGASTRODUODENOSCOPY (EGD) WITH PROPOFOL;  Surgeon: Toney Reil, MD;  Location: [facility] ENDOSCOPY;  Service: Gastroenterology;  Laterality: N/A;   KNEE SURGERY Left    LEFT HEART CATH AND CORONARY ANGIOGRAPHY Left 04/15/2020   Procedure: LEFT HEART CATH AND CORONARY ANGIOGRAPHY;  Surgeon: Alwyn Pea, MD;  Location: [facility] INVASIVE CV LAB;  Service: Cardiovascular;  Laterality: Left;   LEFT HEART CATHETERIZATION WITH CORONARY ANGIOGRAM N/A 08/01/2012   Procedure: LEFT HEART CATHETERIZATION WITH CORONARY ANGIOGRAM;  Surgeon: Tonny Bollman, MD;  Location: [facility] CATH LAB;  Service: Cardiovascular;  Laterality: N/A;   RIGHT COLECTOMY  2000   crohn's disease, ileocecal resection   SHOULDER ARTHROSCOPY W/ ROTATOR CUFF REPAIR Right 05/31/2012   [facility])   US ECHOCARDIOGRAPHY  05/2010   nl LV fxn ,EF 60%   VAGINAL HYSTERECTOMY  12/2008   LAVH/BSO    Family Psychiatric History:   Family History:  Family History  Problem Relation Age of Onset   Stroke Mother    Hypertension Mother    Hyperlipidemia Mother    Hypertension Father     Crohn's disease Father    Cancer Sister        ovarian   Crohn's disease Sister    Crohn's disease Daughter    Ulcerative colitis Daughter    Cancer Paternal Aunt        Brain   Crohn's disease Paternal Aunt    Crohn's disease Paternal Uncle    Stroke Paternal Grandmother    Diabetes Neg Hx     Social History:  Social History   Socioeconomic History   Marital status: Divorced    Spouse name: Not on file   Number of children: 3   Years of education: Not on file   Highest education level: Bachelor's degree (e.g., BA, AB, BS)  Occupational History   Occupation: Nursing Tech    Comment: [employer]  Tobacco Use   Smoking status: Former    Current packs/day: 0.00    Average packs/day: 0.3 packs/day for 10.0 years (3.3 ttl pk-yrs)    Types: Cigarettes    Start date: 11/15/2006    Quit date: 11/14/2016    Years since quitting: 6.6   Smokeless tobacco: Never   Tobacco comments:    Quit within past yr  Vaping Use   Vaping status: Never Used  Substance and Sexual Activity   Alcohol use: Yes    Comment: occ   Drug use: No    Comment: extensive drug use, remotely   Sexual activity: Not Currently    Birth control/protection: Surgical, Post-menopausal  Other Topics Concern   Not on file  Social History Narrative   Not on file   Social Determinants of Health   Financial Resource Strain: Not on file  Food Insecurity: Not on file  Transportation Needs: Not on file  Physical Activity: Unknown (02/12/2018)   Exercise Vital Sign    Days of Exercise per Week: 5 days    Minutes of Exercise per Session: Not on file  Stress: No Stress Concern Present (02/12/2018)   [employer] of Occupational Health - Occupational Stress Questionnaire    Feeling of Stress : Only a little  Social Connections: Not on file    Allergies:  Allergies  Allergen Reactions   Infliximab Dermatitis   Codeine Nausea And Vomiting   Flagyl [Metronidazole] Nausea And Vomiting   Hydrocodone Nausea And  Vomiting   Ibuprofen Other (See Comments)    Pt states that she is unable to take due to her Crohn's disease.   Remeron [Mirtazapine] Other (See Comments)    Reaction:  Makes pt lethargic     Metabolic Disorder Labs: Lab Results  Component Value Date   HGBA1C 5.5 05/26/2023   MPG 108 08/01/2012   MPG 103 06/09/2010   No results found for: "PROLACTIN" Lab Results  Component Value Date   CHOL 136 05/26/2023   TRIG 86.0 05/26/2023   HDL 54.60 05/26/2023   CHOLHDL 2 05/26/2023   VLDL 17.2 05/26/2023   LDLCALC 64 05/26/2023   LDLCALC 83 01/05/2018   Lab Results  Component Value Date   TSH 1.64 05/26/2023   TSH 3.899 08/01/2012    Therapeutic Level Labs: No results found for: "LITHIUM" No results found for: "VALPROATE" No results found for: "CBMZ"  Current Medications: Current Outpatient Medications  Medication Sig Dispense Refill   acetaminophen (TYLENOL) 500 MG tablet Take 1,000-1,500 mg by mouth 2 (two) times daily as needed for moderate pain or headache.     ARIPiprazole (ABILIFY) 5 MG tablet Take 1 tablet (5 mg total) by mouth at bedtime. 90 tablet 3   aspirin EC 81 MG tablet Take 1 tablet (81 mg total) by mouth daily. Over the counter 30 tablet 3   budesonide (ENTOCORT EC) 3 MG 24 hr capsule Take 3 capsules (9 mg total) by mouth daily. 90 capsule 2   cyanocobalamin (VITAMIN B12) 1000 MCG/ML injection Inject 1 mL (1,000 mcg total) into the muscle every 30 (thirty) days. 1 mL 11   escitalopram (LEXAPRO) 20 MG tablet Take 1 tablet (20 mg total) by mouth daily. 90 tablet 3   Melatonin 10 MG CAPS Take 10 mg by mouth at bedtime as needed (Sleep).     metoprolol tartrate (LOPRESSOR) 25 MG tablet Take 1 tablet (25 mg total) by mouth 2 (two) times daily. 180 tablet 3   mirtazapine (REMERON) 7.5 MG tablet Take 1 tablet (7.5 mg total) by mouth at bedtime. 30 tablet 0   omeprazole (PRILOSEC) 20 MG capsule Take 20 mg by mouth daily.     oxycodone (OXY-IR) 5 MG capsule Take 5 mg by  mouth every 6 (six) hours.     ramelteon (ROZEREM) 8 MG tablet Take 1 tablet (8 mg total) by mouth at bedtime. 10 tablet  0   rosuvastatin (CRESTOR) 5 MG tablet Take 1 tablet (5 mg total) by mouth at bedtime. 90 tablet 3   No current facility-administered medications for this visit.     Musculoskeletal: Strength & Muscle Tone: within normal limits Gait & Station: normal Patient leans: N/A  Psychiatric Specialty Exam: Review of Systems  There were no vitals taken for this visit.There is no height or weight on file to calculate BMI.  General Appearance: Casual  Eye Contact:  Good  Speech:  Clear and Coherent  Volume:  Normal  Mood:  Anxious and Depressed  Affect:  Congruent  Thought Process:  Coherent  Orientation:  Full (Time, Place, and Person)  Thought Content: Logical   Suicidal Thoughts:  No  Homicidal Thoughts:  No  Memory:  Immediate;   Good Recent;   Good  Judgement:  Good  Insight:  Good  Psychomotor Activity:  Normal  Concentration:  Concentration: Good  Recall:  Good  Fund of Knowledge: Good  Language: Good  Akathisia:  No  Handed:  Right  AIMS (if indicated): not done  Assets:  Desire for Improvement Social Support  ADL's:  Intact  Cognition: WNL  Sleep:  Good   Screenings: GAD-7    Flowsheet Row Office Visit from 06/23/2023 in [facility] [employer] Visit from 05/26/2023 in [facility] [employer] Visit from 07/19/2022 in [facility] [employer] Visit from 03/27/2018 in [facility] [place] Office Visit from 01/24/2018 in [facility]  Total GAD-7 Score 6 12 4 8 12       PHQ2-9    Flowsheet Row Office Visit from 07/10/2023 [facility] Visit from 06[facility]23/2023 in [facility] [place] [employer] Visit from 05/26/2023 in  [facility] [facility] [employer] Visit from 07/19/2022 in [facility] [employer] Visit from 03/27/2018 in [facility] [place]  PHQ-2 Total Score 2 0 3 2 2   PHQ-9 Total Score 8 6 17 6 11       Flowsheet Row ED from 03/16/2023 in [facility] at [facility] Admission (Discharged) from 10/10/2022 in [facility] ED from 06/10/2022 in [facility] at [facility]  C-SSRS RISK CATEGORY High Risk No Risk No Risk        Assessment and Plan: Avaline Jakob 67 year old who presents for medication management appointment.  Patient was recently discontinued from trazodone and initiated on Remeron.  She reports she has been taking and tolerating medications " fairly" reports getting pretty good rest with medication however,  continues to have a lingering sedation effect in the morning.  Discussed taking medication earlier and/or intermittently.  She was receptive to plan.  States overall her mood has improved.  She is denying suicidal or homicidal ideations.  Denies auditory or visual hallucinations.  Patient to follow-up 3 months.  Major depressive disorder Sleep disturbance  Continue Lexapro 20 mg daily Continue Abilify 5 mg daily Continue Remeron 15 mg nightly as needed  Follow-up 3 months for medication management appointment Collaboration of Care: Collaboration of Care: Medication Management AEB Continue Lexapro, Abilify and Remeron   Patient/Guardian was advised Release of Information must be obtained prior to any record release in order to collaborate their care with an outside provider. Patient/Guardian was advised if they  have not already done so to contact the registration department to sign all necessary forms in order for [place] to release information regarding their care.   Consent: Patient/Guardian gives verbal consent for  treatment and assignment of benefits for services provided during this visit. Patient/Guardian expressed understanding and agreed to proceed.    [name], NP 07/24/2023, 1:17 PM

## 2023-07-24 NOTE — Telephone Encounter (Signed)
Per Sharon [name] with [facility] infusion We can't get in touch with Ms. Sharon [name] to ship her meds

## 2023-07-25 ENCOUNTER — Other Ambulatory Visit: Payer: Self-pay | Admitting: *Deleted

## 2023-07-25 ENCOUNTER — Encounter (HOSPITAL_COMMUNITY): Payer: Self-pay | Admitting: Family

## 2023-07-25 ENCOUNTER — Inpatient Hospital Stay
Admission: RE | Admit: 2023-07-25 | Discharge: 2023-07-25 | Disposition: A | Discharge status: Home / Self Care | Payer: Self-pay | Source: Ambulatory Visit | Attending: Nurse Practitioner | Admitting: Nurse Practitioner

## 2023-07-25 DIAGNOSIS — Z1231 Encounter for screening mammogram for malignant neoplasm of breast: Secondary | ICD-10-CM

## 2023-07-26 DIAGNOSIS — K50012 Crohn's disease of small intestine with intestinal obstruction: Secondary | ICD-10-CM | POA: Diagnosis not present

## 2023-08-04 NOTE — Telephone Encounter (Signed)
Per Raiford Noble with [facility] speciality Happy Friday!  I see patient T.V. [birth year] is approved for her [name] injection with low copay so she will be ready to go once she completes her home infused loading doses. ??

## 2023-08-10 ENCOUNTER — Institutional Professional Consult (permissible substitution): Payer: PPO | Admitting: Nurse Practitioner

## 2023-08-28 DIAGNOSIS — K50012 Crohn's disease of small intestine with intestinal obstruction: Secondary | ICD-10-CM | POA: Diagnosis not present

## 2023-09-21 ENCOUNTER — Ambulatory Visit
Admission: RE | Admit: 2023-09-21 | Discharge: 2023-09-21 | Disposition: A | Discharge status: Home / Self Care | Payer: PPO | Source: Ambulatory Visit | Attending: Nurse Practitioner | Admitting: Nurse Practitioner

## 2023-09-21 DIAGNOSIS — Z1231 Encounter for screening mammogram for malignant neoplasm of breast: Secondary | ICD-10-CM | POA: Diagnosis not present

## 2023-09-26 ENCOUNTER — Encounter: Payer: Self-pay | Admitting: Nurse Practitioner

## 2023-10-02 ENCOUNTER — Encounter: Payer: Self-pay | Admitting: Internal Medicine

## 2023-10-02 ENCOUNTER — Ambulatory Visit: Payer: PPO | Admitting: Internal Medicine

## 2023-10-02 VITALS — BP 144/70 | HR 54 | Ht 61.0 in | Wt 162.0 lb

## 2023-10-02 DIAGNOSIS — G4710 Hypersomnia, unspecified: Secondary | ICD-10-CM

## 2023-10-02 DIAGNOSIS — G4733 Obstructive sleep apnea (adult) (pediatric): Secondary | ICD-10-CM

## 2023-10-02 NOTE — Progress Notes (Signed)
Name: Sharon [name] MRN: [id number] DOB: [birth year]    CHIEF COMPLAINT:  EXCESSIVE DAYTIME SLEEPINESS Assessment of OSA   HISTORY OF PRESENT ILLNESS: Patient is seen today for problems and issues with sleep related to excessive daytime sleepiness Patient  has been having sleep problems for many years Patient has been having excessive daytime sleepiness for a long time Patient has been having extreme fatigue and tiredness, lack of energy +  very Loud snoring every night + struggling breathe at night and gasps for air + morning headaches + Nonrefreshing sleep  Discussed sleep data and reviewed with patient.  Encouraged proper weight management.  Discussed driving precautions and its relationship with hypersomnolence.  Discussed operating dangerous equipment and its relationship with hypersomnolence.  Discussed sleep hygiene, and benefits of a fixed sleep waked time.  The importance of getting eight or more hours of sleep discussed with patient.  Discussed limiting the use of the computer and television before bedtime.  Decrease naps during the day, so night time sleep will become enhanced.  Limit caffeine, and sleep deprivation.  HTN, stroke, and heart failure are potential risk factors.    EPWORTH SLEEP SCORE 15  No exacerbation at this time No evidence of heart failure at this time No evidence or signs of infection at this time No respiratory distress No fevers, chills, nausea, vomiting, diarrhea No evidence of lower extremity edema No evidence hemoptysis  Chest x-ray reviewed 2022 Independently reviewed by me today No effusions no pneumonia No significant findings  PAST MEDICAL HISTORY :   has a past medical history of Anxiety and depression, Arthritis, B12 deficiency, Cervical cancer (HCC) (12/2008), Crohn's disease (HCC) (2000), Genital warts, GERD (gastroesophageal reflux disease), Hepatic steatosis (04/2011), History of anemia, History of chicken pox, History of  syphilis (1980s), HTN (hypertension), Migraines, Scalp psoriasis, Sensorineural hearing loss of both ears (12/2012), Takotsubo syndrome (07/2012), TIA (transient ischemic attack) (05/2010), and Tobacco abuse.  has a past surgical history that includes Appendectomy (1998); Right colectomy (2000); US ECHOCARDIOGRAPHY (05/2010); Colonoscopy (10/2008); Colonoscopy (05/2011); Esophagogastroduodenoscopy (05/2011); Cervical biopsy w/ loop electrode excision (10/2008); Vaginal hysterectomy (12/2008); Cardiac catheterization (07/2012); Colonoscopy (10/15/2012); Shoulder arthroscopy w/ rotator cuff repair (Right, 05/31/2012); left heart catheterization with coronary angiogram (N/A, 08/01/2012); Colonoscopy (09/2014); Colon resection (10/2014); Knee surgery (Left); LEFT HEART CATH AND CORONARY ANGIOGRAPHY (Left, 04/15/2020); Colonoscopy with propofol (N/A, 10/10/2022); and Esophagogastroduodenoscopy (egd) with propofol (N/A, 10/10/2022). Prior to Admission medications   Medication Sig Start Date End Date Taking? Authorizing Provider  acetaminophen (TYLENOL) 500 MG tablet Take 1,000-1,500 mg by mouth 2 (two) times daily as needed for moderate pain or headache.   Yes [provider]  ARIPiprazole (ABILIFY) 5 MG tablet Take 1 tablet (5 mg total) by mouth at bedtime. 05/26/23  Yes Bethanie Dicker, NP  aspirin EC 81 MG tablet Take 1 tablet (81 mg total) by mouth daily. Over the counter 05/01/16  Yes Rai, Ripudeep K, MD  budesonide (ENTOCORT EC) 3 MG 24 hr capsule Take 3 capsules (9 mg total) by mouth daily. 07/05/23  Yes Vanga, Loel Dubonnet, MD  cyanocobalamin (VITAMIN B12) 1000 MCG/ML injection Inject 1 mL (1,000 mcg total) into the muscle every 30 (thirty) days. 06/20/23  Yes Bethanie Dicker, NP  escitalopram (LEXAPRO) 20 MG tablet Take 1 tablet (20 mg total) by mouth daily. 05/26/23  Yes Bethanie Dicker, NP  Melatonin 10 MG CAPS Take 10 mg by mouth at bedtime as needed (Sleep).   Yes [provider]  metoprolol  tartrate (LOPRESSOR)  25 MG tablet Take 1 tablet (25 mg total) by mouth 2 (two) times daily. 05/26/23  Yes Bethanie Dicker, NP  mirtazapine (REMERON) 7.5 MG tablet Take 1 tablet (7.5 mg total) by mouth at bedtime. 07/10/23  Yes [name], NP  omeprazole (PRILOSEC) 20 MG capsule Take 20 mg by mouth daily.   Yes [provider]  oxycodone (OXY-IR) 5 MG capsule Take 5 mg by mouth every 6 (six) hours. 05/11/23  Yes [provider]  ramelteon (ROZEREM) 8 MG tablet Take 1 tablet (8 mg total) by mouth at bedtime. 07/14/23  Yes [name], NP  rosuvastatin (CRESTOR) 5 MG tablet Take 1 tablet (5 mg total) by mouth at bedtime. 05/26/23  Yes Bethanie Dicker, NP   Allergies  Allergen Reactions   Infliximab Dermatitis   Codeine Nausea And Vomiting   Flagyl [Metronidazole] Nausea And Vomiting   Hydrocodone Nausea And Vomiting   Ibuprofen Other (See Comments)    Pt states that she is unable to take due to her Crohn's disease.   Remeron [Mirtazapine] Other (See Comments)    Reaction:  Makes pt lethargic     FAMILY HISTORY:  family history includes Cancer in her paternal aunt and sister; Crohn's disease in her daughter, father, paternal aunt, paternal uncle, and sister; Hyperlipidemia in her mother; Hypertension in her father and mother; Stroke in her mother and paternal grandmother; Ulcerative colitis in her daughter. SOCIAL HISTORY:  reports that she quit smoking about 6 years ago. Her smoking use included cigarettes. She started smoking about 16 years ago. She has a 3.3 pack-year smoking history. She has never used smokeless tobacco. She reports current alcohol use. She reports that she does not use drugs.   Review of Systems:  Gen:  Denies  fever, sweats, chills weight loss  HEENT: Denies blurred vision, double vision, ear pain, eye pain, hearing loss, nose bleeds, sore throat Cardiac:  No dizziness, chest pain or heaviness, chest tightness,edema, No JVD Resp:   No cough, -sputum  production, -shortness of breath,-wheezing, -hemoptysis,  Gi: Denies swallowing difficulty, stomach pain, nausea or vomiting, diarrhea, constipation, bowel incontinence Gu:  Denies bladder incontinence, burning urine Ext:   Denies Joint pain, stiffness or swelling Skin: Denies  skin rash, easy bruising or bleeding or hives Endoc:  Denies polyuria, polydipsia , polyphagia or weight change Psych:   Denies depression, insomnia or hallucinations  Other:  All other systems negative   ALL OTHER ROS ARE NEGATIVE   BP (!) 144/70   Pulse (!) 54   Ht 5\' 1"  (1.549 m)   Wt 162 lb (73.5 kg)   SpO2 96%   BMI 30.61 kg/m    Physical Examination:   General Appearance: No distress  EYES PERRLA, EOM intact.   NECK Supple, No JVD ORAL CAVITY MALLAMPATI 4 Pulmonary: normal breath sounds, No wheezing.  CardiovascularNormal S1,S2.  No m/r/g.   Abdomen: Benign, Soft, non-tender. Skin:   warm, no rashes, no ecchymosis  Extremities: normal, no cyanosis, clubbing. Neuro:without focal findings,  speech normal  PSYCHIATRIC: Mood, affect within normal limits.   ALL OTHER ROS ARE NEGATIVE    ASSESSMENT AND PLAN SYNOPSIS  68 year old pleasant white female patient with signs and symptoms of excessive daytime sleepiness with probable underlying diagnosis of obstructive sleep apnea in the setting of obesity and deconditioned state   Recommend Sleep Study for definitve diagnosis Recommend home sleep study Be aware of reduced alertness and do not drive or operate heavy machinery if experiencing this  or drowsiness.  Exercise encouraged, as tolerated. Encouraged proper weight management.  Important to get eight or more hours of sleep  Limiting the use of the computer and television before bedtime.  Decrease naps during the day, so night time sleep will become enhanced.  Limit caffeine, and sleep deprivation.    Obesity -recommend significant weight loss -recommend changing diet  Deconditioned  state -Recommend increased daily activity and exercise   MEDICATION ADJUSTMENTS/LABS AND TESTS ORDERED: Recommend obtaining home sleep study to assess for sleep apnea Avoid Allergens and Irritants Avoid secondhand smoke Avoid SICK contacts Recommend  Masking  when appropriate Recommend Keep up-to-date with vaccinations   CURRENT MEDICATIONS REVIEWED AT LENGTH WITH PATIENT TODAY   Patient  satisfied with Plan of action and management. All questions answered  Follow up  3 months   I spent a total of  63 minutes reviewing chart data, face-to-face evaluation with the patient, counseling and coordination of care as detailed above.    Lucie Leather, M.D.  [facility]  Medical Director [facility] Medical Director [facility] Cardio-Pulmonary Department

## 2023-10-02 NOTE — Patient Instructions (Addendum)
Recommend obtaining home sleep study to assess for sleep apnea  Avoid Allergens and Irritants Avoid secondhand smoke Avoid SICK contacts Recommend  Masking  when appropriate Recommend Keep up-to-date with vaccinations   Be aware of reduced alertness and do not drive or operate heavy machinery if experiencing this or drowsiness.  Exercise encouraged, as tolerated. Encouraged proper weight management.  Important to get eight or more hours of sleep  Limiting the use of the computer and television before bedtime.  Decrease naps during the day, so night time sleep will become enhanced.  Limit caffeine, and sleep deprivation.         Marland Kitchenepwor

## 2023-10-04 DIAGNOSIS — K50012 Crohn's disease of small intestine with intestinal obstruction: Secondary | ICD-10-CM | POA: Diagnosis not present

## 2023-10-09 ENCOUNTER — Ambulatory Visit: Payer: PPO | Admitting: Gastroenterology

## 2023-10-10 DIAGNOSIS — K50012 Crohn's disease of small intestine with intestinal obstruction: Secondary | ICD-10-CM | POA: Diagnosis not present

## 2023-10-11 ENCOUNTER — Ambulatory Visit: Payer: PPO | Admitting: Gastroenterology

## 2023-10-11 ENCOUNTER — Other Ambulatory Visit: Payer: Self-pay | Admitting: Gastroenterology

## 2023-10-11 ENCOUNTER — Telehealth: Payer: Self-pay

## 2023-10-11 NOTE — Telephone Encounter (Signed)
Per Sherrilyn Rist With [facility] Patient received third dose IV Skyrizi on 10/10/23. Infusion was delayed due to patient unavailability. She tolerated infusion without adverse reactions or side effects. She reports feeling better after last infusion, with symptoms returning within the past 2 weeks. POT and meds reviewed with patient. No further nursing visits required. Patient to transition to [facility] for SQ Skyrizi.

## 2023-10-14 DIAGNOSIS — G4730 Sleep apnea, unspecified: Secondary | ICD-10-CM | POA: Diagnosis not present

## 2023-10-14 DIAGNOSIS — G4733 Obstructive sleep apnea (adult) (pediatric): Secondary | ICD-10-CM

## 2023-10-25 DIAGNOSIS — R069 Unspecified abnormalities of breathing: Secondary | ICD-10-CM | POA: Diagnosis not present

## 2023-11-03 ENCOUNTER — Encounter: Payer: Self-pay | Admitting: Internal Medicine

## 2023-11-03 DIAGNOSIS — G4733 Obstructive sleep apnea (adult) (pediatric): Secondary | ICD-10-CM

## 2023-11-06 ENCOUNTER — Ambulatory Visit: Payer: PPO | Admitting: Gastroenterology

## 2023-11-06 ENCOUNTER — Telehealth: Payer: Self-pay | Admitting: Nurse Practitioner

## 2023-11-06 NOTE — Telephone Encounter (Signed)
 Copied from CRM [id number]. Topic: Medicare AWV >> Nov 06, 2023  9:46 AM Payton Doughty wrote: Reason for CRM: Called LVM 11/06/2023 to schedule AWV. Please schedule office or virtual visits.  Verlee Rossetti; Care Guide Ambulatory Clinical Support [place] [facility] Direct Dial: [phone]

## 2023-11-07 ENCOUNTER — Encounter: Payer: Self-pay | Admitting: Nurse Practitioner

## 2023-11-08 ENCOUNTER — Encounter (HOSPITAL_COMMUNITY): Payer: Self-pay

## 2023-11-08 ENCOUNTER — Emergency Department (HOSPITAL_COMMUNITY): Admission: EM | Admit: 2023-11-08 | Discharge: 2023-11-08 | Disposition: A | Discharge status: Home / Self Care

## 2023-11-08 ENCOUNTER — Ambulatory Visit: Payer: Self-pay | Admitting: Nurse Practitioner

## 2023-11-08 ENCOUNTER — Other Ambulatory Visit: Payer: Self-pay

## 2023-11-08 DIAGNOSIS — D72829 Elevated white blood cell count, unspecified: Secondary | ICD-10-CM | POA: Diagnosis not present

## 2023-11-08 DIAGNOSIS — Z7982 Long term (current) use of aspirin: Secondary | ICD-10-CM | POA: Insufficient documentation

## 2023-11-08 DIAGNOSIS — R42 Dizziness and giddiness: Secondary | ICD-10-CM | POA: Diagnosis present

## 2023-11-08 DIAGNOSIS — I10 Essential (primary) hypertension: Secondary | ICD-10-CM | POA: Diagnosis not present

## 2023-11-08 DIAGNOSIS — H81399 Other peripheral vertigo, unspecified ear: Secondary | ICD-10-CM | POA: Insufficient documentation

## 2023-11-08 DIAGNOSIS — Z8673 Personal history of transient ischemic attack (TIA), and cerebral infarction without residual deficits: Secondary | ICD-10-CM | POA: Diagnosis not present

## 2023-11-08 DIAGNOSIS — Z8541 Personal history of malignant neoplasm of cervix uteri: Secondary | ICD-10-CM | POA: Insufficient documentation

## 2023-11-08 DIAGNOSIS — Z79899 Other long term (current) drug therapy: Secondary | ICD-10-CM | POA: Insufficient documentation

## 2023-11-08 LAB — BASIC METABOLIC PANEL
Anion gap: 11 (ref 5–15)
BUN: 6 mg/dL — ABNORMAL LOW (ref 8–23)
CO2: 24 mmol/L (ref 22–32)
Calcium: 9 mg/dL (ref 8.9–10.3)
Chloride: 106 mmol/L (ref 98–111)
Creatinine, Ser: 0.79 mg/dL (ref 0.44–1.00)
GFR, Estimated: >60 mL/min (ref >60)
Glucose, Bld: 97 mg/dL (ref 70–99)
Potassium: 3.3 mmol/L — ABNORMAL LOW (ref 3.5–5.1)
Sodium: 141 mmol/L (ref 135–145)

## 2023-11-08 LAB — CBC WITH DIFFERENTIAL/PLATELET
Abs Immature Granulocytes: 0.09 10*3/uL — ABNORMAL HIGH (ref 0.00–0.07)
Basophils Absolute: 0.1 10*3/uL (ref 0.0–0.1)
Basophils Relative: 1 %
Eosinophils Absolute: 0.1 10*3/uL (ref 0.0–0.5)
Eosinophils Relative: 1 %
HCT: 37.6 % (ref 36.0–46.0)
Hemoglobin: 12.1 g/dL (ref 12.0–15.0)
Immature Granulocytes: 1 %
Lymphocytes Relative: 22 %
Lymphs Abs: 2.8 10*3/uL (ref 0.7–4.0)
MCH: 29.3 pg (ref 26.0–34.0)
MCHC: 32.2 g/dL (ref 30.0–36.0)
MCV: 91 fL (ref 80.0–100.0)
Monocytes Absolute: 0.8 10*3/uL (ref 0.1–1.0)
Monocytes Relative: 6 %
Neutro Abs: 9 10*3/uL — ABNORMAL HIGH (ref 1.7–7.7)
Neutrophils Relative %: 69 %
Platelets: 175 10*3/uL (ref 150–400)
RBC: 4.13 MIL/uL (ref 3.87–5.11)
RDW: 15.7 % — ABNORMAL HIGH (ref 11.5–15.5)
WBC: 12.9 10*3/uL — ABNORMAL HIGH (ref 4.0–10.5)
nRBC: 0 % (ref 0.0–0.2)

## 2023-11-08 MED ORDER — MECLIZINE HCL 12.5 MG PO TABS: 12.5000 mg | ORAL_TABLET | Freq: Three times a day (TID) | ORAL | 0 refills | Status: DC | PRN

## 2023-11-08 MED ORDER — DIAZEPAM 2 MG PO TABS: 2.0000 mg | ORAL_TABLET | Freq: Four times a day (QID) | ORAL | 0 refills | Status: DC | PRN

## 2023-11-08 MED ORDER — AMLODIPINE BESYLATE 5 MG PO TABS: 5.0000 mg | ORAL_TABLET | Freq: Every day | ORAL | 0 refills | Status: DC

## 2023-11-08 MED ORDER — DIAZEPAM 2 MG PO TABS
2.0000 mg | ORAL_TABLET | Freq: Once | ORAL | Status: AC
  Administered 2023-11-08: 2 mg via ORAL
  Filled 2023-11-08: qty 1

## 2023-11-08 MED ORDER — DIAZEPAM 5 MG/ML IJ SOLN: 2.5000 mg | Freq: Once | INTRAMUSCULAR | Status: DC

## 2023-11-08 NOTE — Telephone Encounter (Signed)
  Chief Complaint: hypertension Symptoms: intermittent lightheaded and weakness Frequency: x 1 week Pertinent Negatives: Patient denies changes in vision/blurred vision/loss of vision, changes in speech, unilateral numbness or weakness, unsteady gait, headache, SOB. Disposition: [x] ED /[] Urgent Care (no appt availability in office) / [] Appointment(In office/virtual)/ []  [facility]/ [] Home Care/ [] Refused Recommended Disposition /[] [facility]/ []  Follow-up with PCP Additional Notes: Patient calling in to schedule office visit based off mychart message with office staff yesterday. During triage assessment, patient mentions she has been having intermittent dizziness and chest pain "sharp shooting" with the hypertension. Advised ED, patient asking if she could just come in for an office visit instead. Advised due to BP being elevated with cardiac and neurologic symptoms is it best to be evaluated in ED first before making office visit.  Copied from CRM [id number]. Topic: Clinical - Red Word Triage >> Nov 08, 2023  8:30 AM Gurney Maxin H wrote: [facility] that prompted transfer to Nurse Triage: Patient is reporting high blood pressure last night and today 195/89 183/81 and this morning 180/95 Reason for Disposition  [1] Systolic BP  >= 160 OR Diastolic >= 100 AND [2] cardiac (e.g., breathing difficulty, chest pain) or neurologic symptoms (e.g., new-onset blurred or double vision, unsteady gait)  Answer Assessment - Initial Assessment Questions 1. BLOOD PRESSURE: "What is the blood pressure?" "Did you take at least two measurements 5 minutes apart?"     BP 180/ 95 this morning around 0630 and states then she took a whole metoprolol.  2. ONSET: "When did you take your blood pressure?"     Patient states she first checked yesterday and BP was 195/89.  3. HOW: "How did you take your blood pressure?" (e.g., automatic home BP monitor, visiting nurse)     Automatic home BP monitor.  4.  HISTORY: "Do you have a history of high blood pressure?"     Yes.  5. MEDICINES: "Are you taking any medicines for blood pressure?" "Have you missed any doses recently?"     Metoprolol, she states her PCP had her cut it down to half a pill twice daily instead of the full 1 tablet twice daily.  6. OTHER SYMPTOMS: "Do you have any symptoms?" (e.g., blurred vision, chest pain, difficulty breathing, headache, weakness)     Lightheaded, weakness "feels like my knees are gonna give away so I'll sit down". Intermittent sharp shooting chest pain last just a second x couple of days.  Protocols used: Blood Pressure - High-A-AH

## 2023-11-08 NOTE — ED Provider Triage Note (Signed)
 Emergency Medicine Provider Triage Evaluation Note  Sharon [name] , a 68 y.o. female  was evaluated in triage.  Pt complains of a high blood pressure.  Review of Systems  Positive: High blood pressure Negative: Chest pain, shortness of breath, other complaint  Physical Exam  There were no vitals taken for this visit. Gen:   Awake, no distress   Resp:  Normal effort  MSK:   Moves extremities without difficulty  Other:    Medical Decision Making  Medically screening exam initiated at 10:54 AM.  Appropriate orders placed.  Samhitha H Santini was informed that the remainder of the evaluation will be completed by another provider, this initial triage assessment does not replace that evaluation, and the importance of remaining in the ED until their evaluation is complete.  Patient understands importance of remaining for the entirety of her ED evaluation.   Wynetta Fines, MD 11/08/23 1055

## 2023-11-08 NOTE — ED Notes (Signed)
 Pt being driven home by either son or daughter. Pt not driving herself home

## 2023-11-08 NOTE — ED Provider Notes (Signed)
 [place] EMERGENCY DEPARTMENT AT [facility] Provider Note   CSN: [id number] Arrival date & time: 11/08/23  1026     History {Add pertinent medical, surgical, social history, OB history to HPI:1} Chief Complaint  Patient presents with   Hypertension   Dizziness   Weakness    Sharon [name] is a 68 y.o. female  here for high blood pressure and dizziness. longstanding hx  of Hypertension however 6 mos ago her doctor halved her metoprolol because of bradycardia. Patient has not been taking her bp regularly went to the dentist yesterday and was noted to have systolic blood pressure of 180.  one week ago she started feeling "off."  The patient describes feeling dizziness at rest and with standing.  She has been ambulatory without low fall.  Help the dizzy connection the symptoms have been ongoing for about a week .  She does have tinnitus.  Patient reports that she has had this in the past.  Is never lasted quite this long but has had lasted for several days at a time.  It occurs at rest and with head movement.  She has never discussed her intermittent bouts of dizziness with anyone in the past.  Hypertension  Dizziness Associated symptoms: weakness   Weakness Associated symptoms: dizziness        Home Medications Prior to Admission medications   Medication Sig Start Date End Date Taking? Authorizing Provider  acetaminophen (TYLENOL) 500 MG tablet Take 1,000-1,500 mg by mouth 2 (two) times daily as needed for moderate pain or headache.    [provider]  ARIPiprazole (ABILIFY) 5 MG tablet Take 1 tablet (5 mg total) by mouth at bedtime. 05/26/23   Bethanie Dicker, NP  aspirin EC 81 MG tablet Take 1 tablet (81 mg total) by mouth daily. Over the counter 05/01/16   Rai, Ripudeep K, MD  budesonide (ENTOCORT EC) 3 MG 24 hr capsule Take 3 capsules (9 mg total) by mouth daily. 07/05/23   Toney Reil, MD  cyanocobalamin (VITAMIN B12) 1000 MCG/ML injection Inject 1 mL  (1,000 mcg total) into the muscle every 30 (thirty) days. 06/20/23   Bethanie Dicker, NP  escitalopram (LEXAPRO) 20 MG tablet Take 1 tablet (20 mg total) by mouth daily. 05/26/23   Bethanie Dicker, NP  Melatonin 10 MG CAPS Take 10 mg by mouth at bedtime as needed (Sleep).    [provider]  metoprolol tartrate (LOPRESSOR) 25 MG tablet Take 1 tablet (25 mg total) by mouth 2 (two) times daily. 05/26/23   Bethanie Dicker, NP  mirtazapine (REMERON) 7.5 MG tablet Take 1 tablet (7.5 mg total) by mouth at bedtime. 07/10/23   [name], NP  omeprazole (PRILOSEC) 20 MG capsule Take 20 mg by mouth daily.    [provider]  oxycodone (OXY-IR) 5 MG capsule Take 5 mg by mouth every 6 (six) hours. 05/11/23   [provider]  ramelteon (ROZEREM) 8 MG tablet Take 1 tablet (8 mg total) by mouth at bedtime. 07/14/23   [name], NP  rosuvastatin (CRESTOR) 5 MG tablet Take 1 tablet (5 mg total) by mouth at bedtime. 05/26/23   Bethanie Dicker, NP  SKYRIZI 180 MG/1.2ML SOCT USE THE ON-BODY INJECTOR TO  ADMINISTER THE CONTENTS OF 1  CARTRIDGE SUBCUTANEOUSLY EVERY 8 WEEKS STARTING AT WEEK 12 10/11/23   Toney Reil, MD      Allergies    Infliximab, Codeine, Flagyl [metronidazole], Hydrocodone, Ibuprofen, and Remeron [mirtazapine]  Review of Systems   Review of Systems  Neurological:  Positive for dizziness and weakness.    Physical Exam Updated Vital Signs BP (!) 204/60   Pulse (!) 57   Temp 98.1 F (36.7 C) (Oral)   Resp 16   Ht 5\' 1"  (1.549 m)   Wt 72.1 kg   SpO2 99%   BMI 30.04 kg/m  Physical Exam Vitals and nursing note reviewed.  Constitutional:      General: She is not in acute distress.    Appearance: She is well-developed. She is not diaphoretic.  HENT:     Head: Normocephalic and atraumatic.     Right Ear: External ear normal.     Left Ear: External ear normal.     Nose: Nose normal.     Mouth/Throat:     Mouth: Mucous membranes are moist.  Eyes:      General: No scleral icterus.    Conjunctiva/sclera: Conjunctivae normal.  Cardiovascular:     Rate and Rhythm: Normal rate and regular rhythm.     Heart sounds: Normal heart sounds. No murmur heard.    No friction rub. No gallop.  Pulmonary:     Effort: Pulmonary effort is normal. No respiratory distress.     Breath sounds: Normal breath sounds.  Abdominal:     General: Bowel sounds are normal. There is no distension.     Palpations: Abdomen is soft. There is no mass.     Tenderness: There is no abdominal tenderness. There is no guarding.  Musculoskeletal:     Cervical back: Normal range of motion.  Skin:    General: Skin is warm and dry.  Neurological:     Mental Status: She is alert and oriented to person, place, and time.     Cranial Nerves: No cranial nerve deficit.     Sensory: No sensory deficit.     Motor: No weakness.     Coordination: Coordination normal.     Gait: Gait normal.     Deep Tendon Reflexes: Reflexes normal.     Comments: Patient has right sided greater than 3 beat nystagmus, no rotary or bilateral nystagmus. She has positive head impulse for saccade when head is turned to the left Negative test of skew Normal finger-to-nose, normal heel-to-shin  Psychiatric:        Behavior: Behavior normal.     ED Results / Procedures / Treatments   Labs (all labs ordered are listed, but only abnormal results are displayed) Labs Reviewed  BASIC METABOLIC PANEL - Abnormal; Notable for the following components:      Result Value   Potassium 3.3 (*)    BUN 6 (*)    All other components within normal limits  CBC WITH DIFFERENTIAL/PLATELET - Abnormal; Notable for the following components:   WBC 12.9 (*)    RDW 15.7 (*)    Neutro Abs 9.0 (*)    Abs Immature Granulocytes 0.09 (*)    All other components within normal limits    EKG None  Radiology No results found.  Procedures Procedures  {Document cardiac monitor, telemetry assessment procedure when  appropriate:1}  Medications Ordered in ED Medications - No data to display  ED Course/ Medical Decision Making/ A&P   {   Click here for ABCD2, HEART and other calculatorsREFRESH Note before signing :1}  Medical Decision Making Risk Prescription drug management.   Patient with hypertension, hints exam positive for for peripheral vertigo.  Will discharge the patient with treatment including a few tabs of Valium and meclizine. Patient also has elevated blood pressure.  I reviewed the patient's labs.  She is a little bit of an elevated white blood cell count without significance.  {Document critical care time when appropriate:1} {Document review of labs and clinical decision tools ie heart score, Chads2Vasc2 etc:1}  {Document your independent review of radiology images, and any outside records:1} {Document your discussion with family members, caretakers, and with consultants:1} {Document social determinants of health affecting pt's care:1} {Document your decision making why or why not admission, treatments were needed:1} Final Clinical Impression(s) / ED Diagnoses Final diagnoses:  None    Rx / DC Orders ED Discharge Orders     None

## 2023-11-08 NOTE — ED Triage Notes (Signed)
 Pt reports that she went to the dentist yesterday and she had blood pressure 190 systolic. Pt reports keeping trac of her blood pressure all day yesterday and today when she woke up it was still elevated so she called her pcp and they sent her here for further evaluation. Pt reports the dizziness and weakness is transient she says she feels like her knees are about to give out when it happens.

## 2023-11-08 NOTE — Discharge Instructions (Addendum)
 1) keep a log of your blood pressures both today and tomorrow when you start the medication I have prescribed. 2) the most common side effect of the other blood pressure medication I am prescribing, amlodipine 5 mg, is swelling in the ankles.  This is a normal side effect and can be counteracted with compression socks.  Unless you are having significant shortness of breath it is okay to have a little bit of swelling in the ankles from this blood pressure medicine. 3) follow-up with your primary care physician about your dizziness.  She may be able to refer you to ENT or vestibular rehab which can help with the dizziness.  Please read all the attached sheets I am sending you home with.  Get help right away if you: Develop a severe headache or confusion. Have unusual weakness or numbness. Feel faint. Have severe pain in your chest or abdomen. Vomit repeatedly. Have trouble breathing. These symptoms may be an emergency. Get help right away. Call 911. Do not wait to see if the symptoms will go away. Do not drive yourself to the hospital.

## 2023-11-10 ENCOUNTER — Encounter: Payer: Self-pay | Admitting: Nurse Practitioner

## 2023-11-10 ENCOUNTER — Encounter: Payer: Self-pay | Admitting: Gastroenterology

## 2023-11-10 ENCOUNTER — Ambulatory Visit (INDEPENDENT_AMBULATORY_CARE_PROVIDER_SITE_OTHER): Admitting: Nurse Practitioner

## 2023-11-10 VITALS — BP 170/88 | HR 56 | Temp 98.3°F | Ht 61.0 in | Wt 164.6 lb

## 2023-11-10 DIAGNOSIS — G479 Sleep disorder, unspecified: Secondary | ICD-10-CM | POA: Diagnosis not present

## 2023-11-10 DIAGNOSIS — I10 Essential (primary) hypertension: Secondary | ICD-10-CM

## 2023-11-10 DIAGNOSIS — R42 Dizziness and giddiness: Secondary | ICD-10-CM | POA: Insufficient documentation

## 2023-11-10 NOTE — Assessment & Plan Note (Addendum)
 Managed by Psychiatry. She was on Trazodone and tried Rozerem with adverse side effects. Mirtazapine is now used infrequently due to grogginess and is not on a regular regimen. Continue mirtazapine as needed for sleep disturbances. Follow up with Psychiatry.

## 2023-11-10 NOTE — Assessment & Plan Note (Signed)
 No dizziness reported since starting medication. Meclizine is preferred due to lower sedative effects. Use meclizine as needed for vertigo and reserve diazepam for severe episodes.

## 2023-11-10 NOTE — Assessment & Plan Note (Signed)
 Blood pressure remains elevated despite medication adjustments. Norvasc requires more time for full effect. She reports no symptoms of chest pain, shortness of breath, dizziness, or edema. Continue Norvasc (amlodipine) 5 mg daily and Lopressor (metoprolol) at half a tablet twice a day. Recheck blood pressure before departure today. Schedule a nurse visit in two weeks for blood pressure check and lab work. Monitor blood pressure at home daily and maintain a log. Review the log at the two-week follow-up and adjust medication based on readings. Advised decreasing salt intake. We will continue to monitor closely. Return precautions given to patient.

## 2023-11-10 NOTE — Progress Notes (Signed)
 Sharon Dicker, NP-C Phone: [phone]  Sharon [name] is a 68 y.o. female who presents today for hospital follow up.   Discussed the use of AI scribe software for clinical note transcription with the patient, who gave verbal consent to proceed.  History of Present Illness   Sharon [name] is a 68 year old female with hypertension who presents for a hospital follow-up due to elevated blood pressure.  She is following up after a recent hospital visit where her blood pressure was significantly elevated, reaching 195 mmHg during a dental visit. She experienced dizziness, prompting her to seek emergency care. She was started on Norvasc (amlodipine) 5 mg daily, and her Lopressor (metoprolol) was reduced to half a tablet twice a day due to previously low pulse and blood pressure. She has been monitoring her blood pressure at home but has not observed a significant decrease yet. No current chest pain, shortness of breath, dizziness, or swelling.  She was previously diagnosed with vertigo and was prescribed diazepam and meclizine for management. She prefers to use meclizine and reserves diazepam for severe episodes. No dizziness since starting the medication.  She is managing her mental health with Abilify and Lexapro. She occasionally uses mirtazapine for sleep disturbances, but only as needed due to grogginess. She is followed by Psychiatry.  She has a follow-up planned with her gastroenterologist and is in the process of starting Skyrizi injections, pending stabilization of her blood pressure.      Social History   Tobacco Use  Smoking Status Former   Current packs/day: 0.00   Average packs/day: 0.3 packs/day for 10.0 years (3.3 ttl pk-yrs)   Types: Cigarettes   Start date: 11/15/2006   Quit date: 11/14/2016   Years since quitting: 6.9  Smokeless Tobacco Never  Tobacco Comments   Quit within past yr    Current Outpatient Medications on File Prior to Visit  Medication Sig Dispense  Refill   acetaminophen (TYLENOL) 500 MG tablet Take 1,000-1,500 mg by mouth 2 (two) times daily as needed for moderate pain or headache.     amLODipine (NORVASC) 5 MG tablet Take 1 tablet (5 mg total) by mouth daily. 30 tablet 0   ARIPiprazole (ABILIFY) 5 MG tablet Take 1 tablet (5 mg total) by mouth at bedtime. 90 tablet 3   aspirin EC 81 MG tablet Take 1 tablet (81 mg total) by mouth daily. Over the counter 30 tablet 3   budesonide (ENTOCORT EC) 3 MG 24 hr capsule Take 3 capsules (9 mg total) by mouth daily. 90 capsule 2   cyanocobalamin (VITAMIN B12) 1000 MCG/ML injection Inject 1 mL (1,000 mcg total) into the muscle every 30 (thirty) days. 1 mL 11   diazepam (VALIUM) 2 MG tablet Take 1 tablet (2 mg total) by mouth every 6 (six) hours as needed (Vertigo). 8 tablet 0   escitalopram (LEXAPRO) 20 MG tablet Take 1 tablet (20 mg total) by mouth daily. 90 tablet 3   meclizine (ANTIVERT) 12.5 MG tablet Take 1 tablet (12.5 mg total) by mouth 3 (three) times daily as needed for dizziness. 30 tablet 0   Melatonin 10 MG CAPS Take 10 mg by mouth at bedtime as needed (Sleep).     metoprolol tartrate (LOPRESSOR) 25 MG tablet Take 1 tablet (25 mg total) by mouth 2 (two) times daily. 180 tablet 3   omeprazole (PRILOSEC) 20 MG capsule Take 20 mg by mouth daily.     rosuvastatin (CRESTOR) 5 MG tablet Take 1 tablet (5 mg  total) by mouth at bedtime. 90 tablet 3   SKYRIZI 180 MG/1.2ML SOCT USE THE ON-BODY INJECTOR TO  ADMINISTER THE CONTENTS OF 1  CARTRIDGE SUBCUTANEOUSLY EVERY 8 WEEKS STARTING AT WEEK 12 1.2 mL 5   mirtazapine (REMERON) 7.5 MG tablet Take 1 tablet (7.5 mg total) by mouth at bedtime. 30 tablet 0   No current facility-administered medications on file prior to visit.    ROS see history of present illness  Objective  Physical Exam Vitals:   11/10/23 1105 11/10/23 1122  BP: (!) 170/74 (!) 170/88  Pulse: (!) 56   Temp: 98.3 F (36.8 C)   SpO2: 96%     BP Readings from Last 3 Encounters:   11/10/23 (!) 170/88  11/08/23 (!) 173/77  10/02/23 (!) 144/70   Wt Readings from Last 3 Encounters:  11/10/23 164 lb 9.6 oz (74.7 kg)  11/08/23 159 lb (72.1 kg)  10/02/23 162 lb (73.5 kg)    Physical Exam Constitutional:      General: She is not in acute distress.    Appearance: Normal appearance.  HENT:     Head: Normocephalic.  Cardiovascular:     Rate and Rhythm: Normal rate and regular rhythm.     Heart sounds: Normal heart sounds.  Pulmonary:     Effort: Pulmonary effort is normal.     Breath sounds: Normal breath sounds.  Skin:    General: Skin is warm and dry.  Neurological:     General: No focal deficit present.     Mental Status: She is alert.  Psychiatric:        Mood and Affect: Mood normal.        Behavior: Behavior normal.    Assessment/Plan: Please see individual problem list.  Essential hypertension Assessment & Plan: Blood pressure remains elevated despite medication adjustments. Norvasc requires more time for full effect. She reports no symptoms of chest pain, shortness of breath, dizziness, or edema. Continue Norvasc (amlodipine) 5 mg daily and Lopressor (metoprolol) at half a tablet twice a day. Recheck blood pressure before departure today. Schedule a nurse visit in two weeks for blood pressure check and lab work. Monitor blood pressure at home daily and maintain a log. Review the log at the two-week follow-up and adjust medication based on readings. Advised decreasing salt intake. We will continue to monitor closely. Return precautions given to patient.   Orders: -     Basic metabolic panel; Future  Vertigo Assessment & Plan: No dizziness reported since starting medication. Meclizine is preferred due to lower sedative effects. Use meclizine as needed for vertigo and reserve diazepam for severe episodes.   Sleep disturbance Assessment & Plan: Managed by Psychiatry. She was on Trazodone and tried Rozerem with adverse side effects. Mirtazapine is  now used infrequently due to grogginess and is not on a regular regimen. Continue mirtazapine as needed for sleep disturbances. Follow up with Psychiatry.     Return in about 2 weeks (around 11/24/2023) for Blood pressure check with nursing and labs then 3 month follow up.   Sharon Dicker, NP-C [facility]

## 2023-11-13 ENCOUNTER — Ambulatory Visit: Admitting: Gastroenterology

## 2023-11-14 ENCOUNTER — Ambulatory Visit: Admitting: Nurse Practitioner

## 2023-11-27 ENCOUNTER — Other Ambulatory Visit: Payer: Self-pay

## 2023-11-27 ENCOUNTER — Ambulatory Visit (INDEPENDENT_AMBULATORY_CARE_PROVIDER_SITE_OTHER)

## 2023-11-27 ENCOUNTER — Other Ambulatory Visit: Payer: Self-pay | Admitting: Nurse Practitioner

## 2023-11-27 ENCOUNTER — Telehealth: Payer: Self-pay

## 2023-11-27 DIAGNOSIS — E876 Hypokalemia: Secondary | ICD-10-CM

## 2023-11-27 DIAGNOSIS — I10 Essential (primary) hypertension: Secondary | ICD-10-CM | POA: Diagnosis not present

## 2023-11-27 LAB — BASIC METABOLIC PANEL WITH GFR
BUN: 8 mg/dL (ref 6–23)
CO2: 30 meq/L (ref 19–32)
Calcium: 8.8 mg/dL (ref 8.4–10.5)
Chloride: 104 meq/L (ref 96–112)
Creatinine, Ser: 0.79 mg/dL (ref 0.40–1.20)
GFR: 78.17 mL/min (ref >60.00)
Glucose, Bld: 97 mg/dL (ref 70–99)
Potassium: 2.9 meq/L — ABNORMAL LOW (ref 3.5–5.1)
Sodium: 144 meq/L (ref 135–145)

## 2023-11-27 MED ORDER — POTASSIUM CHLORIDE CRYS ER 20 MEQ PO TBCR: 20.0000 meq | EXTENDED_RELEASE_TABLET | Freq: Every day | ORAL | 2 refills | Status: DC

## 2023-11-27 NOTE — Progress Notes (Signed)
 Pt presented for BP check. Pt was identified through two identifiers.     Pt denies: chest pain, shortness of breath, headache, nausea, blurred vision     Pts BP after 5-6 minutes of rest:  BP: 150/70   Due to BP being elevated pt was advised that I would let her sit for another 5-6 minutes to recheck.    2nd BP check:  BP: 160/80   Pts BP was still elevated confirmed again with pt if she was having any symptoms and she stated no she just felt tired. Pt was then sent to the lab for labs.

## 2023-11-27 NOTE — Telephone Encounter (Signed)
 Pt came in for BP check and dropped off BP readings. Readings have been placed in provider to be reviewed folder

## 2023-12-03 ENCOUNTER — Other Ambulatory Visit: Payer: Self-pay | Admitting: Nurse Practitioner

## 2023-12-04 MED ORDER — AMLODIPINE BESYLATE 5 MG PO TABS: 5.0000 mg | ORAL_TABLET | Freq: Every day | ORAL | 2 refills | Status: DC

## 2023-12-05 ENCOUNTER — Other Ambulatory Visit (INDEPENDENT_AMBULATORY_CARE_PROVIDER_SITE_OTHER)

## 2023-12-05 DIAGNOSIS — E876 Hypokalemia: Secondary | ICD-10-CM | POA: Diagnosis not present

## 2023-12-05 NOTE — Telephone Encounter (Signed)
 Vm left for pt to CB

## 2023-12-06 LAB — POTASSIUM: Potassium: 3.3 mmol/L — ABNORMAL LOW (ref 3.5–5.3)

## 2023-12-08 ENCOUNTER — Ambulatory Visit: Admitting: Nurse Practitioner

## 2023-12-08 ENCOUNTER — Encounter: Payer: Self-pay | Admitting: Nurse Practitioner

## 2023-12-08 VITALS — BP 160/80 | HR 64 | Temp 97.7°F | Ht 61.0 in | Wt 168.2 lb

## 2023-12-08 DIAGNOSIS — E876 Hypokalemia: Secondary | ICD-10-CM | POA: Diagnosis not present

## 2023-12-08 DIAGNOSIS — I10 Essential (primary) hypertension: Secondary | ICD-10-CM | POA: Diagnosis not present

## 2023-12-08 MED ORDER — LOSARTAN POTASSIUM 50 MG PO TABS: 50.0000 mg | ORAL_TABLET | Freq: Every day | ORAL | 0 refills | Status: DC

## 2023-12-08 NOTE — Progress Notes (Signed)
 Bluford Burkitt, NP-C Phone: [phone]  Sharon [name] is a 68 y.o. female who presents today for follow up.   Discussed the use of AI scribe software for clinical note transcription with the patient, who gave verbal consent to proceed.  History of Present Illness   Sharon [name] is a 68 year old female with hypertension who presents for blood pressure management.  She is experiencing persistently elevated blood pressure readings at home, ranging from 160s to 180s systolic, with the most recent reading being 188/84 mmHg. She feels tired and has occasional headaches. No chest pain, shortness of breath, or dizziness. She notes occasional ankle swelling, which she attributes to her current medication, amlodipine.  Her current medications include amlodipine 5 mg daily and metoprolol 12.5 mg twice daily. The metoprolol dose was reduced to half a tablet twice daily due to low pulse rates. She is also on a potassium supplement due to previously low potassium levels, which have improved but remain a concern. She has been taking Skyrizi for four months, but it is unclear if this affects her potassium levels.  In terms of lifestyle, she does not believe she consumes an excessive amount of salt, although she acknowledges she might eat more than she should. She is monitoring her blood pressure regularly at home.      Social History   Tobacco Use  Smoking Status Former   Current packs/day: 0.00   Average packs/day: 0.3 packs/day for 10.0 years (3.3 ttl pk-yrs)   Types: Cigarettes   Start date: 11/15/2006   Quit date: 11/14/2016   Years since quitting: 7.0  Smokeless Tobacco Never  Tobacco Comments   Quit within past yr    Current Outpatient Medications on File Prior to Visit  Medication Sig Dispense Refill   acetaminophen (TYLENOL) 500 MG tablet Take 1,000-1,500 mg by mouth 2 (two) times daily as needed for moderate pain or headache.     amLODipine (NORVASC) 5 MG tablet Take 1 tablet (5 mg  total) by mouth daily. 30 tablet 2   ARIPiprazole (ABILIFY) 5 MG tablet Take 1 tablet (5 mg total) by mouth at bedtime. 90 tablet 3   aspirin EC 81 MG tablet Take 1 tablet (81 mg total) by mouth daily. Over the counter 30 tablet 3   budesonide (ENTOCORT EC) 3 MG 24 hr capsule Take 3 capsules (9 mg total) by mouth daily. 90 capsule 2   cyanocobalamin (VITAMIN B12) 1000 MCG/ML injection Inject 1 mL (1,000 mcg total) into the muscle every 30 (thirty) days. 1 mL 11   diazepam (VALIUM) 2 MG tablet Take 1 tablet (2 mg total) by mouth every 6 (six) hours as needed (Vertigo). 8 tablet 0   escitalopram (LEXAPRO) 20 MG tablet Take 1 tablet (20 mg total) by mouth daily. 90 tablet 3   meclizine (ANTIVERT) 12.5 MG tablet Take 1 tablet (12.5 mg total) by mouth 3 (three) times daily as needed for dizziness. 30 tablet 0   Melatonin 10 MG CAPS Take 10 mg by mouth at bedtime as needed (Sleep).     metoprolol tartrate (LOPRESSOR) 25 MG tablet Take 1 tablet (25 mg total) by mouth 2 (two) times daily. 180 tablet 3   mirtazapine (REMERON) 7.5 MG tablet Take 1 tablet (7.5 mg total) by mouth at bedtime. 30 tablet 0   omeprazole (PRILOSEC) 20 MG capsule Take 20 mg by mouth daily.     potassium chloride SA (KLOR-CON M) 20 MEQ tablet Take 1 tablet (20 mEq total) by  mouth daily. 30 tablet 2   rosuvastatin (CRESTOR) 5 MG tablet Take 1 tablet (5 mg total) by mouth at bedtime. 90 tablet 3   SKYRIZI 180 MG/1.2ML SOCT USE THE ON-BODY INJECTOR TO  ADMINISTER THE CONTENTS OF 1  CARTRIDGE SUBCUTANEOUSLY EVERY 8 WEEKS STARTING AT WEEK 12 1.2 mL 5   No current facility-administered medications on file prior to visit.     ROS see history of present illness  Objective  Physical Exam Vitals:   12/08/23 1450 12/08/23 1503  BP: (!) 178/80 (!) 160/80  Pulse: 64   Temp: 97.7 F (36.5 C)   SpO2: 96%     BP Readings from Last 3 Encounters:  12/08/23 (!) 160/80  11/27/23 (!) 160/80  11/10/23 (!) 170/88   Wt Readings from  Last 3 Encounters:  12/08/23 168 lb 3.2 oz (76.3 kg)  11/10/23 164 lb 9.6 oz (74.7 kg)  11/08/23 159 lb (72.1 kg)    Physical Exam Constitutional:      General: She is not in acute distress.    Appearance: Normal appearance.  HENT:     Head: Normocephalic.  Cardiovascular:     Rate and Rhythm: Normal rate and regular rhythm.     Heart sounds: Normal heart sounds.  Pulmonary:     Effort: Pulmonary effort is normal.     Breath sounds: Normal breath sounds.  Skin:    General: Skin is warm and dry.  Neurological:     General: No focal deficit present.     Mental Status: She is alert.  Psychiatric:        Mood and Affect: Mood normal.        Behavior: Behavior normal.     Assessment/Plan: Please see individual problem list.  Essential hypertension Assessment & Plan: Blood pressure remains elevated with ankle swelling likely due to amlodipine. Add losartan 50 mg daily for additional control. Start losartan and continue amlodipine 5 mg daily and metoprolol 12.5 mg twice daily. Monitor blood pressure at home and decrease salt intake. Return for a blood pressure check and BMP in 2 weeks with nursing.   Orders: -     Losartan Potassium; Take 1 tablet (50 mg total) by mouth daily.  Dispense: 90 tablet; Refill: 0 -     Basic metabolic panel with GFR; Future  Hypokalemia Assessment & Plan: Potassium levels are low but improved with supplementation. Monitor levels with the addition of losartan. Continue potassium supplement daily, dissolving it in water if there is difficulty swallowing. Check BMP in 2 weeks.     Return in 2 weeks (on 12/22/2023) for Blood pressure check with nursing and lab, then on 6/18 for follow up as scheduled.   Bluford Burkitt, NP-C [facility]

## 2023-12-11 DIAGNOSIS — F32A Depression, unspecified: Secondary | ICD-10-CM | POA: Diagnosis not present

## 2023-12-11 DIAGNOSIS — F419 Anxiety disorder, unspecified: Secondary | ICD-10-CM | POA: Diagnosis not present

## 2023-12-11 DIAGNOSIS — G4733 Obstructive sleep apnea (adult) (pediatric): Secondary | ICD-10-CM | POA: Diagnosis not present

## 2023-12-13 DIAGNOSIS — E876 Hypokalemia: Secondary | ICD-10-CM | POA: Insufficient documentation

## 2023-12-13 NOTE — Assessment & Plan Note (Signed)
 Blood pressure remains elevated with ankle swelling likely due to amlodipine. Add losartan 50 mg daily for additional control. Start losartan and continue amlodipine 5 mg daily and metoprolol 12.5 mg twice daily. Monitor blood pressure at home and decrease salt intake. Return for a blood pressure check and BMP in 2 weeks with nursing.

## 2023-12-13 NOTE — Assessment & Plan Note (Signed)
 Potassium levels are low but improved with supplementation. Monitor levels with the addition of losartan. Continue potassium supplement daily, dissolving it in water if there is difficulty swallowing. Check BMP in 2 weeks.

## 2023-12-19 ENCOUNTER — Other Ambulatory Visit: Payer: Self-pay | Admitting: Gastroenterology

## 2023-12-19 ENCOUNTER — Other Ambulatory Visit: Payer: Self-pay | Admitting: Nurse Practitioner

## 2023-12-19 DIAGNOSIS — E876 Hypokalemia: Secondary | ICD-10-CM

## 2023-12-19 NOTE — Telephone Encounter (Signed)
 Last office visit 07/04/2024 crohn's disease  Last refill 07/04/2024 90 capsule 2 refills  Has appointment in May

## 2023-12-22 ENCOUNTER — Ambulatory Visit: Payer: PPO | Admitting: Nurse Practitioner

## 2023-12-22 ENCOUNTER — Ambulatory Visit

## 2023-12-22 DIAGNOSIS — I10 Essential (primary) hypertension: Secondary | ICD-10-CM

## 2023-12-22 NOTE — Progress Notes (Addendum)
 Patient here for nurse visit BP check per order from [facility].   Patient reports compliance with prescribed BP medications: yes    Last dose of BP medication4/25/26:   BP Readings from Last 3 Encounters:  12/22/23 (!) 146/84  12/08/23 (!) 160/80  11/27/23 (!) 160/80   Pulse Readings from Last 3 Encounters:  12/22/23 80  12/08/23 64  11/10/23 (!) 56     Pt informed to continue taking bp medication as directed  Patient verbalized understanding of instructions.

## 2023-12-23 ENCOUNTER — Other Ambulatory Visit: Payer: Self-pay | Admitting: Gastroenterology

## 2023-12-23 ENCOUNTER — Encounter: Payer: Self-pay | Admitting: Nurse Practitioner

## 2023-12-23 LAB — BASIC METABOLIC PANEL WITHOUT GFR
BUN: 8 mg/dL (ref 7–25)
CO2: 28 mmol/L (ref 20–32)
Calcium: 8.6 mg/dL (ref 8.6–10.4)
Chloride: 106 mmol/L (ref 98–110)
Creat: 0.74 mg/dL (ref 0.50–1.05)
Glucose, Bld: 170 mg/dL — ABNORMAL HIGH (ref 65–99)
Potassium: 3.8 mmol/L (ref 3.5–5.3)
Sodium: 142 mmol/L (ref 135–146)

## 2023-12-25 ENCOUNTER — Encounter: Payer: Self-pay | Admitting: Nurse Practitioner

## 2023-12-25 NOTE — Telephone Encounter (Signed)
 Last office visit 07/04/2024 crohn's disease  Last refill 07/04/2024 90 capsule 2 refills  Has appointment in May  Dr. Baldomero Bone denied the medication on 12/20/2023

## 2023-12-26 ENCOUNTER — Other Ambulatory Visit: Payer: Self-pay | Admitting: Nurse Practitioner

## 2023-12-26 ENCOUNTER — Ambulatory Visit (INDEPENDENT_AMBULATORY_CARE_PROVIDER_SITE_OTHER)

## 2023-12-26 DIAGNOSIS — E538 Deficiency of other specified B group vitamins: Secondary | ICD-10-CM

## 2023-12-26 DIAGNOSIS — I10 Essential (primary) hypertension: Secondary | ICD-10-CM

## 2023-12-26 MED ORDER — LOSARTAN POTASSIUM 100 MG PO TABS
100.0000 mg | ORAL_TABLET | Freq: Every day | ORAL | 0 refills | Status: DC
Start: 2023-12-26 — End: 2024-04-01

## 2023-12-26 MED ORDER — CYANOCOBALAMIN 1000 MCG/ML IJ SOLN
1000.0000 ug | Freq: Once | INTRAMUSCULAR | Status: AC
  Administered 2023-12-26: 1000 ug via INTRAMUSCULAR

## 2023-12-26 NOTE — Progress Notes (Signed)
 Patient presented for B 12 injection to left deltoid, patient voiced no concerns nor showed any signs of distress during injection.

## 2023-12-30 ENCOUNTER — Other Ambulatory Visit: Payer: Self-pay | Admitting: Nurse Practitioner

## 2023-12-30 DIAGNOSIS — I10 Essential (primary) hypertension: Secondary | ICD-10-CM

## 2024-01-10 ENCOUNTER — Ambulatory Visit (INDEPENDENT_AMBULATORY_CARE_PROVIDER_SITE_OTHER): Admitting: *Deleted

## 2024-01-10 VITALS — Ht 61.0 in | Wt 157.0 lb

## 2024-01-10 DIAGNOSIS — Z780 Asymptomatic menopausal state: Secondary | ICD-10-CM | POA: Diagnosis not present

## 2024-01-10 DIAGNOSIS — G4733 Obstructive sleep apnea (adult) (pediatric): Secondary | ICD-10-CM | POA: Diagnosis not present

## 2024-01-10 DIAGNOSIS — F419 Anxiety disorder, unspecified: Secondary | ICD-10-CM | POA: Diagnosis not present

## 2024-01-10 DIAGNOSIS — F32A Depression, unspecified: Secondary | ICD-10-CM | POA: Diagnosis not present

## 2024-01-10 DIAGNOSIS — Z0000 Encounter for general adult medical examination without abnormal findings: Secondary | ICD-10-CM | POA: Diagnosis not present

## 2024-01-10 NOTE — Progress Notes (Signed)
 Subjective:   Sharon [name] is a 68 y.o. who presents for a Medicare Wellness preventive visit.  As a reminder, Annual Wellness Visits don't include a physical exam, and some assessments may be limited, especially if this visit is performed virtually. We may recommend an in-person visit if needed.  Visit Complete: Virtual I connected with  Sharon [name] on 01/10/24 by a audio enabled telemedicine application and verified that I am speaking with the correct person using two identifiers.  Patient Location: Home  Provider Location: Home Office  I discussed the limitations of evaluation and management by telemedicine. The patient expressed understanding and agreed to proceed.  Vital Signs: Because this visit was a virtual/telehealth visit, some criteria may be missing or patient reported. Any vitals not documented were not able to be obtained and vitals that have been documented are patient reported.  VideoDeclined- This patient declined Librarian, academic. Therefore the visit was completed with audio only.  Persons Participating in Visit: Patient.  AWV Questionnaire: No: Patient Medicare AWV questionnaire was not completed prior to this visit.  Cardiac Risk Factors include: advanced age (>29men, >62 women);dyslipidemia;hypertension     Objective:     Today's Vitals   01/10/24 1503  Weight: 157 lb (71.2 kg)  Height: 5\' 1"  (1.549 m)   Body mass index is 29.66 kg/m.     01/10/2024    3:15 PM 11/08/2023   11:04 AM 03/16/2023   10:21 AM 10/10/2022    9:01 AM 10/11/2020   11:47 AM 10/10/2020    5:15 PM 10/09/2020    7:13 PM  Advanced Directives  Does Patient Have a Medical Advance Directive? No No No No No No No  Would patient like information on creating a medical advance directive? No - Patient declined    No - Patient declined      Current Medications (verified) Outpatient Encounter Medications as of 01/10/2024  Medication Sig    acetaminophen  (TYLENOL ) 500 MG tablet Take 1,000-1,500 mg by mouth 2 (two) times daily as needed for moderate pain or headache.   amLODipine  (NORVASC ) 5 MG tablet TAKE 1 TABLET (5 MG TOTAL) BY MOUTH DAILY.   ARIPiprazole  (ABILIFY ) 5 MG tablet Take 1 tablet (5 mg total) by mouth at bedtime.   aspirin  EC 81 MG tablet Take 1 tablet (81 mg total) by mouth daily. Over the counter   cyanocobalamin  (VITAMIN B12) 1000 MCG/ML injection Inject 1 mL (1,000 mcg total) into the muscle every 30 (thirty) days.   diazepam  (VALIUM ) 2 MG tablet Take 1 tablet (2 mg total) by mouth every 6 (six) hours as needed (Vertigo).   escitalopram  (LEXAPRO ) 20 MG tablet Take 1 tablet (20 mg total) by mouth daily.   losartan  (COZAAR ) 100 MG tablet Take 1 tablet (100 mg total) by mouth daily.   meclizine  (ANTIVERT ) 12.5 MG tablet Take 1 tablet (12.5 mg total) by mouth 3 (three) times daily as needed for dizziness.   metoprolol  tartrate (LOPRESSOR ) 25 MG tablet Take 1 tablet (25 mg total) by mouth 2 (two) times daily.   omeprazole (PRILOSEC) 20 MG capsule Take 20 mg by mouth daily.   potassium chloride  SA (KLOR-CON  M) 20 MEQ tablet TAKE 1 TABLET BY MOUTH EVERY DAY   rosuvastatin  (CRESTOR ) 5 MG tablet Take 1 tablet (5 mg total) by mouth at bedtime.   SKYRIZI 180 MG/1.2ML SOCT USE THE ON-BODY INJECTOR TO  ADMINISTER THE CONTENTS OF 1  CARTRIDGE SUBCUTANEOUSLY EVERY 8 WEEKS STARTING AT WEEK  12   budesonide  (ENTOCORT EC ) 3 MG 24 hr capsule Take 3 capsules (9 mg total) by mouth daily. (Patient not taking: Reported on 01/10/2024)   Melatonin 10 MG CAPS Take 10 mg by mouth at bedtime as needed (Sleep). (Patient not taking: Reported on 01/10/2024)   mirtazapine  (REMERON ) 7.5 MG tablet Take 1 tablet (7.5 mg total) by mouth at bedtime. (Patient not taking: Reported on 01/10/2024)   No facility-administered encounter medications on file as of 01/10/2024.    Allergies (verified) Infliximab , Codeine, Flagyl  [metronidazole ], Hydrocodone ,  Ibuprofen, and Remeron  [mirtazapine ]   History: Past Medical History:  Diagnosis Date   Anxiety and depression    Arthritis    B12 deficiency    pernicious anemia - monthly b12 shots   Cervical cancer (HCC) 12/2008   s/p hysterectomy   Crohn's disease (HCC) 2000   h/o stenotic crohn's ileitis, active in TI s/p resections 2000, 2016 PPD neg ([facility] Dr. Denece Finger); has been on cimzia, remicade , , entocort, now stable on Entyvio  ([facility])   Genital warts    GERD (gastroesophageal reflux disease)    severe, daily sxs if off PPI   Hepatic steatosis 04/2011   diffuse on CT, 5mm gallbladder polyp   History of anemia    attributed to crohn's   History of chicken pox    History of syphilis 1980s   HTN (hypertension)    Migraines    and frequent other headaches (sinus or stress)   Scalp psoriasis    Sensorineural hearing loss of both ears 12/2012   high freq   Takotsubo syndrome 07/2012   cath 08/01/12, normal coronaries, LVEF 65%   TIA (transient ischemic attack) 05/2010   at [facility] - TIA vs complex migraine (w/u negative - carotids, echo, TC doppler, and MRI normal)   Tobacco abuse    Past Surgical History:  Procedure Laterality Date   APPENDECTOMY  1998   CARDIAC CATHETERIZATION  07/2012   normal LV fxn, widely patent coronaries   CERVICAL BIOPSY  W/ LOOP ELECTRODE EXCISION  10/2008   COLON RESECTION  10/2014   removal of scar tissue colon from prior surgery ([facility])   COLONOSCOPY  10/2008   ileo-colonic anastomosis, ielocolonic crohn's   COLONOSCOPY  05/2011   ileo-colonic anastomosis normal, normal mucosa   COLONOSCOPY  10/15/2012   focal inflammation at anastomosis, no active crohn's, planning MR enterograph Denece Finger)   COLONOSCOPY  09/2014   [place]   COLONOSCOPY WITH PROPOFOL  N/A 10/10/2022   Procedure: COLONOSCOPY WITH PROPOFOL ;  Surgeon: Selena Daily, MD;  Location: [facility] ENDOSCOPY;  Service: Gastroenterology;  Laterality: N/A;    ESOPHAGOGASTRODUODENOSCOPY  05/2011   nl esophagus, gastritis, nl duodenum   ESOPHAGOGASTRODUODENOSCOPY (EGD) WITH PROPOFOL  N/A 10/10/2022   Procedure: ESOPHAGOGASTRODUODENOSCOPY (EGD) WITH PROPOFOL ;  Surgeon: Selena Daily, MD;  Location: [facility] ENDOSCOPY;  Service: Gastroenterology;  Laterality: N/A;   KNEE SURGERY Left    LEFT HEART CATH AND CORONARY ANGIOGRAPHY Left 04/15/2020   Procedure: LEFT HEART CATH AND CORONARY ANGIOGRAPHY;  Surgeon: Antonette Batters, MD;  Location: [facility] INVASIVE CV LAB;  Service: Cardiovascular;  Laterality: Left;   LEFT HEART CATHETERIZATION WITH CORONARY ANGIOGRAM N/A 08/01/2012   Procedure: LEFT HEART CATHETERIZATION WITH CORONARY ANGIOGRAM;  Surgeon: Arnoldo Lapping, MD;  Location: [facility] CATH LAB;  Service: Cardiovascular;  Laterality: N/A;   RIGHT COLECTOMY  2000   crohn's disease, ileocecal resection   SHOULDER ARTHROSCOPY W/ ROTATOR CUFF REPAIR Right 05/31/2012   [facility] Deeann Fare)  US  ECHOCARDIOGRAPHY  05/2010   nl LV fxn ,EF 60%   VAGINAL HYSTERECTOMY  12/2008   LAVH/BSO   Family History  Problem Relation Age of Onset   Stroke Mother    Hypertension Mother    Hyperlipidemia Mother    Hypertension Father    Crohn's disease Father    Cancer Sister        ovarian   Crohn's disease Sister    Crohn's disease Daughter    Ulcerative colitis Daughter    Cancer Paternal Aunt        Brain   Crohn's disease Paternal Aunt    Crohn's disease Paternal Uncle    Stroke Paternal Grandmother    Diabetes Neg Hx    Breast cancer Neg Hx    Social History   Socioeconomic History   Marital status: Divorced    Spouse name: Not on file   Number of children: 3   Years of education: Not on file   Highest education level: Bachelor's degree (e.g., BA, AB, BS)  Occupational History   Occupation: Nursing Tech    Comment: [employer]  Tobacco Use   Smoking status: Former    Current packs/day: 0.00    Average packs/day: 0.3 packs/day for 10.0 years (3.3 ttl  pk-yrs)    Types: Cigarettes    Start date: 11/15/2006    Quit date: 11/14/2016    Years since quitting: 7.1   Smokeless tobacco: Never   Tobacco comments:    Quit within past yr  Vaping Use   Vaping status: Never Used  Substance and Sexual Activity   Alcohol use: Yes    Comment: occ   Drug use: No    Comment: extensive drug use, remotely   Sexual activity: Not Currently    Birth control/protection: Surgical, Post-menopausal  Other Topics Concern   Not on file  Social History Narrative   Not on file   Social Drivers of Health   Financial Resource Strain: Low Risk  (01/10/2024)   Overall Financial Resource Strain (CARDIA)    Difficulty of Paying Living Expenses: Not hard at all  Food Insecurity: No Food Insecurity (01/10/2024)   Hunger Vital Sign    Worried About Running Out of Food in the Last Year: Never true    Ran Out of Food in the Last Year: Never true  Transportation Needs: No Transportation Needs (01/10/2024)   PRAPARE - Administrator, Civil Service (Medical): No    Lack of Transportation (Non-Medical): No  Physical Activity: Inactive (01/10/2024)   Exercise Vital Sign    Days of Exercise per Week: 0 days    Minutes of Exercise per Session: 0 min  Stress: Stress Concern Present (01/10/2024)   [employer] of Occupational Health - Occupational Stress Questionnaire    Feeling of Stress : To some extent  Social Connections: Moderately Isolated (01/10/2024)   Social Connection and Isolation Panel [NHANES]    Frequency of Communication with Friends and Family: More than three times a week    Frequency of Social Gatherings with Friends and Family: More than three times a week    Attends Religious Services: More than 4 times per year    Active Member of [employer] or Organizations: No    Attends Banker Meetings: Never    Marital Status: Divorced    Tobacco Counseling Counseling given: Not Answered Tobacco comments: Quit within past  yr    Clinical Intake:  Pre-visit preparation completed: Yes  Pain : No/denies  pain     BMI - recorded: 29.66 Nutritional Status: BMI 25 -29 Overweight Nutritional Risks: Nausea/ vomitting/ diarrhea (has Crohn's) Diabetes: No  Lab Results  Component Value Date   HGBA1C 5.5 05/26/2023   HGBA1C 5.6 01/05/2018   HGBA1C 5.7 05/09/2013     How often do you need to have someone help you when you read instructions, pamphlets, or other written materials from your doctor or pharmacy?: 1 - Never  Interpreter Needed?: No  Information entered by :: R. Ezana Hubbert LPN   Activities of Daily Living     01/10/2024    3:05 PM  In your present state of health, do you have any difficulty performing the following activities:  Hearing? 0  Vision? 0  Comment glasses  Difficulty concentrating or making decisions? 1  Walking or climbing stairs? 1  Dressing or bathing? 0  Doing errands, shopping? 0  Preparing Food and eating ? N  Using the Toilet? N  In the past six months, have you accidently leaked urine? Y  Do you have problems with loss of bowel control? N  Managing your Medications? N  Managing your Finances? N  Housekeeping or managing your Housekeeping? N    Patient Care Team: Bluford Burkitt, NP as PCP - General (Nurse Practitioner) Mhoon, Austine Lefort, MD (Psychiatry)  Indicate any recent Medical Services you may have received from other than [facility] providers in the past year (date may be approximate).     Assessment:    This is a routine wellness examination for Uyen.  Hearing/Vision screen Hearing Screening - Comments:: No issues Vision Screening - Comments:: glasses   Goals Addressed             This Visit's Progress    Patient Stated       Wants to lose weight       Depression Screen     01/10/2024    3:11 PM 07/10/2023    1:55 PM 06/23/2023    8:53 AM 05/26/2023    9:07 AM 07/19/2022   10:01 AM 03/27/2018   12:53 PM 01/24/2018   12:54 PM  PHQ 2/9 Scores  PHQ  - 2 Score 0  0 3 2 2 2   PHQ- 9 Score 1  6 17 6 11 14      Information is confidential and restricted. Go to Review Flowsheets to unlock data.    Fall Risk     01/10/2024    3:08 PM 05/26/2023    9:07 AM 07/19/2022    9:26 AM  Fall Risk   Falls in the past year? 1 0 0  Number falls in past yr: 0 0 0  Injury with Fall? 0 0 0  Risk for fall due to : History of fall(s);Impaired balance/gait No Fall Risks No Fall Risks  Follow up Falls evaluation completed;Falls prevention discussed Falls evaluation completed Falls evaluation completed    MEDICARE RISK AT HOME:  Medicare Risk at Home Any stairs in or around the home?: Yes If so, are there any without handrails?: No Home free of loose throw rugs in walkways, pet beds, electrical cords, etc?: Yes Adequate lighting in your home to reduce risk of falls?: Yes Life alert?: No Use of a cane, walker or w/c?: No Grab bars in the bathroom?: No Shower chair or bench in shower?: No Elevated toilet seat or a handicapped toilet?: No  TIMED UP AND GO:  Was the test performed?  No  Cognitive Function: 6CIT completed  01/10/2024    3:16 PM  6CIT Screen  What Year? 0 points  What month? 0 points  What time? 0 points  Count back from 20 0 points  Months in reverse 0 points  Repeat phrase 0 points  Total Score 0 points    Immunizations Immunization History  Administered Date(s) Administered   Fluad Trivalent(High Dose 65+) 06/23/2023   Fluzone Influenza virus vaccine,trivalent (IIV3), split virus 09/24/2015   Hep A / Hep B 04/03/2013, 10/08/2013   Hepatitis B, ADULT 06/04/2013   Influenza Split 08/02/2012, 06/12/2014   Influenza, Quadrivalent, Recombinant, Inj, Pf 05/30/2017   Influenza,inj,Quad PF,6+ Mos 06/04/2013, 09/24/2015, 08/10/2016   Influenza-Unspecified 08/30/2015   PFIZER Comirnaty(Gray Top)Covid-19 Tri-Sucrose Vaccine 09/04/2019, 09/23/2019, 07/03/2020   PPD Test 07/02/2014, 03/27/2018   Pneumococcal Conjugate-13  07/02/2014   Pneumococcal Polysaccharide-23 11/10/2011   Tdap 11/10/2011   Zoster Recombinant(Shingrix ) 07/05/2023, 09/06/2023    Screening Tests Health Maintenance  Topic Date Due   Medicare Annual Wellness (AWV)  Never done   Cervical Cancer Screening (Pap smear)  02/13/2019   Pneumonia Vaccine 83+ Years old (3 of 3 - PCV20 or PCV21) 07/03/2019   DTaP/Tdap/Td (2 - Td or Tdap) 11/09/2021   DEXA SCAN  11/23/2022   COVID-19 Vaccine (4 - 2024-25 season) 04/30/2023   INFLUENZA VACCINE  03/29/2024   MAMMOGRAM  09/20/2024   Colonoscopy  10/10/2029   Hepatitis C Screening  Completed   Zoster Vaccines- Shingrix   Completed   HPV VACCINES  Aged Out   Meningococcal B Vaccine  Aged Out    Health Maintenance  Health Maintenance Due  Topic Date Due   Medicare Annual Wellness (AWV)  Never done   Cervical Cancer Screening (Pap smear)  02/13/2019   Pneumonia Vaccine 87+ Years old (3 of 3 - PCV20 or PCV21) 07/03/2019   DTaP/Tdap/Td (2 - Td or Tdap) 11/09/2021   DEXA SCAN  11/23/2022   COVID-19 Vaccine (4 - 2024-25 season) 04/30/2023   Health Mainten ance Items Addressed: DEXA ordered Discussed the need to update tetanus, pneumonia and covid vaccines.  Patient stated that she has had a total hysterectomy and no longer gets Pap smears.   Additional Screening:  Vision Screening: Recommended annual ophthalmology exams for early detection of glaucoma and other disorders of the eye. Up to date [employer] Screening: Recommended annual dental exams for proper oral hygiene  Community Resource Referral / Chronic Care Management: CRR required this visit?  No   CCM required this visit?  No   Plan:    I have personally reviewed and noted the following in the patient's chart:   Medical and social history Use of alcohol, tobacco or illicit drugs  Current medications and supplements including opioid prescriptions. Patient is not currently taking opioid  prescriptions. Functional ability and status Nutritional status Physical activity Advanced directives List of other physicians Hospitalizations, surgeries, and ER visits in previous 12 months Vitals Screenings to include cognitive, depression, and falls Referrals and appointments  In addition, I have reviewed and discussed with patient certain preventive protocols, quality metrics, and best practice recommendations. A written personalized care plan for preventive services as well as general preventive health recommendations were provided to patient.   Felicitas Horse, LPN   [id number]   After Visit Summary: (MyChart) Due to this being a telephonic visit, the after visit summary with patients personalized plan was offered to patient via MyChart   Notes: Please refer to Routing Comments.

## 2024-01-10 NOTE — Patient Instructions (Addendum)
 [place] [name] , Thank you for taking time out of your busy schedule to complete your Annual Wellness Visit with me. I enjoyed our conversation and look forward to speaking with you again next year. I, as well as your care team,  appreciate your ongoing commitment to your health goals. Please review the following plan we discussed and let me know if I can assist you in the future. Your Game plan/ To Do List    Referrals: If you haven't heard from the office you've been referred to, please reach out to them at the phone provided.   Order placed for Dexa/Bone Density. Remember to update your tetanus, pneumonia and covid vaccines. You have an order for:   []   2D Mammogram  []   3D Mammogram  [x]   Bone Density     Please call for appointment:  [facility]  [address] [place] [address] [phone]    Make sure to wear two-piece clothing.  No lotions, powders, or deodorants the day of the appointment. Make sure to bring picture ID and insurance card.  Bring list of medications you are currently taking including any supplements.   Follow up Visits: Next Medicare AWV with our clinical staff: 01/14/25 @ 3:00   Have you seen your provider in the last 6 months (3 months if uncontrolled diabetes)? Yes Next Office Visit with your provider: 02/14/24  Clinician Recommendations:  Aim for 30 minutes of exercise or brisk walking, 6-8 glasses of water, and 5 servings of fruits and vegetables each day.       This is a list of the screening recommended for you and due dates:  Health Maintenance  Topic Date Due   Pap Smear  02/13/2019   Pneumonia Vaccine (3 of 3 - PCV20 or PCV21) 07/03/2019   DTaP/Tdap/Td vaccine (2 - Td or Tdap) 11/09/2021   DEXA scan (bone density measurement)  11/23/2022   COVID-19 Vaccine (4 - 2024-25 season) 04/30/2023   Flu Shot  03/29/2024   Mammogram  09/20/2024   Medicare Annual Wellness Visit  01/09/2025   Colon  Cancer Screening  10/10/2029   Hepatitis C Screening  Completed   Zoster (Shingles) Vaccine  Completed   HPV Vaccine  Aged Out   Meningitis B Vaccine  Aged Out    Advanced directives: (ACP Link)Information on Advanced Care Planning can be found at [facility]  Secretary of [facility] Advance Health Care Directives Advance Health Care Directives. [email or web address]  Advance Care Planning is important because it:  [x]  Makes sure you receive the medical care that is consistent with your values, goals, and preferences  [x]  It provides guidance to your family and loved ones and reduces their decisional burden about whether or not they are making the right decisions based on your wishes.  Follow the link provided in your after visit summary or read over the paperwork we have mailed to you to help you started getting your Advance Directives in place. If you need assistance in completing these, please reach out to us  so that we can help you!

## 2024-01-17 ENCOUNTER — Ambulatory Visit: Admitting: Gastroenterology

## 2024-01-17 ENCOUNTER — Encounter: Payer: Self-pay | Admitting: Gastroenterology

## 2024-01-17 VITALS — BP 128/74 | HR 60 | Temp 98.3°F | Wt 165.0 lb

## 2024-01-17 DIAGNOSIS — E538 Deficiency of other specified B group vitamins: Secondary | ICD-10-CM | POA: Diagnosis not present

## 2024-01-17 DIAGNOSIS — D649 Anemia, unspecified: Secondary | ICD-10-CM

## 2024-01-17 DIAGNOSIS — K50012 Crohn's disease of small intestine with intestinal obstruction: Secondary | ICD-10-CM

## 2024-01-17 MED ORDER — RIFAXIMIN 550 MG PO TABS: 550.0000 mg | ORAL_TABLET | Freq: Three times a day (TID) | ORAL | 0 refills | Status: DC

## 2024-01-17 NOTE — Progress Notes (Signed)
 Karma Oz, MD [address]  [address]  [place], [place] [address]  Main: [phone]  Fax: [phone]    Gastroenterology Consultation  Referring Provider:     Bluford Burkitt, NP Primary Care Physician:  Bluford Burkitt, NP Primary Gastroenterologist:  Dr. Karma Oz Reason for Consultation: Crohn's disease        HPI:   [place] [name] is a 68 y.o. female referred by Bluford Burkitt, NP  for consultation & management of small bowel Crohn's, history of ileocolonic resection with primary anastomosis.  Patient was previously seen by [facility] gastroenterology, was on Entyvio  monotherapy every 8 weeks.  She self discontinued Entyvio  in June 2023 because she was worried about flareup of skin lesions as well as logistics in receiving the infusion which involved commuting to [place].  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  She has been noticing flareup of her GI symptoms including diarrhea, rectal bleeding, urgency and some weight loss, loss of appetite.  Follow-up visit 07/05/2023 [name] is here for follow-up of Crohn's disease.  She has originally seen me on 08/18/2022 to establish care for Crohn's disease.  Subsequently, she underwent endoscopy which was unremarkable, colonoscopy revealed inflammation at the ileocolonic anastomosis, which was chronic moderate active enterocolitis.  Neoterminal ileum could not be traversed.  Therefore, underwent MR enterography which did not reveal any active small bowel Crohn's and neoterminal ileum.  Her fecal calprotectin levels were normal.  I have empirically treated her for possible bacterial overgrowth with 2 weeks course of Xifaxan  and patient cannot recall if this has really helped with her symptoms.  She did not follow-up after workup.  She reports that since September, her symptoms have worsened including abdominal pain, several episodes of nonbloody diarrhea, symptoms at night as well associated with nausea, loss of appetite.  She  reports that she was on Entocort in the past which helped with her symptoms.  Follow-up visit 01/17/2024 [name] is here for follow-up of Crohn's disease.  She is on Skyrizi, received her first maintenance shot and has been doing well.  Her diarrhea significantly reports having 1-2 formed to semiformed bowel movements daily.  However, she does have intermittent episodes of nausea sometimes vomiting with abdominal cramps and bloating, she does attribute it to sometimes what she eats.  She wants to lose weight as well.  She denies any bleeding.  Crohn's disease classification:  Age: > 40 Location: Ileal or colonic or ileocolonic or only upper GI Behavior: non stricturing, non penetrating or stricturing or penetrating  Perianal: Yes or no  IBD diagnosis: Year 2000 which presented with small bowel obstruction  Disease course:  Crohn's ileitis diagnosed in 2000, stricturing phenotype and for which she has undergone ileal resection in 2000.  In 2016 she had further episodes of obstruction and she underwent a resection in February 2016. She clearly improved after surgery. She has failed Certolizumab (primary non-responder), Infliximab  (blisters). She was taking 6-MP in combination with Vedolizumab  for some time after her 2016 surgery but this was discontinued.  Patient was initially followed by [facility]. Rheba Cedar at [facility] until 2020 when she switched her care to [facility].  [name] started Vedolizumab  monotherapy in 2020.  This has resulted in clinical remission. In 01/2019, MRE revealed no active Crohn's and hepatic steatosis. In 04/2019, colonoscopy revealed moderate (i2) inflammation in neo-TI. Anastomosis in [facility] noted. Normal colon.  Colonoscopy in 09/2022 revealed moderate chronic active enterocolitis at the anastomosis,  started her on Skyrizi  Extra intestinal manifestations: None  IBD surgical history: Ileocolonic resection in a year 2000  Imaging:  MRE 02/15/2019 No evidence  of active inflammatory bowel disease, diffuse hepatic steatosis MRE 10/25/22 IMPRESSION: 1. Surgical changes from prior small bowel resection and ileal-colonic anastomosis. No MR findings suspicious for active/recurrent Crohn's disease. 2. No inflammatory changes or obstructive findings. 3. No abdominal or pelvic mass or adenopathy. 4. Examination is limited by respiratory motion. CT is probably a better option for this patient if future imaging is indicated.  CTE 11/14/2021 No CT evidence of active disease, diffuse hepatic steatosis SBFT none  Procedures: Upper endoscopy 10/10/2022 - Normal duodenal bulb and second portion of the duodenum. Biopsied. - Erythematous mucosa in the antrum and prepyloric region of the stomach. Biopsied. - Normal gastric body and incisura. Biopsied. - Esophagogastric landmarks identified. - Normal gastroesophageal junction and esophagus.  Colonoscopy 10/10/2022 - Hemorrhoids found on perianal exam. - Patent end- to- side ileo- colonic anastomosis, characterized by ulceration. Biopsied. - Normal mucosa in the left colon and in the right colon. Biopsied. - Non- bleeding external and internal hemorrhoids. DIAGNOSIS: A.  DUODENUM, RANDOM; COLD BIOPSY: - DUODENAL MUCOSA WITH FOCAL PROMINENT BRUNNER'S GLANDS, NON-SPECIFIC. - NEGATIVE FOR GRANULOMAS, DYSPLASIA, AND MALIGNANCY.  B.  STOMACH, RANDOM; COLD BIOPSY: - ANTRAL MUCOSA WITH MODERATE CHRONIC ACTIVE GASTRITIS. - OXYNTIC MUCOSA WITH FOCAL REACTIVE GASTRITIS. - IHC FOR H. PYLORI IS NEGATIVE - NEGATIVE FOR GRANULOMAS, INTESTINAL METAPLASIA, DYSPLASIA, AND MALIGNANCY. - SEE COMMENT.  C.  ILEOCOLONIC ANASTOMOSIS, RANDOM; COLD BIOPSY: - FOCAL MODERATE CHRONIC ACTIVE ENTEROCOLITIS CONSISTENT WITH ANASTOMOTIC SITE. - IHC FOR CMV IS NEGATIVE. - NEGATIVE FOR GRANULOMAS, DYSPLASIA, AND MALIGNANCY.  D.  COLON, RIGHT RANDOM; COLD BIOPSY: - UNREMARKABLE COLONIC MUCOSA. - NEGATIVE FOR GRANULOMAS, DYSPLASIA, AND  MALIGNANCY.  E.  COLON, LEFT RANDOM; COLD BIOPSY: - SMALL TUBULAR ADENOMA (ONE FRAGMENT). - UNREMARKABLE COLONIC MUCOSA (MULTIPLE FRAGMENTS). - NEGATIVE FOR GRANULOMAS, HIGH-GRADE DYSPLASIA AND MALIGNANCY.   Colonoscopy 04/2019, revealed moderate (i2) inflammation in neo-TI. Anastomosis in [facility] noted. Normal colon. Impression: - Perianal skin tags found on perianal exam. - Crohn's disease with ileitis. Biopsied. - Patent side-to-side ileo-colonic anastomosis,  characterized by healthy appearing mucosa. - The examination was otherwise normal on direct and  retroflexion views. - Biopsies for surveillance were taken from the entire  colon.  A.  Ileum, endoscopic biopsy: Small intestinal mucosa with no significant pathologic alteration. No active inflammation, chronic colitis, or granuloma formation is seen. Negative for dysplasia.   B.  Right colon, endoscopic biopsy: Colonic mucosa with no significant pathologic alteration. No active inflammation, chronic colitis, or granuloma formation is seen. Negative for dysplasia.   C.  Transverse colon, endoscopic biopsy: Colonic mucosa with no significant pathologic alteration. No active inflammation, chronic colitis, or granuloma formation is seen. Negative for dysplasia.   D.  Left colon, endoscopic biopsy: Colonic mucosa with no significant pathologic alteration. No active inflammation, chronic colitis, or granuloma formation is seen. Negative for dysplasia.  Colonoscopy at [facility] 10/07/2015 NEOTERMINAL ILEUM, BIOPSIES:       Enteric mucosa with no significant diagnostic abnormality.       No active inflammation, granulomas or dysplasia.   Upper Endoscopy none  VCE none  IBD medications:  Steroids: Prednisone , budesonide  5-ASA: None  Immunomodulators: Received Imuran, methotrexate none TPMT status unknown Biologics:  Anti TNFs: Received Cimzia, primary nonresponder.  Remicade  resulted in skin blisters Anti  Integrins: Entyvio  initiated in 2016 after second resection, later discontinued.  Restarted in 2020 as monotherapy every 8 weeks, self discontinued in 01/2022, Ustekinumab: Tofactinib: Clinical trial:   NSAIDs: None  Antiplts/Anticoagulants/Anti thrombotics: None   Past Medical History:  Diagnosis Date   Anxiety and depression    Arthritis    B12 deficiency    pernicious anemia - monthly b12 shots   Cervical cancer (HCC) 12/2008   s/p hysterectomy   Crohn's disease (HCC) 2000   h/o stenotic crohn's ileitis, active in TI s/p resections 2000, 2016 PPD neg ([facility] Dr. Denece Finger); has been on cimzia, remicade , , entocort, now stable on Entyvio  ([place] at [facility])   Genital warts    GERD (gastroesophageal reflux disease)    severe, daily sxs if off PPI   Hepatic steatosis 04/2011   diffuse on CT, 5mm gallbladder polyp   History of anemia    attributed to crohn's   History of chicken pox    History of syphilis 1980s   HTN (hypertension)    Migraines    and frequent other headaches (sinus or stress)   Scalp psoriasis    Sensorineural hearing loss of both ears 12/2012   high freq   Takotsubo syndrome 07/2012   cath 08/01/12, normal coronaries, LVEF 65%   TIA (transient ischemic attack) 05/2010   at [facility] - TIA vs complex migraine (w/u negative - carotids, echo, TC doppler, and MRI normal)   Tobacco abuse     Past Surgical History:  Procedure Laterality Date   APPENDECTOMY  1998   CARDIAC CATHETERIZATION  07/2012   normal LV fxn, widely patent coronaries   CERVICAL BIOPSY  W/ LOOP ELECTRODE EXCISION  10/2008   COLON RESECTION  10/2014   removal of scar tissue colon from prior surgery ([place] at [facility])   COLONOSCOPY  10/2008   ileo-colonic anastomosis, ielocolonic crohn's   COLONOSCOPY  05/2011   ileo-colonic anastomosis normal, normal mucosa   COLONOSCOPY  10/15/2012   focal inflammation at anastomosis, no active crohn's, planning MR enterograph Denece Finger)   COLONOSCOPY   09/2014   [place]   COLONOSCOPY WITH PROPOFOL  N/A 10/10/2022   Procedure: COLONOSCOPY WITH PROPOFOL ;  Surgeon: Selena Daily, MD;  Location: [facility] ENDOSCOPY;  Service: Gastroenterology;  Laterality: N/A;   ESOPHAGOGASTRODUODENOSCOPY  05/2011   nl esophagus, gastritis, nl duodenum   ESOPHAGOGASTRODUODENOSCOPY (EGD) WITH PROPOFOL  N/A 10/10/2022   Procedure: ESOPHAGOGASTRODUODENOSCOPY (EGD) WITH PROPOFOL ;  Surgeon: Selena Daily, MD;  Location: [facility] ENDOSCOPY;  Service: Gastroenterology;  Laterality: N/A;   KNEE SURGERY Left    LEFT HEART CATH AND CORONARY ANGIOGRAPHY Left 04/15/2020   Procedure: LEFT HEART CATH AND CORONARY ANGIOGRAPHY;  Surgeon: Antonette Batters, MD;  Location: [facility] INVASIVE CV LAB;  Service: Cardiovascular;  Laterality: Left;   LEFT HEART CATHETERIZATION WITH CORONARY ANGIOGRAM N/A 08/01/2012   Procedure: LEFT HEART CATHETERIZATION WITH CORONARY ANGIOGRAM;  Surgeon: Arnoldo Lapping, MD;  Location: [facility] CATH LAB;  Service: Cardiovascular;  Laterality: N/A;   RIGHT COLECTOMY  2000   crohn's disease, ileocecal resection   SHOULDER ARTHROSCOPY W/ ROTATOR CUFF REPAIR Right 05/31/2012   [facility] Deeann Fare)   US  ECHOCARDIOGRAPHY  05/2010   nl LV fxn ,EF 60%   VAGINAL HYSTERECTOMY  12/2008   [facility]VH/BSO     Current Outpatient Medications:    acetaminophen  (TYLENOL ) 500 MG tablet, Take 1,000-1,500 mg by mouth 2 (two) times daily as needed for moderate pain or headache., Disp: , Rfl:    amLODipine  (NORVASC ) 5 MG tablet, TAKE 1 TABLET (5 MG TOTAL) BY MOUTH DAILY.,  Disp: 90 tablet, Rfl: 1   ARIPiprazole  (ABILIFY ) 5 MG tablet, Take 1 tablet (5 mg total) by mouth at bedtime., Disp: 90 tablet, Rfl: 3   aspirin  EC 81 MG tablet, Take 1 tablet (81 mg total) by mouth daily. Over the counter, Disp: 30 tablet, Rfl: 3   budesonide  (ENTOCORT EC ) 3 MG 24 hr capsule, Take 3 capsules (9 mg total) by mouth daily., Disp: 90 capsule, Rfl: 2   cyanocobalamin  (VITAMIN B12) 1000 MCG/ML  injection, Inject 1 mL (1,000 mcg total) into the muscle every 30 (thirty) days., Disp: 1 mL, Rfl: 11   diazepam  (VALIUM ) 2 MG tablet, Take 1 tablet (2 mg total) by mouth every 6 (six) hours as needed (Vertigo)., Disp: 8 tablet, Rfl: 0   escitalopram  (LEXAPRO ) 20 MG tablet, Take 1 tablet (20 mg total) by mouth daily., Disp: 90 tablet, Rfl: 3   losartan  (COZAAR ) 100 MG tablet, Take 1 tablet (100 mg total) by mouth daily., Disp: 90 tablet, Rfl: 0   meclizine  (ANTIVERT ) 12.5 MG tablet, Take 1 tablet (12.5 mg total) by mouth 3 (three) times daily as needed for dizziness., Disp: 30 tablet, Rfl: 0   Melatonin 10 MG CAPS, Take 10 mg by mouth at bedtime as needed (Sleep)., Disp: , Rfl:    metoprolol  tartrate (LOPRESSOR ) 25 MG tablet, Take 1 tablet (25 mg total) by mouth 2 (two) times daily., Disp: 180 tablet, Rfl: 3   mirtazapine  (REMERON ) 7.5 MG tablet, Take 1 tablet (7.5 mg total) by mouth at bedtime., Disp: 30 tablet, Rfl: 0   omeprazole (PRILOSEC) 20 MG capsule, Take 20 mg by mouth daily., Disp: , Rfl:    potassium chloride  SA (KLOR-CON  M) 20 MEQ tablet, TAKE 1 TABLET BY MOUTH EVERY DAY, Disp: 90 tablet, Rfl: 1   rifaximin  (XIFAXAN ) 550 MG TABS tablet, Take 1 tablet (550 mg total) by mouth 3 (three) times daily., Disp: 42 tablet, Rfl: 0   rosuvastatin  (CRESTOR ) 5 MG tablet, Take 1 tablet (5 mg total) by mouth at bedtime., Disp: 90 tablet, Rfl: 3   SKYRIZI 180 MG/1.2ML SOCT, USE THE ON-BODY INJECTOR TO  ADMINISTER THE CONTENTS OF 1  CARTRIDGE SUBCUTANEOUSLY EVERY 8 WEEKS STARTING AT WEEK 12, Disp: 1.2 mL, Rfl: 5   Family History  Problem Relation Age of Onset   Stroke Mother    Hypertension Mother    Hyperlipidemia Mother    Hypertension Father    Crohn's disease Father    Cancer Sister        ovarian   Crohn's disease Sister    Crohn's disease Daughter    Ulcerative colitis Daughter    Cancer Paternal Aunt        Brain   Crohn's disease Paternal Aunt    Crohn's disease Paternal Uncle     Stroke Paternal Grandmother    Diabetes Neg Hx    Breast cancer Neg Hx      Social History   Tobacco Use   Smoking status: Former    Current packs/day: 0.00    Average packs/day: 0.3 packs/day for 10.0 years (3.3 ttl pk-yrs)    Types: Cigarettes    Start date: 11/15/2006    Quit date: 11/14/2016    Years since quitting: 7.1   Smokeless tobacco: Never   Tobacco comments:    Quit within past yr  Vaping Use   Vaping status: Never Used  Substance Use Topics   Alcohol use: Yes    Comment: occ   Drug use: No  Comment: extensive drug use, remotely    Allergies as of 01/17/2024 - Review Complete 01/17/2024  Allergen Reaction Noted   Infliximab  Dermatitis 12/10/2015   Codeine Nausea And Vomiting 05/16/2011   Flagyl  [metronidazole ] Nausea And Vomiting 07/15/2014   Hydrocodone  Nausea And Vomiting 11/13/2012   Ibuprofen Other (See Comments) 05/23/2012   Remeron  [mirtazapine ] Other (See Comments) 10/13/2011    Review of Systems:    All systems reviewed and negative except where noted in HPI.   Physical Exam:  BP 128/74 (BP Location: Left Arm, Patient Position: Sitting, Cuff Size: Normal)   Pulse 60   Temp 98.3 F (36.8 C) (Oral)   Wt 165 lb (74.8 kg)   BMI 31.18 kg/m  No LMP recorded. Patient has had a hysterectomy.  General:   Alert,  Well-developed, well-nourished, pleasant and cooperative in NAD Head:  Normocephalic and atraumatic. Eyes:  Sclera clear, no icterus.   Conjunctiva pink. Ears:  Normal auditory acuity. Nose:  No deformity, discharge, or lesions. Mouth:  No deformity or lesions,oropharynx pink & moist. Neck:  Supple; no masses or thyromegaly. Lungs:  Respirations even and unlabored.  Clear throughout to auscultation.   No wheezes, crackles, or rhonchi. No acute distress. Heart:  Regular rate and rhythm; no murmurs, clicks, rubs, or gallops. Abdomen:  Normal bowel sounds. Soft, non-tender and mildly distended without masses, hepatosplenomegaly or hernias  noted.  No guarding or rebound tenderness.   Rectal: Not performed Msk:  Symmetrical without gross deformities. Good, equal movement & strength bilaterally. Pulses:  Normal pulses noted. Extremities:  No clubbing or edema.  No cyanosis. Neurologic:  Alert and oriented x3;  grossly normal neurologically. Skin:  Intact without significant lesions or rashes. No jaundice. Psych:  Alert and cooperative. Normal mood and affect.  Imaging Studies: Reviewed  Assessment and Plan:   [name] is a 68 y.o. female with history of small bowel Crohn's with obstruction, s/p ileocecal resection in 2000, recurrent obstruction, status post second resection in 2016, previously treated with Cimzia, Remicade , Imuran and most recently Entyvio .  Patient self discontinued Entyvio  in June 2023 as she was concerned about skin lesions.  Postop recurrence of small bowel Crohn's: fecal calprotectin levels are normal Colonoscopy in 09/2022 revealed moderate active chronic enterocolitis at the anastomosis.  Initiated on Skyrizi, currently on maintenance dose every 8 weeks Check LFTs, iron panel, B12 levels, vitamin D  levels MR enterography did not reveal active inflammation in the neoterminal ileum Empirically treated with Xifaxan  for bacterial overgrowth in the past, repeat treatment due to ongoing symptoms of abdominal cramps, bloating, nausea Discussed about repeat colonoscopy sometime in mid summer this year to reassess severity of disease in response to Skyrizi  B12 deficiency anemia, continue B12 injection once a month Check B12 levels  Up-to-date with immunization  Follow up in 6 months Inform patient about me leaving practice and joining [place] clinic.  She expressed interested to follow me at [facility] clinic.   Karma Oz, MD

## 2024-01-25 ENCOUNTER — Ambulatory Visit (INDEPENDENT_AMBULATORY_CARE_PROVIDER_SITE_OTHER)

## 2024-01-25 ENCOUNTER — Encounter: Payer: Self-pay | Admitting: Gastroenterology

## 2024-01-25 ENCOUNTER — Encounter: Payer: Self-pay | Admitting: Nurse Practitioner

## 2024-01-25 DIAGNOSIS — E538 Deficiency of other specified B group vitamins: Secondary | ICD-10-CM

## 2024-01-25 MED ORDER — CYANOCOBALAMIN 1000 MCG/ML IJ SOLN
1000.0000 ug | Freq: Once | INTRAMUSCULAR | Status: AC
Start: 2024-01-25 — End: 2024-01-25
  Administered 2024-01-25: 1000 ug via INTRAMUSCULAR

## 2024-01-25 NOTE — Progress Notes (Signed)
 Patient presented for B 12 injection to right deltoid, patient voiced no concerns nor showed any signs of distress during injection.

## 2024-01-26 ENCOUNTER — Other Ambulatory Visit: Payer: Self-pay | Admitting: Nurse Practitioner

## 2024-01-26 DIAGNOSIS — I10 Essential (primary) hypertension: Secondary | ICD-10-CM

## 2024-01-26 MED ORDER — METOPROLOL TARTRATE 25 MG PO TABS: 12.5000 mg | ORAL_TABLET | Freq: Two times a day (BID) | ORAL | 3 refills | Status: DC

## 2024-01-26 NOTE — Telephone Encounter (Signed)
 Your request has been approved [id number] Xifaxan  550MG  OR TABS Quantity:42

## 2024-02-10 DIAGNOSIS — F32A Depression, unspecified: Secondary | ICD-10-CM | POA: Diagnosis not present

## 2024-02-10 DIAGNOSIS — F419 Anxiety disorder, unspecified: Secondary | ICD-10-CM | POA: Diagnosis not present

## 2024-02-10 DIAGNOSIS — G4733 Obstructive sleep apnea (adult) (pediatric): Secondary | ICD-10-CM | POA: Diagnosis not present

## 2024-02-14 ENCOUNTER — Ambulatory Visit: Admitting: Nurse Practitioner

## 2024-02-14 VITALS — BP 100/60 | HR 54 | Temp 98.3°F | Ht 61.0 in | Wt 164.8 lb

## 2024-02-14 DIAGNOSIS — F32A Depression, unspecified: Secondary | ICD-10-CM

## 2024-02-14 DIAGNOSIS — E785 Hyperlipidemia, unspecified: Secondary | ICD-10-CM

## 2024-02-14 DIAGNOSIS — E538 Deficiency of other specified B group vitamins: Secondary | ICD-10-CM

## 2024-02-14 DIAGNOSIS — K50919 Crohn's disease, unspecified, with unspecified complications: Secondary | ICD-10-CM

## 2024-02-14 DIAGNOSIS — I10 Essential (primary) hypertension: Secondary | ICD-10-CM

## 2024-02-14 DIAGNOSIS — F419 Anxiety disorder, unspecified: Secondary | ICD-10-CM | POA: Diagnosis not present

## 2024-02-14 DIAGNOSIS — E559 Vitamin D deficiency, unspecified: Secondary | ICD-10-CM | POA: Diagnosis not present

## 2024-02-14 DIAGNOSIS — D649 Anemia, unspecified: Secondary | ICD-10-CM | POA: Diagnosis not present

## 2024-02-14 LAB — LIPID PANEL
Cholesterol: 103 mg/dL (ref 0–200)
HDL: 50.4 mg/dL (ref >39.00)
LDL Cholesterol: 37 mg/dL (ref 0–99)
NonHDL: 52.2
Total CHOL/HDL Ratio: 2
Triglycerides: 76 mg/dL (ref 0.0–149.0)
VLDL: 15.2 mg/dL (ref 0.0–40.0)

## 2024-02-14 LAB — VITAMIN D 25 HYDROXY (VIT D DEFICIENCY, FRACTURES): VITD: 33.98 ng/mL (ref 30.00–100.00)

## 2024-02-14 LAB — CBC WITH DIFFERENTIAL/PLATELET
Basophils Absolute: 0.1 10*3/uL (ref 0.0–0.1)
Basophils Relative: 1.4 % (ref 0.0–3.0)
Eosinophils Absolute: 0.2 10*3/uL (ref 0.0–0.7)
Eosinophils Relative: 2.7 % (ref 0.0–5.0)
HCT: 34 % — ABNORMAL LOW (ref 36.0–46.0)
Hemoglobin: 11.2 g/dL — ABNORMAL LOW (ref 12.0–15.0)
Lymphocytes Relative: 41.7 % (ref 12.0–46.0)
Lymphs Abs: 3.6 10*3/uL (ref 0.7–4.0)
MCHC: 32.9 g/dL (ref 30.0–36.0)
MCV: 86.3 fl (ref 78.0–100.0)
Monocytes Absolute: 0.6 10*3/uL (ref 0.1–1.0)
Monocytes Relative: 6.7 % (ref 3.0–12.0)
Neutro Abs: 4.1 10*3/uL (ref 1.4–7.7)
Neutrophils Relative %: 47.5 % (ref 43.0–77.0)
Platelets: 162 10*3/uL (ref 150.0–400.0)
RBC: 3.94 Mil/uL (ref 3.87–5.11)
RDW: 16.9 % — ABNORMAL HIGH (ref 11.5–15.5)
WBC: 8.7 10*3/uL (ref 4.0–10.5)

## 2024-02-14 LAB — IBC + FERRITIN
Ferritin: 28.8 ng/mL (ref 10.0–291.0)
Iron: 150 ug/dL — ABNORMAL HIGH (ref 42–145)
Saturation Ratios: 42.2 % (ref 20.0–50.0)
TIBC: 355.6 ug/dL (ref 250.0–450.0)
Transferrin: 254 mg/dL (ref 212.0–360.0)

## 2024-02-14 LAB — COMPREHENSIVE METABOLIC PANEL WITH GFR
ALT: 20 U/L (ref 0–35)
AST: 44 U/L — ABNORMAL HIGH (ref 0–37)
Albumin: 3.4 g/dL — ABNORMAL LOW (ref 3.5–5.2)
Alkaline Phosphatase: 150 U/L — ABNORMAL HIGH (ref 39–117)
BUN: 11 mg/dL (ref 6–23)
CO2: 28 meq/L (ref 19–32)
Calcium: 9 mg/dL (ref 8.4–10.5)
Chloride: 109 meq/L (ref 96–112)
Creatinine, Ser: 0.88 mg/dL (ref 0.40–1.20)
GFR: 68.57 mL/min (ref >60.00)
Glucose, Bld: 93 mg/dL (ref 70–99)
Potassium: 4.1 meq/L (ref 3.5–5.1)
Sodium: 141 meq/L (ref 135–145)
Total Bilirubin: 0.9 mg/dL (ref 0.2–1.2)
Total Protein: 6.7 g/dL (ref 6.0–8.3)

## 2024-02-14 LAB — VITAMIN B12: Vitamin B-12: 420 pg/mL (ref 211–911)

## 2024-02-14 LAB — TSH: TSH: 2.36 u[IU]/mL (ref 0.35–5.50)

## 2024-02-14 NOTE — Progress Notes (Unsigned)
 Leron Glance, NP-C Phone: [phone]  Sharon [name] is a 68 y.o. female who presents today for follow up.   Discussed the use of AI scribe software for clinical note transcription with the patient, who gave verbal consent to proceed.  History of Present Illness   Sharon [name] is a 68 year old female with hypertension and Crohn's disease who presents for a follow-up visit.  Her blood pressure is well-controlled with home monitoring showing a reading of 104/60 mmHg this morning. She is taking amlodipine , losartan , and metoprolol  (half tablet twice a day), along with a potassium supplement. No symptoms of chest pain, shortness of breath, or dizziness. She experiences swelling in her ankle and leg, which worsens with activity and improves with rest, sometimes leading to numbness from the knee down and significant knee pain by the end of the day. No redness associated with the swelling.  She has Crohn's disease and started Skyrizi approximately 4-6 months ago, which has been helping significantly.  She experiences anxiety and unease, feeling unable to relax except when sleeping. She takes Abilify , Lexapro , and mirtazapine  (as needed) for mood stabilization. She feels good in the mornings after taking her medication but becomes more anxious in the afternoons. She has an upcoming appointment with her psychiatrist.  She has not completed recent lab work due to insurance issues. She takes a vitamin D  supplement and had a mammogram earlier this year.      Social History   Tobacco Use  Smoking Status Former   Current packs/day: 0.00   Average packs/day: 0.3 packs/day for 10.0 years (3.3 ttl pk-yrs)   Types: Cigarettes   Start date: 11/15/2006   Quit date: 11/14/2016   Years since quitting: 7.2  Smokeless Tobacco Never  Tobacco Comments   Quit within past yr    Current Outpatient Medications on File Prior to Visit  Medication Sig Dispense Refill   acetaminophen  (TYLENOL ) 500 MG tablet  Take 1,000-1,500 mg by mouth 2 (two) times daily as needed for moderate pain or headache.     amLODipine  (NORVASC ) 5 MG tablet TAKE 1 TABLET (5 MG TOTAL) BY MOUTH DAILY. 90 tablet 1   aspirin  EC 81 MG tablet Take 1 tablet (81 mg total) by mouth daily. Over the counter 30 tablet 3   cyanocobalamin  (VITAMIN B12) 1000 MCG/ML injection Inject 1 mL (1,000 mcg total) into the muscle every 30 (thirty) days. 1 mL 11   losartan  (COZAAR ) 100 MG tablet Take 1 tablet (100 mg total) by mouth daily. 90 tablet 0   meclizine  (ANTIVERT ) 12.5 MG tablet Take 1 tablet (12.5 mg total) by mouth 3 (three) times daily as needed for dizziness. 30 tablet 0   metoprolol  tartrate (LOPRESSOR ) 25 MG tablet Take 0.5 tablets (12.5 mg total) by mouth 2 (two) times daily. 90 tablet 3   omeprazole (PRILOSEC) 20 MG capsule Take 20 mg by mouth daily.     potassium chloride  SA (KLOR-CON  M) 20 MEQ tablet TAKE 1 TABLET BY MOUTH EVERY DAY 90 tablet 1   rosuvastatin  (CRESTOR ) 5 MG tablet Take 1 tablet (5 mg total) by mouth at bedtime. 90 tablet 3   SKYRIZI 180 MG/1.2ML SOCT USE THE ON-BODY INJECTOR TO  ADMINISTER THE CONTENTS OF 1  CARTRIDGE SUBCUTANEOUSLY EVERY 8 WEEKS STARTING AT WEEK 12 1.2 mL 5   No current facility-administered medications on file prior to visit.     ROS see history of present illness  Objective  Physical Exam Vitals:   02/14/24 1102  BP: 100/60  Pulse: (!) 54  Temp: 98.3 F (36.8 C)  SpO2: 94%    BP Readings from Last 3 Encounters:  02/14/24 100/60  01/17/24 128/74  12/22/23 (!) 146/84   Wt Readings from Last 3 Encounters:  02/14/24 164 lb 12.8 oz (74.8 kg)  01/17/24 165 lb (74.8 kg)  01/10/24 157 lb (71.2 kg)    Physical Exam Constitutional:      General: She is not in acute distress.    Appearance: Normal appearance.  HENT:     Head: Normocephalic.   Cardiovascular:     Rate and Rhythm: Normal rate and regular rhythm.     Heart sounds: Normal heart sounds.  Pulmonary:     Effort:  Pulmonary effort is normal.     Breath sounds: Normal breath sounds.   Musculoskeletal:     Right lower leg: No edema.     Left lower leg: No edema.   Skin:    General: Skin is warm and dry.   Neurological:     General: No focal deficit present.     Mental Status: She is alert.   Psychiatric:        Mood and Affect: Mood normal.        Behavior: Behavior normal.      Assessment/Plan: Please see individual problem list.  Essential hypertension Assessment & Plan: Well-controlled with current medications. Ankle swelling resolves with rest. Continue amlodipine , losartan , and metoprolol . Monitor blood pressure at home. Consider compression stockings for prolonged standing. Check CMP.   Orders: -     Comprehensive metabolic panel with GFR  Anxiety and depression Assessment & Plan: Managed with Abilify , mirtazapine  as needed, and Lexapro . No significant fatigue noted from medications. Continue Abilify , mirtazapine  as needed, and Lexapro . Follow up with psychiatrist on Monday.  Orders: -     TSH  Crohn's disease with complication, unspecified gastrointestinal tract location [facility]) Assessment & Plan: Managed with Skyrizi, which is effective with symptom improvement. Continue Skyrizi as prescribed. Follow up with GI as scheduled.    Vitamin B12 deficiency Assessment & Plan: Monthly B12 injections. Check B12 level.   Orders: -     Vitamin B12  Hyperlipidemia, unspecified hyperlipidemia type Assessment & Plan: Managed with Crestor  5 mg daily. Continue. Check lipid panel.   Orders: -     Lipid panel  Anemia, unspecified type -     CBC with Differential/Platelet -     IBC + Ferritin  Vitamin D  deficiency -     VITAMIN D  25 Hydroxy (Vit-D Deficiency, Fractures)     Return in about 6 months (around 08/15/2024) for Follow up.   Leron Glance, NP-C [facility]

## 2024-02-14 NOTE — Patient Instructions (Signed)
 YOUR BONE DENISTY SCAN (dexa)  IS DUE, PLEASE CALL AND GET THIS SCHEDULED! [facility] - call [phone]

## 2024-02-18 ENCOUNTER — Encounter: Payer: Self-pay | Admitting: Internal Medicine

## 2024-02-19 ENCOUNTER — Telehealth (HOSPITAL_COMMUNITY): Admitting: Family

## 2024-02-19 DIAGNOSIS — F419 Anxiety disorder, unspecified: Secondary | ICD-10-CM

## 2024-02-19 DIAGNOSIS — F32A Depression, unspecified: Secondary | ICD-10-CM | POA: Diagnosis not present

## 2024-02-19 MED ORDER — ESCITALOPRAM OXALATE 20 MG PO TABS: 20.0000 mg | ORAL_TABLET | Freq: Every day | ORAL | 3 refills | Status: DC

## 2024-02-19 MED ORDER — HYDROXYZINE HCL 10 MG PO TABS: 10.0000 mg | ORAL_TABLET | Freq: Three times a day (TID) | ORAL | 0 refills | Status: DC | PRN

## 2024-02-19 MED ORDER — ARIPIPRAZOLE 5 MG PO TABS: 5.0000 mg | ORAL_TABLET | Freq: Every day | ORAL | 3 refills | Status: DC

## 2024-02-19 NOTE — Progress Notes (Signed)
 Virtual Visit via Video Note  I connected with Sharon [name] on 02/19/24 at  3:30 PM EDT by a video enabled telemedicine application and verified that I am speaking with the correct person using two identifiers.  Location: Patient: Home Provider: Office   I discussed the limitations of evaluation and management by telemedicine and the availability of in person appointments. The patient expressed understanding and agreed to proceed.    I discussed the assessment and treatment plan with the patient. The patient was provided an opportunity to ask questions and all were answered. The patient agreed with the plan and demonstrated an understanding of the instructions.   The patient was advised to call back or seek an in-person evaluation if the symptoms worsen or if the condition fails to improve as anticipated.  I provided 18 minutes of non-face-to-face time during this encounter.   [name], NP   [facility] MD/PA/NP OP Progress Note  02/19/2024 4:09 PM Sharon [name]  MRN:  [id number]  Chief Complaint: Sharon [name] stated It's been a while, not much as changed  HPI: Sharon [name] is a 68 year old female who presents for medication management follow-up appointment.  Carries a diagnosis related to depression and anxiety.  She is currently prescribed Abilify  5 mg daily and Lexapro  20 mg which she reports she has been taking and tolerating well.  States She Has Not psychiatry outpatient follow-up appointment due to multiple trips to primary care provider.  States she had a recent flareup with her Crohn's and her primary care provider has been managing her psychotropic medications.  Does report some ongoing underlying anxiety symptoms.  However states she has noticed that Abilify  has helped with her symptoms.  She denied suicidal or homicidal ideations.  Denies auditory visual hallucinations.  Sharon [name] reported she has discontinued Remeron  due to lingering hangover effect.  States that her sleeping  hygiene has improved.  Discussed initiating 10 mg of hydroxyzine p.o. 3 times daily as needed for underlying anxiety symptoms.  She appeared receptive to plan.  Patient to follow-up 2 months for medication adherence/tolerability.  Support encouragement reassurance was provided.   Visit Diagnosis:    ICD-10-CM   1. Anxiety and depression  F41.9 ARIPiprazole  (ABILIFY ) 5 MG tablet   F32.A escitalopram  (LEXAPRO ) 20 MG tablet      Past Psychiatric History:   Past Medical History:  Past Medical History:  Diagnosis Date   Anxiety and depression    Arthritis    B12 deficiency    pernicious anemia - monthly b12 shots   Cervical cancer (HCC) 12/2008   s/p hysterectomy   Crohn's disease (HCC) 2000   h/o stenotic crohn's ileitis, active in TI s/p resections 2000, 2016 PPD neg ([facility] Dr. Celestia); has been on cimzia, remicade , , entocort, now stable on Entyvio  ([place] [facility])   Genital warts    GERD (gastroesophageal reflux disease)    severe, daily sxs if off PPI   Hepatic steatosis 04/2011   diffuse on CT, 5mm gallbladder polyp   History of anemia    attributed to crohn's   History of chicken pox    History of syphilis 1980s   HTN (hypertension)    Migraines    and frequent other headaches (sinus or stress)   Scalp psoriasis    Sensorineural hearing loss of both ears 12/2012   high freq   Takotsubo syndrome 07/2012   cath 08/01/12, normal coronaries, LVEF 65%   TIA (transient ischemic attack) 05/2010   at [facility] -  TIA vs complex migraine (w/u negative - carotids, echo, TC doppler, and MRI normal)   Tobacco abuse     Past Surgical History:  Procedure Laterality Date   APPENDECTOMY  1998   CARDIAC CATHETERIZATION  07/2012   normal LV fxn, widely patent coronaries   CERVICAL BIOPSY  W/ LOOP ELECTRODE EXCISION  10/2008   COLON RESECTION  10/2014   removal of scar tissue colon from prior surgery ([place] [facility])   COLONOSCOPY  10/2008   ileo-colonic anastomosis, ielocolonic  crohn's   COLONOSCOPY  05/2011   ileo-colonic anastomosis normal, normal mucosa   COLONOSCOPY  10/15/2012   focal inflammation at anastomosis, no active crohn's, planning MR enterograph [facility])   COLONOSCOPY  09/2014   [place]   COLONOSCOPY WITH PROPOFOL  N/A 10/10/2022   Procedure: COLONOSCOPY WITH PROPOFOL ;  Surgeon: Unk Corinn Skiff, MD;  Location: [facility] ENDOSCOPY;  Service: Gastroenterology;  Laterality: N/A;   ESOPHAGOGASTRODUODENOSCOPY  05/2011   nl esophagus, gastritis, nl duodenum   ESOPHAGOGASTRODUODENOSCOPY (EGD) WITH PROPOFOL  N/A 10/10/2022   Procedure: ESOPHAGOGASTRODUODENOSCOPY (EGD) WITH PROPOFOL ;  Surgeon: Unk Corinn Skiff, MD;  Location: [facility] ENDOSCOPY;  Service: Gastroenterology;  Laterality: N/A;   KNEE SURGERY Left    LEFT HEART CATH AND CORONARY ANGIOGRAPHY Left 04/15/2020   Procedure: LEFT HEART CATH AND CORONARY ANGIOGRAPHY;  Surgeon: Florencio Cara BIRCH, MD;  Location: [facility] INVASIVE CV LAB;  Service: Cardiovascular;  Laterality: Left;   LEFT HEART CATHETERIZATION WITH CORONARY ANGIOGRAM N/A 08/01/2012   Procedure: LEFT HEART CATHETERIZATION WITH CORONARY ANGIOGRAM;  Surgeon: Ozell Fell, MD;  Location: [facility] CATH LAB;  Service: Cardiovascular;  Laterality: N/A;   RIGHT COLECTOMY  2000   crohn's disease, ileocecal resection   SHOULDER ARTHROSCOPY W/ ROTATOR CUFF REPAIR Right 05/31/2012   [facility])   US  ECHOCARDIOGRAPHY  05/2010   nl LV fxn ,EF 60%   VAGINAL HYSTERECTOMY  12/2008   [facility]VH/BSO    Family Psychiatric History:   Family History:  Family History  Problem Relation Age of Onset   Stroke Mother    Hypertension Mother    Hyperlipidemia Mother    Hypertension Father    Crohn's disease Father    Cancer Sister        ovarian   Crohn's disease Sister    Crohn's disease Daughter    Ulcerative colitis Daughter    Cancer Paternal Aunt        Brain   Crohn's disease Paternal Aunt    Crohn's disease Paternal Uncle    Stroke  Paternal Grandmother    Diabetes Neg Hx    Breast cancer Neg Hx     Social History:  Social History   Socioeconomic History   Marital status: Divorced    Spouse name: Not on file   Number of children: 3   Years of education: Not on file   Highest education level: Bachelor's degree (e.g., BA, AB, BS)  Occupational History   Occupation: Nursing Tech    Comment: [employer]  Tobacco Use   Smoking status: Former    Current packs/day: 0.00    Average packs/day: 0.3 packs/day for 10.0 years (3.3 ttl pk-yrs)    Types: Cigarettes    Start date: 11/15/2006    Quit date: 11/14/2016    Years since quitting: 7.2   Smokeless tobacco: Never   Tobacco comments:    Quit within past yr  Vaping Use   Vaping status: Never Used  Substance and Sexual Activity   Alcohol use: Yes  Comment: occ   Drug use: No    Comment: extensive drug use, remotely   Sexual activity: Not Currently    Birth control/protection: Surgical, Post-menopausal  Other Topics Concern   Not on file  Social History Narrative   Not on file   Social Drivers of Health   Financial Resource Strain: Low Risk  (01/10/2024)   Overall Financial Resource Strain (CARDIA)    Difficulty of Paying Living Expenses: Not hard at all  Food Insecurity: No Food Insecurity (01/10/2024)   Hunger Vital Sign    Worried About Running Out of Food in the Last Year: Never true    Ran Out of Food in the Last Year: Never true  Transportation Needs: No Transportation Needs (01/10/2024)   PRAPARE - Administrator, Civil Service (Medical): No    Lack of Transportation (Non-Medical): No  Physical Activity: Inactive (01/10/2024)   Exercise Vital Sign    Days of Exercise per Week: 0 days    Minutes of Exercise per Session: 0 min  Stress: Stress Concern Present (01/10/2024)   Sharon [name] of Occupational Health - Occupational Stress Questionnaire    Feeling of Stress : To some extent  Social Connections: Moderately Isolated (01/10/2024)    Social Connection and Isolation Panel    Frequency of Communication with Friends and Family: More than three times a week    Frequency of Social Gatherings with Friends and Family: More than three times a week    Attends Religious Services: More than 4 times per year    Active Member of [employer] or Organizations: No    Attends Banker Meetings: Never    Marital Status: Divorced    Allergies:  Allergies  Allergen Reactions   Infliximab  Dermatitis   Codeine Nausea And Vomiting   Flagyl  [Metronidazole ] Nausea And Vomiting   Hydrocodone  Nausea And Vomiting   Ibuprofen Other (See Comments)    Pt states that she is unable to take due to her Crohn's disease.   Remeron  [Mirtazapine ] Other (See Comments)    Reaction:  Makes pt lethargic     Metabolic Disorder Labs: Lab Results  Component Value Date   HGBA1C 5.5 05/26/2023   MPG 108 08/01/2012   MPG 103 06/09/2010   No results found for: PROLACTIN Lab Results  Component Value Date   CHOL 103 02/14/2024   TRIG 76.0 02/14/2024   HDL 50.40 02/14/2024   CHOLHDL 2 02/14/2024   VLDL 15.2 02/14/2024   LDLCALC 37 02/14/2024   LDLCALC 64 05/26/2023   Lab Results  Component Value Date   TSH 2.36 02/14/2024   TSH 1.64 05/26/2023    Therapeutic Level Labs: No results found for: LITHIUM No results found for: VALPROATE No results found for: CBMZ  Current Medications: Current Outpatient Medications  Medication Sig Dispense Refill   hydrOXYzine (ATARAX) 10 MG tablet Take 1 tablet (10 mg total) by mouth 3 (three) times daily as needed. 30 tablet 0   acetaminophen  (TYLENOL ) 500 MG tablet Take 1,000-1,500 mg by mouth 2 (two) times daily as needed for moderate pain or headache.     amLODipine  (NORVASC ) 5 MG tablet TAKE 1 TABLET (5 MG TOTAL) BY MOUTH DAILY. 90 tablet 1   ARIPiprazole  (ABILIFY ) 5 MG tablet Take 1 tablet (5 mg total) by mouth at bedtime. 90 tablet 3   aspirin  EC 81 MG tablet Take 1 tablet (81 mg total) by  mouth daily. Over the counter 30 tablet 3   budesonide  (ENTOCORT EC )  3 MG 24 hr capsule Take 3 capsules (9 mg total) by mouth daily. 90 capsule 2   cyanocobalamin  (VITAMIN B12) 1000 MCG/ML injection Inject 1 mL (1,000 mcg total) into the muscle every 30 (thirty) days. 1 mL 11   escitalopram  (LEXAPRO ) 20 MG tablet Take 1 tablet (20 mg total) by mouth daily. 90 tablet 3   losartan  (COZAAR ) 100 MG tablet Take 1 tablet (100 mg total) by mouth daily. 90 tablet 0   meclizine  (ANTIVERT ) 12.5 MG tablet Take 1 tablet (12.5 mg total) by mouth 3 (three) times daily as needed for dizziness. 30 tablet 0   Melatonin 10 MG CAPS Take 10 mg by mouth at bedtime as needed (Sleep).     metoprolol  tartrate (LOPRESSOR ) 25 MG tablet Take 0.5 tablets (12.5 mg total) by mouth 2 (two) times daily. 90 tablet 3   mirtazapine  (REMERON ) 7.5 MG tablet Take 1 tablet (7.5 mg total) by mouth at bedtime. 30 tablet 0   omeprazole (PRILOSEC) 20 MG capsule Take 20 mg by mouth daily.     potassium chloride  SA (KLOR-CON  M) 20 MEQ tablet TAKE 1 TABLET BY MOUTH EVERY DAY 90 tablet 1   rifaximin  (XIFAXAN ) 550 MG TABS tablet Take 1 tablet (550 mg total) by mouth 3 (three) times daily. 42 tablet 0   rosuvastatin  (CRESTOR ) 5 MG tablet Take 1 tablet (5 mg total) by mouth at bedtime. 90 tablet 3   SKYRIZI 180 MG/1.2ML SOCT USE THE ON-BODY INJECTOR TO  ADMINISTER THE CONTENTS OF 1  CARTRIDGE SUBCUTANEOUSLY EVERY 8 WEEKS STARTING AT WEEK 12 1.2 mL 5   No current facility-administered medications for this visit.     Musculoskeletal:   Psychiatric Specialty Exam: Review of Systems  Psychiatric/Behavioral:  Negative for sleep disturbance (improved) and suicidal ideas. The patient is nervous/anxious.   All other systems reviewed and are negative.   There were no vitals taken for this visit.There is no height or weight on file to calculate BMI.  General Appearance: Casual  Eye Contact:  Good  Speech:  Clear and Coherent  Volume:  Normal   Mood:  Anxious and Depressed  Affect:  Congruent  Thought Process:  Coherent  Orientation:  Full (Time, Place, and Person)  Thought Content: Logical   Suicidal Thoughts:  No  Homicidal Thoughts:  No  Memory:  Immediate;   Good Recent;   Good  Judgement:  Good  Insight:  Fair  Psychomotor Activity:  Normal  Concentration:  Concentration: Good  Recall:  Good  Fund of Knowledge: Good  Language: Good  Akathisia:  No  Handed:  Right  AIMS (if indicated): not done  Assets:  Communication Skills Desire for Improvement Social Support  ADL's:  Intact  Cognition: WNL  Sleep:  Good   Screenings: GAD-7    Flowsheet Row Office Visit from 02/14/2024 in [facility] [employer] Visit from 06/23/2023 in [facility] [employer] Visit from 05/26/2023 in [facility] [employer] Visit from 07[facility]19/2022 in [facility] at [employer] Visit from 03/27/2018 in [facility]  Total GAD-7 Score 11 6 12 4 8    [employer]    Flowsheet Row Office Visit from 02/14/2024 in [facility] [employer] Clinical Support from 01/10/2024 in [facility] [employer] Visit from 07/10/2023 in [facility] Office Visit from 06/23/2023 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 [employer] Visit from 05/26/2023 in [facility] Total Score 1 0 2 0 3  PHQ-9 Total Score 4 1 8 6 17    Flowsheet Row ED from 11/08/2023 in [facility] Emergency Department at [facility] ED from 03/16/2023 in [facility] Emergency Department at [facility] Admission (Discharged) from 10/10/2022 in [facility]  C-SSRS RISK CATEGORY No Risk High Risk No Risk     Assessment and Plan: Sharon  [name] 68 year old Caucasian female presents for medication management follow-up appointment.  Reports a overall her mood has stabilized.  States some lingering anxiety symptoms however her symptoms are not unmanageable.  Reports taking Lexapro  and Abilify  as directed reported medication had been previously refilled by primary care provider.  Discussed initiating low-dose hydroxyzine for underlying reported anxiety symptoms.  She was receptive to plan.  Patient to continue as indicated support encouragement reassurance was provided.  Follow-up 2 months.   Collaboration of Care: Collaboration of Care: Medication Management AEB initiated hydroxyzine 10 mg p.o. 3 times daily as needed  Patient/Guardian was advised Release of Information must be obtained prior to any record release in order to collaborate their care with an outside provider. Patient/Guardian was advised if they have not already done so to contact the registration department to sign all necessary forms in order for us  to release information regarding their care.   Consent: Patient/Guardian gives verbal consent for treatment and assignment of benefits for services provided during this visit. Patient/Guardian expressed understanding and agreed to proceed.    [name], NP 02/19/2024, 4:09 PM

## 2024-02-20 ENCOUNTER — Encounter: Payer: Self-pay | Admitting: Nurse Practitioner

## 2024-02-20 NOTE — Assessment & Plan Note (Signed)
 Monthly B12 injections. Check B12 level.

## 2024-02-20 NOTE — Assessment & Plan Note (Signed)
 Managed with Crestor  5 mg daily. Continue. Check lipid panel.

## 2024-02-20 NOTE — Assessment & Plan Note (Signed)
 Well-controlled with current medications. Ankle swelling resolves with rest. Continue amlodipine , losartan , and metoprolol . Monitor blood pressure at home. Consider compression stockings for prolonged standing. Check CMP.

## 2024-02-20 NOTE — Assessment & Plan Note (Signed)
 Managed with Abilify , mirtazapine  as needed, and Lexapro . No significant fatigue noted from medications. Continue Abilify , mirtazapine  as needed, and Lexapro . Follow up with psychiatrist on Monday.

## 2024-02-20 NOTE — Assessment & Plan Note (Signed)
 Managed with Skyrizi, which is effective with symptom improvement. Continue Skyrizi as prescribed. Follow up with GI as scheduled.

## 2024-02-21 ENCOUNTER — Ambulatory Visit: Admitting: Internal Medicine

## 2024-02-21 ENCOUNTER — Encounter: Payer: Self-pay | Admitting: Internal Medicine

## 2024-02-21 VITALS — BP 112/70 | HR 55 | Temp 98.3°F | Ht 63.0 in | Wt 161.6 lb

## 2024-02-21 DIAGNOSIS — G4733 Obstructive sleep apnea (adult) (pediatric): Secondary | ICD-10-CM | POA: Diagnosis not present

## 2024-02-21 NOTE — Progress Notes (Signed)
 Name: Sharon [name] MRN: [id number] DOB: 1955-09-12    CHIEF COMPLAINT:  Home sleep study February 2025 AHI between 9 and 17 Follow-up assessment for OSA Patient started on auto CPAP    HISTORY OF PRESENT ILLNESS:  Discussed sleep data and reviewed with patient.  Encouraged proper weight management.  Discussed driving precautions and its relationship with hypersomnolence.  Discussed sleep hygiene, and benefits of a fixed sleep waked time.  The importance of getting eight or more hours of sleep discussed with patient.  Discussed limiting the use of the computer and television before bedtime.  Decrease naps during the day, so night time sleep will become enhanced.  Limit caffeine , and sleep deprivation.   Patient uses and benefits from therapy Using CPAP nightly and with naps Pressure setting is comfortable and is sleeping well. Great compliance with days and greater than 4 hours AHI reduced to 0.9 Auto CPAP 4-12  No exacerbation at this time No evidence of heart failure at this time No evidence or signs of infection at this time No respiratory distress No fevers, chills, nausea, vomiting, diarrhea No evidence of lower extremity edema No evidence hemoptysis  Chest x-ray reviewed 2022 No effusions no pneumonia No significant findings  PAST MEDICAL HISTORY :   has a past medical history of Anxiety and depression, Arthritis, B12 deficiency, Cervical cancer (HCC) (12/2008), Crohn's disease (HCC) (2000), Genital warts, GERD (gastroesophageal reflux disease), Hepatic steatosis (04/2011), History of anemia, History of chicken pox, History of syphilis (1980s), HTN (hypertension), Migraines, Scalp psoriasis, Sensorineural hearing loss of both ears (12/2012), Takotsubo syndrome (07/2012), TIA (transient ischemic attack) (05/2010), and Tobacco abuse.  has a past surgical history that includes Appendectomy (1998); Right colectomy (2000); US  ECHOCARDIOGRAPHY (05/2010); Colonoscopy  (10/2008); Colonoscopy (05/2011); Esophagogastroduodenoscopy (05/2011); Cervical biopsy w/ loop electrode excision (10/2008); Vaginal hysterectomy (12/2008); Cardiac catheterization (07/2012); Colonoscopy (10/15/2012); Shoulder arthroscopy w/ rotator cuff repair (Right, 05/31/2012); left heart catheterization with coronary angiogram (N/A, 08/01/2012); Colonoscopy (09/2014); Colon resection (10/2014); Knee surgery (Left); LEFT HEART CATH AND CORONARY ANGIOGRAPHY (Left, 04/15/2020); Colonoscopy with propofol  (N/A, 10/10/2022); and Esophagogastroduodenoscopy (egd) with propofol  (N/A, 10/10/2022). Prior to Admission medications   Medication Sig Start Date End Date Taking? Authorizing Provider  acetaminophen  (TYLENOL ) 500 MG tablet Take 1,000-1,500 mg by mouth 2 (two) times daily as needed for moderate pain or headache.   Yes [provider]  ARIPiprazole  (ABILIFY ) 5 MG tablet Take 1 tablet (5 mg total) by mouth at bedtime. 05/26/23  Yes Gretel App, NP  aspirin  EC 81 MG tablet Take 1 tablet (81 mg total) by mouth daily. Over the counter 05/01/16  Yes Rai, Ripudeep K, MD  budesonide  (ENTOCORT EC ) 3 MG 24 hr capsule Take 3 capsules (9 mg total) by mouth daily. 07/05/23  Yes Vanga, Corinn Skiff, MD  cyanocobalamin  (VITAMIN B12) 1000 MCG/ML injection Inject 1 mL (1,000 mcg total) into the muscle every 30 (thirty) days. 06/20/23  Yes Gretel App, NP  escitalopram  (LEXAPRO ) 20 MG tablet Take 1 tablet (20 mg total) by mouth daily. 05/26/23  Yes Gretel App, NP  Melatonin 10 MG CAPS Take 10 mg by mouth at bedtime as needed (Sleep).   Yes [provider]  metoprolol  tartrate (LOPRESSOR ) 25 MG tablet Take 1 tablet (25 mg total) by mouth 2 (two) times daily. 05/26/23  Yes Gretel App, NP  mirtazapine  (REMERON ) 7.5 MG tablet Take 1 tablet (7.5 mg total) by mouth at bedtime. 07/10/23  Yes Ezzard Staci SAILOR, NP  omeprazole (PRILOSEC) 20 MG capsule  Take 20 mg by mouth daily.   Yes [provider]   oxycodone  (OXY-IR) 5 MG capsule Take 5 mg by mouth every 6 (six) hours. 05/11/23  Yes [provider]  ramelteon  (ROZEREM ) 8 MG tablet Take 1 tablet (8 mg total) by mouth at bedtime. 07/14/23  Yes Ezzard Staci SAILOR, NP  rosuvastatin  (CRESTOR ) 5 MG tablet Take 1 tablet (5 mg total) by mouth at bedtime. 05/26/23  Yes Gretel App, NP   Allergies  Allergen Reactions   Infliximab  Dermatitis   Codeine Nausea And Vomiting   Flagyl  [Metronidazole ] Nausea And Vomiting   Hydrocodone  Nausea And Vomiting   Ibuprofen Other (See Comments)    Pt states that she is unable to take due to her Crohn's disease.   Remeron  [Mirtazapine ] Other (See Comments)    Reaction:  Makes pt lethargic     FAMILY HISTORY:  family history includes Cancer in her paternal aunt and sister; Crohn's disease in her daughter, father, paternal aunt, paternal uncle, and sister; Hyperlipidemia in her mother; Hypertension in her father and mother; Stroke in her mother and paternal grandmother; Ulcerative colitis in her daughter. SOCIAL HISTORY:  reports that she quit smoking about 7 years ago. Her smoking use included cigarettes. She started smoking about 17 years ago. She has a 3.3 pack-year smoking history. She has never used smokeless tobacco. She reports current alcohol use. She reports that she does not use drugs.     Review of Systems: Gen:  Denies  fever, sweats, chills weight loss  HEENT: Denies blurred vision, double vision, ear pain, eye pain, hearing loss, nose bleeds, sore throat Cardiac:  No dizziness, chest pain or heaviness, chest tightness,edema, No JVD Resp:   No cough, -sputum production, -shortness of breath,-wheezing, -hemoptysis,  Other:  All other systems negative   Physical Examination:   General Appearance: No distress  EYES PERRLA, EOM intact.   NECK Supple, No JVD Pulmonary: normal breath sounds, No wheezing.  CardiovascularNormal S1,S2.  No m/r/g.   Abdomen: Benign, Soft,  non-tender. Neurology UE/LE 5/5 strength, no focal deficits Ext pulses intact, cap refill intact ALL OTHER ROS ARE NEGATIVE   ASSESSMENT AND PLAN SYNOPSIS  68 year old pleasant white female with signs and symptoms of excessive daytime sleepiness with diagnosis of mild to moderate sleep apnea in the setting of obesity and deconditioned state AHI between 9 and 17   Assessment of OSA Previous AHI 9-17 Continue CPAP as prescribed  Excellent compliance report Reviewed compliance report in detail with patient Patient definitely benefits the use of CPAP therapy as prescribed Using CPAP nightly and with naps Pressure setting is comfortable and is sleeping well. CPAP prescription 4-12 AHI reduced to 0.9  No evidence of acute heart failure at this time No respiratory distress No fevers, chills, nausea, vomiting, diarrhea No evidence hemoptysis  Patient Instructions Continue to use CPAP every night, minimum of 4-6 hours a night.  Change equipment every 30 days or as directed by DME.  Wash your tubing with warm soap and water daily, hang to dry. Wash humidifier portion weekly. Use bottled, distilled water and change daily   Be aware of reduced alertness and do not drive or operate heavy machinery if experiencing this or drowsiness.  Exercise encouraged, as tolerated. Encouraged proper weight management.  Important to get eight or more hours of sleep  Limiting the use of the computer and television before bedtime.  Decrease naps during the day, so night time sleep will become enhanced.  Limit caffeine , and  sleep deprivation.  HTN, stroke, uncontrolled diabetes and heart failure are potential risk factors.  Risk of untreated sleep apnea including cardiac arrhthymias, stroke, DM, pulm HTN.   Obesity -recommend significant weight loss -recommend changing diet  Deconditioned state -Recommend increased daily activity and exercise   MEDICATION ADJUSTMENTS/LABS AND TESTS  ORDERED: Continue CPAP as prescribed Avoid Allergens and Irritants Avoid secondhand smoke Avoid SICK contacts Recommend  Masking  when appropriate Recommend Keep up-to-date with vaccinations    CURRENT MEDICATIONS REVIEWED AT LENGTH WITH PATIENT TODAY   Patient  satisfied with Plan of action and management. All questions answered  Follow up  6 months   I spent a total of 42 minutes reviewing chart data, face-to-face evaluation with the patient, counseling and coordination of care as detailed above.    Nickolas Alm Cellar, M.D.  [facility]  Medical Director [facility] Medical Director [facility] Cardio-Pulmonary Department

## 2024-02-21 NOTE — Patient Instructions (Signed)

## 2024-02-23 ENCOUNTER — Ambulatory Visit: Payer: Self-pay | Admitting: Nurse Practitioner

## 2024-02-23 DIAGNOSIS — R748 Abnormal levels of other serum enzymes: Secondary | ICD-10-CM

## 2024-02-23 DIAGNOSIS — D649 Anemia, unspecified: Secondary | ICD-10-CM

## 2024-02-26 ENCOUNTER — Ambulatory Visit (INDEPENDENT_AMBULATORY_CARE_PROVIDER_SITE_OTHER)

## 2024-02-26 DIAGNOSIS — E538 Deficiency of other specified B group vitamins: Secondary | ICD-10-CM | POA: Diagnosis not present

## 2024-02-26 MED ORDER — CYANOCOBALAMIN 1000 MCG/ML IJ SOLN
1000.0000 ug | Freq: Once | INTRAMUSCULAR | Status: AC
  Administered 2024-02-26: 1000 ug via INTRAMUSCULAR

## 2024-02-26 NOTE — Progress Notes (Signed)
 Pt presented for their vitamin B12 injection. Pt was identified through two identifiers. Pt tolerated shot well in their left deltoid.

## 2024-03-03 ENCOUNTER — Encounter: Payer: Self-pay | Admitting: Nurse Practitioner

## 2024-03-04 ENCOUNTER — Ambulatory Visit: Payer: Self-pay

## 2024-03-04 DIAGNOSIS — G4733 Obstructive sleep apnea (adult) (pediatric): Secondary | ICD-10-CM | POA: Diagnosis not present

## 2024-03-04 NOTE — Telephone Encounter (Signed)
  FYI Only or Action Required?: FYI only for provider.  Patient was last seen in primary care on 02/14/2024 by Gretel App, NP. Called Nurse Triage reporting Leg Swelling. Symptoms began several months ago. Interventions attempted: Nothing. Symptoms are: unchanged.  Triage Disposition: See Physician Within 24 Hours Declined dispo and made apt for Aug to see PCP.  Patient/caregiver understands and will follow disposition?: No, wishes to speak with PCP   Copied from CRM [id number]. Topic: Clinical - Red Word Triage >> Mar 04, 2024 11:33 AM Drema MATSU wrote: Red Word that prompted transfer to Nurse Triage: Patient has indentions on her left leg with swelling and a little pain. Reason for Disposition  [1] MODERATE leg swelling (e.g., swelling extends up to knees) AND [2] new-onset or worsening  Answer Assessment - Initial Assessment Questions 1. ONSET: When did the swelling start? (e.g., minutes, hours, days)     On and off for awhile now 2. LOCATION: What part of the leg is swollen?  Are both legs swollen or just one leg?     Left leg 3. SEVERITY: How bad is the swelling? (e.g., localized; mild, moderate, severe)   - Localized: Small area of swelling localized to one leg.   - MILD pedal edema: Swelling limited to foot and ankle, pitting edema < 1/4 inch (6 mm) deep, rest and elevation eliminate most or all swelling.   - MODERATE edema: Swelling of lower leg to knee, pitting edema > 1/4 inch (6 mm) deep, rest and elevation only partially reduce swelling.   - SEVERE edema: Swelling extends above knee, facial or hand swelling present.      mild 4. REDNESS: Does the swelling look red or infected?     denies 5. PAIN: Is the swelling painful to touch? If Yes, ask: How painful is it?   (Scale 1-10; mild, moderate or severe)     denies 6. FEVER: Do you have a fever? If Yes, ask: What is it, how was it measured, and when did it start?      no 7. CAUSE: What do you think is causing  the leg swelling?     no 8. MEDICAL HISTORY: Do you have a history of blood clots (e.g., DVT), cancer, heart failure, kidney disease, or liver failure?     no 9. RECURRENT SYMPTOM: Have you had leg swelling before? If Yes, ask: When was the last time? What happened that time?     no 10. OTHER SYMPTOMS: Do you have any other symptoms? (e.g., chest pain, difficulty breathing)       no 11. PREGNANCY: Is there any chance you are pregnant? When was your last menstrual period?       na  Protocols used: Leg Swelling and Edema-A-AH

## 2024-03-25 ENCOUNTER — Telehealth (HOSPITAL_COMMUNITY): Payer: Self-pay

## 2024-03-25 NOTE — Telephone Encounter (Signed)
 Patient is calling to let you know that the hydroxyzine  is not working and wants to know if you can give her something else. Patient has a follow up 8/18

## 2024-03-27 ENCOUNTER — Ambulatory Visit (INDEPENDENT_AMBULATORY_CARE_PROVIDER_SITE_OTHER)

## 2024-03-27 DIAGNOSIS — D649 Anemia, unspecified: Secondary | ICD-10-CM

## 2024-03-27 DIAGNOSIS — E538 Deficiency of other specified B group vitamins: Secondary | ICD-10-CM

## 2024-03-27 DIAGNOSIS — R748 Abnormal levels of other serum enzymes: Secondary | ICD-10-CM

## 2024-03-27 LAB — CBC WITH DIFFERENTIAL/PLATELET
Basophils Absolute: 0.1 K/uL (ref 0.0–0.1)
Basophils Relative: 0.9 % (ref 0.0–3.0)
Eosinophils Absolute: 0.2 K/uL (ref 0.0–0.7)
Eosinophils Relative: 2.9 % (ref 0.0–5.0)
HCT: 34.5 % — ABNORMAL LOW (ref 36.0–46.0)
Hemoglobin: 11.3 g/dL — ABNORMAL LOW (ref 12.0–15.0)
Lymphocytes Relative: 44.7 % (ref 12.0–46.0)
Lymphs Abs: 3.4 K/uL (ref 0.7–4.0)
MCHC: 32.9 g/dL (ref 30.0–36.0)
MCV: 89.6 fl (ref 78.0–100.0)
Monocytes Absolute: 0.5 K/uL (ref 0.1–1.0)
Monocytes Relative: 6 % (ref 3.0–12.0)
Neutro Abs: 3.4 K/uL (ref 1.4–7.7)
Neutrophils Relative %: 45.5 % (ref 43.0–77.0)
Platelets: 175 K/uL (ref 150.0–400.0)
RBC: 3.85 Mil/uL — ABNORMAL LOW (ref 3.87–5.11)
RDW: 17 % — ABNORMAL HIGH (ref 11.5–15.5)
WBC: 7.6 K/uL (ref 4.0–10.5)

## 2024-03-27 LAB — IBC + FERRITIN
Ferritin: 50.6 ng/mL (ref 10.0–291.0)
Iron: 91 ug/dL (ref 42–145)
Saturation Ratios: 31.1 % (ref 20.0–50.0)
TIBC: 292.6 ug/dL (ref 250.0–450.0)
Transferrin: 209 mg/dL — ABNORMAL LOW (ref 212.0–360.0)

## 2024-03-27 LAB — HEPATIC FUNCTION PANEL
ALT: 21 U/L (ref 0–35)
AST: 49 U/L — ABNORMAL HIGH (ref 0–37)
Albumin: 3.5 g/dL (ref 3.5–5.2)
Alkaline Phosphatase: 139 U/L — ABNORMAL HIGH (ref 39–117)
Bilirubin, Direct: 0.2 mg/dL (ref 0.0–0.3)
Total Bilirubin: 0.8 mg/dL (ref 0.2–1.2)
Total Protein: 7 g/dL (ref 6.0–8.3)

## 2024-03-27 MED ORDER — CYANOCOBALAMIN 1000 MCG/ML IJ SOLN
1000.0000 ug | Freq: Once | INTRAMUSCULAR | Status: AC
  Administered 2024-03-27: 1000 ug via INTRAMUSCULAR

## 2024-03-27 NOTE — Progress Notes (Signed)
 Patient was administered a B12 injection into her right deltoid. Patient tolerated the B12 injection well.

## 2024-03-28 ENCOUNTER — Telehealth: Payer: Self-pay

## 2024-03-28 NOTE — Telephone Encounter (Signed)
 Copied from CRM [id number]. Topic: Appointments - Scheduling Inquiry for Clinic >> Mar 28, 2024 11:34 AM Emylou G wrote: Reason for CRM: Patient had to reschedule her acute visit from 8/13.SABRA She wants to change it and I did but prefers sooner.. Can she see someone else because she goes back to work 9/2?

## 2024-03-29 ENCOUNTER — Other Ambulatory Visit: Payer: Self-pay | Admitting: Nurse Practitioner

## 2024-03-29 DIAGNOSIS — I10 Essential (primary) hypertension: Secondary | ICD-10-CM

## 2024-04-02 ENCOUNTER — Ambulatory Visit: Admitting: Family

## 2024-04-02 ENCOUNTER — Other Ambulatory Visit: Payer: Self-pay | Admitting: Nurse Practitioner

## 2024-04-02 DIAGNOSIS — E785 Hyperlipidemia, unspecified: Secondary | ICD-10-CM

## 2024-04-03 ENCOUNTER — Ambulatory Visit: Payer: Self-pay | Admitting: Nurse Practitioner

## 2024-04-03 ENCOUNTER — Encounter: Payer: Self-pay | Admitting: Nurse Practitioner

## 2024-04-04 ENCOUNTER — Other Ambulatory Visit: Payer: Self-pay | Admitting: Nurse Practitioner

## 2024-04-04 DIAGNOSIS — K760 Fatty (change of) liver, not elsewhere classified: Secondary | ICD-10-CM

## 2024-04-04 DIAGNOSIS — R7989 Other specified abnormal findings of blood chemistry: Secondary | ICD-10-CM

## 2024-04-05 ENCOUNTER — Encounter: Payer: Self-pay | Admitting: Gastroenterology

## 2024-04-08 ENCOUNTER — Telehealth: Payer: Self-pay

## 2024-04-08 NOTE — Telephone Encounter (Signed)
 Copied from CRM [id number]. Topic: Appointments - Scheduling Inquiry for Clinic >> Apr 08, 2024  3:11 PM [place] E wrote: Reason for CRM: Patient called in needing to schedule an ultrasound of her liver. Callback number [phone].

## 2024-04-10 ENCOUNTER — Ambulatory Visit: Admitting: Nurse Practitioner

## 2024-04-12 ENCOUNTER — Encounter: Payer: Self-pay | Admitting: Nurse Practitioner

## 2024-04-15 ENCOUNTER — Other Ambulatory Visit: Payer: Self-pay | Admitting: Nurse Practitioner

## 2024-04-15 ENCOUNTER — Telehealth (HOSPITAL_BASED_OUTPATIENT_CLINIC_OR_DEPARTMENT_OTHER): Admitting: Family

## 2024-04-15 DIAGNOSIS — F332 Major depressive disorder, recurrent severe without psychotic features: Secondary | ICD-10-CM

## 2024-04-15 DIAGNOSIS — F419 Anxiety disorder, unspecified: Secondary | ICD-10-CM | POA: Diagnosis not present

## 2024-04-15 MED ORDER — GABAPENTIN 100 MG PO CAPS: 100.0000 mg | ORAL_CAPSULE | Freq: Two times a day (BID) | ORAL | 0 refills | Status: DC

## 2024-04-15 MED ORDER — MECLIZINE HCL 12.5 MG PO TABS: 12.5000 mg | ORAL_TABLET | Freq: Three times a day (TID) | ORAL | 0 refills | Status: AC | PRN

## 2024-04-15 NOTE — Telephone Encounter (Signed)
 Pt is requesting a refill of meclizine . It was prescribed by a doctor at the hospital.  Last OV: 02/14/2024 Next OV: not scheduled

## 2024-04-15 NOTE — Progress Notes (Signed)
 Virtual Visit via Video Note  I connected with Sharon [name] on 04/15/24 at  3:30 PM EDT by a video enabled telemedicine application and verified that I am speaking with the correct person using two identifiers.  Location: Patient: Home Provider: Office   I discussed the limitations of evaluation and management by telemedicine and the availability of in person appointments. The patient expressed understanding and agreed to proceed.    I discussed the assessment and treatment plan with the patient. The patient was provided an opportunity to ask questions and all were answered. The patient agreed with the plan and demonstrated an understanding of the instructions.   The patient was advised to call back or seek an in-person evaluation if the symptoms worsen or if the condition fails to improve as anticipated.  I provided 15 minutes of non-face-to-face time during this encounter.   [name], NP   [facility] MD/PA/NP OP Progress Note  04/15/2024 3:55 PM Sharon [name]  MRN:  [id number]  Chief Complaint: Medication management  HPI:  Sharon [name] 68 year old female presents for medication management follow-up appointment.  Seen and evaluated via caregility virtual platform.  Sharon [name] carries a diagnosis related to depression and anxiety.  She reports she continues to struggle with increased anxiety and poor concentration.  Stated that sleep hygiene has improved.  Per chart review on 01/2024 telephone assessment patient was encouraged to increase hydroxyzine  10 mg to 20 mg as needed.  Reports she was unable to to tolerate hydroxyzine  as it caused dizziness.  States she has since discontinued medications.    Sharon [name]  is currently prescribed Abilify  5 mg and Lexapro  20 mg daily which she reports she has been taking and tolerating well.  States Abilify  has been helping and would like to continue taking medication as directed.  Discussed initiating BuSpar and/or gabapentin  for reported anxiety  symptoms.  She appeared receptive to plan.  Will make gabapentin  900 mg p.o. 3 times daily available for reported symptoms.    Visit Diagnosis:    ICD-10-CM   1. Anxiety and depression  F41.9    F32.A     2. 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 Celestia); has been on cimzia, remicade , 6MP, entocort, now stable on Entyvio  ([facility])   Genital warts    GERD (gastroesophageal reflux disease)    severe, daily sxs if off PPI   Hepatic steatosis 04/2011   diffuse on CT, 5mm gallbladder polyp   History of anemia    attributed to crohn's   History of chicken pox    History of syphilis 1980s   HTN (hypertension)    Migraines    and frequent other headaches (sinus or stress)   Scalp psoriasis    Sensorineural hearing loss of both ears 12/2012   high freq   Takotsubo syndrome 07/2012   cath 08/01/12, normal coronaries, LVEF 65%   TIA (transient ischemic attack) 05/2010   at [facility] - TIA vs complex migraine (w/u negative - carotids, echo, TC doppler,  and MRI normal)   Tobacco abuse     Past Surgical History:  Procedure Laterality Date   APPENDECTOMY  1998   CARDIAC CATHETERIZATION  07/2012   normal LV fxn, widely patent coronaries   CERVICAL BIOPSY  W/ LOOP ELECTRODE EXCISION  10/2008   COLON RESECTION  10/2014   removal of scar tissue colon from prior surgery ([facility] at [facility])   COLONOSCOPY  10/2008   ileo-colonic anastomosis, ielocolonic crohn's   COLONOSCOPY  05/2011   ileo-colonic anastomosis normal, normal mucosa   COLONOSCOPY  10/15/2012   focal inflammation  at anastomosis, no active crohn's, planning MR enterograph Robertha)   COLONOSCOPY  09/2014   [facility]   COLONOSCOPY WITH PROPOFOL  N/A 10/10/2022   Procedure: COLONOSCOPY WITH PROPOFOL ;  Surgeon: Unk Corinn Skiff, MD;  Location: [facility] ENDOSCOPY;  Service: Gastroenterology;  Laterality: N/A;   ESOPHAGOGASTRODUODENOSCOPY  05/2011   nl esophagus, gastritis, nl duodenum   ESOPHAGOGASTRODUODENOSCOPY (EGD) WITH PROPOFOL  N/A 10/10/2022   Procedure: ESOPHAGOGASTRODUODENOSCOPY (EGD) WITH PROPOFOL ;  Surgeon: Unk Corinn Skiff, MD;  Location: [facility] ENDOSCOPY;  Service: Gastroenterology;  Laterality: N/A;   KNEE SURGERY Left    LEFT HEART CATH AND CORONARY ANGIOGRAPHY Left 04/15/2020   Procedure: LEFT HEART CATH AND CORONARY ANGIOGRAPHY;  Surgeon: Florencio Cara BIRCH, MD;  Location: [facility] INVASIVE CV LAB;  Service: Cardiovascular;  Laterality: Left;   LEFT HEART CATHETERIZATION WITH CORONARY ANGIOGRAM N/A 08/01/2012   Procedure: LEFT HEART CATHETERIZATION WITH CORONARY ANGIOGRAM;  Surgeon: Ozell Fell, MD;  Location: [facility] CATH LAB;  Service: Cardiovascular;  Laterality: N/A;   RIGHT COLECTOMY  2000   crohn's disease, ileocecal resection   SHOULDER ARTHROSCOPY W/ ROTATOR CUFF REPAIR Right 05/31/2012   [facility])   US  ECHOCARDIOGRAPHY  05/2010   nl LV fxn ,EF 60%   VAGINAL HYSTERECTOMY  12/2008   LAVH/BSO    Family Psychiatric History:   Family History:  Family History  Problem Relation Age of Onset   Stroke Mother    Hypertension Mother    Hyperlipidemia Mother    Hypertension Father    Crohn's disease Father    Cancer Sister        ovarian   Crohn's disease Sister    Crohn's disease Daughter    Ulcerative colitis Daughter    Cancer Paternal Aunt        Brain   Crohn's disease Paternal Aunt    Crohn's disease Paternal Uncle    Stroke Paternal Grandmother    Diabetes Neg Hx    Breast cancer Neg Hx     Social History:  Social History   Socioeconomic History    Marital status: Divorced    Spouse name: Not on file   Number of children: 3   Years of education: Not on file   Highest education level: Bachelor's degree (e.g., BA, AB, BS)  Occupational History   Occupation: Nursing Tech    Comment: [employer]  Tobacco Use   Smoking status: Former    Current packs/day: 0.00    Average packs/day: 0.3 packs/day for 10.0 years (3.3 ttl pk-yrs)    Types: Cigarettes    Start date: 11/15/2006    Quit date: 11/14/2016    Years since quitting: 7.4   Smokeless tobacco: Never   Tobacco comments:    Quit within past yr  Vaping Use   Vaping status: Never Used  Substance and Sexual Activity   Alcohol use: Yes    Comment: occ   Drug use: No  Comment: extensive drug use, remotely   Sexual activity: Not Currently    Birth control/protection: Surgical, Post-menopausal  Other Topics Concern   Not on file  Social History Narrative   Not on file   Social Drivers of Health   Financial Resource Strain: Low Risk  (01/10/2024)   Overall Financial Resource Strain (CARDIA)    Difficulty of Paying Living Expenses: Not hard at all  Food Insecurity: No Food Insecurity (01/10/2024)   Hunger Vital Sign    Worried About Running Out of Food in the Last Year: Never true    Ran Out of Food in the Last Year: Never true  Transportation Needs: No Transportation Needs (01/10/2024)   PRAPARE - Administrator, Civil Service (Medical): No    Lack of Transportation (Non-Medical): No  Physical Activity: Inactive (01/10/2024)   Exercise Vital Sign    Days of Exercise per Week: 0 days    Minutes of Exercise per Session: 0 min  Stress: Stress Concern Present (01/10/2024)   [employer] of Occupational Health - Occupational Stress Questionnaire    Feeling of Stress : To some extent  Social Connections: Moderately Isolated (01/10/2024)   Social Connection and Isolation Panel    Frequency of Communication with Friends and Family: More than three times a week     Frequency of Social Gatherings with Friends and Family: More than three times a week    Attends Religious Services: More than 4 times per year    Active Member of [employer] or Organizations: No    Attends Banker Meetings: Never    Marital Status: Divorced    Allergies:  Allergies  Allergen Reactions   Infliximab  Dermatitis   Codeine Nausea And Vomiting   Flagyl  [Metronidazole ] Nausea And Vomiting   Hydrocodone  Nausea And Vomiting   Ibuprofen Other (See Comments)    Pt states that she is unable to take due to her Crohn's disease.   Remeron  [Mirtazapine ] Other (See Comments)    Reaction:  Makes pt lethargic     Metabolic Disorder Labs: Lab Results  Component Value Date   HGBA1C 5.5 05/26/2023   MPG 108 08/01/2012   MPG 103 06/09/2010   No results found for: PROLACTIN Lab Results  Component Value Date   CHOL 103 02/14/2024   TRIG 76.0 02/14/2024   HDL 50.40 02/14/2024   CHOLHDL 2 02/14/2024   VLDL 15.2 02/14/2024   LDLCALC 37 02/14/2024   LDLCALC 64 05/26/2023   Lab Results  Component Value Date   TSH 2.36 02/14/2024   TSH 1.64 05/26/2023    Therapeutic Level Labs: No results found for: LITHIUM No results found for: VALPROATE No results found for: CBMZ  Current Medications: Current Outpatient Medications  Medication Sig Dispense Refill   gabapentin  (NEURONTIN ) 100 MG capsule Take 1 capsule (100 mg total) by mouth 2 (two) times daily. 60 capsule 0   acetaminophen  (TYLENOL ) 500 MG tablet Take 1,000-1,500 mg by mouth 2 (two) times daily as needed for moderate pain or headache.     amLODipine  (NORVASC ) 5 MG tablet TAKE 1 TABLET (5 MG TOTAL) BY MOUTH DAILY. 90 tablet 1   ARIPiprazole  (ABILIFY ) 5 MG tablet Take 1 tablet (5 mg total) by mouth at bedtime. 90 tablet 3   aspirin  EC 81 MG tablet Take 1 tablet (81 mg total) by mouth daily. Over the counter 30 tablet 3   cyanocobalamin  (VITAMIN B12) 1000 MCG/ML injection Inject 1 mL (1,000 mcg total) into  the muscle  every 30 (thirty) days. 1 mL 11   escitalopram  (LEXAPRO ) 20 MG tablet Take 1 tablet (20 mg total) by mouth daily. 90 tablet 3   losartan  (COZAAR ) 100 MG tablet TAKE 1 TABLET BY MOUTH EVERY DAY 90 tablet 0   meclizine  (ANTIVERT ) 12.5 MG tablet Take 1 tablet (12.5 mg total) by mouth 3 (three) times daily as needed for dizziness. 30 tablet 0   metoprolol  tartrate (LOPRESSOR ) 25 MG tablet Take 0.5 tablets (12.5 mg total) by mouth 2 (two) times daily. 90 tablet 3   omeprazole (PRILOSEC) 20 MG capsule Take 20 mg by mouth daily.     potassium chloride  SA (KLOR-CON  M) 20 MEQ tablet TAKE 1 TABLET BY MOUTH EVERY DAY 90 tablet 1   rosuvastatin  (CRESTOR ) 5 MG tablet TAKE 1 TABLET BY MOUTH EVERYDAY AT BEDTIME 90 tablet 3   SKYRIZI 180 MG/1.2ML SOCT USE THE ON-BODY INJECTOR TO  ADMINISTER THE CONTENTS OF 1  CARTRIDGE SUBCUTANEOUSLY EVERY 8 WEEKS STARTING AT WEEK 12 1.2 mL 5   No current facility-administered medications for this visit.     Musculoskeletal: Virtual assessment   Psychiatric Specialty Exam: Review of Systems  Psychiatric/Behavioral:  Positive for decreased concentration. Negative for sleep disturbance. The patient is nervous/anxious.   All other systems reviewed and are negative.   There were no vitals taken for this visit.There is no height or weight on file to calculate BMI.  General Appearance: Casual  Eye Contact:  Good  Speech:  Clear and Coherent  Volume:  Normal  Mood:  Anxious and Depressed  Affect:  Congruent  Thought Process:  Coherent  Orientation:  Full (Time, Place, and Person)  Thought Content: Logical   Suicidal Thoughts:  No  Homicidal Thoughts:  No  Memory:  Immediate;   Good Recent;   Good  Judgement:  Good  Insight:  Good  Psychomotor Activity:  Normal  Concentration:  Concentration: Good  Recall:  Good  Fund of Knowledge: Good  Language: Good  Akathisia:  No  Handed:  Right  AIMS (if indicated): not done  Assets:  Communication  Skills Desire for Improvement  ADL's:  Intact  Cognition: WNL  Sleep:  Good   Screenings: GAD-7    Flowsheet Row Office Visit from 02/14/2024 in [facility] [employer] Visit from 06/23/2023 in [facility] [employer] Visit from 05/26/2023 in [facility] [employer] Visit from 07/19/2022 in [facility] [employer] Visit from 03/27/2018 in [facility]  Total GAD-7 Score 11 6 12 4 8    PHQ2-9    Flowsheet Row Office Visit from 02/14/2024 in [facility] [employer] Clinical Support from 01/10/2024 in [facility] [place] [facility] [employer] Visit from 07[facility]10/2023 in [facility] Office Visit from 06[facility]23/2023 in [facility] [place] [facility] [employer] Visit from 05/26/2023 in [facility] [place] [facility] at [employer]  PHQ-2 Total Score 1 0 2 0 3  PHQ-9 Total Score 4 1 8 6 17    Flowsheet Row ED from 11/08/2023 in [facility] at [facility] ED from 03/16/2023 in [facility] Department at [facility] Admission (Discharged) from 10/10/2022 in [facility]  C-SSRS RISK CATEGORY No Risk High Risk No Risk     Assessment and Plan: Sharon [name] 68 year old female presents for medication management  follow-up appointment.  Reports ongoing depression and anxiety symptoms.  Discussed the need to initiate BuSpar and/or gabapentin  for reported symptoms.  She was amendable to plan.  Patient to start gabapentin  100 mg p.o. twice daily.  Continue Abilify  and Lexapro  as directed.  No other concerns noted at this visit.  States her sleep hygiene has improved.  Will continue to monitor symptoms.  Support encouragement reassurance was provided.  Collaboration  of Care: Collaboration of Care: Medication Management AEB initiated gabapentin  100 mg p.o. twice daily  Patient/Guardian was advised Release of Information must be obtained prior to any record release in order to collaborate their care with an outside provider. Patient/Guardian was advised if they have not already done so to contact the registration department to sign all necessary forms in order for us  to release information regarding their care.   Consent: Patient/Guardian gives verbal consent for treatment and assignment of benefits for services provided during this visit. Patient/Guardian expressed understanding and agreed to proceed.    [name], NP 04/15/2024, 3:55 PM

## 2024-04-17 ENCOUNTER — Ambulatory Visit: Admitting: Internal Medicine

## 2024-04-18 ENCOUNTER — Ambulatory Visit
Admission: RE | Admit: 2024-04-18 | Discharge: 2024-04-18 | Disposition: A | Discharge status: Home / Self Care | Source: Ambulatory Visit | Attending: Nurse Practitioner | Admitting: Nurse Practitioner

## 2024-04-18 DIAGNOSIS — K760 Fatty (change of) liver, not elsewhere classified: Secondary | ICD-10-CM | POA: Diagnosis not present

## 2024-04-18 DIAGNOSIS — K8020 Calculus of gallbladder without cholecystitis without obstruction: Secondary | ICD-10-CM | POA: Diagnosis not present

## 2024-04-18 DIAGNOSIS — R7989 Other specified abnormal findings of blood chemistry: Secondary | ICD-10-CM | POA: Insufficient documentation

## 2024-04-18 DIAGNOSIS — R748 Abnormal levels of other serum enzymes: Secondary | ICD-10-CM | POA: Diagnosis not present

## 2024-04-24 ENCOUNTER — Ambulatory Visit (INDEPENDENT_AMBULATORY_CARE_PROVIDER_SITE_OTHER)

## 2024-04-24 DIAGNOSIS — E538 Deficiency of other specified B group vitamins: Secondary | ICD-10-CM | POA: Diagnosis not present

## 2024-04-24 MED ORDER — CYANOCOBALAMIN 1000 MCG/ML IJ SOLN
1000.0000 ug | Freq: Once | INTRAMUSCULAR | Status: AC
Start: 2024-04-24 — End: 2024-04-24
  Administered 2024-04-24: 1000 ug via INTRAMUSCULAR

## 2024-04-24 NOTE — Progress Notes (Signed)
Pt received B12 injection in Left deltoid. Pt tolerated it well with no complaints or concerns.

## 2024-04-25 ENCOUNTER — Ambulatory Visit: Payer: Self-pay | Admitting: Nurse Practitioner

## 2024-04-26 NOTE — Telephone Encounter (Signed)
 Patient returned call from Leron Glance, FNP-C.  E2C2 transferred call to me since they are not allowed to read imaging results.  I read Kacy's message to patient.  Patient states she is not experiencing abdominal pain, nausea, vomiting or diarrhea at this time, but will let us  know if that changes.  Patient states she does not have any questions for Kacy at this time.

## 2024-05-05 ENCOUNTER — Other Ambulatory Visit (HOSPITAL_COMMUNITY): Payer: Self-pay | Admitting: Family

## 2024-05-08 ENCOUNTER — Ambulatory Visit: Admitting: Nurse Practitioner

## 2024-05-12 DIAGNOSIS — F32A Depression, unspecified: Secondary | ICD-10-CM | POA: Diagnosis not present

## 2024-05-12 DIAGNOSIS — G4733 Obstructive sleep apnea (adult) (pediatric): Secondary | ICD-10-CM | POA: Diagnosis not present

## 2024-05-12 DIAGNOSIS — F419 Anxiety disorder, unspecified: Secondary | ICD-10-CM | POA: Diagnosis not present

## 2024-05-13 ENCOUNTER — Other Ambulatory Visit (HOSPITAL_COMMUNITY): Payer: Self-pay | Admitting: *Deleted

## 2024-05-13 MED ORDER — GABAPENTIN 100 MG PO CAPS: 100.0000 mg | ORAL_CAPSULE | Freq: Two times a day (BID) | ORAL | 0 refills | Status: DC

## 2024-05-23 DIAGNOSIS — R7989 Other specified abnormal findings of blood chemistry: Secondary | ICD-10-CM | POA: Diagnosis not present

## 2024-05-23 DIAGNOSIS — K50012 Crohn's disease of small intestine with intestinal obstruction: Secondary | ICD-10-CM | POA: Diagnosis not present

## 2024-05-23 DIAGNOSIS — G4733 Obstructive sleep apnea (adult) (pediatric): Secondary | ICD-10-CM | POA: Diagnosis not present

## 2024-05-23 DIAGNOSIS — K7581 Nonalcoholic steatohepatitis (NASH): Secondary | ICD-10-CM | POA: Diagnosis not present

## 2024-05-27 ENCOUNTER — Ambulatory Visit

## 2024-05-27 DIAGNOSIS — E538 Deficiency of other specified B group vitamins: Secondary | ICD-10-CM

## 2024-05-27 MED ORDER — CYANOCOBALAMIN 1000 MCG/ML IJ SOLN
1000.0000 ug | Freq: Once | INTRAMUSCULAR | Status: AC
  Administered 2024-05-27: 1000 ug via INTRAMUSCULAR

## 2024-05-27 NOTE — Progress Notes (Signed)
 Pt presented for their vitamin B12 injection. Pt was identified through two identifiers. Pt tolerated shot well in their right deltoid.

## 2024-06-03 ENCOUNTER — Telehealth (HOSPITAL_BASED_OUTPATIENT_CLINIC_OR_DEPARTMENT_OTHER): Admitting: Family

## 2024-06-03 DIAGNOSIS — F332 Major depressive disorder, recurrent severe without psychotic features: Secondary | ICD-10-CM | POA: Diagnosis not present

## 2024-06-03 DIAGNOSIS — F419 Anxiety disorder, unspecified: Secondary | ICD-10-CM

## 2024-06-03 DIAGNOSIS — F32A Depression, unspecified: Secondary | ICD-10-CM

## 2024-06-03 MED ORDER — GABAPENTIN 100 MG PO CAPS: 200.0000 mg | ORAL_CAPSULE | Freq: Two times a day (BID) | ORAL | 2 refills | Status: DC

## 2024-06-03 NOTE — Progress Notes (Signed)
 Virtual Visit via Video Note  I connected with Sharon [name] on 06/03/24 at  4:00 PM EDT by a video enabled telemedicine application and verified that I am speaking with the correct person using two identifiers.  Location: Patient: home Provider: office   I discussed the limitations of evaluation and management by telemedicine and the availability of in person appointments. The patient expressed understanding and agreed to proceed.  I discussed the assessment and treatment plan with the patient. The patient was provided an opportunity to ask questions and all were answered. The patient agreed with the plan and demonstrated an understanding of the instructions.   The patient was advised to call back or seek an in-person evaluation if the symptoms worsen or if the condition fails to improve as anticipated.  I provided 15 minutes of non-face-to-face time during this encounter.   [name], NP   [facility] MD/PA/NP OP Progress Note  06/03/2024 4:10 PM Sharon [name]  MRN:  [id number]  Chief Complaint: Medication management  HPI: Sharon [name]  68 year old female presents for medication management follow-up appointment.  She was seen and evaluated via care agility virtual platform.  Carries a diagnosis related to major depressive disorder, generalized anxiety and sleep disturbance.  She is currently prescribed Abilify , Lexapro , recently initiated on gabapentin  100 mg p.o. twice daily.  Which she reports she has been taking and tolerating well.  Reports she has noted a difference in her anxiety however continues to wake up anxious  shaking discussed titrating gabapentin  100 mg to 200 mg p.o. twice daily she was amendable to plan.  Sharon [name] observed sitting in her car mood is congruent.  Reports little anxiety related to upcoming colonoscopy appointment but overall states her mood is stabilized.  No concerns related to suicidal or homicidal ideations.  Denies auditory or visual  hallucinations.  Reports a good appetite.  States she is rested well throughout the night.  Will continue to monitor symptoms.  Support, encouragement and reassurance was provided. Visit Diagnosis:    ICD-10-CM   1. MDD (major depressive disorder), recurrent severe, without psychosis (HCC)  F33.2     2. Anxiety and depression  F41.9    F32.A       Past Psychiatric History:   Past Medical History:  Past Medical History:  Diagnosis Date   Anxiety and depression    Arthritis    B12 deficiency    pernicious anemia - monthly b12 shots   Cervical cancer (HCC) 12/2008   s/p hysterectomy   Crohn's disease (HCC) 2000   h/o stenotic crohn's ileitis, active in TI s/p resections 2000, 2016 PPD neg ([facility] Dr. Celestia); has been on cimzia, remicade , 6MP, entocort, now stable on Entyvio  ([facility])   Genital warts    GERD (gastroesophageal reflux disease)    severe, daily sxs if off PPI   Hepatic steatosis 04/2011   diffuse on CT, 5mm gallbladder polyp   History of anemia    attributed to crohn's   History of chicken pox    History of syphilis 1980s   HTN (hypertension)    Migraines    and frequent other headaches (sinus or stress)   Scalp psoriasis    Sensorineural hearing loss of both ears 12/2012   high freq   Takotsubo syndrome 07/2012   cath 08/01/12, normal coronaries, LVEF 65%   TIA (transient ischemic attack) 05/2010   at [facility] - TIA vs complex migraine (w/u negative - carotids, echo, TC doppler, and  MRI normal)   Tobacco abuse     Past Surgical History:  Procedure Laterality Date   APPENDECTOMY  1998   CARDIAC CATHETERIZATION  07/2012   normal LV fxn, widely patent coronaries   CERVICAL BIOPSY  W/ LOOP ELECTRODE EXCISION  10/2008   COLON RESECTION  10/2014   removal of scar tissue colon from prior surgery ([facility] at [facility])   COLONOSCOPY  10/2008   ileo-colonic anastomosis, ielocolonic crohn's   COLONOSCOPY  05/2011   ileo-colonic anastomosis normal, normal  mucosa   COLONOSCOPY  10/15/2012   focal inflammation at anastomosis, no active crohn's, planning MR enterograph Robertha)   COLONOSCOPY  09/2014   [facility]   COLONOSCOPY WITH PROPOFOL  N/A 10/10/2022   Procedure: COLONOSCOPY WITH PROPOFOL ;  Surgeon: Unk Corinn Skiff, MD;  Location: [facility] ENDOSCOPY;  Service: Gastroenterology;  Laterality: N/A;   ESOPHAGOGASTRODUODENOSCOPY  05/2011   nl esophagus, gastritis, nl duodenum   ESOPHAGOGASTRODUODENOSCOPY (EGD) WITH PROPOFOL  N/A 10/10/2022   Procedure: ESOPHAGOGASTRODUODENOSCOPY (EGD) WITH PROPOFOL ;  Surgeon: Unk Corinn Skiff, MD;  Location: [facility] ENDOSCOPY;  Service: Gastroenterology;  Laterality: N/A;   KNEE SURGERY Left    LEFT HEART CATH AND CORONARY ANGIOGRAPHY Left 04/15/2020   Procedure: LEFT HEART CATH AND CORONARY ANGIOGRAPHY;  Surgeon: Florencio Cara BIRCH, MD;  Location: [facility] INVASIVE CV LAB;  Service: Cardiovascular;  Laterality: Left;   LEFT HEART CATHETERIZATION WITH CORONARY ANGIOGRAM N/A 08/01/2012   Procedure: LEFT HEART CATHETERIZATION WITH CORONARY ANGIOGRAM;  Surgeon: Ozell Fell, MD;  Location: [facility] CATH LAB;  Service: Cardiovascular;  Laterality: N/A;   RIGHT COLECTOMY  2000   crohn's disease, ileocecal resection   SHOULDER ARTHROSCOPY W/ ROTATOR CUFF REPAIR Right 05/31/2012   [facility])   US  ECHOCARDIOGRAPHY  05/2010   nl LV fxn ,EF 60%   VAGINAL HYSTERECTOMY  12/2008   [facility]/BSO    Family Psychiatric History:   Family History:  Family History  Problem Relation Age of Onset   Stroke Mother    Hypertension Mother    Hyperlipidemia Mother    Hypertension Father    Crohn's disease Father    Cancer Sister        ovarian   Crohn's disease Sister    Crohn's disease Daughter    Ulcerative colitis Daughter    Cancer Paternal Aunt        Brain   Crohn's disease Paternal Aunt    Crohn's disease Paternal Uncle    Stroke Paternal Grandmother    Diabetes Neg Hx    Breast cancer Neg Hx     Social  History:  Social History   Socioeconomic History   Marital status: Divorced    Spouse name: Not on file   Number of children: 3   Years of education: Not on file   Highest education level: Bachelor's degree (e.g., BA, AB, BS)  Occupational History   Occupation: Nursing Tech    Comment: [employer]  Tobacco Use   Smoking status: Former    Current packs/day: 0.00    Average packs/day: 0.3 packs/day for 10.0 years (3.3 ttl pk-yrs)    Types: Cigarettes    Start date: 11/15/2006    Quit date: 11/14/2016    Years since quitting: 7.5   Smokeless tobacco: Never   Tobacco comments:    Quit within past yr  Vaping Use   Vaping status: Never Used  Substance and Sexual Activity   Alcohol use: Yes    Comment: occ   Drug use: No    Comment:  extensive drug use, remotely   Sexual activity: Not Currently    Birth control/protection: Surgical, Post-menopausal  Other Topics Concern   Not on file  Social History Narrative   Not on file   Social Drivers of Health   Financial Resource Strain: Low Risk  (01/10/2024)   Overall Financial Resource Strain (CARDIA)    Difficulty of Paying Living Expenses: Not hard at all  Food Insecurity: No Food Insecurity (01/10/2024)   Hunger Vital Sign    Worried About Running Out of Food in the Last Year: Never true    Ran Out of Food in the Last Year: Never true  Transportation Needs: No Transportation Needs (01/10/2024)   PRAPARE - Administrator, Civil Service (Medical): No    Lack of Transportation (Non-Medical): No  Physical Activity: Inactive (01/10/2024)   Exercise Vital Sign    Days of Exercise per Week: 0 days    Minutes of Exercise per Session: 0 min  Stress: Stress Concern Present (01/10/2024)   [employer] of Occupational Health - Occupational Stress Questionnaire    Feeling of Stress : To some extent  Social Connections: Moderately Isolated (01/10/2024)   Social Connection and Isolation Panel    Frequency of Communication with  Friends and Family: More than three times a week    Frequency of Social Gatherings with Friends and Family: More than three times a week    Attends Religious Services: More than 4 times per year    Active Member of [employer] or Organizations: No    Attends Banker Meetings: Never    Marital Status: Divorced    Allergies:  Allergies  Allergen Reactions   Infliximab  Dermatitis   Codeine Nausea And Vomiting   Flagyl  [Metronidazole ] Nausea And Vomiting   Hydrocodone  Nausea And Vomiting   Ibuprofen Other (See Comments)    Pt states that she is unable to take due to her Crohn's disease.   Remeron  [Mirtazapine ] Other (See Comments)    Reaction:  Makes pt lethargic     Metabolic Disorder Labs: Lab Results  Component Value Date   HGBA1C 5.5 05/26/2023   MPG 108 08/01/2012   MPG 103 06/09/2010   No results found for: PROLACTIN Lab Results  Component Value Date   CHOL 103 02/14/2024   TRIG 76.0 02/14/2024   HDL 50.40 02/14/2024   CHOLHDL 2 02/14/2024   VLDL 15.2 02/14/2024   LDLCALC 37 02/14/2024   LDLCALC 64 05/26/2023   Lab Results  Component Value Date   TSH 2.36 02/14/2024   TSH 1.64 05/26/2023    Therapeutic Level Labs: No results found for: LITHIUM No results found for: VALPROATE No results found for: CBMZ  Current Medications: Current Outpatient Medications  Medication Sig Dispense Refill   acetaminophen  (TYLENOL ) 500 MG tablet Take 1,000-1,500 mg by mouth 2 (two) times daily as needed for moderate pain or headache.     amLODipine  (NORVASC ) 5 MG tablet TAKE 1 TABLET (5 MG TOTAL) BY MOUTH DAILY. 90 tablet 1   ARIPiprazole  (ABILIFY ) 5 MG tablet Take 1 tablet (5 mg total) by mouth at bedtime. 90 tablet 3   aspirin  EC 81 MG tablet Take 1 tablet (81 mg total) by mouth daily. Over the counter 30 tablet 3   cyanocobalamin  (VITAMIN B12) 1000 MCG/ML injection Inject 1 mL (1,000 mcg total) into the muscle every 30 (thirty) days. 1 mL 11   escitalopram   (LEXAPRO ) 20 MG tablet Take 1 tablet (20 mg total) by mouth daily.  90 tablet 3   gabapentin  (NEURONTIN ) 100 MG capsule Take 2 capsules (200 mg total) by mouth 2 (two) times daily. 120 capsule 2   losartan  (COZAAR ) 100 MG tablet TAKE 1 TABLET BY MOUTH EVERY DAY 90 tablet 0   meclizine  (ANTIVERT ) 12.5 MG tablet Take 1 tablet (12.5 mg total) by mouth 3 (three) times daily as needed for dizziness. 30 tablet 0   metoprolol  tartrate (LOPRESSOR ) 25 MG tablet Take 0.5 tablets (12.5 mg total) by mouth 2 (two) times daily. 90 tablet 3   omeprazole (PRILOSEC) 20 MG capsule Take 20 mg by mouth daily.     potassium chloride  SA (KLOR-CON  M) 20 MEQ tablet TAKE 1 TABLET BY MOUTH EVERY DAY 90 tablet 1   rosuvastatin  (CRESTOR ) 5 MG tablet TAKE 1 TABLET BY MOUTH EVERYDAY AT BEDTIME 90 tablet 3   SKYRIZI 180 MG/1.2ML SOCT USE THE ON-BODY INJECTOR TO  ADMINISTER THE CONTENTS OF 1  CARTRIDGE SUBCUTANEOUSLY EVERY 8 WEEKS STARTING AT WEEK 12 1.2 mL 5   No current facility-administered medications for this visit.     Musculoskeletal:   Psychiatric Specialty Exam: Review of Systems  There were no vitals taken for this visit.There is no height or weight on file to calculate BMI.  General Appearance: Casual  Eye Contact:  Good  Speech:  Clear and Coherent  Volume:  Normal  Mood:  Anxious and Depressed  Affect:  Congruent  Thought Process:  Coherent  Orientation:  Full (Time, Place, and Person)  Thought Content: Logical   Suicidal Thoughts:  No  Homicidal Thoughts:  No  Memory:  Immediate;   Good Recent;   Good  Judgement:  Good  Insight:  Good  Psychomotor Activity:  Normal  Concentration:  Concentration: Good  Recall:  Good  Fund of Knowledge: Good  Language: Good  Akathisia:  No  Handed:  Right  AIMS (if indicated): not done  Assets:  Communication Skills  ADL's:  Intact  Cognition: WNL  Sleep:  Good   Screenings: GAD-7    Flowsheet Row Office Visit from 02/14/2024 in [facility] [employer] at [employer] Visit from 06/23/2023 in [facility] [employer] at [employer] Visit from 05/26/2023 in [facility] [employer] at [employer] Visit from 07/19/2022 in [facility] [employer] at [employer] Visit from 03/27/2018 in [facility]  Total GAD-7 Score 11 6 12 4 8    PHQ2-9    Flowsheet Row Office Visit from 02/14/2024 in [facility] [employer] Clinical Support from 01/10/2024 in [facility] [employer] Visit from 07/10/2023 in [facility] Office Visit from 06/23/2023 in [facility] [employer] Visit from 05/26/2023 in [facility] at [employer]  PHQ-2 Total Score 1 0 2 0 3  PHQ-9 Total Score 4 1 8 6 17    Flowsheet Row ED from 11/08/2023 in [facility] at [facility] ED from 03/16/2023 in [facility] Department at [facility] Admission (Discharged) from 10/10/2022 in [facility]  C-SSRS RISK CATEGORY No Risk High Risk No Risk     Assessment and Plan: Sharon [name] 68 year old female presents for medication management.  Previously prescribed gabapentin  100 mg p.o. twice daily for reported increased anxiety symptoms.  Stated notable difference related to anxiety and shakiness.  Continues to have increased symptoms upon early  morning wakening however states her mood is stabilized since initiating medications.  Discussed increasing gabapentin  100 mg to 200 mg twice daily she was amendable to plan.  Will continue to monitor symptoms.  Patient to follow-up 3 months for medication adherence/tolerability.  Support, encouragement and reassurance was provided.  Collaboration of Care: Collaboration of Care: Medication Management AEB increased  gabapentin  to 200 mg twice daily  Patient/Guardian was advised Release of Information must be obtained prior to any record release in order to collaborate their care with an outside provider. Patient/Guardian was advised if they have not already done so to contact the registration department to sign all necessary forms in order for us  to release information regarding their care.   Consent: Patient/Guardian gives verbal consent for treatment and assignment of benefits for services provided during this visit. Patient/Guardian expressed understanding and agreed to proceed.    [name], NP 06/03/2024, 4:10 PM

## 2024-06-06 ENCOUNTER — Ambulatory Visit
Admission: RE | Admit: 2024-06-06 | Discharge: 2024-06-06 | Disposition: A | Discharge status: Home / Self Care | Attending: Gastroenterology | Admitting: Gastroenterology

## 2024-06-06 ENCOUNTER — Ambulatory Visit: Admitting: Anesthesiology

## 2024-06-06 ENCOUNTER — Encounter: Payer: Self-pay | Admitting: Gastroenterology

## 2024-06-06 ENCOUNTER — Encounter
Admission: RE | Disposition: A | Discharge status: Home / Self Care | Payer: Self-pay | Source: Home / Self Care | Attending: Gastroenterology

## 2024-06-06 DIAGNOSIS — K529 Noninfective gastroenteritis and colitis, unspecified: Secondary | ICD-10-CM | POA: Insufficient documentation

## 2024-06-06 DIAGNOSIS — K5000 Crohn's disease of small intestine without complications: Secondary | ICD-10-CM | POA: Diagnosis not present

## 2024-06-06 DIAGNOSIS — I10 Essential (primary) hypertension: Secondary | ICD-10-CM | POA: Diagnosis not present

## 2024-06-06 DIAGNOSIS — E785 Hyperlipidemia, unspecified: Secondary | ICD-10-CM | POA: Diagnosis not present

## 2024-06-06 DIAGNOSIS — Z980 Intestinal bypass and anastomosis status: Secondary | ICD-10-CM | POA: Insufficient documentation

## 2024-06-06 DIAGNOSIS — K50012 Crohn's disease of small intestine with intestinal obstruction: Secondary | ICD-10-CM | POA: Diagnosis not present

## 2024-06-06 DIAGNOSIS — K5289 Other specified noninfective gastroenteritis and colitis: Secondary | ICD-10-CM | POA: Diagnosis not present

## 2024-06-06 DIAGNOSIS — K648 Other hemorrhoids: Secondary | ICD-10-CM | POA: Diagnosis not present

## 2024-06-06 DIAGNOSIS — K644 Residual hemorrhoidal skin tags: Secondary | ICD-10-CM | POA: Diagnosis not present

## 2024-06-06 DIAGNOSIS — Z87891 Personal history of nicotine dependence: Secondary | ICD-10-CM | POA: Diagnosis not present

## 2024-06-06 HISTORY — PX: COLONOSCOPY: SHX5424

## 2024-06-06 SURGERY — COLONOSCOPY
Anesthesia: General

## 2024-06-06 MED ORDER — EPHEDRINE SULFATE-NACL 50-0.9 MG/10ML-% IV SOSY
PREFILLED_SYRINGE | INTRAVENOUS | Status: DC | PRN
  Administered 2024-06-06: 15 mg via INTRAVENOUS

## 2024-06-06 MED ORDER — PROPOFOL 10 MG/ML IV BOLUS
INTRAVENOUS | Status: DC | PRN
  Administered 2024-06-06: 20 mg via INTRAVENOUS
  Administered 2024-06-06: 40 mg via INTRAVENOUS
  Administered 2024-06-06: 30 mg via INTRAVENOUS

## 2024-06-06 MED ORDER — DEXMEDETOMIDINE HCL IN NACL 80 MCG/20ML IV SOLN
INTRAVENOUS | Status: DC | PRN
  Administered 2024-06-06: 12 ug via INTRAVENOUS
  Administered 2024-06-06: 8 ug via INTRAVENOUS

## 2024-06-06 MED ORDER — SODIUM CHLORIDE 0.9 % IV SOLN: INTRAVENOUS | Status: DC

## 2024-06-06 MED ORDER — LIDOCAINE HCL (CARDIAC) PF 100 MG/5ML IV SOSY
PREFILLED_SYRINGE | INTRAVENOUS | Status: DC | PRN
  Administered 2024-06-06: 60 mg via INTRAVENOUS

## 2024-06-06 MED ORDER — LIDOCAINE HCL (PF) 2 % IJ SOLN
INTRAMUSCULAR | Status: AC
  Filled 2024-06-06: qty 5

## 2024-06-06 MED ORDER — PROPOFOL 500 MG/50ML IV EMUL
INTRAVENOUS | Status: DC | PRN
  Administered 2024-06-06: 75 ug/kg/min via INTRAVENOUS

## 2024-06-06 NOTE — H&P (Signed)
 Corinn JONELLE Brooklyn, MD [facility], DHIP [address]  [place], [place] [address]  Main: [phone] Fax:  [phone] Pager: [phone]   Primary Care Physician:  Gretel App, NP Primary Gastroenterologist:  Dr. Corinn JONELLE Brooklyn  Pre-Procedure History & Physical: HPI:  Sharon [name] is a 68 y.o. female is here for an colonoscopy.   Past Medical History:  Diagnosis Date   Anxiety and depression    Arthritis    B12 deficiency    pernicious anemia - monthly b12 shots   Cervical cancer (HCC) 12/2008   s/p hysterectomy   Crohn's disease (HCC) 2000   h/o stenotic crohn's ileitis, active in TI s/p resections 2000, 2016 PPD neg ([facility] Dr[facility]); has been on cimzia, remicade , , entocort, now stable on Entyvio  ([facility])   Genital warts    GERD (gastroesophageal reflux disease)    severe, daily sxs if off PPI   Hepatic steatosis 04/2011   diffuse on CT, 5mm gallbladder polyp   History of anemia    attributed to crohn's   History of chicken pox    History of syphilis 1980s   HTN (hypertension)    Migraines    and frequent other headaches (sinus or stress)   Scalp psoriasis    Sensorineural hearing loss of both ears 12/2012   high freq   Takotsubo syndrome 07/2012   cath 08/01/12, normal coronaries, LVEF 65%   TIA (transient ischemic attack) 05/2010   at [facility] - TIA vs complex migraine (w/u negative - carotids, echo, TC doppler, and MRI normal)   Tobacco abuse     Past Surgical History:  Procedure Laterality Date   APPENDECTOMY  1998   CARDIAC CATHETERIZATION  07/2012   normal LV fxn, widely patent coronaries   CERVICAL BIOPSY  W/ LOOP ELECTRODE EXCISION  10/2008   COLON RESECTION  10/2014   removal of scar tissue colon from prior surgery ([facility] at [facility])   COLONOSCOPY  10/2008   ileo-colonic anastomosis, ielocolonic crohn's   COLONOSCOPY  05/2011   ileo-colonic anastomosis normal, normal mucosa   COLONOSCOPY  10/15/2012    focal inflammation at anastomosis, no active crohn's, planning MR enterograph Robertha)   COLONOSCOPY  09/2014   [facility]   COLONOSCOPY WITH PROPOFOL  N/A 10/10/2022   Procedure: COLONOSCOPY WITH PROPOFOL ;  Surgeon: [place] Corinn Skiff, MD;  Location: [facility] ENDOSCOPY;  Service: Gastroenterology;  Laterality: N/A;   ESOPHAGOGASTRODUODENOSCOPY  05/2011   nl esophagus, gastritis, nl duodenum   ESOPHAGOGASTRODUODENOSCOPY (EGD) WITH PROPOFOL  N/A 10/10/2022   Procedure: ESOPHAGOGASTRODUODENOSCOPY (EGD) WITH PROPOFOL ;  Surgeon: [place] Corinn Skiff, MD;  Location: [facility] ENDOSCOPY;  Service: Gastroenterology;  Laterality: N/A;   KNEE SURGERY Left    LEFT HEART CATH AND CORONARY ANGIOGRAPHY Left 04/15/2020   Procedure: LEFT HEART CATH AND CORONARY ANGIOGRAPHY;  Surgeon: Florencio Cara BIRCH, MD;  Location: [facility] INVASIVE CV LAB;  Service: Cardiovascular;  Laterality: Left;   LEFT HEART CATHETERIZATION WITH CORONARY ANGIOGRAM N/A 08/01/2012   Procedure: LEFT HEART CATHETERIZATION WITH CORONARY ANGIOGRAM;  Surgeon: Ozell Fell, MD;  Location: [facility] CATH LAB;  Service: Cardiovascular;  Laterality: N/A;   RIGHT COLECTOMY  2000   crohn's disease, ileocecal resection   SHOULDER ARTHROSCOPY W/ ROTATOR CUFF REPAIR Right 05/31/2012   [facility])   US  ECHOCARDIOGRAPHY  05/2010   nl LV fxn ,EF 60%   VAGINAL HYSTERECTOMY  12/2008   [facility]    Prior to Admission medications   Medication Sig Start Date  End Date Taking? Authorizing Provider  amLODipine  (NORVASC ) 5 MG tablet TAKE 1 TABLET (5 MG TOTAL) BY MOUTH DAILY. 01/02/24  Yes Gretel App, NP  ARIPiprazole  (ABILIFY ) 5 MG tablet Take 1 tablet (5 mg total) by mouth at bedtime. 02/19/24  Yes Ezzard Staci SAILOR, NP  aspirin  EC 81 MG tablet Take 1 tablet (81 mg total) by mouth daily. Over the counter 05/01/16  Yes Rai, Ripudeep K, MD  cyanocobalamin  (VITAMIN B12) 1000 MCG/ML injection Inject 1 mL (1,000 mcg total) into the muscle every 30 (thirty) days.  06/20/23  Yes Gretel App, NP  escitalopram  (LEXAPRO ) 20 MG tablet Take 1 tablet (20 mg total) by mouth daily. 02/19/24  Yes Ezzard Staci SAILOR, NP  gabapentin  (NEURONTIN ) 100 MG capsule Take 2 capsules (200 mg total) by mouth 2 (two) times daily. 06/03/24  Yes Ezzard Staci SAILOR, NP  losartan  (COZAAR ) 100 MG tablet TAKE 1 TABLET BY MOUTH EVERY DAY 04/01/24  Yes Gretel App, NP  meclizine  (ANTIVERT ) 12.5 MG tablet Take 1 tablet (12.5 mg total) by mouth 3 (three) times daily as needed for dizziness. 04/15/24  Yes Gretel App, NP  metoprolol  tartrate (LOPRESSOR ) 25 MG tablet Take 0.5 tablets (12.5 mg total) by mouth 2 (two) times daily. 01/26/24  Yes Gretel App, NP  omeprazole (PRILOSEC) 20 MG capsule Take 20 mg by mouth daily.   Yes [provider]  potassium chloride  SA (KLOR-CON  M) 20 MEQ tablet TAKE 1 TABLET BY MOUTH EVERY DAY 12/20/23  Yes Gretel App, NP  rosuvastatin  (CRESTOR ) 5 MG tablet TAKE 1 TABLET BY MOUTH EVERYDAY AT BEDTIME 04/03/24  Yes Gretel App, NP  acetaminophen  (TYLENOL ) 500 MG tablet Take 1,000-1,500 mg by mouth 2 (two) times daily as needed for moderate pain or headache.    [provider]  SKYRIZI 180 MG/1.2ML SOCT USE THE ON-BODY INJECTOR TO  ADMINISTER THE CONTENTS OF 1  CARTRIDGE SUBCUTANEOUSLY EVERY 8 WEEKS STARTING AT WEEK 12 10/11/23   Unk Corinn Skiff, MD    Allergies as of 05/25/2024 - Review Complete 04/15/2024  Allergen Reaction Noted   Infliximab  Dermatitis 12/10/2015   Codeine Nausea And Vomiting 05/16/2011   Flagyl  [metronidazole ] Nausea And Vomiting 07/15/2014   Hydrocodone  Nausea And Vomiting 11/13/2012   Ibuprofen Other (See Comments) 05/23/2012   Remeron  [mirtazapine ] Other (See Comments) 10/13/2011    Family History  Problem Relation Age of Onset   Stroke Mother    Hypertension Mother    Hyperlipidemia Mother    Hypertension Father    Crohn's disease Father    Cancer Sister        ovarian   Crohn's disease Sister    Crohn's disease  Daughter    Ulcerative colitis Daughter    Cancer Paternal Aunt        Brain   Crohn's disease Paternal Aunt    Crohn's disease Paternal Uncle    Stroke Paternal Grandmother    Diabetes Neg Hx    Breast cancer Neg Hx     Social History   Socioeconomic History   Marital status: Divorced    Spouse name: Not on file   Number of children: 3   Years of education: Not on file   Highest education level: Bachelor's degree (e.g., BA, AB, BS)  Occupational History   Occupation: Nursing Tech    Comment: [employer]  Tobacco Use   Smoking status: Former    Current packs/day: 0.00    Average packs/day: 0.3 packs/day for 10.0 years (3.3 ttl pk-yrs)  Types: Cigarettes    Start date: 11/15/2006    Quit date: 11/14/2016    Years since quitting: 7.5   Smokeless tobacco: Never   Tobacco comments:    Quit within past yr  Vaping Use   Vaping status: Never Used  Substance and Sexual Activity   Alcohol use: Yes    Comment: occ   Drug use: No    Comment: extensive drug use, remotely   Sexual activity: Not Currently    Birth control/protection: Surgical, Post-menopausal  Other Topics Concern   Not on file  Social History Narrative   Not on file   Social Drivers of Health   Financial Resource Strain: Low Risk  (01/10/2024)   Overall Financial Resource Strain (CARDIA)    Difficulty of Paying Living Expenses: Not hard at all  Food Insecurity: No Food Insecurity (01/10/2024)   Hunger Vital Sign    Worried About Running Out of Food in the Last Year: Never true    Ran Out of Food in the Last Year: Never true  Transportation Needs: No Transportation Needs (01/10/2024)   PRAPARE - Administrator, Civil Service (Medical): No    Lack of Transportation (Non-Medical): No  Physical Activity: Inactive (01/10/2024)   Exercise Vital Sign    Days of Exercise per Week: 0 days    Minutes of Exercise per Session: 0 min  Stress: Stress Concern Present (01/10/2024)   [employer] of  Occupational Health - Occupational Stress Questionnaire    Feeling of Stress : To some extent  Social Connections: Moderately Isolated (01/10/2024)   Social Connection and Isolation Panel    Frequency of Communication with Friends and Family: More than three times a week    Frequency of Social Gatherings with Friends and Family: More than three times a week    Attends Religious Services: More than 4 times per year    Active Member of [employer] or Organizations: No    Attends Banker Meetings: Never    Marital Status: Divorced  Catering manager Violence: Not At Risk (01/10/2024)   Humiliation, Afraid, Rape, and Kick questionnaire    Fear of Current or Ex-Partner: No    Emotionally Abused: No    Physically Abused: No    Sexually Abused: No    Review of Systems: See HPI, otherwise negative ROS  Physical Exam: BP (!) 101/43   Pulse (!) 48   Temp (!) 97 F (36.1 C) (Temporal)   Resp 18   Ht 5' 1 (1.549 m)   Wt 70.2 kg   SpO2 98%   BMI 29.25 kg/m  General:   Alert,  pleasant and cooperative in NAD Head:  Normocephalic and atraumatic. Neck:  Supple; no masses or thyromegaly. Lungs:  Clear throughout to auscultation.    Heart:  Regular rate and rhythm. Abdomen:  Soft, nontender and nondistended. Normal bowel sounds, without guarding, and without rebound.   Neurologic:  Alert and  oriented x4;  grossly normal neurologically.  Impression/Plan: Fredda H Corvin is here for an colonoscopy to be performed for Postop recurrence of small bowel Crohn's:   Risks, benefits, limitations, and alternatives regarding  colonoscopy have been reviewed with the patient.  Questions have been answered.  All parties agreeable.   Corinn Brooklyn, MD  06/06/2024, 8:25 AM

## 2024-06-06 NOTE — Op Note (Signed)
 [facility] Gastroenterology Patient Name: Sharon [name] Procedure Date: 06/06/2024 8:45 AM MRN: [id number] Account #: [id number] Date of Birth: 1955/10/13 Admit Type: Outpatient Age: 68 Room: [facility] ENDO ROOM 3 Gender: Female Note Status: Finalized Instrument Name: Peds Colonoscope [id number] Procedure:             Colonoscopy Indications:           Last colonoscopy: February 2024, For therapy of                         Crohn's disease of the small bowel, Disease activity                         assessment of Crohn's disease of the small bowel,                         Assess therapeutic response to therapy of Crohn's                         disease of the small bowel Providers:             Corinn Jess Brooklyn MD, MD Referring MD:          Leron Glance (Referring MD) Medicines:             General Anesthesia Complications:         No immediate complications. Estimated blood loss: None. Procedure:             Pre-Anesthesia Assessment:                        - Prior to the procedure, a History and Physical was                         performed, and patient medications and allergies were                         reviewed. The patient is competent. The risks and                         benefits of the procedure and the sedation options and                         risks were discussed with the patient. All questions                         were answered and informed consent was obtained.                         Patient identification and proposed procedure were                         verified by the physician, the nurse, the                         anesthesiologist, the anesthetist and the technician                         in the pre-procedure area in the procedure room in the  endoscopy suite. Mental Status Examination: alert and                         oriented. Airway Examination: normal oropharyngeal                         airway and neck  mobility. Respiratory Examination:                         clear to auscultation. CV Examination: normal.                         Prophylactic Antibiotics: The patient does not require                         prophylactic antibiotics. Prior Anticoagulants: The                         patient has taken no anticoagulant or antiplatelet                         agents. ASA Grade Assessment: III - A patient with                         severe systemic disease. After reviewing the risks and                         benefits, the patient was deemed in satisfactory                         condition to undergo the procedure. The anesthesia                         plan was to use general anesthesia. Immediately prior                         to administration of medications, the patient was                         re-assessed for adequacy to receive sedatives. The                         heart rate, respiratory rate, oxygen saturations,                         blood pressure, adequacy of pulmonary ventilation, and                         response to care were monitored throughout the                         procedure. The physical status of the patient was                         re-assessed after the procedure.                        After obtaining informed consent, the colonoscope was  passed under direct vision. Throughout the procedure,                         the patient's blood pressure, pulse, and oxygen                         saturations were monitored continuously. The                         Colonoscope was introduced through the anus and                         advanced to the the ileocolonic anastomosis. The                         colonoscopy was performed without difficulty. The                         patient tolerated the procedure well. The quality of                         the bowel preparation was good. Findings:      The perianal and digital rectal  examinations were normal. Pertinent       negatives include normal sphincter tone and no palpable rectal lesions.      There was evidence of a prior end-to-side ileo-colonic anastomosis in       the cecum. This was patent and was characterized by healthy appearing       mucosa. The anastomosis was traversed. Biopsies were taken with a cold       forceps for histology.      The entire examined colon appeared normal.      Non-bleeding external hemorrhoids were found during retroflexion. The       hemorrhoids were medium-sized. Impression:            - Patent end-to-side ileo-colonic anastomosis,                         characterized by healthy appearing mucosa. Biopsied.                        - The entire examined colon is normal.                        - Non-bleeding external hemorrhoids. Recommendation:        - Discharge patient to home (with escort).                        - Resume previous diet today.                        - Continue present medications.                        - Await pathology results.                        - Return to my office as previously scheduled. Procedure Code(s):     --- Professional ---  54619, Colonoscopy, flexible; with biopsy, single or                         multiple Diagnosis Code(s):     --- Professional ---                        Z98.0, Intestinal bypass and anastomosis status                        K64.4, Residual hemorrhoidal skin tags                        K50.00, Crohn's disease of small intestine without                         complications CPT copyright 2022 American Medical Association. All rights reserved. The codes documented in this report are preliminary and upon coder review may  be revised to meet current compliance requirements. Dr. Corinn Brooklyn Corinn Jess Brooklyn MD, MD 06/06/2024 9:22:51 AM This report has been signed electronically. Number of Addenda: 0 Note Initiated On: 06/06/2024 8:45 AM Scope  Withdrawal Time: 0 hours 10 minutes 32 seconds  Total Procedure Duration: 0 hours 15 minutes 54 seconds  Estimated Blood Loss:  Estimated blood loss: none.      [facility]

## 2024-06-06 NOTE — Anesthesia Postprocedure Evaluation (Signed)
 Anesthesia Post Note  Patient: [name]  Procedure(s) Performed: COLONOSCOPY  Patient location during evaluation: PACU Anesthesia Type: General Level of consciousness: awake and alert, oriented and patient cooperative Pain management: pain level controlled Vital Signs Assessment: post-procedure vital signs reviewed and stable Respiratory status: spontaneous breathing, nonlabored ventilation and respiratory function stable Cardiovascular status: blood pressure returned to baseline and stable Postop Assessment: adequate PO intake Anesthetic complications: no   No notable events documented.   Last Vitals:  Vitals:   06/06/24 0933 06/06/24 0943  BP: (!) 100/48 (!) 106/56  Pulse: (!) 59 (!) 58  Resp: 20 19  Temp:    SpO2: 99% 99%    Last Pain:  Vitals:   06/06/24 0943  TempSrc:   PainSc: 0-No pain                 Alfonso Ruths

## 2024-06-06 NOTE — Anesthesia Preprocedure Evaluation (Addendum)
 Anesthesia Evaluation  Patient identified by MRN, date of birth, ID band Patient awake    Reviewed: Allergy & Precautions, NPO status , Patient's Chart, lab work & pertinent test results  History of Anesthesia Complications Negative for: history of anesthetic complications  Airway Mallampati: IV   Neck ROM: Full    Dental  (+) Missing, Chipped   Pulmonary former smoker (quit 2018)   Pulmonary exam normal breath sounds clear to auscultation       Cardiovascular hypertension, Normal cardiovascular exam+ dysrhythmias (a fib)  Rhythm:Regular Rate:Normal  ECG 11/08/23: Sinus bradycardia; no STEMI   Neuro/Psych  Headaches PSYCHIATRIC DISORDERS Anxiety Depression    HOH TIA (2011)   GI/Hepatic ,GERD  ,,Crohn disease   Endo/Other  Obesity   Renal/GU negative Renal ROS     Musculoskeletal  (+) Arthritis ,    Abdominal   Peds  Hematology  (+) Blood dyscrasia, anemia   Anesthesia Other Findings   Reproductive/Obstetrics Cervical CA s/p hysterectomy                              Anesthesia Physical Anesthesia Plan  ASA: 3  Anesthesia Plan: General   Post-op Pain Management:    Induction: Intravenous  PONV Risk Score and Plan: 3 and Propofol  infusion, TIVA and Treatment may vary due to age or medical condition  Airway Management Planned: Natural Airway  Additional Equipment:   Intra-op Plan:   Post-operative Plan:   Informed Consent: I have reviewed the patients History and Physical, chart, labs and discussed the procedure including the risks, benefits and alternatives for the proposed anesthesia with the patient or authorized representative who has indicated his/her understanding and acceptance.       Plan Discussed with: CRNA  Anesthesia Plan Comments: (LMA/GETA backup discussed.  Patient consented for risks of anesthesia including but not limited to:  - adverse reactions to  medications - damage to eyes, teeth, lips or other oral mucosa - nerve damage due to positioning  - sore throat or hoarseness - damage to heart, brain, nerves, lungs, other parts of body or loss of life  Informed patient about role of CRNA in peri- and intra-operative care.  Patient voiced understanding.)         Anesthesia Quick Evaluation

## 2024-06-06 NOTE — Transfer of Care (Signed)
 Immediate Anesthesia Transfer of Care Note  Patient: Sharon [name]  Procedure(s) Performed: COLONOSCOPY  Patient Location: PACU  Anesthesia Type:General  Level of Consciousness: sedated  Airway & Oxygen Therapy: Patient Spontanous Breathing  Post-op Assessment: Report given to RN and Post -op Vital signs reviewed and stable  Post vital signs: Reviewed and stable  Last Vitals:  Vitals Value Taken Time  BP    Temp 35.6 C 06/06/24 09:23  Pulse 60 06/06/24 09:24  Resp 18 06/06/24 09:24  SpO2 99 % 06/06/24 09:24  Vitals shown include unfiled device data.  Last Pain:  Vitals:   06/06/24 0923  TempSrc: Temporal  PainSc: 0-No pain         Complications: No notable events documented.

## 2024-06-07 ENCOUNTER — Encounter: Payer: Self-pay | Admitting: Gastroenterology

## 2024-06-07 DIAGNOSIS — H5203 Hypermetropia, bilateral: Secondary | ICD-10-CM | POA: Diagnosis not present

## 2024-06-07 DIAGNOSIS — K5090 Crohn's disease, unspecified, without complications: Secondary | ICD-10-CM | POA: Diagnosis not present

## 2024-06-07 DIAGNOSIS — H538 Other visual disturbances: Secondary | ICD-10-CM | POA: Diagnosis not present

## 2024-06-07 DIAGNOSIS — H2513 Age-related nuclear cataract, bilateral: Secondary | ICD-10-CM | POA: Diagnosis not present

## 2024-06-07 DIAGNOSIS — H524 Presbyopia: Secondary | ICD-10-CM | POA: Diagnosis not present

## 2024-06-07 LAB — SURGICAL PATHOLOGY

## 2024-06-11 DIAGNOSIS — F32A Depression, unspecified: Secondary | ICD-10-CM | POA: Diagnosis not present

## 2024-06-11 DIAGNOSIS — F419 Anxiety disorder, unspecified: Secondary | ICD-10-CM | POA: Diagnosis not present

## 2024-06-12 ENCOUNTER — Ambulatory Visit: Payer: Self-pay | Admitting: Gastroenterology

## 2024-06-19 ENCOUNTER — Other Ambulatory Visit: Payer: Self-pay | Admitting: Gastroenterology

## 2024-06-19 DIAGNOSIS — R7989 Other specified abnormal findings of blood chemistry: Secondary | ICD-10-CM

## 2024-06-22 ENCOUNTER — Other Ambulatory Visit: Payer: Self-pay | Admitting: Nurse Practitioner

## 2024-06-22 DIAGNOSIS — I10 Essential (primary) hypertension: Secondary | ICD-10-CM

## 2024-06-22 DIAGNOSIS — E876 Hypokalemia: Secondary | ICD-10-CM

## 2024-06-26 ENCOUNTER — Ambulatory Visit

## 2024-06-27 ENCOUNTER — Ambulatory Visit (INDEPENDENT_AMBULATORY_CARE_PROVIDER_SITE_OTHER)

## 2024-06-27 DIAGNOSIS — E538 Deficiency of other specified B group vitamins: Secondary | ICD-10-CM | POA: Diagnosis not present

## 2024-06-27 MED ORDER — CYANOCOBALAMIN 1000 MCG/ML IJ SOLN
1000.0000 ug | Freq: Once | INTRAMUSCULAR | Status: AC
  Administered 2024-06-27: 1000 ug via INTRAMUSCULAR

## 2024-06-27 NOTE — Progress Notes (Signed)
 Patient for  US  guided Liver Biopsy on Monday 07/01/24, I called and spoke with the patient on the phone and gave pre-procedure instructions. Pt was made aware to be here at 10a, NPO after MN prior to procedure as well as driver post procedure/recovery/discharge. Pt stated understanding.  Called 06/27/24

## 2024-06-27 NOTE — Progress Notes (Signed)
 Patient presented for B 12 injection to left deltoid, patient voiced no concerns nor showed any signs of distress during injection.

## 2024-06-28 ENCOUNTER — Other Ambulatory Visit (HOSPITAL_COMMUNITY): Payer: Self-pay | Admitting: Student

## 2024-06-28 DIAGNOSIS — R7989 Other specified abnormal findings of blood chemistry: Secondary | ICD-10-CM

## 2024-06-30 ENCOUNTER — Other Ambulatory Visit: Payer: Self-pay | Admitting: Nurse Practitioner

## 2024-06-30 DIAGNOSIS — I10 Essential (primary) hypertension: Secondary | ICD-10-CM

## 2024-07-01 ENCOUNTER — Other Ambulatory Visit: Payer: Self-pay

## 2024-07-01 ENCOUNTER — Ambulatory Visit
Admission: RE | Admit: 2024-07-01 | Discharge: 2024-07-01 | Disposition: A | Discharge status: Home / Self Care | Source: Ambulatory Visit | Attending: Gastroenterology | Admitting: Gastroenterology

## 2024-07-01 DIAGNOSIS — Z8541 Personal history of malignant neoplasm of cervix uteri: Secondary | ICD-10-CM | POA: Diagnosis not present

## 2024-07-01 DIAGNOSIS — K7460 Unspecified cirrhosis of liver: Secondary | ICD-10-CM | POA: Diagnosis not present

## 2024-07-01 DIAGNOSIS — I10 Essential (primary) hypertension: Secondary | ICD-10-CM | POA: Insufficient documentation

## 2024-07-01 DIAGNOSIS — R7989 Other specified abnormal findings of blood chemistry: Secondary | ICD-10-CM | POA: Insufficient documentation

## 2024-07-01 DIAGNOSIS — K7689 Other specified diseases of liver: Secondary | ICD-10-CM | POA: Insufficient documentation

## 2024-07-01 DIAGNOSIS — Z79899 Other long term (current) drug therapy: Secondary | ICD-10-CM | POA: Insufficient documentation

## 2024-07-01 DIAGNOSIS — K5000 Crohn's disease of small intestine without complications: Secondary | ICD-10-CM | POA: Insufficient documentation

## 2024-07-01 DIAGNOSIS — Z8673 Personal history of transient ischemic attack (TIA), and cerebral infarction without residual deficits: Secondary | ICD-10-CM | POA: Diagnosis not present

## 2024-07-01 DIAGNOSIS — Z87891 Personal history of nicotine dependence: Secondary | ICD-10-CM | POA: Diagnosis not present

## 2024-07-01 DIAGNOSIS — B159 Hepatitis A without hepatic coma: Secondary | ICD-10-CM | POA: Insufficient documentation

## 2024-07-01 DIAGNOSIS — K7581 Nonalcoholic steatohepatitis (NASH): Secondary | ICD-10-CM | POA: Diagnosis not present

## 2024-07-01 DIAGNOSIS — Z723 Lack of physical exercise: Secondary | ICD-10-CM | POA: Diagnosis not present

## 2024-07-01 DIAGNOSIS — K8020 Calculus of gallbladder without cholecystitis without obstruction: Secondary | ICD-10-CM | POA: Diagnosis not present

## 2024-07-01 LAB — CBC
HCT: 35 % — ABNORMAL LOW (ref 36.0–46.0)
Hemoglobin: 11.6 g/dL — ABNORMAL LOW (ref 12.0–15.0)
MCH: 30.4 pg (ref 26.0–34.0)
MCHC: 33.1 g/dL (ref 30.0–36.0)
MCV: 91.9 fL (ref 80.0–100.0)
Platelets: 159 K/uL (ref 150–400)
RBC: 3.81 MIL/uL — ABNORMAL LOW (ref 3.87–5.11)
RDW: 14.6 % (ref 11.5–15.5)
WBC: 8.3 K/uL (ref 4.0–10.5)
nRBC: 0 % (ref 0.0–0.2)

## 2024-07-01 LAB — PROTIME-INR
INR: 1.2 (ref 0.8–1.2)
Prothrombin Time: 15.8 s — ABNORMAL HIGH (ref 11.4–15.2)

## 2024-07-01 MED ORDER — FENTANYL CITRATE (PF) 100 MCG/2ML IJ SOLN
INTRAMUSCULAR | Status: AC
  Filled 2024-07-01: qty 4

## 2024-07-01 MED ORDER — FENTANYL CITRATE (PF) 100 MCG/2ML IJ SOLN
INTRAMUSCULAR | Status: AC | PRN
  Administered 2024-07-01: 25 ug via INTRAVENOUS
  Administered 2024-07-01: 50 ug via INTRAVENOUS

## 2024-07-01 MED ORDER — MIDAZOLAM HCL 2 MG/2ML IJ SOLN
INTRAMUSCULAR | Status: AC
  Filled 2024-07-01: qty 4

## 2024-07-01 MED ORDER — MIDAZOLAM HCL (PF) 2 MG/2ML IJ SOLN
INTRAMUSCULAR | Status: AC | PRN
  Administered 2024-07-01: .5 mg via INTRAVENOUS
  Administered 2024-07-01: 1 mg via INTRAVENOUS

## 2024-07-01 MED ORDER — LIDOCAINE HCL (PF) 1 % IJ SOLN
10.0000 mL | Freq: Once | INTRAMUSCULAR | Status: AC
Start: 2024-07-01 — End: 2024-07-01
  Administered 2024-07-01: 10 mL via INTRADERMAL
  Filled 2024-07-01: qty 10

## 2024-07-01 MED ORDER — SODIUM CHLORIDE 0.9 % IV SOLN: INTRAVENOUS | Status: DC

## 2024-07-01 NOTE — H&P (Signed)
 Chief Complaint: Patient was seen in consultation today for elevated liver function tests.   Referring Physician(s): Unk Corinn Skiff  Supervising Physician: Luverne Aran  Patient Status: [facility] - Out-pt  History of Present Illness: Sharon [name] is a 68 y.o. female with a medical history significant for cervical cancer, Crohn's disease, HTN, TIA, Takotsubo syndrome and hepatic steatosis with elevated liver enzymes. Her GI team is unsure if the enzymes are elevated due to fatty liver disease or possibly due to an autoimmune condition.   Interventional Radiology has been asked to evaluate this patient for an image-guided non-focal liver biopsy for further work up.   Past Medical History:  Diagnosis Date   Anxiety and depression    Arthritis    B12 deficiency    pernicious anemia - monthly b12 shots   Cervical cancer (HCC) 12/2008   s/p hysterectomy   Crohn's disease (HCC) 2000   h/o stenotic crohn's ileitis, active in TI s/p resections 2000, 2016 PPD neg ([facility] Dr. Celestia); has been on cimzia, remicade , , entocort, now stable on Entyvio  ([facility])   Genital warts    GERD (gastroesophageal reflux disease)    severe, daily sxs if off PPI   Hepatic steatosis 04/2011   diffuse on CT, 5mm gallbladder polyp   History of anemia    attributed to crohn's   History of chicken pox    History of syphilis 1980s   HTN (hypertension)    Migraines    and frequent other headaches (sinus or stress)   Scalp psoriasis    Sensorineural hearing loss of both ears 12/2012   high freq   Takotsubo syndrome 07/2012   cath 08/01/12, normal coronaries, LVEF 65%   TIA (transient ischemic attack) 05/2010   at [facility] - TIA vs complex migraine (w/u negative - carotids, echo, TC doppler, and MRI normal)   Tobacco abuse     Past Surgical History:  Procedure Laterality Date   APPENDECTOMY  1998   CARDIAC CATHETERIZATION  07/2012   normal LV fxn, widely patent coronaries    CERVICAL BIOPSY  W/ LOOP ELECTRODE EXCISION  10/2008   COLON RESECTION  10/2014   removal of scar tissue colon from prior surgery ([facility] at [facility])   COLONOSCOPY  10/2008   ileo-colonic anastomosis, ielocolonic crohn's   COLONOSCOPY  05/2011   ileo-colonic anastomosis normal, normal mucosa   COLONOSCOPY  10/15/2012   focal inflammation at anastomosis, no active crohn's, planning MR enterograph Robertha)   COLONOSCOPY  09/2014   [facility]   COLONOSCOPY N/A 06/06/2024   Procedure: COLONOSCOPY;  Surgeon: Unk Corinn Skiff, MD;  Location: [facility] ENDOSCOPY;  Service: Gastroenterology;  Laterality: N/A;   COLONOSCOPY WITH PROPOFOL  N/A 10/10/2022   Procedure: COLONOSCOPY WITH PROPOFOL ;  Surgeon: Unk Corinn Skiff, MD;  Location: [facility] ENDOSCOPY;  Service: Gastroenterology;  Laterality: N/A;   ESOPHAGOGASTRODUODENOSCOPY  05/2011   nl esophagus, gastritis, nl duodenum   ESOPHAGOGASTRODUODENOSCOPY (EGD) WITH PROPOFOL  N/A 10/10/2022   Procedure: ESOPHAGOGASTRODUODENOSCOPY (EGD) WITH PROPOFOL ;  Surgeon: Unk Corinn Skiff, MD;  Location: [facility] ENDOSCOPY;  Service: Gastroenterology;  Laterality: N/A;   KNEE SURGERY Left    LEFT HEART CATH AND CORONARY ANGIOGRAPHY Left 04/15/2020   Procedure: LEFT HEART CATH AND CORONARY ANGIOGRAPHY;  Surgeon: Florencio Cara BIRCH, MD;  Location: [facility] INVASIVE CV LAB;  Service: Cardiovascular;  Laterality: Left;   LEFT HEART CATHETERIZATION WITH CORONARY ANGIOGRAM N/A 08/01/2012   Procedure: LEFT HEART CATHETERIZATION WITH CORONARY ANGIOGRAM;  Surgeon: Ozell Fell, MD;  [facility] CATH LAB;  Service: Cardiovascular;  Laterality: N/A;   RIGHT COLECTOMY  2000   crohn's disease, ileocecal resection   SHOULDER ARTHROSCOPY W/ ROTATOR CUFF REPAIR Right 05/31/2012   [facility])   US  ECHOCARDIOGRAPHY  05/2010   nl LV fxn ,EF 60%   VAGINAL HYSTERECTOMY  12/2008   LAVH/BSO    Allergies: Infliximab , Codeine, Flagyl  [metronidazole ], Hydrocodone , Ibuprofen, and  Remeron  [mirtazapine ]  Medications: Prior to Admission medications   Medication Sig Start Date End Date Taking? Authorizing Provider  acetaminophen  (TYLENOL ) 500 MG tablet Take 1,000-1,500 mg by mouth 2 (two) times daily as needed for moderate pain or headache.    [provider]  amLODipine  (NORVASC ) 5 MG tablet TAKE 1 TABLET (5 MG TOTAL) BY MOUTH DAILY. 01/02/24   Gretel App, NP  ARIPiprazole  (ABILIFY ) 5 MG tablet Take 1 tablet (5 mg total) by mouth at bedtime. 02/19/24   Ezzard Staci SAILOR, NP  aspirin  EC 81 MG tablet Take 1 tablet (81 mg total) by mouth daily. Over the counter 05/01/16   Rai, Nydia POUR, MD  cyanocobalamin  (VITAMIN B12) 1000 MCG/ML injection Inject 1 mL (1,000 mcg total) into the muscle every 30 (thirty) days. 06/20/23   Gretel App, NP  escitalopram  (LEXAPRO ) 20 MG tablet Take 1 tablet (20 mg total) by mouth daily. 02/19/24   Ezzard Staci SAILOR, NP  gabapentin  (NEURONTIN ) 100 MG capsule Take 2 capsules (200 mg total) by mouth 2 (two) times daily. 06/03/24   Ezzard Staci SAILOR, NP  losartan  (COZAAR ) 100 MG tablet TAKE 1 TABLET BY MOUTH EVERY DAY 06/24/24   Gretel App, NP  meclizine  (ANTIVERT ) 12.5 MG tablet Take 1 tablet (12.5 mg total) by mouth 3 (three) times daily as needed for dizziness. 04/15/24   Gretel App, NP  metoprolol  tartrate (LOPRESSOR ) 25 MG tablet Take 0.5 tablets (12.5 mg total) by mouth 2 (two) times daily. 01/26/24   Gretel App, NP  omeprazole (PRILOSEC) 20 MG capsule Take 20 mg by mouth daily.    [provider]  potassium chloride  SA (KLOR-CON  M) 20 MEQ tablet TAKE 1 TABLET BY MOUTH EVERY DAY 06/26/24   Gretel App, NP  rosuvastatin  (CRESTOR ) 5 MG tablet TAKE 1 TABLET BY MOUTH EVERYDAY AT BEDTIME 04/03/24   Gretel App, NP  SKYRIZI 180 MG/1.2ML SOCT USE THE ON-BODY INJECTOR TO  ADMINISTER THE CONTENTS OF 1  CARTRIDGE SUBCUTANEOUSLY EVERY 8 WEEKS STARTING AT WEEK 12 10/11/23   Unk Corinn Skiff, MD     Family History  Problem Relation Age of  Onset   Stroke Mother    Hypertension Mother    Hyperlipidemia Mother    Hypertension Father    Crohn's disease Father    Cancer Sister        ovarian   Crohn's disease Sister    Crohn's disease Daughter    Ulcerative colitis Daughter    Cancer Paternal Aunt        Brain   Crohn's disease Paternal Aunt    Crohn's disease Paternal Uncle    Stroke Paternal Grandmother    Diabetes Neg Hx    Breast cancer Neg Hx     Social History   Socioeconomic History   Marital status: Divorced    Spouse name: Not on file   Number of children: 3   Years of education: Not on file   Highest education level: Bachelor's degree (e.g., BA, AB, BS)  Occupational History   Occupation: Nursing Tech    Comment: [employer]  Tobacco Use   Smoking status: Former    Current packs/day: 0.00    Average packs/day: 0.3 packs/day for 10.0 years (3.3 ttl pk-yrs)    Types: Cigarettes    Start date: 11/15/2006    Quit date: 11/14/2016    Years since quitting: 7.6   Smokeless tobacco: Never   Tobacco comments:    Quit within past yr  Vaping Use   Vaping status: Never Used  Substance and Sexual Activity   Alcohol use: Yes    Comment: occ   Drug use: No    Comment: extensive drug use, remotely   Sexual activity: Not Currently    Birth control/protection: Surgical, Post-menopausal  Other Topics Concern   Not on file  Social History Narrative   Not on file   Social Drivers of Health   Financial Resource Strain: Low Risk  (01/10/2024)   Overall Financial Resource Strain (CARDIA)    Difficulty of Paying Living Expenses: Not hard at all  Food Insecurity: No Food Insecurity (01/10/2024)   Hunger Vital Sign    Worried About Running Out of Food in the Last Year: Never true    Ran Out of Food in the Last Year: Never true  Transportation Needs: No Transportation Needs (01/10/2024)   PRAPARE - Administrator, Civil Service (Medical): No    Lack of Transportation (Non-Medical): No  Physical Activity:  Inactive (01/10/2024)   Exercise Vital Sign    Days of Exercise per Week: 0 days    Minutes of Exercise per Session: 0 min  Stress: Stress Concern Present (01/10/2024)   [employer] of Occupational Health - Occupational Stress Questionnaire    Feeling of Stress : To some extent  Social Connections: Moderately Isolated (01/10/2024)   Social Connection and Isolation Panel    Frequency of Communication with Friends and Family: More than three times a week    Frequency of Social Gatherings with Friends and Family: More than three times a week    Attends Religious Services: More than 4 times per year    Active Member of [employer] or Organizations: No    Attends Banker Meetings: Never    Marital Status: Divorced    Review of Systems: A 12 point ROS discussed and pertinent positives are indicated in the HPI above.  All other systems are negative.  Review of Systems  All other systems reviewed and are negative.   Vital Signs: BP (!) 126/59   Pulse (!) 51   Temp 98.4 F (36.9 C) (Oral)   Resp 18   Ht 5' 1 (1.549 m)   Wt 157 lb (71.2 kg)   SpO2 94%   BMI 29.66 kg/m   Physical Exam Constitutional:      General: She is not in acute distress.    Appearance: She is not ill-appearing.  HENT:     Mouth/Throat:     Mouth: Mucous membranes are moist.     Pharynx: Oropharynx is clear.  Cardiovascular:     Rate and Rhythm: Bradycardia present.  Pulmonary:     Effort: Pulmonary effort is normal.  Abdominal:     Tenderness: There is no abdominal tenderness.  Skin:    General: Skin is warm and dry.  Neurological:     Mental Status: She is alert and oriented to person, place, and time.     Imaging: No results found.  Labs:  CBC: Recent Labs    11/08/23 1102 02/14/24 1054 03/27/24 1056 07/01/24 1017  WBC 12.9* 8.7 7.6 8.3  HGB 12.1 11.2* 11.3* 11.6*  HCT 37.6 34.0* 34.5* 35.0*  PLT 175 162.0 175.0 159    COAGS: No results for input(s): INR, APTT in  the last 8760 hours.  BMP: Recent Labs    11/08/23 1102 11/27/23 0924 12/05/23 1540 12/22/23 1559 02/14/24 1054  NA 141 144  --  142 141  K 3.3* 2.9* 3.3* 3.8 4.1  CL 106 104  --  106 109  CO2 24 30  --  28 28  GLUCOSE 97 97  --  170* 93  BUN 6* 8  --  8 11  CALCIUM  9.0 8.8  --  8.6 9.0  CREATININE 0.79 0.79  --  0.74 0.88  GFRNONAA >60  --   --   --   --     LIVER FUNCTION TESTS: Recent Labs    02/14/24 1054 03/27/24 1056  BILITOT 0.9 0.8  AST 44* 49*  ALT 20 21  ALKPHOS 150* 139*  PROT 6.7 7.0  ALBUMIN 3.4* 3.5    TUMOR MARKERS: No results for input(s): AFPTM, CEA, CA199, CHROMGRNA in the last 8760 hours.  Assessment and Plan:  Elevated liver function tests: Sharon [name], 68 year old female, presents today for an image-guided non-focal liver biopsy.   Risks and benefits of this procedure were discussed with the patient and/or patient's family including, but not limited to bleeding, infection, damage to adjacent structures or low yield requiring additional tests.  All of the questions were answered and there is agreement to proceed. She has been NPO. She has not taken aspirin  in the past 7 days.   Consent signed and in chart.  Thank you for this interesting consult.  I greatly enjoyed meeting Sharon [name] and look forward to participating in their care.  A copy of this report was sent to the requesting provider on this date.  Electronically Signed: Warren Dais, AGACNP-BC 07/01/2024, 10:53 AM   I spent a total of  30 Minutes   in face to face in clinical consultation, greater than 50% of which was counseling/coordinating care for elevated liver function tests.

## 2024-07-01 NOTE — Procedures (Signed)
 Interventional Radiology Procedure Note  Procedure: US Guided Biopsy of liver  Complications: None  Estimated Blood Loss: < 10 mL  Findings: 18 G core biopsy of liver performed under US guidance.  Three core samples obtained and sent to Pathology.  Jodi Marble. Fredia Sorrow, M.D Pager:  [phone]

## 2024-07-12 DIAGNOSIS — F419 Anxiety disorder, unspecified: Secondary | ICD-10-CM | POA: Diagnosis not present

## 2024-07-12 DIAGNOSIS — F32A Depression, unspecified: Secondary | ICD-10-CM | POA: Diagnosis not present

## 2024-07-12 LAB — SURGICAL PATHOLOGY

## 2024-07-14 ENCOUNTER — Ambulatory Visit: Payer: Self-pay | Admitting: Gastroenterology

## 2024-07-16 ENCOUNTER — Encounter: Payer: Self-pay | Admitting: Gastroenterology

## 2024-07-30 ENCOUNTER — Ambulatory Visit

## 2024-08-01 ENCOUNTER — Ambulatory Visit: Admitting: Internal Medicine

## 2024-08-01 ENCOUNTER — Encounter: Payer: Self-pay | Admitting: Internal Medicine

## 2024-08-01 VITALS — BP 110/70 | HR 54 | Temp 98.1°F | Ht 61.0 in | Wt 151.2 lb

## 2024-08-01 DIAGNOSIS — G4733 Obstructive sleep apnea (adult) (pediatric): Secondary | ICD-10-CM | POA: Diagnosis not present

## 2024-08-01 DIAGNOSIS — Z6828 Body mass index (BMI) 28.0-28.9, adult: Secondary | ICD-10-CM | POA: Diagnosis not present

## 2024-08-01 DIAGNOSIS — Z87891 Personal history of nicotine dependence: Secondary | ICD-10-CM | POA: Diagnosis not present

## 2024-08-01 DIAGNOSIS — E669 Obesity, unspecified: Secondary | ICD-10-CM | POA: Diagnosis not present

## 2024-08-01 NOTE — Patient Instructions (Signed)

## 2024-08-01 NOTE — Progress Notes (Signed)
 Name: Sharon [name] MRN: [id number] DOB: 09-03-1955    CHIEF COMPLAINT:  Home sleep study February 2025 AHI between 9 and 17 Follow-up assessment for OSA Follow-up compliance review    HISTORY OF PRESENT ILLNESS:  Discussed sleep data and reviewed with patient.  Encouraged proper weight management.  Discussed sleep hygiene Patient uses and benefits from therapy Using CPAP nightly and with naps Settings are comfortable and is sleeping well. Great compliance review Auto CPAP 4-12 AHI reduced to 0.8   No exacerbation at this time No evidence of heart failure at this time No evidence or signs of infection at this time No respiratory distress No fevers, chills, nausea, vomiting, diarrhea No evidence of lower extremity edema No evidence hemoptysis   Chest x-ray reviewed 2022 No effusions no pneumonia No significant findings  PAST MEDICAL HISTORY :   has a past medical history of Anxiety and depression, Arthritis, B12 deficiency, Cervical cancer (HCC) (12/2008), Crohn's disease (HCC) (2000), Genital warts, GERD (gastroesophageal reflux disease), Hepatic steatosis (04/2011), History of anemia, History of chicken pox, History of syphilis (1980s), HTN (hypertension), Migraines, Scalp psoriasis, Sensorineural hearing loss of both ears (12/2012), Takotsubo syndrome (07/2012), TIA (transient ischemic attack) (05/2010), and Tobacco abuse.  has a past surgical history that includes Appendectomy (1998); Right colectomy (2000); US  ECHOCARDIOGRAPHY (05/2010); Colonoscopy (10/2008); Colonoscopy (05/2011); Esophagogastroduodenoscopy (05/2011); Cervical biopsy w/ loop electrode excision (10/2008); Vaginal hysterectomy (12/2008); Cardiac catheterization (07/2012); Colonoscopy (10/15/2012); Shoulder arthroscopy w/ rotator cuff repair (Right, 05/31/2012); left heart catheterization with coronary angiogram (N/A, 08/01/2012); Colonoscopy (09/2014); Colon resection (10/2014); Knee surgery (Left); LEFT  HEART CATH AND CORONARY ANGIOGRAPHY (Left, 04/15/2020); Colonoscopy with propofol  (N/A, 10/10/2022); Esophagogastroduodenoscopy (egd) with propofol  (N/A, 10/10/2022); and Colonoscopy (N/A, 06/06/2024). Prior to Admission medications   Medication Sig Start Date End Date Taking? Authorizing Provider  acetaminophen  (TYLENOL ) 500 MG tablet Take 1,000-1,500 mg by mouth 2 (two) times daily as needed for moderate pain or headache.   Yes [provider]  ARIPiprazole  (ABILIFY ) 5 MG tablet Take 1 tablet (5 mg total) by mouth at bedtime. 05/26/23  Yes Gretel App, NP  aspirin  EC 81 MG tablet Take 1 tablet (81 mg total) by mouth daily. Over the counter 05/01/16  Yes Rai, Ripudeep K, MD  budesonide  (ENTOCORT EC ) 3 MG 24 hr capsule Take 3 capsules (9 mg total) by mouth daily. 07/05/23  Yes Vanga, Corinn Skiff, MD  cyanocobalamin  (VITAMIN B12) 1000 MCG/ML injection Inject 1 mL (1,000 mcg total) into the muscle every 30 (thirty) days. 06/20/23  Yes Gretel App, NP  escitalopram  (LEXAPRO ) 20 MG tablet Take 1 tablet (20 mg total) by mouth daily. 05/26/23  Yes Gretel App, NP  Melatonin 10 MG CAPS Take 10 mg by mouth at bedtime as needed (Sleep).   Yes [provider]  metoprolol  tartrate (LOPRESSOR ) 25 MG tablet Take 1 tablet (25 mg total) by mouth 2 (two) times daily. 05/26/23  Yes Gretel App, NP  mirtazapine  (REMERON ) 7.5 MG tablet Take 1 tablet (7.5 mg total) by mouth at bedtime. 07/10/23  Yes Ezzard Staci SAILOR, NP  omeprazole (PRILOSEC) 20 MG capsule Take 20 mg by mouth daily.   Yes [provider]  oxycodone  (OXY-IR) 5 MG capsule Take 5 mg by mouth every 6 (six) hours. 05/11/23  Yes [provider]  ramelteon  (ROZEREM ) 8 MG tablet Take 1 tablet (8 mg total) by mouth at bedtime. 07/14/23  Yes Ezzard Staci SAILOR, NP  rosuvastatin  (CRESTOR ) 5 MG tablet Take 1 tablet (5 mg total)  by mouth at bedtime. 05/26/23  Yes Gretel App, NP   Allergies  Allergen Reactions   Infliximab  Dermatitis    Codeine Nausea And Vomiting   Flagyl  [Metronidazole ] Nausea And Vomiting   Hydrocodone  Nausea And Vomiting   Ibuprofen Other (See Comments)    Pt states that she is unable to take due to her Crohn's disease.   Remeron  [Mirtazapine ] Other (See Comments)    Reaction:  Makes pt lethargic     FAMILY HISTORY:  family history includes Cancer in her paternal aunt and sister; Crohn's disease in her daughter, father, paternal aunt, paternal uncle, and sister; Hyperlipidemia in her mother; Hypertension in her father and mother; Stroke in her mother and paternal grandmother; Ulcerative colitis in her daughter. SOCIAL HISTORY:  reports that she quit smoking about 7 years ago. Her smoking use included cigarettes. She started smoking about 17 years ago. She has a 3.3 pack-year smoking history. She has never used smokeless tobacco. She reports current alcohol use. She reports that she does not use drugs.   BP 110/70   Pulse (!) 54   Temp 98.1 F (36.7 C)   Ht 5' 1 (1.549 m)   Wt 151 lb 3.2 oz (68.6 kg)   SpO2 97%   BMI 28.57 kg/m      Physical Examination:  General Appearance: No distress  EYES EOM intact.   NECK Supple, No JVD Pulmonary: normal breath sounds, No wheezing.  CardiovascularNormal S1,S2.  No m/r/g.   Ext pulses intact, cap refill intact  ALL OTHER ROS ARE NEGATIVE     ASSESSMENT AND PLAN SYNOPSIS  68 year old pleasant white female with signs and symptoms of excessive daytime sleepiness with diagnosis of mild to moderate sleep apnea in the setting of obesity and deconditioned state AHI between 9 and 17   Assessment of OSA Previous AHI 9-17 Continue CPAP as prescribed  Excellent compliance report Reviewed compliance report in detail with patient Patient definitely benefits the use of CPAP therapy as prescribed Using CPAP nightly and with naps Pressure setting is comfortable and is sleeping well. CPAP prescription 4-12 AHI reduced to 0.8  No evidence of acute  heart failure at this time No respiratory distress No fevers, chills, nausea, vomiting, diarrhea No evidence hemoptysis  Patient Instructions Continue to use CPAP every night, minimum of 4-6 hours a night.  Change equipment every 30 days or as directed by DME.  Wash your tubing with warm soap and water daily, hang to dry.  Wash humidifier portion weekly. Use bottled, distilled water and change daily  Risk of untreated sleep apnea including cardiac arrhthymias, stroke, DM, pulm HTN.  Obesity -recommend significant weight loss -recommend changing diet  Deconditioned state -Recommend increased daily activity and exercise   MEDICATION ADJUSTMENTS/LABS AND TESTS ORDERED: Continue CPAP as prescribed  Patient  satisfied with Plan of action and management. All questions answered   Follow up 1 year   I spent a total of 45 minutes dedicated to the care of this patient on the date of this encounter to include pre-visit review of records, face-to-face time with the patient discussing conditions above, post visit ordering of testing, clinical documentation with the electronic health record, making appropriate referrals as documented, and communicating necessary information to the patient's healthcare team.    The Patient requires high complexity decision making for assessment and support, frequent evaluation and titration of therapies, application of advanced monitoring technologies and extensive interpretation of multiple databases.  Patient satisfied with Plan of action and management.  All questions answered    Nickolas Alm Cellar, M.D.  [facility] Pulmonary & Critical Care Medicine  Medical Director [facility]

## 2024-08-04 ENCOUNTER — Other Ambulatory Visit: Payer: Self-pay | Admitting: Nurse Practitioner

## 2024-08-04 DIAGNOSIS — I10 Essential (primary) hypertension: Secondary | ICD-10-CM

## 2024-08-07 ENCOUNTER — Emergency Department

## 2024-08-07 ENCOUNTER — Emergency Department: Admission: EM | Admit: 2024-08-07 | Discharge: 2024-08-07 | Disposition: A | Discharge status: Home / Self Care

## 2024-08-07 DIAGNOSIS — E860 Dehydration: Secondary | ICD-10-CM | POA: Diagnosis not present

## 2024-08-07 DIAGNOSIS — J101 Influenza due to other identified influenza virus with other respiratory manifestations: Secondary | ICD-10-CM | POA: Diagnosis not present

## 2024-08-07 DIAGNOSIS — M791 Myalgia, unspecified site: Secondary | ICD-10-CM | POA: Diagnosis present

## 2024-08-07 DIAGNOSIS — R059 Cough, unspecified: Secondary | ICD-10-CM | POA: Diagnosis not present

## 2024-08-07 LAB — CBC WITH DIFFERENTIAL/PLATELET
Abs Immature Granulocytes: 0.03 K/uL (ref 0.00–0.07)
Basophils Absolute: 0.1 K/uL (ref 0.0–0.1)
Basophils Relative: 1 %
Eosinophils Absolute: 0.1 K/uL (ref 0.0–0.5)
Eosinophils Relative: 1 %
HCT: 35.8 % — ABNORMAL LOW (ref 36.0–46.0)
Hemoglobin: 12 g/dL (ref 12.0–15.0)
Immature Granulocytes: 0 %
Lymphocytes Relative: 31 %
Lymphs Abs: 2.6 K/uL (ref 0.7–4.0)
MCH: 31.1 pg (ref 26.0–34.0)
MCHC: 33.5 g/dL (ref 30.0–36.0)
MCV: 92.7 fL (ref 80.0–100.0)
Monocytes Absolute: 0.7 K/uL (ref 0.1–1.0)
Monocytes Relative: 8 %
Neutro Abs: 5.1 K/uL (ref 1.7–7.7)
Neutrophils Relative %: 59 %
Platelets: 110 K/uL — ABNORMAL LOW (ref 150–400)
RBC: 3.86 MIL/uL — ABNORMAL LOW (ref 3.87–5.11)
RDW: 14 % (ref 11.5–15.5)
WBC: 8.6 K/uL (ref 4.0–10.5)
nRBC: 0 % (ref 0.0–0.2)

## 2024-08-07 LAB — COMPREHENSIVE METABOLIC PANEL WITH GFR
ALT: 18 U/L (ref 0–44)
AST: 52 U/L — ABNORMAL HIGH (ref 15–41)
Albumin: 3.4 g/dL — ABNORMAL LOW (ref 3.5–5.0)
Alkaline Phosphatase: 133 U/L — ABNORMAL HIGH (ref 38–126)
Anion gap: 12 (ref 5–15)
BUN: 11 mg/dL (ref 8–23)
CO2: 21 mmol/L — ABNORMAL LOW (ref 22–32)
Calcium: 8.6 mg/dL — ABNORMAL LOW (ref 8.9–10.3)
Chloride: 103 mmol/L (ref 98–111)
Creatinine, Ser: 1.35 mg/dL — ABNORMAL HIGH (ref 0.44–1.00)
GFR, Estimated: 43 mL/min — ABNORMAL LOW (ref >60)
Glucose, Bld: 94 mg/dL (ref 70–99)
Potassium: 4.3 mmol/L (ref 3.5–5.1)
Sodium: 136 mmol/L (ref 135–145)
Total Bilirubin: 0.8 mg/dL (ref 0.0–1.2)
Total Protein: 7.2 g/dL (ref 6.5–8.1)

## 2024-08-07 LAB — GROUP A STREP BY PCR: Group A Strep by PCR: NOT DETECTED

## 2024-08-07 LAB — RESP PANEL BY RT-PCR (RSV, FLU A&B, COVID)  RVPGX2
Influenza A by PCR: POSITIVE — AB
Influenza B by PCR: NEGATIVE
Resp Syncytial Virus by PCR: NEGATIVE
SARS Coronavirus 2 by RT PCR: NEGATIVE

## 2024-08-07 LAB — LACTIC ACID, PLASMA: Lactic Acid, Venous: 1.3 mmol/L (ref 0.5–1.9)

## 2024-08-07 MED ORDER — ACETAMINOPHEN 325 MG PO TABS
650.0000 mg | ORAL_TABLET | Freq: Once | ORAL | Status: AC
  Administered 2024-08-07: 650 mg via ORAL
  Filled 2024-08-07: qty 2

## 2024-08-07 MED ORDER — LACTATED RINGERS IV BOLUS
1000.0000 mL | Freq: Once | INTRAVENOUS | Status: AC
  Administered 2024-08-07: 1000 mL via INTRAVENOUS

## 2024-08-07 NOTE — ED Triage Notes (Signed)
 Pt presents to the ED from [facility] for hypotension. Pt states that she went to the walk in clinic for flu like sx's including: fever, chills, body aches, headache, congestion, fatigue, sore throat and coughing. Pt reports these sx's started Monday night. 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 Pt denies nausea vomiting. Reports diarrhea on Monday, but none since.  Pt states that she takes metoprolol , losartan  and amlodipine  for hypertension.   BP 71/51 at time of triage. IV started and fluids started at this time. Pt denies hx of CHF.

## 2024-08-07 NOTE — Discharge Instructions (Addendum)
 Please check your blood pressure prior to taking your blood pressure medications.  If your blood pressure is below 120/80 please hold your medications.

## 2024-08-07 NOTE — ED Provider Notes (Signed)
 [facility] Provider Note    Event Date/Time   First MD Initiated Contact with Patient 08/07/24 1211     (approximate)   History   Hypotension   HPI  Sharon [name] is a 68 y.o. female with a past medical of Crohn's disease, fatty liver disease, cirrhosis, presenting to the emergency department for hypotension.  The patient reports that for the last few days she has been having bodyaches, sweats and chills.  She reports that 2 days ago she had diarrhea that this has resolved.  She denies any nausea or vomiting.  States that she has not noticed any blood in her stool or any urinary symptoms.  Denies any chest pain or shortness of breath.  She reports that she went to urgent care today but was told to come to the emergency department because her blood pressure was low.  She did take all of her blood pressure medications this morning.     Physical Exam   Triage Vital Signs: ED Triage Vitals  Encounter Vitals Group     BP 08/07/24 1152 (!) 71/51     Girls Systolic BP Percentile --      Girls Diastolic BP Percentile --      Boys Systolic BP Percentile --      Boys Diastolic BP Percentile --      Pulse Rate 08/07/24 1152 (!) 59     Resp 08/07/24 1152 18     Temp 08/07/24 1152 98 F (36.7 C)     Temp src --      SpO2 08/07/24 1152 94 %     Weight 08/07/24 1154 146 lb (66.2 kg)     Height 08/07/24 1154 5' 1 (1.549 m)     Head Circumference --      Peak Flow --      Pain Score 08/07/24 1154 8     Pain Loc --      Pain Education --      Exclude from Growth Chart --     Most recent vital signs: Vitals:   08/07/24 1152  BP: (!) 71/51  Pulse: (!) 59  Resp: 18  Temp: 98 F (36.7 C)  SpO2: 94%     General: Awake, no distress.  CV:  Good peripheral perfusion.  Resp:  Normal effort.  Abd:  No distention.     ED Results / Procedures / Treatments   Labs (all labs ordered are listed, but only abnormal results are displayed) Labs Reviewed   COMPREHENSIVE METABOLIC PANEL WITH GFR - Abnormal; Notable for the following components:      Result Value   CO2 21 (*)    Creatinine, Ser 1.35 (*)    Calcium  8.6 (*)    Albumin 3.4 (*)    AST 52 (*)    Alkaline Phosphatase 133 (*)    GFR, Estimated 43 (*)    All other components within normal limits  CBC WITH DIFFERENTIAL/PLATELET - Abnormal; Notable for the following components:   RBC 3.86 (*)    HCT 35.8 (*)    Platelets 110 (*)    All other components within normal limits  RESP PANEL BY RT-PCR (RSV, FLU A&B, COVID)  [id number]  GROUP A STREP BY PCR  LACTIC ACID, PLASMA  LACTIC ACID, PLASMA  URINALYSIS, W/ REFLEX TO CULTURE (INFECTION SUSPECTED)     EKG     RADIOLOGY Chest x-ray by my interpretation does not show any acute pathology.  Radiologist interpretation  is in agreement.    PROCEDURES:  Critical Care performed:   CRITICAL CARE Performed by: Reche CHRISTELLA Leventhal   Total critical care time: 30 minutes  Critical care time was exclusive of separately billable procedures and treating other patients.  Critical care was necessary to treat or prevent imminent or life-threatening deterioration.  Critical care was time spent personally by me on the following activities: development of treatment plan with patient and/or surrogate as well as nursing, discussions with consultants, evaluation of patient's response to treatment, examination of patient, obtaining history from patient or surrogate, ordering and performing treatments and interventions, ordering and review of laboratory studies, ordering and review of radiographic studies, pulse oximetry and re-evaluation of patient's condition.   Procedures   MEDICATIONS ORDERED IN ED: Medications  lactated ringers  bolus 1,000 mL (has no administration in time range)  acetaminophen  (TYLENOL ) tablet 650 mg (has no administration in time range)     IMPRESSION / MDM / ASSESSMENT AND PLAN / ED COURSE  I reviewed the  triage vital signs and the nursing notes.                              Differential diagnosis includes, but is not limited to, sepsis, dehydration, gastroenteritis, viral illness  Patient's presentation is most consistent with acute complicated illness / injury requiring diagnostic workup.  Patient is a 68 year old female with past medical history of Crohn's disease, cirrhosis, fatty liver disease, presenting to the emergency department for hypotension.  On workup the patient is positive for influenza A.  She is also noted to have an AKI with a creatinine of 1.35 likely secondary to dehydration.  Patient received 500 cc of fluid in the waiting room and another liter here in the emergency department.  Her blood pressure is now 116/65 and she reports that she is feeling significantly improved.  Patient was able to stand up without any dizziness or lightheadedness.  Discussed with the patient discharge with instructions to drink plenty of fluids.  I advised her to check her blood pressure at home prior to taking her blood pressure medications and to hold these medications if her blood pressure is below 120/80.  She will follow-up with her primary care physician within the next few days.  Return to the emergency department for any new or worsening symptoms.      FINAL CLINICAL IMPRESSION(S) / ED DIAGNOSES   Final diagnoses:  Dehydration  Influenza A     Rx / DC Orders   ED Discharge Orders     None        Note:  This document was prepared using Dragon voice recognition software and may include unintentional dictation errors.   Leventhal Reche CHRISTELLA, MD 08/07/24 [phone]

## 2024-08-11 DIAGNOSIS — F32A Depression, unspecified: Secondary | ICD-10-CM | POA: Diagnosis not present

## 2024-08-11 DIAGNOSIS — G4733 Obstructive sleep apnea (adult) (pediatric): Secondary | ICD-10-CM | POA: Diagnosis not present

## 2024-08-11 DIAGNOSIS — F419 Anxiety disorder, unspecified: Secondary | ICD-10-CM | POA: Diagnosis not present

## 2024-08-27 ENCOUNTER — Encounter: Payer: Self-pay | Admitting: Nurse Practitioner

## 2024-08-27 ENCOUNTER — Ambulatory Visit (INDEPENDENT_AMBULATORY_CARE_PROVIDER_SITE_OTHER): Admitting: Nurse Practitioner

## 2024-08-27 VITALS — BP 100/62 | HR 51 | Temp 98.5°F | Ht 61.0 in | Wt 144.2 lb

## 2024-08-27 DIAGNOSIS — Z1231 Encounter for screening mammogram for malignant neoplasm of breast: Secondary | ICD-10-CM | POA: Diagnosis not present

## 2024-08-27 DIAGNOSIS — F419 Anxiety disorder, unspecified: Secondary | ICD-10-CM

## 2024-08-27 DIAGNOSIS — F32A Depression, unspecified: Secondary | ICD-10-CM | POA: Diagnosis not present

## 2024-08-27 DIAGNOSIS — K50919 Crohn's disease, unspecified, with unspecified complications: Secondary | ICD-10-CM | POA: Diagnosis not present

## 2024-08-27 DIAGNOSIS — E538 Deficiency of other specified B group vitamins: Secondary | ICD-10-CM | POA: Diagnosis not present

## 2024-08-27 DIAGNOSIS — I10 Essential (primary) hypertension: Secondary | ICD-10-CM | POA: Diagnosis not present

## 2024-08-27 DIAGNOSIS — K7460 Unspecified cirrhosis of liver: Secondary | ICD-10-CM | POA: Diagnosis not present

## 2024-08-27 DIAGNOSIS — E785 Hyperlipidemia, unspecified: Secondary | ICD-10-CM

## 2024-08-27 MED ORDER — CYANOCOBALAMIN 1000 MCG/ML IJ SOLN
1000.0000 ug | Freq: Once | INTRAMUSCULAR | Status: AC
  Administered 2024-08-27: 1000 ug via INTRAMUSCULAR

## 2024-08-27 MED ORDER — LOSARTAN POTASSIUM 50 MG PO TABS: 50.0000 mg | ORAL_TABLET | Freq: Every day | ORAL | 1 refills | Status: AC

## 2024-08-27 NOTE — Progress Notes (Signed)
 Pt presented for their vitamin B12 injection. Pt was identified through two identifiers. Pt tolerated shot well in their right deltoid.

## 2024-08-27 NOTE — Assessment & Plan Note (Signed)
 Managed with Crestor  5 mg daily. Continue.

## 2024-08-27 NOTE — Assessment & Plan Note (Signed)
 Managed with Skyrizi, which is effective with symptom improvement. Continue Skyrizi as prescribed. Follow up with GI as scheduled.

## 2024-08-27 NOTE — Assessment & Plan Note (Signed)
 Managed with Abilify  and Lexapro . Stable at this time. Continue current medication regimen. Follow up with psychiatry as scheduled.

## 2024-08-27 NOTE — Progress Notes (Signed)
 " Leron Glance, NP-C Phone: [phone]  Sharon [name] is a 68 y.o. female who presents today for follow up.   Discussed the use of AI scribe software for clinical note transcription with the patient, who gave verbal consent to proceed.  History of Present Illness   Sharon [name] is a 68 year old female with hypertension who presents with low blood pressure and dizziness.  She has experienced low blood pressure since contracting the flu three weeks ago, which led to an emergency room visit for dehydration. During that visit, she received fluids that raised her blood pressure to approximately 90/40 mmHg. Since then, she continues to experience lightheadedness and dizziness, with her blood pressure readings generally in the 90s and not exceeding 109 mmHg.  She is currently taking losartan  100 mg, which has been reduced to 50 mg. She also takes amlodipine  and metoprolol  for blood pressure management. No shortness of breath, chest pain, or swelling.  She is on Crestor  for cholesterol management and continues to see psychiatry for mood, anxiety, and depression, with no changes in her medications, which include escitalopram  and Abilify .  She has a history of Crohn's disease and cirrhosis of the liver, for which she is monitored by a gastroenterologist. She is scheduled to see her gastroenterologist next month. She continues to use Skyrizi for her Crohn's disease.      Tobacco Use History[1]  Medications Ordered Prior to Encounter[2]   ROS see history of present illness  Objective  Physical Exam Vitals:   08/27/24 1526  BP: 100/62  Pulse: (!) 51  Temp: 98.5 F (36.9 C)  SpO2: 97%    BP Readings from Last 3 Encounters:  08/27/24 100/62  08/07/24 116/61  08/01/24 110/70   Wt Readings from Last 3 Encounters:  08/27/24 144 lb 3.2 oz (65.4 kg)  08/07/24 146 lb (66.2 kg)  08/01/24 151 lb 3.2 oz (68.6 kg)    Physical Exam Constitutional:      General: She is not in acute  distress.    Appearance: Normal appearance.  HENT:     Head: Normocephalic.  Cardiovascular:     Rate and Rhythm: Normal rate and regular rhythm.     Heart sounds: Normal heart sounds.  Pulmonary:     Effort: Pulmonary effort is normal.     Breath sounds: Normal breath sounds.  Skin:    General: Skin is warm and dry.  Neurological:     General: No focal deficit present.     Mental Status: She is alert.  Psychiatric:        Mood and Affect: Mood normal.        Behavior: Behavior normal.      Assessment/Plan: Please see individual problem list.  Essential hypertension Assessment & Plan: Blood pressure improved after the flu but remains variable, low normal, with lightheadedness and dizziness. She is on losartan , amlodipine , and metoprolol . Reduce losartan  to 50 mg daily. Continue amlodipine  and metoprolol . Monitor blood pressure at home and report readings in 2-3 weeks. Check BMP. Return precautions given to patient.   Orders: -     Losartan  Potassium; Take 1 tablet (50 mg total) by mouth daily.  Dispense: 90 tablet; Refill: 1 -     Basic metabolic panel with GFR  Cirrhosis of liver without ascites, unspecified hepatic cirrhosis type [facility]) Assessment & Plan: Recently diagnosed. Managed by GI. Follow up scheduled for next month.    Crohn's disease with complication, unspecified gastrointestinal tract location [facility]) Assessment & Plan:  Managed with Skyrizi, which is effective with symptom improvement. Continue Skyrizi as prescribed. Follow up with GI as scheduled.    Anxiety and depression Assessment & Plan: Managed with Abilify  and Lexapro . Stable at this time. Continue current medication regimen. Follow up with psychiatry as scheduled.   Hyperlipidemia, unspecified hyperlipidemia type Assessment & Plan: Managed with Crestor  5 mg daily. Continue.   B12 deficiency -     Cyanocobalamin   Screening mammogram for breast cancer -     3D Screening Mammogram, Left and  Right; Future     Return in about 3 months (around 11/25/2024) for Follow up.   Leron Glance, NP-C [facility] [place]    [1]  Social History Tobacco Use  Smoking Status Former   Current packs/day: 0.00   Average packs/day: 0.3 packs/day for 10.0 years (3.3 ttl pk-yrs)   Types: Cigarettes   Start date: 11/15/2006   Quit date: 11/14/2016   Years since quitting: 7.7  Smokeless Tobacco Never  Tobacco Comments   Quit within past yr  [2]  Current Outpatient Medications on File Prior to Visit  Medication Sig Dispense Refill   acetaminophen  (TYLENOL ) 500 MG tablet Take 1,000-1,500 mg by mouth 2 (two) times daily as needed for moderate pain or headache.     amLODipine  (NORVASC ) 5 MG tablet TAKE 1 TABLET (5 MG TOTAL) BY MOUTH DAILY. 90 tablet 1   ARIPiprazole  (ABILIFY ) 5 MG tablet Take 1 tablet (5 mg total) by mouth at bedtime. 90 tablet 3   aspirin  EC 81 MG tablet Take 1 tablet (81 mg total) by mouth daily. Over the counter 30 tablet 3   cyanocobalamin  (VITAMIN B12) 1000 MCG/ML injection Inject 1 mL (1,000 mcg total) into the muscle every 30 (thirty) days. 1 mL 11   escitalopram  (LEXAPRO ) 20 MG tablet Take 1 tablet (20 mg total) by mouth daily. 90 tablet 3   gabapentin  (NEURONTIN ) 100 MG capsule Take 2 capsules (200 mg total) by mouth 2 (two) times daily. 120 capsule 2   meclizine  (ANTIVERT ) 12.5 MG tablet Take 1 tablet (12.5 mg total) by mouth 3 (three) times daily as needed for dizziness. 30 tablet 0   metoprolol  tartrate (LOPRESSOR ) 25 MG tablet TAKE 1 TABLET BY MOUTH TWICE A DAY 180 tablet 3   omeprazole (PRILOSEC) 20 MG capsule Take 20 mg by mouth daily.     potassium chloride  SA (KLOR-CON  M) 20 MEQ tablet TAKE 1 TABLET BY MOUTH EVERY DAY 90 tablet 1   rosuvastatin  (CRESTOR ) 5 MG tablet TAKE 1 TABLET BY MOUTH EVERYDAY AT BEDTIME 90 tablet 3   SKYRIZI 180 MG/1.2ML SOCT USE THE ON-BODY INJECTOR TO  ADMINISTER THE CONTENTS OF 1  CARTRIDGE SUBCUTANEOUSLY EVERY 8  WEEKS STARTING AT WEEK 12 1.2 mL 5   No current facility-administered medications on file prior to visit.   "

## 2024-08-27 NOTE — Patient Instructions (Signed)
 YOUR MAMMOGRAM IS DUE, PLEASE CALL AND GET THIS SCHEDULED! [facility] - call [phone]

## 2024-08-27 NOTE — Assessment & Plan Note (Signed)
 Recently diagnosed. Managed by GI. Follow up scheduled for next month.

## 2024-08-27 NOTE — Assessment & Plan Note (Signed)
 Blood pressure improved after the flu but remains variable, low normal, with lightheadedness and dizziness. She is on losartan , amlodipine , and metoprolol . Reduce losartan  to 50 mg daily. Continue amlodipine  and metoprolol . Monitor blood pressure at home and report readings in 2-3 weeks. Check BMP. Return precautions given to patient.

## 2024-08-28 LAB — BASIC METABOLIC PANEL WITH GFR
BUN: 19 mg/dL (ref 6–23)
CO2: 26 meq/L (ref 19–32)
Calcium: 8.3 mg/dL — ABNORMAL LOW (ref 8.4–10.5)
Chloride: 110 meq/L (ref 96–112)
Creatinine, Ser: 1.39 mg/dL — ABNORMAL HIGH (ref 0.40–1.20)
GFR: 39.47 mL/min — ABNORMAL LOW
Glucose, Bld: 92 mg/dL (ref 70–99)
Potassium: 4.8 meq/L (ref 3.5–5.1)
Sodium: 142 meq/L (ref 135–145)

## 2024-09-03 ENCOUNTER — Ambulatory Visit: Payer: Self-pay | Admitting: Nurse Practitioner

## 2024-09-03 DIAGNOSIS — R944 Abnormal results of kidney function studies: Secondary | ICD-10-CM

## 2024-09-04 ENCOUNTER — Other Ambulatory Visit (HOSPITAL_COMMUNITY): Payer: Self-pay | Admitting: Family

## 2024-09-05 ENCOUNTER — Other Ambulatory Visit

## 2024-09-05 DIAGNOSIS — R944 Abnormal results of kidney function studies: Secondary | ICD-10-CM | POA: Diagnosis not present

## 2024-09-06 LAB — BASIC METABOLIC PANEL WITH GFR
BUN: 18 mg/dL (ref 6–23)
CO2: 28 meq/L (ref 19–32)
Calcium: 8.7 mg/dL (ref 8.4–10.5)
Chloride: 107 meq/L (ref 96–112)
Creatinine, Ser: 1.09 mg/dL (ref 0.40–1.20)
GFR: 52.83 mL/min — ABNORMAL LOW
Glucose, Bld: 86 mg/dL (ref 70–99)
Potassium: 4.4 meq/L (ref 3.5–5.1)
Sodium: 141 meq/L (ref 135–145)

## 2024-09-09 ENCOUNTER — Other Ambulatory Visit (HOSPITAL_COMMUNITY): Payer: Self-pay | Admitting: *Deleted

## 2024-09-09 MED ORDER — GABAPENTIN 100 MG PO CAPS: 200.0000 mg | ORAL_CAPSULE | Freq: Two times a day (BID) | ORAL | 0 refills | Status: DC

## 2024-09-11 ENCOUNTER — Ambulatory Visit: Payer: Self-pay | Admitting: Nurse Practitioner

## 2024-09-16 ENCOUNTER — Telehealth (HOSPITAL_COMMUNITY): Admitting: Family

## 2024-09-16 DIAGNOSIS — F329 Major depressive disorder, single episode, unspecified: Secondary | ICD-10-CM | POA: Diagnosis not present

## 2024-09-16 DIAGNOSIS — F411 Generalized anxiety disorder: Secondary | ICD-10-CM | POA: Diagnosis not present

## 2024-09-16 DIAGNOSIS — F419 Anxiety disorder, unspecified: Secondary | ICD-10-CM

## 2024-09-16 MED ORDER — ARIPIPRAZOLE 5 MG PO TABS: 5.0000 mg | ORAL_TABLET | Freq: Every day | ORAL | 1 refills | Status: AC

## 2024-09-16 MED ORDER — ESCITALOPRAM OXALATE 20 MG PO TABS: 20.0000 mg | ORAL_TABLET | Freq: Every day | ORAL | 0 refills | Status: AC

## 2024-09-16 MED ORDER — GABAPENTIN 100 MG PO CAPS: 200.0000 mg | ORAL_CAPSULE | Freq: Two times a day (BID) | ORAL | 1 refills | Status: AC

## 2024-09-16 NOTE — Progress Notes (Unsigned)
 Virtual Visit via Telephone Note  I connected with Sharon [name] on 09/16/24 at  5:00 PM EST by telephone and verified that I am speaking with the correct person using two identifiers.  Location: Patient: Home Provider: Office   I discussed the limitations, risks, security and privacy concerns of performing an evaluation and management service by telephone and the availability of in person appointments. I also discussed with the patient that there may be a patient responsible charge related to this service. The patient expressed understanding and agreed to proceed.   I discussed the assessment and treatment plan with the patient. The patient was provided an opportunity to ask questions and all were answered. The patient agreed with the plan and demonstrated an understanding of the instructions.   The patient was advised to call back or seek an in-person evaluation if the symptoms worsen or if the condition fails to improve as anticipated.  I provided 15 minutes of non-face-to-face time during this encounter.   [name], NP     [facility] MD/PA/NP OP Progress Note  09/16/2024 1:30 PM [name]  MRN:  [id number]  Chief Complaint: Sharon [name] stated  I get lonely especially living by myself.  HPI: Sharon [name] 69 year old female presents for medication management follow-up appointment.  Carries a diagnosis related to major depressive disorder, sleep disturbance and generalized anxiety disorder seen and evaluated via telephonically platform.   Signa is currently prescribed Lexapro  20 mg daily, Abilify  5 mg daily and gabapentin  200 mg p.o. twice daily which she reports taking and tolerating well.  Dystany states overall, her mood has been stable she continues to struggle with depression symptoms.  Rating her depression 5 out of 10 with 10 being the worst.  She attributes sadness and depression to loneliness.  States on her off days she has a difficult time with ADLs and self motivating.   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  She denied that she is currently followed by therapy services.  Recommending following up with individual therapy she appeared amendable to plan.  Will continue to monitor symptoms.  Patient to follow-up 3 months for medication adherence/tolerability.  Support and encouragement reassurance was provided.   Visit Diagnosis:    ICD-10-CM   1. Anxiety and depression  F41.9 ARIPiprazole  (ABILIFY ) 5 MG tablet   F32.A escitalopram  (LEXAPRO ) 20 MG tablet      Past Psychiatric History: H/O major depressive disorder, generalized anxiety disorder and sleep disturbance 1 previous inpatient admission at the age of 15 due to overdose.  Past Medical History:  Past Medical History:  Diagnosis Date   Anxiety and depression    Arthritis    B12 deficiency    pernicious anemia - monthly b12 shots   Cervical cancer (HCC) 12/2008   s/p hysterectomy   Crohn's disease (HCC) 2000   h/o stenotic crohn's ileitis, active in TI s/p resections 2000, 2016 PPD neg ([facility] Dr. Celestia); has been on cimzia, remicade , , entocort, now stable on Entyvio  ([facility])   Genital warts    GERD (gastroesophageal reflux disease)    severe, daily sxs if off PPI   Hepatic steatosis 04/2011   diffuse on CT, 5mm gallbladder polyp   History of anemia    attributed to crohn's   History of chicken pox    History of  syphilis 1980s   HTN (hypertension)    Migraines    and frequent other headaches (sinus or stress)   Scalp psoriasis    Sensorineural hearing loss of both ears 12/2012   high freq   Takotsubo syndrome 07/2012   cath 08/01/12, normal coronaries, LVEF 65%   TIA (transient ischemic attack) 05/2010   at [facility] - TIA vs complex migraine (w/u negative - carotids, echo, TC doppler, and MRI  normal)   Tobacco abuse     Past Surgical History:  Procedure Laterality Date   APPENDECTOMY  1998   CARDIAC CATHETERIZATION  07/2012   normal LV fxn, widely patent coronaries   CERVICAL BIOPSY  W/ LOOP ELECTRODE EXCISION  10/2008   COLON RESECTION  10/2014   removal of scar tissue colon from prior surgery ([facility] at [facility])   COLONOSCOPY  10/2008   ileo-colonic anastomosis, ielocolonic crohn's   COLONOSCOPY  05/2011   ileo-colonic anastomosis normal, normal mucosa   COLONOSCOPY  10/15/2012   focal inflammation at anastomosis, no active crohn's, planning MR enterograph Robertha)   COLONOSCOPY  09/2014   [facility]   COLONOSCOPY N/A 06/06/2024   Procedure: COLONOSCOPY;  Surgeon: Unk Corinn Skiff, MD;  Location: [facility] ENDOSCOPY;  Service: Gastroenterology;  Laterality: N/A;   COLONOSCOPY WITH PROPOFOL  N/A 10/10/2022   Procedure: COLONOSCOPY WITH PROPOFOL ;  Surgeon: Unk Corinn Skiff, MD;  Location: [facility] ENDOSCOPY;  Service: Gastroenterology;  Laterality: N/A;   ESOPHAGOGASTRODUODENOSCOPY  05/2011   nl esophagus, gastritis, nl duodenum   ESOPHAGOGASTRODUODENOSCOPY (EGD) WITH PROPOFOL  N/A 10/10/2022   Procedure: ESOPHAGOGASTRODUODENOSCOPY (EGD) WITH PROPOFOL ;  Surgeon: Unk Corinn Skiff, MD;  Location: [facility] ENDOSCOPY;  Service: Gastroenterology;  Laterality: N/A;   KNEE SURGERY Left    LEFT HEART CATH AND CORONARY ANGIOGRAPHY Left 04/15/2020   Procedure: LEFT HEART CATH AND CORONARY ANGIOGRAPHY;  Surgeon: Florencio Cara BIRCH, MD;  Location: [facility] INVASIVE CV LAB;  Service: Cardiovascular;  Laterality: Left;   LEFT HEART CATHETERIZATION WITH CORONARY ANGIOGRAM N/A 08/01/2012   Procedure: LEFT HEART CATHETERIZATION WITH CORONARY ANGIOGRAM;  Surgeon: Ozell Fell, MD;  Location: [facility] CATH LAB;  Service: Cardiovascular;  Laterality: N/A;   RIGHT COLECTOMY  2000   crohn's disease, ileocecal resection   SHOULDER ARTHROSCOPY W/ ROTATOR CUFF REPAIR Right 05/31/2012   [facility])    US  ECHOCARDIOGRAPHY  05/2010   nl LV fxn ,EF 60%   VAGINAL HYSTERECTOMY  12/2008   LAVH/BSO    Family Psychiatric History:   Family History:  Family History  Problem Relation Age of Onset   Stroke Mother    Hypertension Mother    Hyperlipidemia Mother    Hypertension Father    Crohn's disease Father    Cancer Sister        ovarian   Crohn's disease Sister    Crohn's disease Daughter    Ulcerative colitis Daughter    Cancer Paternal Aunt        Brain   Crohn's disease Paternal Aunt    Crohn's disease Paternal Uncle    Stroke Paternal Grandmother    Diabetes Neg Hx    Breast cancer Neg Hx     Social History:  Social History   Socioeconomic History   Marital status: Divorced    Spouse name: Not on file   Number of children: 3   Years of education: Not on file   Highest education level: Bachelor's degree (e.g., BA, AB, BS)  Occupational History   Occupation: Nursing Tech    Comment: [employer]  Tobacco Use   Smoking status: Former    Current packs/day: 0.00    Average packs/day: 0.3 packs/day for 10.0 years (3.3 ttl pk-yrs)    Types: Cigarettes    Start date: 11/15/2006    Quit date: 11/14/2016    Years since quitting: 7.8   Smokeless tobacco: Never   Tobacco comments:    Quit within past yr  Vaping Use   Vaping status: Never Used  Substance and Sexual Activity   Alcohol use: Yes    Comment: occ   Drug use: No    Comment: extensive drug use, remotely   Sexual activity: Not Currently    Birth control/protection: Surgical, Post-menopausal  Other Topics Concern   Not on file  Social History Narrative   Not on file   Social Drivers of Health   Tobacco Use: Medium Risk (08/27/2024)   Patient History    Smoking Tobacco Use: Former    Smokeless Tobacco Use: Never    Passive Exposure: Not on Actuary Strain: Low Risk (01/10/2024)   Overall Financial Resource Strain (CARDIA)    Difficulty of Paying Living Expenses: Not hard at all  Food  Insecurity: No Food Insecurity (01/10/2024)   Hunger Vital Sign    Worried About Running Out of Food in the Last Year: Never true    Ran Out of Food in the Last Year: Never true  Transportation Needs: No Transportation Needs (01/10/2024)   PRAPARE - Administrator, Civil Service (Medical): No    Lack of Transportation (Non-Medical): No  Physical Activity: Inactive (01/10/2024)   Exercise Vital Sign    Days of Exercise per Week: 0 days    Minutes of Exercise per Session: 0 min  Stress: Stress Concern Present (01/10/2024)   [employer] of Occupational Health - Occupational Stress Questionnaire    Feeling of Stress : To some extent  Social Connections: Moderately Isolated (01/10/2024)   Social Connection and Isolation Panel    Frequency of Communication with Friends and Family: More than three times a week    Frequency of Social Gatherings with Friends and Family: More than three times a week    Attends Religious Services: More than 4 times per year    Active Member of Clubs or Organizations: No    Attends Banker Meetings: Never    Marital Status: Divorced  Depression (PHQ2-9): Medium Risk (08/27/2024)   Depression (PHQ2-9)    PHQ-2 Score: 6  Alcohol Screen: Low Risk (01/10/2024)   Alcohol Screen    Last Alcohol Screening Score (AUDIT): 0  Housing: Unknown (05/23/2024)   [facility]   Epic    Unable to Pay for Housing in the Last Year: Not on file    Number of Times Moved in the Last Year: Not on file    At any time in the past 12 months, were you homeless or living in a shelter (including now)?: No  Utilities: Not At Risk (01/10/2024)   AHC Utilities    Threatened with loss of utilities: No  Health Literacy: Adequate Health Literacy (01/10/2024)   B1300 Health Literacy    Frequency of need for help with medical instructions: Never    Allergies: Allergies[1]  Metabolic Disorder Labs: Lab Results  Component Value Date    HGBA1C 5.5 05/26/2023   MPG 108 08/01/2012   MPG 103 06/09/2010   No results found for: PROLACTIN Lab Results  Component Value Date   CHOL 103  02/14/2024   TRIG 76.0 02/14/2024   HDL 50.40 02/14/2024   CHOLHDL 2 02/14/2024   VLDL 15.2 02/14/2024   LDLCALC 37 02/14/2024   LDLCALC 64 05/26/2023   Lab Results  Component Value Date   TSH 2.36 02/14/2024   TSH 1.64 05/26/2023    Therapeutic Level Labs: No results found for: LITHIUM No results found for: VALPROATE No results found for: CBMZ  Current Medications: Current Outpatient Medications  Medication Sig Dispense Refill   acetaminophen  (TYLENOL ) 500 MG tablet Take 1,000-1,500 mg by mouth 2 (two) times daily as needed for moderate pain or headache.     amLODipine  (NORVASC ) 5 MG tablet TAKE 1 TABLET (5 MG TOTAL) BY MOUTH DAILY. 90 tablet 1   ARIPiprazole  (ABILIFY ) 5 MG tablet Take 1 tablet (5 mg total) by mouth at bedtime. 90 tablet 1   aspirin  EC 81 MG tablet Take 1 tablet (81 mg total) by mouth daily. Over the counter 30 tablet 3   cyanocobalamin  (VITAMIN B12) 1000 MCG/ML injection Inject 1 mL (1,000 mcg total) into the muscle every 30 (thirty) days. 1 mL 11   escitalopram  (LEXAPRO ) 20 MG tablet Take 1 tablet (20 mg total) by mouth daily. 90 tablet 0   gabapentin  (NEURONTIN ) 100 MG capsule Take 2 capsules (200 mg total) by mouth 2 (two) times daily. 360 capsule 1   losartan  (COZAAR ) 50 MG tablet Take 1 tablet (50 mg total) by mouth daily. 90 tablet 1   meclizine  (ANTIVERT ) 12.5 MG tablet Take 1 tablet (12.5 mg total) by mouth 3 (three) times daily as needed for dizziness. 30 tablet 0   metoprolol  tartrate (LOPRESSOR ) 25 MG tablet TAKE 1 TABLET BY MOUTH TWICE A DAY 180 tablet 3   omeprazole (PRILOSEC) 20 MG capsule Take 20 mg by mouth daily.     potassium chloride  SA (KLOR-CON  M) 20 MEQ tablet TAKE 1 TABLET BY MOUTH EVERY DAY 90 tablet 1   rosuvastatin  (CRESTOR ) 5 MG tablet TAKE 1 TABLET BY MOUTH EVERYDAY AT BEDTIME 90  tablet 3   SKYRIZI 180 MG/1.2ML SOCT USE THE ON-BODY INJECTOR TO  ADMINISTER THE CONTENTS OF 1  CARTRIDGE SUBCUTANEOUSLY EVERY 8 WEEKS STARTING AT WEEK 12 1.2 mL 5   No current facility-administered medications for this visit.     Musculoskeletal: Telephonic assessment  Psychiatric Specialty Exam: Review of Systems  There were no vitals taken for this visit.There is no height or weight on file to calculate BMI.  General Appearance: NA  Eye Contact:  NA  Speech:  Clear and Coherent  Volume:  Normal  Mood:  Depressed  Affect:  NA  Thought Process:  Coherent  Orientation:  Full (Time, Place, and Person)  Thought Content: Logical   Suicidal Thoughts:  No  Homicidal Thoughts:  No  Memory:  Immediate;   Good Recent;   Good  Judgement:  Fair  Insight:  Good  Psychomotor Activity:  Normal  Concentration:  Concentration: Good  Recall:  Good  Fund of Knowledge: Good  Language: Good  Akathisia:  No  Handed:  Right  AIMS (if indicated): not done  Assets:  Communication Skills Desire for Improvement  ADL's:  Impaired  Cognition: WNL  Sleep:  Fair   Screenings: GAD-7    Garment/textile Technologist Visit from 08/27/2024 in [facility] Visit from 02/14/2024 in [facility] Visit from 06/23/2023 in [facility] Visit from 05[facility]26/2023  in [facility] Visit from 07/19/2022 in [facility]  Total GAD-7 Score 7 11 6 12 4    PHQ2-9    Flowsheet Row Office Visit from 08/27/2024 in [facility] Visit from 02/14/2024 in [facility] [employer] Clinical Support from 01/10/2024 in [facility] Visit from 07/10/2023 [facility] Visit from 06/23/2023 in [facility] Total Score 1 1 0 2 0  PHQ-9 Total Score 6 4 1 8 6    Flowsheet Row ED from 08/07/2024 in [facility] at [facility]  BIOPSY from 07/01/2024 in [facility] from 11/08/2023 in [facility] Emergency Department at [facility]  C-SSRS RISK CATEGORY No Risk No Risk No Risk     Assessment and Plan:  Sharon [name] is a 69 year old female who presents for medication management follow-up appointment.  Carries a diagnosis related to major depressive disorder generalized anxiety disorder, currently she is prescribed Abilify , gabapentin , Lexapro  for mood stabilization which she reports she has been taking and tolerating well.  Reports feeling depressed due to loneliness as she states she resides alone.  Reports a pretty good support system as she is talks to her sister often.  Discussed consideration for PHP/IOP for daily support structure however states she is employed and will not be able to attend Monday through Friday sessions.  Discussed consideration for therapy services.  She was amendable to plan.  Will make medication refills available.  Patient to follow-up 2 to 3 months for medication adherence/tolerability.  Collaboration of Care: Collaboration of Care: Referral or follow-up with counselor/therapist AEB established with therapy services  Patient/Guardian was advised Release of Information must be obtained prior to any record release in order to collaborate their care with an outside provider. Patient/Guardian was advised if they have not already done so to contact the registration department to sign all necessary forms in order for us  to release information regarding their care.   Consent: Patient/Guardian gives verbal consent for treatment and assignment of benefits for services provided during this visit. Patient/Guardian  expressed understanding and agreed to proceed.    [name], NP 09/16/2024, 1:30 PM     [1]  Allergies Allergen Reactions   Infliximab  Dermatitis   Codeine Nausea And Vomiting   Flagyl  [Metronidazole ] Nausea And Vomiting   Hydrocodone  Nausea And Vomiting   Ibuprofen Other (See Comments)    Pt states that she is unable to take due to her Crohn's disease.   Remeron  [Mirtazapine ] Other (See Comments)    Reaction:  Makes pt lethargic

## 2024-09-18 ENCOUNTER — Other Ambulatory Visit: Payer: Self-pay | Admitting: Gastroenterology

## 2024-09-18 DIAGNOSIS — R634 Abnormal weight loss: Secondary | ICD-10-CM

## 2024-09-30 ENCOUNTER — Ambulatory Visit

## 2024-10-02 ENCOUNTER — Ambulatory Visit

## 2024-10-07 ENCOUNTER — Ambulatory Visit

## 2024-10-08 ENCOUNTER — Ambulatory Visit

## 2024-10-23 ENCOUNTER — Ambulatory Visit (HOSPITAL_COMMUNITY)

## 2024-11-26 ENCOUNTER — Ambulatory Visit: Admitting: Nurse Practitioner

## 2024-12-16 ENCOUNTER — Telehealth (HOSPITAL_COMMUNITY): Admitting: Family

## 2025-01-14 ENCOUNTER — Ambulatory Visit

## 2025-01-15 ENCOUNTER — Ambulatory Visit (HOSPITAL_COMMUNITY)
